# Patient Record
Sex: Female | Born: 1955
Health system: Southern US, Community
[De-identification: ages and names within clinical notes are randomized; demographics above are authoritative.]

## PROBLEM LIST (undated history)

## (undated) DIAGNOSIS — G47419 Narcolepsy without cataplexy: Secondary | ICD-10-CM

## (undated) DIAGNOSIS — D259 Leiomyoma of uterus, unspecified: Secondary | ICD-10-CM

## (undated) DIAGNOSIS — I5032 Chronic diastolic (congestive) heart failure: Secondary | ICD-10-CM

## (undated) DIAGNOSIS — M1990 Unspecified osteoarthritis, unspecified site: Secondary | ICD-10-CM

## (undated) DIAGNOSIS — K219 Gastro-esophageal reflux disease without esophagitis: Secondary | ICD-10-CM

## (undated) DIAGNOSIS — E785 Hyperlipidemia, unspecified: Secondary | ICD-10-CM

## (undated) DIAGNOSIS — J40 Bronchitis, not specified as acute or chronic: Secondary | ICD-10-CM

## (undated) DIAGNOSIS — R1032 Left lower quadrant pain: Secondary | ICD-10-CM

## (undated) DIAGNOSIS — I10 Essential (primary) hypertension: Secondary | ICD-10-CM

## (undated) DIAGNOSIS — J302 Other seasonal allergic rhinitis: Secondary | ICD-10-CM

## (undated) DIAGNOSIS — I493 Ventricular premature depolarization: Secondary | ICD-10-CM

## (undated) DIAGNOSIS — I491 Atrial premature depolarization: Secondary | ICD-10-CM

## (undated) DIAGNOSIS — R4189 Other symptoms and signs involving cognitive functions and awareness: Secondary | ICD-10-CM

## (undated) DIAGNOSIS — F419 Anxiety disorder, unspecified: Secondary | ICD-10-CM

## (undated) DIAGNOSIS — M8580 Other specified disorders of bone density and structure, unspecified site: Secondary | ICD-10-CM

## (undated) DIAGNOSIS — IMO0002 Reserved for concepts with insufficient information to code with codable children: Secondary | ICD-10-CM

## (undated) HISTORY — DX: Left lower quadrant pain: R10.32

## (undated) HISTORY — DX: Other symptoms and signs involving cognitive functions and awareness: R41.89

## (undated) HISTORY — DX: Ventricular premature depolarization: I49.3

## (undated) HISTORY — DX: Hyperlipidemia, unspecified: E78.5

## (undated) HISTORY — DX: Essential (primary) hypertension: I10

## (undated) HISTORY — DX: Narcolepsy without cataplexy: G47.419

## (undated) HISTORY — PX: APPENDECTOMY: SHX54

## (undated) HISTORY — DX: Atrial premature depolarization: I49.1

## (undated) HISTORY — PX: COLONOSCOPY: SHX174

## (undated) HISTORY — DX: Reserved for concepts with insufficient information to code with codable children: IMO0002

## (undated) HISTORY — PX: WISDOM TOOTH EXTRACTION: SHX21

## (undated) HISTORY — DX: Leiomyoma of uterus, unspecified: D25.9

## (undated) HISTORY — PX: TUBAL LIGATION: SHX77

---
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
## 1998-01-12 ENCOUNTER — Encounter: Admission: RE | Admit: 1998-01-12 | Discharge: 1998-01-12 | Payer: Self-pay | Admitting: Family Medicine

## 1998-04-29 ENCOUNTER — Encounter: Admission: RE | Admit: 1998-04-29 | Discharge: 1998-04-29 | Payer: Self-pay | Admitting: Family Medicine

## 1999-06-06 ENCOUNTER — Encounter: Admission: RE | Admit: 1999-06-06 | Discharge: 1999-06-06 | Payer: Self-pay | Admitting: Sports Medicine

## 2000-01-24 ENCOUNTER — Encounter: Admission: RE | Admit: 2000-01-24 | Discharge: 2000-01-24 | Payer: Self-pay | Admitting: Family Medicine

## 2000-02-08 ENCOUNTER — Encounter: Admission: RE | Admit: 2000-02-08 | Discharge: 2000-02-08 | Payer: Self-pay | Admitting: Family Medicine

## 2000-02-16 ENCOUNTER — Encounter: Admission: RE | Admit: 2000-02-16 | Discharge: 2000-05-16 | Payer: Self-pay | Admitting: *Deleted

## 2000-05-22 ENCOUNTER — Encounter: Admission: RE | Admit: 2000-05-22 | Discharge: 2000-05-22 | Payer: Self-pay | Admitting: Family Medicine

## 2000-07-03 ENCOUNTER — Encounter: Payer: Self-pay | Admitting: Sports Medicine

## 2000-07-03 ENCOUNTER — Encounter: Admission: RE | Admit: 2000-07-03 | Discharge: 2000-07-03 | Payer: Self-pay | Admitting: Sports Medicine

## 2000-12-14 ENCOUNTER — Emergency Department (HOSPITAL_COMMUNITY): Admission: EM | Admit: 2000-12-14 | Discharge: 2000-12-14 | Payer: Self-pay | Admitting: *Deleted

## 2000-12-26 ENCOUNTER — Emergency Department (HOSPITAL_COMMUNITY): Admission: EM | Admit: 2000-12-26 | Discharge: 2000-12-26 | Payer: Self-pay | Admitting: Emergency Medicine

## 2001-01-08 ENCOUNTER — Encounter: Admission: RE | Admit: 2001-01-08 | Discharge: 2001-01-08 | Payer: Self-pay | Admitting: Family Medicine

## 2001-01-22 ENCOUNTER — Encounter: Admission: RE | Admit: 2001-01-22 | Discharge: 2001-01-22 | Payer: Self-pay | Admitting: Family Medicine

## 2001-07-22 ENCOUNTER — Encounter: Admission: RE | Admit: 2001-07-22 | Discharge: 2001-07-22 | Payer: Self-pay | Admitting: Family Medicine

## 2003-11-05 ENCOUNTER — Emergency Department (HOSPITAL_COMMUNITY): Admission: EM | Admit: 2003-11-05 | Discharge: 2003-11-05 | Payer: Self-pay | Admitting: Emergency Medicine

## 2003-11-09 ENCOUNTER — Encounter: Admission: RE | Admit: 2003-11-09 | Discharge: 2003-11-09 | Payer: Self-pay | Admitting: Family Medicine

## 2003-11-11 ENCOUNTER — Encounter: Admission: RE | Admit: 2003-11-11 | Discharge: 2003-11-11 | Payer: Self-pay | Admitting: Family Medicine

## 2003-11-11 ENCOUNTER — Ambulatory Visit (HOSPITAL_COMMUNITY): Admission: RE | Admit: 2003-11-11 | Discharge: 2003-11-11 | Payer: Self-pay | Admitting: Family Medicine

## 2003-11-22 ENCOUNTER — Encounter: Admission: RE | Admit: 2003-11-22 | Discharge: 2003-11-22 | Payer: Self-pay | Admitting: Family Medicine

## 2003-12-20 ENCOUNTER — Emergency Department (HOSPITAL_COMMUNITY): Admission: EM | Admit: 2003-12-20 | Discharge: 2003-12-20 | Payer: Self-pay | Admitting: Family Medicine

## 2003-12-21 ENCOUNTER — Encounter: Admission: RE | Admit: 2003-12-21 | Discharge: 2003-12-21 | Payer: Self-pay | Admitting: Sports Medicine

## 2004-04-13 ENCOUNTER — Encounter: Admission: RE | Admit: 2004-04-13 | Discharge: 2004-04-13 | Payer: Self-pay | Admitting: Sports Medicine

## 2005-07-02 ENCOUNTER — Ambulatory Visit: Payer: Self-pay | Admitting: Family Medicine

## 2005-07-02 ENCOUNTER — Encounter (INDEPENDENT_AMBULATORY_CARE_PROVIDER_SITE_OTHER): Payer: Self-pay | Admitting: *Deleted

## 2005-07-26 ENCOUNTER — Encounter: Admission: RE | Admit: 2005-07-26 | Discharge: 2005-07-26 | Payer: Self-pay | Admitting: Sports Medicine

## 2005-08-13 ENCOUNTER — Ambulatory Visit: Payer: Self-pay | Admitting: Psychology

## 2005-08-13 ENCOUNTER — Encounter: Admission: RE | Admit: 2005-08-13 | Discharge: 2005-11-11 | Payer: Self-pay | Admitting: Family Medicine

## 2005-09-06 ENCOUNTER — Ambulatory Visit: Payer: Self-pay | Admitting: Family Medicine

## 2005-10-03 ENCOUNTER — Ambulatory Visit: Payer: Self-pay | Admitting: Family Medicine

## 2005-12-25 ENCOUNTER — Encounter (INDEPENDENT_AMBULATORY_CARE_PROVIDER_SITE_OTHER): Payer: Self-pay | Admitting: *Deleted

## 2006-01-11 ENCOUNTER — Ambulatory Visit: Payer: Self-pay | Admitting: Family Medicine

## 2006-01-11 ENCOUNTER — Encounter (INDEPENDENT_AMBULATORY_CARE_PROVIDER_SITE_OTHER): Payer: Self-pay | Admitting: Specialist

## 2006-08-01 ENCOUNTER — Ambulatory Visit (HOSPITAL_COMMUNITY): Admission: RE | Admit: 2006-08-01 | Discharge: 2006-08-01 | Payer: Self-pay | Admitting: Family Medicine

## 2006-08-01 ENCOUNTER — Ambulatory Visit: Payer: Self-pay | Admitting: Family Medicine

## 2006-08-08 ENCOUNTER — Encounter: Admission: RE | Admit: 2006-08-08 | Discharge: 2006-08-08 | Payer: Self-pay | Admitting: Family Medicine

## 2006-09-02 ENCOUNTER — Ambulatory Visit: Payer: Self-pay | Admitting: Sports Medicine

## 2006-09-10 ENCOUNTER — Ambulatory Visit: Payer: Self-pay | Admitting: Family Medicine

## 2006-10-11 ENCOUNTER — Emergency Department (HOSPITAL_COMMUNITY): Admission: EM | Admit: 2006-10-11 | Discharge: 2006-10-12 | Payer: Self-pay | Admitting: Emergency Medicine

## 2006-10-12 ENCOUNTER — Ambulatory Visit (HOSPITAL_COMMUNITY): Admission: RE | Admit: 2006-10-12 | Discharge: 2006-10-12 | Payer: Self-pay | Admitting: Emergency Medicine

## 2006-10-12 ENCOUNTER — Ambulatory Visit: Payer: Self-pay | Admitting: Vascular Surgery

## 2006-10-14 ENCOUNTER — Ambulatory Visit: Payer: Self-pay | Admitting: Family Medicine

## 2006-10-25 ENCOUNTER — Encounter (INDEPENDENT_AMBULATORY_CARE_PROVIDER_SITE_OTHER): Payer: Self-pay | Admitting: *Deleted

## 2007-01-21 ENCOUNTER — Ambulatory Visit: Payer: Self-pay | Admitting: Sports Medicine

## 2007-01-23 ENCOUNTER — Ambulatory Visit: Payer: Self-pay | Admitting: Family Medicine

## 2007-06-16 ENCOUNTER — Telehealth: Payer: Self-pay | Admitting: *Deleted

## 2007-06-18 ENCOUNTER — Ambulatory Visit: Payer: Self-pay | Admitting: Family Medicine

## 2007-06-19 ENCOUNTER — Ambulatory Visit: Payer: Self-pay | Admitting: *Deleted

## 2007-08-29 ENCOUNTER — Encounter: Payer: Self-pay | Admitting: Family Medicine

## 2007-09-05 ENCOUNTER — Encounter: Payer: Self-pay | Admitting: *Deleted

## 2007-09-25 ENCOUNTER — Ambulatory Visit: Payer: Self-pay | Admitting: Internal Medicine

## 2007-09-25 ENCOUNTER — Encounter: Payer: Self-pay | Admitting: Family Medicine

## 2007-09-25 DIAGNOSIS — I10 Essential (primary) hypertension: Secondary | ICD-10-CM | POA: Insufficient documentation

## 2007-09-25 DIAGNOSIS — F70 Mild intellectual disabilities: Secondary | ICD-10-CM | POA: Insufficient documentation

## 2007-09-29 LAB — CONVERTED CEMR LAB
BUN: 21 mg/dL (ref 6–23)
Chloride: 101 meq/L (ref 96–112)
Potassium: 3 meq/L — ABNORMAL LOW (ref 3.5–5.3)

## 2007-10-10 ENCOUNTER — Encounter: Payer: Self-pay | Admitting: Family Medicine

## 2007-10-10 ENCOUNTER — Ambulatory Visit: Payer: Self-pay | Admitting: Family Medicine

## 2007-10-13 LAB — CONVERTED CEMR LAB
BUN: 17 mg/dL (ref 6–23)
Calcium: 9.6 mg/dL (ref 8.4–10.5)
Creatinine, Ser: 0.75 mg/dL (ref 0.40–1.20)

## 2007-10-14 ENCOUNTER — Encounter: Admission: RE | Admit: 2007-10-14 | Discharge: 2007-10-14 | Payer: Self-pay | Admitting: Family Medicine

## 2007-10-28 ENCOUNTER — Ambulatory Visit: Payer: Self-pay | Admitting: Family Medicine

## 2007-10-28 ENCOUNTER — Encounter: Payer: Self-pay | Admitting: Family Medicine

## 2007-10-31 ENCOUNTER — Encounter: Payer: Self-pay | Admitting: Family Medicine

## 2007-11-01 ENCOUNTER — Encounter: Payer: Self-pay | Admitting: Family Medicine

## 2007-11-03 LAB — CONVERTED CEMR LAB
CO2: 30 meq/L (ref 19–32)
Chloride: 100 meq/L (ref 96–112)
Glucose, Bld: 72 mg/dL (ref 70–99)
Potassium: 3.4 meq/L — ABNORMAL LOW (ref 3.5–5.3)
Sodium: 142 meq/L (ref 135–145)

## 2008-07-27 ENCOUNTER — Telehealth: Payer: Self-pay | Admitting: *Deleted

## 2008-08-02 ENCOUNTER — Telehealth: Payer: Self-pay | Admitting: *Deleted

## 2008-08-04 ENCOUNTER — Emergency Department (HOSPITAL_COMMUNITY): Admission: EM | Admit: 2008-08-04 | Discharge: 2008-08-04 | Payer: Self-pay | Admitting: Emergency Medicine

## 2008-08-04 ENCOUNTER — Telehealth: Payer: Self-pay | Admitting: *Deleted

## 2008-08-09 ENCOUNTER — Ambulatory Visit: Payer: Self-pay | Admitting: Family Medicine

## 2008-08-09 ENCOUNTER — Encounter: Payer: Self-pay | Admitting: Family Medicine

## 2008-08-09 DIAGNOSIS — K589 Irritable bowel syndrome without diarrhea: Secondary | ICD-10-CM | POA: Insufficient documentation

## 2008-08-09 LAB — CONVERTED CEMR LAB: Direct LDL: 136 mg/dL — ABNORMAL HIGH

## 2008-08-11 ENCOUNTER — Encounter: Payer: Self-pay | Admitting: Family Medicine

## 2008-08-26 ENCOUNTER — Telehealth: Payer: Self-pay | Admitting: *Deleted

## 2008-08-27 HISTORY — PX: HYSTEROSCOPY W/ ENDOMETRIAL ABLATION: SUR665

## 2008-08-27 HISTORY — PX: DILATION AND CURETTAGE OF UTERUS: SHX78

## 2008-09-06 ENCOUNTER — Telehealth: Payer: Self-pay | Admitting: *Deleted

## 2008-09-07 ENCOUNTER — Ambulatory Visit: Payer: Self-pay | Admitting: Family Medicine

## 2008-09-28 ENCOUNTER — Encounter: Payer: Self-pay | Admitting: Family Medicine

## 2008-09-28 ENCOUNTER — Ambulatory Visit: Payer: Self-pay | Admitting: Family Medicine

## 2008-09-30 ENCOUNTER — Encounter: Admission: RE | Admit: 2008-09-30 | Discharge: 2008-09-30 | Payer: Self-pay | Admitting: Family Medicine

## 2008-10-08 ENCOUNTER — Encounter: Payer: Self-pay | Admitting: Family Medicine

## 2008-10-13 ENCOUNTER — Encounter: Payer: Self-pay | Admitting: Family Medicine

## 2008-10-14 ENCOUNTER — Telehealth: Payer: Self-pay | Admitting: *Deleted

## 2008-10-14 ENCOUNTER — Encounter: Admission: RE | Admit: 2008-10-14 | Discharge: 2008-10-14 | Payer: Self-pay | Admitting: Family Medicine

## 2008-11-24 ENCOUNTER — Encounter: Payer: Self-pay | Admitting: Physician Assistant

## 2008-11-24 ENCOUNTER — Ambulatory Visit: Payer: Self-pay | Admitting: Obstetrics & Gynecology

## 2008-11-24 ENCOUNTER — Other Ambulatory Visit: Admission: RE | Admit: 2008-11-24 | Discharge: 2008-11-24 | Payer: Self-pay | Admitting: Obstetrics & Gynecology

## 2008-11-24 ENCOUNTER — Encounter: Payer: Self-pay | Admitting: Family Medicine

## 2008-11-24 LAB — CONVERTED CEMR LAB
HCT: 39.5 % (ref 36.0–46.0)
MCHC: 31.9 g/dL (ref 30.0–36.0)
MCV: 89.6 fL (ref 78.0–100.0)
Platelets: 290 10*3/uL (ref 150–400)
Trich, Wet Prep: NONE SEEN
WBC: 5.3 10*3/uL (ref 4.0–10.5)

## 2008-12-22 ENCOUNTER — Ambulatory Visit: Payer: Self-pay | Admitting: Obstetrics & Gynecology

## 2008-12-23 ENCOUNTER — Encounter: Payer: Self-pay | Admitting: Obstetrics & Gynecology

## 2008-12-23 LAB — CONVERTED CEMR LAB: FSH: 62.1 milliintl units/mL

## 2008-12-27 ENCOUNTER — Emergency Department (HOSPITAL_COMMUNITY): Admission: EM | Admit: 2008-12-27 | Discharge: 2008-12-27 | Payer: Self-pay | Admitting: Family Medicine

## 2009-01-04 ENCOUNTER — Telehealth (INDEPENDENT_AMBULATORY_CARE_PROVIDER_SITE_OTHER): Payer: Self-pay | Admitting: *Deleted

## 2009-01-11 ENCOUNTER — Encounter: Payer: Self-pay | Admitting: Obstetrics & Gynecology

## 2009-01-11 ENCOUNTER — Ambulatory Visit (HOSPITAL_COMMUNITY): Admission: RE | Admit: 2009-01-11 | Discharge: 2009-01-11 | Payer: Self-pay | Admitting: Obstetrics & Gynecology

## 2009-01-11 ENCOUNTER — Ambulatory Visit: Payer: Self-pay | Admitting: Obstetrics & Gynecology

## 2009-01-12 ENCOUNTER — Encounter: Payer: Self-pay | Admitting: Family Medicine

## 2009-01-13 ENCOUNTER — Inpatient Hospital Stay (HOSPITAL_COMMUNITY): Admission: AD | Admit: 2009-01-13 | Discharge: 2009-01-13 | Payer: Self-pay | Admitting: Obstetrics & Gynecology

## 2009-01-13 ENCOUNTER — Encounter: Payer: Self-pay | Admitting: Family Medicine

## 2009-01-26 ENCOUNTER — Ambulatory Visit: Payer: Self-pay | Admitting: Obstetrics and Gynecology

## 2009-01-26 ENCOUNTER — Encounter: Payer: Self-pay | Admitting: Obstetrics & Gynecology

## 2009-01-26 LAB — CONVERTED CEMR LAB
GC Probe Amp, Genital: NEGATIVE
Hemoglobin: 13.2 g/dL (ref 12.0–15.0)
MCHC: 32.3 g/dL (ref 30.0–36.0)
MCV: 89.5 fL (ref 78.0–100.0)
RBC: 4.57 M/uL (ref 3.87–5.11)

## 2009-01-27 ENCOUNTER — Encounter: Payer: Self-pay | Admitting: Obstetrics & Gynecology

## 2009-01-27 LAB — CONVERTED CEMR LAB

## 2009-02-02 ENCOUNTER — Ambulatory Visit: Payer: Self-pay | Admitting: Obstetrics and Gynecology

## 2009-02-25 ENCOUNTER — Ambulatory Visit: Payer: Self-pay | Admitting: Family Medicine

## 2009-02-25 ENCOUNTER — Encounter (INDEPENDENT_AMBULATORY_CARE_PROVIDER_SITE_OTHER): Payer: Self-pay | Admitting: *Deleted

## 2009-03-01 ENCOUNTER — Ambulatory Visit: Payer: Self-pay | Admitting: Sports Medicine

## 2009-03-01 ENCOUNTER — Encounter: Payer: Self-pay | Admitting: Family Medicine

## 2009-03-01 DIAGNOSIS — M75100 Unspecified rotator cuff tear or rupture of unspecified shoulder, not specified as traumatic: Secondary | ICD-10-CM | POA: Insufficient documentation

## 2009-03-01 DIAGNOSIS — IMO0002 Reserved for concepts with insufficient information to code with codable children: Secondary | ICD-10-CM | POA: Insufficient documentation

## 2009-03-07 ENCOUNTER — Telehealth: Payer: Self-pay | Admitting: Sports Medicine

## 2009-03-08 ENCOUNTER — Encounter: Payer: Self-pay | Admitting: Family Medicine

## 2009-03-08 ENCOUNTER — Ambulatory Visit: Payer: Self-pay | Admitting: Sports Medicine

## 2009-03-18 ENCOUNTER — Encounter: Payer: Self-pay | Admitting: Family Medicine

## 2009-03-21 ENCOUNTER — Ambulatory Visit: Payer: Self-pay | Admitting: Sports Medicine

## 2009-03-21 ENCOUNTER — Encounter: Payer: Self-pay | Admitting: Family Medicine

## 2009-03-23 ENCOUNTER — Encounter: Admission: RE | Admit: 2009-03-23 | Discharge: 2009-06-21 | Payer: Self-pay | Admitting: Family Medicine

## 2009-03-24 ENCOUNTER — Encounter (INDEPENDENT_AMBULATORY_CARE_PROVIDER_SITE_OTHER): Payer: Self-pay | Admitting: *Deleted

## 2009-03-24 ENCOUNTER — Telehealth (INDEPENDENT_AMBULATORY_CARE_PROVIDER_SITE_OTHER): Payer: Self-pay | Admitting: *Deleted

## 2009-04-07 ENCOUNTER — Encounter (INDEPENDENT_AMBULATORY_CARE_PROVIDER_SITE_OTHER): Payer: Self-pay | Admitting: *Deleted

## 2009-04-18 ENCOUNTER — Ambulatory Visit: Payer: Self-pay | Admitting: Sports Medicine

## 2009-05-03 ENCOUNTER — Ambulatory Visit: Payer: Self-pay | Admitting: Sports Medicine

## 2009-05-04 ENCOUNTER — Encounter (INDEPENDENT_AMBULATORY_CARE_PROVIDER_SITE_OTHER): Payer: Self-pay | Admitting: *Deleted

## 2009-05-18 ENCOUNTER — Ambulatory Visit: Payer: Self-pay | Admitting: Sports Medicine

## 2009-05-18 ENCOUNTER — Encounter (INDEPENDENT_AMBULATORY_CARE_PROVIDER_SITE_OTHER): Payer: Self-pay | Admitting: *Deleted

## 2009-05-18 DIAGNOSIS — M7520 Bicipital tendinitis, unspecified shoulder: Secondary | ICD-10-CM | POA: Insufficient documentation

## 2009-05-25 ENCOUNTER — Encounter: Payer: Self-pay | Admitting: Sports Medicine

## 2009-05-27 ENCOUNTER — Telehealth: Payer: Self-pay | Admitting: Family Medicine

## 2009-06-03 ENCOUNTER — Encounter: Payer: Self-pay | Admitting: *Deleted

## 2009-06-06 ENCOUNTER — Encounter: Payer: Self-pay | Admitting: Family Medicine

## 2009-06-06 ENCOUNTER — Ambulatory Visit: Payer: Self-pay | Admitting: Sports Medicine

## 2009-06-06 ENCOUNTER — Ambulatory Visit: Payer: Self-pay | Admitting: Family Medicine

## 2009-06-07 ENCOUNTER — Encounter: Payer: Self-pay | Admitting: *Deleted

## 2009-06-08 ENCOUNTER — Ambulatory Visit: Payer: Self-pay | Admitting: Family Medicine

## 2009-06-08 ENCOUNTER — Encounter: Payer: Self-pay | Admitting: Family Medicine

## 2009-06-08 LAB — CONVERTED CEMR LAB
BUN: 20 mg/dL (ref 6–23)
Calcium: 9.9 mg/dL (ref 8.4–10.5)
Cholesterol: 246 mg/dL — ABNORMAL HIGH (ref 0–200)
Glucose, Bld: 81 mg/dL (ref 70–99)
LDL Cholesterol: 167 mg/dL — ABNORMAL HIGH (ref 0–99)
Total CHOL/HDL Ratio: 4.2
VLDL: 20 mg/dL (ref 0–40)

## 2009-06-09 ENCOUNTER — Telehealth: Payer: Self-pay | Admitting: Family Medicine

## 2009-06-20 ENCOUNTER — Encounter: Payer: Self-pay | Admitting: Family Medicine

## 2009-06-22 ENCOUNTER — Encounter: Payer: Self-pay | Admitting: Family Medicine

## 2009-06-23 ENCOUNTER — Encounter: Payer: Self-pay | Admitting: Family Medicine

## 2009-07-02 ENCOUNTER — Encounter: Payer: Self-pay | Admitting: Family Medicine

## 2009-07-02 DIAGNOSIS — D259 Leiomyoma of uterus, unspecified: Secondary | ICD-10-CM | POA: Insufficient documentation

## 2009-07-02 HISTORY — DX: Leiomyoma of uterus, unspecified: D25.9

## 2009-07-13 ENCOUNTER — Ambulatory Visit: Payer: Self-pay | Admitting: Family Medicine

## 2009-07-13 DIAGNOSIS — E7800 Pure hypercholesterolemia, unspecified: Secondary | ICD-10-CM | POA: Insufficient documentation

## 2009-07-13 DIAGNOSIS — E663 Overweight: Secondary | ICD-10-CM | POA: Insufficient documentation

## 2009-08-16 ENCOUNTER — Ambulatory Visit: Payer: Self-pay | Admitting: Family Medicine

## 2009-08-22 ENCOUNTER — Emergency Department (HOSPITAL_COMMUNITY): Admission: EM | Admit: 2009-08-22 | Discharge: 2009-08-22 | Payer: Self-pay | Admitting: Family Medicine

## 2009-08-27 DIAGNOSIS — IMO0002 Reserved for concepts with insufficient information to code with codable children: Secondary | ICD-10-CM

## 2009-08-27 DIAGNOSIS — R87619 Unspecified abnormal cytological findings in specimens from cervix uteri: Secondary | ICD-10-CM

## 2009-08-27 HISTORY — DX: Reserved for concepts with insufficient information to code with codable children: IMO0002

## 2009-08-27 HISTORY — DX: Unspecified abnormal cytological findings in specimens from cervix uteri: R87.619

## 2009-09-09 ENCOUNTER — Ambulatory Visit (HOSPITAL_COMMUNITY): Admission: RE | Admit: 2009-09-09 | Discharge: 2009-09-09 | Payer: Self-pay | Admitting: Gastroenterology

## 2009-10-17 ENCOUNTER — Encounter: Admission: RE | Admit: 2009-10-17 | Discharge: 2009-10-17 | Payer: Self-pay | Admitting: Family Medicine

## 2009-11-25 ENCOUNTER — Encounter: Payer: Self-pay | Admitting: Family Medicine

## 2010-01-25 ENCOUNTER — Telehealth: Payer: Self-pay | Admitting: *Deleted

## 2010-02-03 ENCOUNTER — Ambulatory Visit: Payer: Self-pay | Admitting: Obstetrics & Gynecology

## 2010-02-03 LAB — CONVERTED CEMR LAB: Pap Smear: NEGATIVE

## 2010-05-08 ENCOUNTER — Ambulatory Visit: Payer: Self-pay | Admitting: Family Medicine

## 2010-08-10 ENCOUNTER — Encounter: Payer: Self-pay | Admitting: Family Medicine

## 2010-09-01 ENCOUNTER — Ambulatory Visit: Admission: RE | Admit: 2010-09-01 | Discharge: 2010-09-01 | Payer: Self-pay | Source: Home / Self Care

## 2010-09-06 ENCOUNTER — Encounter: Payer: Self-pay | Admitting: Family Medicine

## 2010-09-06 ENCOUNTER — Ambulatory Visit: Admission: RE | Admit: 2010-09-06 | Discharge: 2010-09-06 | Payer: Self-pay | Source: Home / Self Care

## 2010-09-11 ENCOUNTER — Encounter: Payer: Self-pay | Admitting: *Deleted

## 2010-09-20 ENCOUNTER — Telehealth: Payer: Self-pay | Admitting: Family Medicine

## 2010-09-20 LAB — CONVERTED CEMR LAB
CO2: 32 meq/L (ref 19–32)
Creatinine, Ser: 0.66 mg/dL (ref 0.40–1.20)
Glucose, Bld: 89 mg/dL (ref 70–99)
HCT: 37.4 % (ref 36.0–46.0)
LDL Cholesterol: 112 mg/dL — ABNORMAL HIGH (ref 0–99)
Platelets: 263 10*3/uL (ref 150–400)
RBC: 3.99 M/uL (ref 3.87–5.11)
Total Bilirubin: 0.4 mg/dL (ref 0.3–1.2)
WBC: 4.6 10*3/uL (ref 4.0–10.5)

## 2010-09-21 ENCOUNTER — Other Ambulatory Visit: Payer: Self-pay | Admitting: Family Medicine

## 2010-09-21 DIAGNOSIS — Z1239 Encounter for other screening for malignant neoplasm of breast: Secondary | ICD-10-CM

## 2010-09-22 ENCOUNTER — Ambulatory Visit
Admission: RE | Admit: 2010-09-22 | Discharge: 2010-09-22 | Payer: Self-pay | Source: Home / Self Care | Attending: Occupational Medicine | Admitting: Occupational Medicine

## 2010-09-26 NOTE — Assessment & Plan Note (Signed)
Summary: W/C F/U [facility]   Vital Signs:  Patient profile:   55 year old female Pulse rate:   60 / minute BP sitting:   122 / 79  (left arm)  Vitals Entered By: Terese Door (May 08, 2010 2:21 PM) CC: W/C  F/u  Left shoulder pain   CC:  W/C  F/u  Left shoulder pain.  Allergies: 1)  ! Pcn  Physical Exam  Msk:  B shoulders FROM. Rotator cuff strength intact in all planes. Negative hawkins and Neerrs B.  Left shoulder mild TTP over her biceps tendon but intact with no deficit in  strength, no defect in tendon. Distally neurovascularly intact B.   Impression & Recommendations:  Problem # 1:  BICEPS TENDINITIS, LEFT (ICD-726.12) somemild continued biceps tendonitis. Clinical picture consistent with occasional recurrence rotator cuff syndrome (subacromial bursitis)---we offered subacromial injection which she did not want. Emphasized return to her exercise program 2-3 times per week likely beneficial. Her work rating remians 100%.  Complete Medication List: 1)  Hydrochlorothiazide 25 Mg Tabs (Hydrochlorothiazide) .... Take 1 tablet by mouth once a day. please schedule appointment with doctor for more refills. 2)  Klor-con 10 10 Meq Tbcr (Potassium chloride) .... 2 tabs by mouth daily 3)  Ibuprofen 600 Mg Tabs (Ibuprofen) .Marland Kitchen.. 1 tab by mouth q6h as needed pain 4)  Loratadine 10 Mg Tabs (Loratadine) .Marland Kitchen.. 1 by mouth daily 5)  Voltaren 1 % Gel (Diclofenac sodium) .Marland Kitchen.. 1 gram to affected area qid 6)  Simvastatin 40 Mg Tabs (Simvastatin) .Marland Kitchen.. 1 tab by mouth daily

## 2010-09-26 NOTE — Progress Notes (Signed)
Summary: Question re: disability  Phone Note Call from Patient Call back at Home Phone [phone]   Reason for Call: Talk to Nurse Summary of Call: pt is requesting to speak with MD about her "disability rating" Initial call taken by: Knox Royalty,  January 25, 2010 2:07 PM  Follow-up for Phone Call        called pt and advised to sched. OV to be re-evaluated by Dr.Ta. Unable to give disability rating w/out being seen/evaluated. Pt received disablility checks last year. See last OV. released back to work in 10-11. Pt agreed and will sched. OV with Dr.Ta.  Follow-up by: Arlyss Repress CMA,,  January 25, 2010 2:17 PM

## 2010-09-26 NOTE — Miscellaneous (Signed)
Summary: Colonoscopy result  Clinical Lists Changes  Observations: Added new observation of PAST SURG HX: Appendectomy Tubal ligation D&C hysteroscopy, endometrial ablation for AUB (01/08/09 by [facility]) Colonoscopy 08/2009: Redundant Colon, otherwise NORMAL (11/25/2009 9:48) Added new observation of COLONNXTDUE: 08/2014 (11/25/2009 9:48) Added new observation of COLONRECACT: Repeat colonoscopy in 5 years.  (09/09/2009 9:49) Added new observation of COLONOSCOPY:  Results:  Very redundant colon. Otherwiese Normal to terminal ileum. Location:  [facility].   (09/09/2009 9:49)      Colonoscopy  Procedure date:  09/09/2009  Findings:       Results:  Very redundant colon. Otherwiese Normal to terminal ileum. Location:  [facility].    Comments:      Repeat colonoscopy in 5 years.   Procedures Next Due Date:    Colonoscopy: 08/2014   Past History:  Past Surgical History: Appendectomy Tubal ligation D&C hysteroscopy, endometrial ablation for AUB (01/08/09 by [facility]) Colonoscopy 08/2009: Redundant Colon, otherwise NORMAL

## 2010-09-28 NOTE — Miscellaneous (Signed)
Summary: re: form/TS  pt walked into clinic requesting form from Dr.Ta. pt said, that Dr.Ta told her to pick up her form for work today. I did not see any forms up front or in Dr.Ta's box. Pt said, that she gave Dr.Ta the form at her last OV. I told the pt, that we would call her, once we find out from Dr.Ta what happened to her form. fwd. to Dr.Ta P.S. We can leave a message on pt's answering machine [phone]  Arlyss Repress CMA,  September 11, 2010 5:08 PM      I placed the form in the "to be mail" slot last week.  Cat Ta MD  September 11, 2010 6:13 PM called pt and lmvm that form was mailed.Arlyss Repress CMA,  September 12, 2010 8:05AM

## 2010-09-28 NOTE — Assessment & Plan Note (Signed)
Summary: F/U CHOLESTEROL AND BP/FMLA FORMS/[facility]   Vital Signs:  Patient profile:   55 year old female Height:      65 inches Weight:      158.3 pounds BMI:     26.44 Pulse rate:   73 / minute BP sitting:   126 / 77  (right arm)  Vitals Entered By: Arlyss Repress CMA, (September 01, 2010 2:59 PM) CC: f/up cholesterol and HTN. Pain Assessment Patient in pain? yes     Location: left shoulder Intensity: 6 Onset of pain  injury at job at [employer]    Primary Care Provider:  Aubery Date MD  CC:  f/up cholesterol and HTN.Marland Kitchen  History of Present Illness: 55 y/o F with HTN, HLD, and chronic shoulder pain is here for routine follow-up  LEFT SHOULDER PAIN History of L subacromial bursitis and bicep tenditis: pain sometimes keep her up at night.  Pt has recurrence of pain sometimes.  Sometimes pain is so bad that she cannot go into work.  She takes Ibuprofen 200mg  x 3 each time when pain comes.  She also uses Voltaren gel.  These meds help sometimes but when flare up occurs she cannot tolerate the pain.  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  Pt has not been using the exercises taught to her by PT.  She has bands at home for these exercises, but has not been using them.    HYPERTENSION Disease Monitoring Blood pressure range: 110s-120s/70s-80s Medications: HCTZ 25mg  Compliance: yes  Lightheadedness: no  Edema:no  Chest pain: no  Dyspnea:after walking up stairs (after 3rd floor) Prevention Exercise: yes  Salt restriction:yes (eating frozen vegetables vs canned)  HYPERLIPIDEMIA: GNF:[id number] 40mg   Compliance:yes   Prob taking med:no Muscle aches:no Abd  pain:no  OVERWEIGHT BMI: 26.4  Weight difference since last OV: -10 lbs  Exercise:  No routine exercise, but walking up stairs when she works, parking car further away in parking lot        Diet: eating more vegetables, eating less fry food    Habits & Providers  Alcohol-Tobacco-Diet     Tobacco Status: never  Current Medications (verified): 1)  Hydrochlorothiazide 25 Mg Tabs (Hydrochlorothiazide) .... Take 1 Tablet By Mouth Once A Day. Please Schedule Appointment With Doctor For More Refills. 2)  Klor-Con 10 10 Meq  Tbcr (Potassium Chloride) .... 2 Tabs By Mouth Daily 3)  Loratadine 10 Mg Tabs (Loratadine) .Marland Kitchen.. 1 By Mouth Daily 4)  Voltaren 1 % Gel (Diclofenac Sodium) .Marland Kitchen.. 1 Gram To Affected Area Qid 5)  Simvastatin 40 Mg Tabs (Simvastatin) .Marland Kitchen.. 1 Tab By Mouth Daily 6)  Aspir-Low 81 Mg Tbec (Aspirin) .Marland Kitchen.. 1 Tab By Mouth Daily  Allergies (verified): 1)  ! Pcn  Past History:  Past Medical History: Last updated: 07/02/2009 G6P3 HTN Allergic Rhinitis Arthritis Pelvic ultrasound 2010: multiple uterine fibroids with largest 5.8 x 3.4 x 4.4cm, total 7 fibroids Endometrial Bx 01/12/09: benign endocervical polyp; no dysplasia or malignancy. ECC 01/12/09:  benign weakly prolif endometrium, no atypia, hyperplasia, or malignancy  Past Surgical History: Last updated: 11/25/2009 Appendectomy Tubal ligation D&C hysteroscopy, endometrial ablation for AUB (01/08/09 by [facility]) Colonoscopy 08/2009: Redundant Colon, otherwise NORMAL  Family History: Last updated: 06-10-09 brother -- died with throat Ca, htn & dm in mult family members, mother -- dm,  CVA, CABG at age 73 Mat uncle -- colon CA at age 36s No BRCA No GYN cancer Mat grandma: obesity  Social History: Last updated: 09/25/2007 Divorced. 3 children.  Nonsmoker.  No etoh.  Worked at [employer].  Started new job on 11/08 at [employer] in [place] and doing well - had trouble getting job due to mild mentally handicapped.  Risk Factors: Alcohol Use: 0 (06/06/2009) Exercise: no (06/06/2009)  Risk Factors: Smoking Status: never (09/01/2010)  Review of Systems       per hpi   Physical Exam  General:  Well-developed,well-nourished,in no acute distress; alert,appropriate and cooperative throughout  examination. Vitals reviewed Mouth:  Oral mucosa and oropharynx without lesions or exudates.  Teeth in good repair. Neck:  No deformities, masses, or tenderness noted. Lungs:  Normal respiratory effort, chest expands symmetrically. Lungs are clear to auscultation, no crackles or wheezes. Heart:  Normal rate and regular rhythm. S1 and S2 normal without gallop, murmur, click, rub or other extra sounds. Abdomen:  Bowel sounds positive,abdomen soft and non-tender without masses, organomegaly or hernias noted. Pulses:  R and L carotid,radial,,dorsalis pedis are full and equal bilaterally Extremities:  No clubbing, cyanosis, edema, or deformity noted with normal full range of motion of all joints.   Neurologic:  alert & oriented X3 and cranial nerves II-XII intact.   Skin:  Intact without suspicious lesions or rashes   Impression & Recommendations:  Problem # 1:  SUBACROMIAL BURSITIS, LEFT (ICD-726.19) Assessment Unchanged Pt has occasional recurrence rotator cuff syndrome (subacromial bursitis) and biceps tendonitis.  Will continue to take ibuprofen 600mg  daily as needed pain.  Offered injection today, but pt declined.  She will think about this and make appt if she would like injection.  Encouraged pt to resume shoulder exercises that PT showed her.  I will fill out FMLA paperwork for pt to have in case her shoulder pain flare up makes working in housekeeping untolerable.    Orders: [facility]- Est  Level 4 (69629)  Problem # 2:  HYPERTENSION, BENIGN (ICD-401.1) Assessment: Unchanged BP 126/77 and at goal of <130/80.  Will continue HCTZ 25mg  daily.  Pt is also taking KCl daily.  Will check electrolytes and renal fxn to monitor.  Pt will come back next Wed 09/06/10 for labs.    Her updated medication list for this problem includes:    Hydrochlorothiazide 25 Mg Tabs (Hydrochlorothiazide) .Marland Kitchen... Take 1 tablet by mouth once a day. please schedule appointment with doctor for more  refills.  Orders: [facility]- Est  Level 4 (99214)  Problem # 3:  HYPERCHOLESTEROLEMIA (ICD-272.0) Assessment: Unchanged Pt is due for FLP.  Unfortunately she is not fasting so will need to return next Wed, 09/06/10 for labs.  Last LDL 167, HDL 59, Trig 100.  Goal is LDL <130.  Her updated medication list for this problem includes:    Simvastatin 40 Mg Tabs (Simvastatin) .Marland Kitchen... 1 tab by mouth daily  Orders: [facility]- Est  Level 4 (52841)  Problem # 4:  OVERWEIGHT (ICD-278.02) Assessment: Improved Pt lost 10 lbs since last visit.  BMI improved from 28 to 26.  Encouraged pt to continue to exercise and healthier diet.    Complete Medication List: 1)  Hydrochlorothiazide 25 Mg Tabs (Hydrochlorothiazide) .... Take 1 tablet by mouth once a day. please schedule appointment with doctor for more refills. 2)  Klor-con 10 10 Meq Tbcr (Potassium chloride) .... 2 tabs by mouth daily 3)  Loratadine 10 Mg Tabs (Loratadine) .Marland Kitchen.. 1 by mouth daily 4)  Voltaren 1 % Gel (Diclofenac sodium) .Marland Kitchen.. 1 gram to affected area qid 5)  Simvastatin 40  Mg Tabs (Simvastatin) .Marland Kitchen.. 1 tab by mouth daily 6)  Aspir-low 81 Mg Tbec (Aspirin) .Marland Kitchen.. 1 tab by mouth daily  Patient Instructions: 1)  Please make appt for cholesterol check.  This has to be fasting, so no food or drink after midnight the night before appointment. 2)  Please schedule a follow-up appointment in 3 months for blood pressure and cholesterol.  3)  Take a coated Aspirin 81mg  every day.  4)  Make appt if you want injection.  5)  Congrats on your weight loss. Keep up the good work! Prescriptions: HYDROCHLOROTHIAZIDE 25 MG TABS (HYDROCHLOROTHIAZIDE) Take 1 tablet by mouth once a day. Please schedule appointment with doctor for more refills.  #90 x 3   Entered and Authorized by:   Angeline Slim MD   Signed by:   Angeline Slim MD on 09/01/2010   Method used:   Electronically to        [facility]* (retail)       [address]       [address]  Shipping/mailing       [place], [place]  [address]       Ph: [phone]       Fax: [phone]   RxID:   [id number] KLOR-CON 10 10 MEQ  TBCR (POTASSIUM CHLORIDE) 2 tabs by mouth daily  #180 x 3   Entered and Authorized by:   Angeline Slim MD   Signed by:   Angeline Slim MD on 09/01/2010   Method used:   Electronically to        [facility]* (retail)       [address]       [address] Shipping/mailing       [place], [place]  [address]       Ph: [phone]       Fax: [phone]   RxID:   [id number] LORATADINE 10 MG TABS (LORATADINE) 1 by mouth daily  #31 x 3   Entered and Authorized by:   Angeline Slim MD   Signed by:   Angeline Slim MD on 09/01/2010   Method used:   Electronically to        [facility]* (retail)       [address]       [address] Shipping/mailing       [place], [place]  [address]       Ph: [phone]       Fax: [phone]   RxID:   [id number] SIMVASTATIN 40 MG TABS (SIMVASTATIN) 1 tab by mouth daily  #90 x 3   Entered and Authorized by:   Angeline Slim MD   Signed by:   Angeline Slim MD on 09/01/2010   Method used:   Electronically to        [facility]* (retail)       [address]       [address] Shipping/mailing       [place], [place]  [address]       Ph: [phone]       Fax: [phone]   RxID:   [id number] VOLTAREN 1 % GEL (DICLOFENAC SODIUM) 1 gram to affected area QID  #100 gm x 0   Entered and Authorized by:   Angeline Slim MD   Signed by:   Angeline Slim MD on 09/01/2010   Method used:   Electronically to  [facility]* (retail)       [address]       [address] Shipping/mailing       [place], [place]  [address]       Ph: [phone]       Fax: [phone]   RxID:   [id number]    Orders Added: 1)  [facility]- Est  Level 4 [[address]]     Prevention & Chronic Care Immunizations   Influenza vaccine: given  (07/27/2008)   Influenza vaccine deferral: Not indicated   (07/13/2009)   Influenza vaccine due: 05/27/2010    Tetanus booster: 06/28/2007: given   Tetanus booster due: 06/2017    Pneumococcal vaccine: Not documented  Colorectal Screening   Hemoccult: Negative  (06/16/2009)   Hemoccult action/deferral: Ordered  (06/06/2009)    Colonoscopy:  Results:  Very redundant colon. Otherwiese Normal to terminal ileum. Location:  [facility].    (09/09/2009)   Colonoscopy action/deferral: Repeat colonoscopy in 5 years.   (09/09/2009)   Colonoscopy due: 08/2014  Other Screening   Pap smear: NEGATIVE FOR INTRAEPITHELIAL LESIONS OR MALIGNANCY.  (02/03/2010)   Pap smear due: 11/24/2009    Mammogram: ASSESSMENT: Negative - BI-RADS 1^MM DIGITAL SCREENING  (10/17/2009)   Mammogram due: 10/2009   Smoking status: never  (09/01/2010)  Lipids   Total Cholesterol: 246  (06/08/2009)   LDL: 167  (06/08/2009)   LDL Direct: 136  (08/09/2008)   HDL: 59  (06/08/2009)   Triglycerides: 100  (06/08/2009)    SGOT (AST): Not documented   SGPT (ALT): Not documented   Alkaline phosphatase: Not documented   Total bilirubin: Not documented    Lipid flowsheet reviewed?: Yes   Progress toward LDL goal: Unchanged  Hypertension   Last Blood Pressure: 126 / 77  (09/01/2010)   Serum creatinine: 0.78  (06/08/2009)   BMP action: Ordered   Serum potassium 4.1  (06/08/2009)    Hypertension flowsheet reviewed?: Yes   Progress toward BP goal: At goal  Self-Management Support :   Personal Goals (by the next clinic visit) :      Personal blood pressure goal: 130/80  (06/06/2009)     Personal LDL goal: 130  (09/01/2010)    Hypertension self-management support: Not documented    Lipid self-management support: Not documented

## 2010-09-28 NOTE — Progress Notes (Signed)
Summary: Phn Msg  Phone Note Call from Patient Call back at Home Phone [phone]   Reason for Call: Lab or Test Results Summary of Call: requesting to speak with RN re: results, just spoke with Dr. Janalyn Harder but has more questions.  Initial call taken by: Knox Royalty,  September 20, 2010 10:54 AM  Follow-up for Phone Call        she wanted to know what foods she could eat to bring her hgb up. I gave her several examples. advised if she could get to a computer she could get a more complete list of high iron foods. Follow-up by: Golden Circle RN,  September 20, 2010 11:41 AM

## 2010-09-28 NOTE — Miscellaneous (Signed)
Summary: Changing prob   Clinical Lists Changes  Problems: Removed problem of HYPOKALEMIA (ICD-276.8) Removed problem of DYSFUNCTIONAL UTERINE BLEEDING (ICD-626.8) Removed problem of OVARIAN CYST (ICD-620.2) Removed problem of CONSTIPATION, CHRONIC, HX OF (ICD-V12.79)

## 2010-10-23 ENCOUNTER — Ambulatory Visit
Admission: RE | Admit: 2010-10-23 | Discharge: 2010-10-23 | Disposition: A | Discharge status: Home / Self Care | Payer: 59 | Source: Ambulatory Visit | Attending: *Deleted | Admitting: *Deleted

## 2010-10-23 DIAGNOSIS — Z1239 Encounter for other screening for malignant neoplasm of breast: Secondary | ICD-10-CM

## 2010-12-05 LAB — BASIC METABOLIC PANEL
BUN: 15 mg/dL (ref 6–23)
Chloride: 99 mEq/L (ref 96–112)
GFR calc Af Amer: >60 mL/min (ref >60)
Potassium: 3.5 mEq/L (ref 3.5–5.1)

## 2010-12-05 LAB — CBC
HCT: 38.8 % (ref 36.0–46.0)
MCHC: 34.5 g/dL (ref 30.0–36.0)
MCV: 91.3 fL (ref 78.0–100.0)
Platelets: 229 10*3/uL (ref 150–400)
WBC: 4.6 10*3/uL (ref 4.0–10.5)

## 2010-12-05 LAB — CULTURE, ROUTINE-ABSCESS

## 2011-01-09 NOTE — Op Note (Signed)
NAMECHANON, LONEY               ACCOUNT NO.:  [id number]   MEDICAL RECORD NO.:  [id number]          PATIENT TYPE:  AMB   LOCATION:  [facility]                           FACILITY:  [facility]   PHYSICIAN:  Lesly Dukes, M.D. DATE OF BIRTH:  [birth year]   DATE OF PROCEDURE:  01/11/2009  DATE OF DISCHARGE:  01/11/2009                               OPERATIVE REPORT   PREOPERATIVE DIAGNOSIS:  A 55 year old female with abnormal uterine  bleeding.   POSTOPERATIVE DIAGNOSES:  A 55 year old female with abnormal uterine  bleeding and an endocervical polyp.   PROCEDURE:  Dilation and curettage, hysteroscopy, hydrothermal ablation,  and cervical polypectomy.   SURGEON:  Lesly Dukes, MD   ANESTHESIA:  General.   FINDINGS:  1.5-cm polyp extruding from the cervix, anteverted uterus.  No anatomical abnormalities noted.   SPECIMEN:  Polyp of cervix and endometrial curettings to Pathology.   ESTIMATED BLOOD LOSS:  Minimal.   COMPLICATIONS:  None.   PROCEDURE:  After informed consent was obtained, the patient was taken  to the operating room where general anesthesia was induced.  The patient  was placed in dorsal lithotomy position and prepped and draped in normal  sterile fashion.  SCDs were all in the lower leg and the bladder was  emptied with the red rubber catheter.  Exam under anesthesia was  performed and the uterus was confirmed to be anteverted.  A bivalve  speculum was placed into the patient's vagina.  The cervix brought into  view.  An endocervical polyp was extruding from the cervial os.  This  was easily removed with good hemostasis.  The anterior lip of the cervix  was then grasped with a single-toothed tenaculum and gently dilated with  half-size Hegar dilators to #8.  A sharp curette was then gently  introduced into the uterus and gentle sharp curettage was performed.  These endometrial curettings were sent to Pathology.  Hysteroscopy was  then performed and hydrothermal  ablation was performed with the company  specifications.  He had an appropriate warming phase, a 10-minute  ablation and a 1-minute cool down.  The patient tolerated procedure  well.  All instruments were removed from the patient's vagina.  There  was good hemostasis on the cervix.  All counts were correct x2 and the  patient went to recovery room in stable condition.      Lesly Dukes, M.D.  Electronically Signed    KHL/MEDQ  D:  01/11/2009  T:  01/12/2009  Job:  [id number]

## 2011-01-09 NOTE — Group Therapy Note (Signed)
NAMEDORLEEN, KISSEL NO.:  [id number]   MEDICAL RECORD NO.:  [id number]          PATIENT TYPE:  WOC   LOCATION:  [facility]                   FACILITY:  [facility]   PHYSICIAN:  Argentina Donovan, MD        DATE OF BIRTH:  1956-03-27   DATE OF SERVICE:  01/26/2009                                  CLINIC NOTE   The patient is a 55 year old African American female with a 12-week size  uterus and multiple leiomyomata within the uterine corpus who underwent  D and C hysteroscopy and hydro thermal endometrial ablation on May 15.  Two  days after surgery she went into the MAU because of nausea,  vomiting and pain that seemed to be due secondary to the oxycodone she  was on.  She comes in today for a 2-week checkup.  She is still having  significant amount of right lower quadrant pain.   ABDOMEN:  Soft, flat, slightly tender in the right lower quadrant but  without guarding or rebound.  GENITAL EXAMINATION:  External genitalia is normal.  BUS within normal  limits.  Vagina is clean and well rugated with no significant abnormal  discharge.  The cervix is parous, clean with very little discharge.  Wet  prep and GC chlamydia were however taken.  BIMANUAL PELVIC EXAMINATION:  The uterus was somewhat tender but no  chandelier sign and it did seem more tender in the left adnexa.  There  certainly does not appear to be an endometritis or PID, but I am  wondering whether she may be having a slight degenerating fibroid as a  secondary process to the ablation as the cramps have gotten much worse  over the past week.  The hydrocodone she cannot tolerate but she can  tolerate ibuprofen so we are starting her on 800 mg of that.  I am going  to have her come back in a week and get a CBC today and a sed rate,  although I think that pelvic infection is unlikely.   IMPRESSION:  Postoperative cramping in the left adnexa.           ______________________________  Argentina Donovan, MD     PR/MEDQ  D:  01/26/2009  T:  01/26/2009  Job:  [id number]

## 2011-01-09 NOTE — Group Therapy Note (Signed)
NAMECASSIDEE, DEATS NO.:  [id number]   MEDICAL RECORD NO.:  [id number]          PATIENT TYPE:  WOC   LOCATION:  [facility]                   FACILITY:  [facility]   PHYSICIAN:  Maylon Cos, CNM    DATE OF BIRTH:  [birth year]   DATE OF SERVICE:  11/24/2008                                  CLINIC NOTE   Patient presents as a referral from Dr. Denny Levy of [facility] secondary to a 79-month history of dysfunctional uterine  bleeding.  The patient was first being followed by Dr. Norton Blizzard in  conjunction with Dr. Denny Levy at the [facility]  because of complaints of having spotting every other week in between  periods.  She described the bleeding as light and foul-smelling, lasing  for one to two days, then going away for three or four days, then coming  back, requiring her wear a panty liner daily.  She states that blood  work was done at the [facility], but suggested that she was  going through menopause and that she was treated with oral contraceptive  pills which did not help her bleeding.  After several visits for the  same reason, Dr. Pearletha Forge referred her to Dr. Jennette Kettle, who performed an  endometrial biopsy and ordered a pelvic ultrasound.  The results of her  endometrial biopsy were inconclusive secondary to the findings of non-  uterine cells, and her pelvic ultrasound does indicate that she has  multiple uterine fibroids with the largest being 5.8 x 3.4 x 4.4 cm.  In  all, she has seven fibroids, measuring from 5.8 cm down to 2.4 cm.  The  patient denies pelvic pain, pain with intercourse, but states that she  has bleeding after intercourse and continuous spotting.   PAST MEDICAL HISTORY:   CURRENT MEDICATIONS INCLUDE:  Hydrochlorothiazide, Klor-Con, ferrous  sulfate and ibuprofen p.r.n.  Please see the med list in her chart for  dosage and timing of the administrations.   IMMUNIZATIONS:  Her immunizations  for chicken pox and flu are up-to-  date.   CONTRACEPTIVE HISTORY:  She currently has none.   OBSTETRICAL HISTORY:  She has been pregnant six times.  She has three  living children.  She has history of one miscarriage.  Her pregnancies  were uncomplicated vaginal deliveries.  Her last Pap smear was performed  October 28, 2007 and was normal.  Her last mammogram was October 14, 2008  and was also normal.   SURGICAL HISTORY:  She denies blood transfusions.  She had her appendix  removed and a tubal ligation.   FAMILY HISTORY:  Positive for diabetes in her mother and grandmother.  High blood pressure in her mother and cancer of unknown origin in her  brother.   PAST MEDICAL HISTORY:  Positive for arthritis, asthma, high cholesterol  and high blood pressure, which is being managed by the [facility].   SOCIAL HISTORY:  She lives with her daughter and fiance and works  outside the home.  She denies smoking or any use of alcohol.  REVIEW OF SYSTEMS:  Her systemic review is positive for muscle aches,  night sweats, dizzy spells, nausea, vomiting, hot flashes, loss of urine  with coughing and sneezing and vaginal bleeding with vaginal odor.   EXAMINATION TODAY:  Ms. Rickerson is a pleasant 55 year old female who  appears to be her stated age.  VITAL SIGNS:  Within normal limits.  Blood pressure is 117/76.  Her  weight today is 165.5.  She states that she recently lost 30 pounds  through diet and exercise and continues to hope to lose weight for  general health.  Her exam today is problem-focused.  Her abdomen is soft and nontender,  slightly obese, nonenlarged,  PELVIC EXAM:  Genitalia:  She is a Tanner 5 with irregular rugae, a  scant amount of vaginal discharge.  A wet prep was obtained and sent to  the lab.  She has no external or internal vaginal lesions.  Her cervix  is smooth and parous without lesions.  However, a small, approximately  0.5-cm oblong  cervical polyp is noted in the os.  A Pap smear with broom  and brush was collected that did elicit bleeding from the cervical  polyp.  Endometrial biopsy was also performed.  Procedure was done in  normal fashion under no anesthesia.  Cervix was cleansed with Betadine  swabs and a tenaculum was placed at approximately 4 o'clock.  Uterus  sounded to 8.5 cm.  Endometrial biopsy was performed in three passes to  obtain a sufficient amount of uterine tissue.  Tenaculum was removed and  bleeding was resolved with pressures to the cervical os.  Patient  tolerated the procedure well.  On bimanual exam, the patient's uterus is  enlarged to approximately 10-[redacted] weeks gestation size.  It is irregular  in shape.  However, it is nontender.  Adnexa are nontender.  No cervical  motion tenderness.   IMPRESSION TODAY:  1. The patient is a 55 year old with dysfunctional uterine bleeding.  2. Uterine fibroids.   PLAN FOR TODAY:  1. Have the patient follow up in two to three weeks to review the      results of endometrial biopsy and Pap smear.  2. CBC today to assess for anemia.  3. Options for management of dysfunctional uterine bleeding were      reviewed with the patient.  She was given the option for Depo-      Provera injections q. 3 months and ablation was also discussed with      the patient.  Written literature was given on all dysfunctional      uterine bleeding treatment options.  The patient is not interested      in hysterectomy.  The patient will follow up to review the results      of endometrial biopsy and Pap in two to three weeks and further      discuss/plan options for treatment.           ______________________________  Maylon Cos, CNM     SS/MEDQ  D:  11/24/2008  T:  11/24/2008  Job:  [id number]

## 2011-01-09 NOTE — Group Therapy Note (Signed)
Kathryn Hendricks, Kathryn Hendricks NO.:  [id number]   MEDICAL RECORD NO.:  [id number]          PATIENT TYPE:  WOC   LOCATION:  [facility]                   FACILITY:  [facility]   PHYSICIAN:  Elsie Lincoln, MD      DATE OF BIRTH:  05/10/1956   DATE OF SERVICE:  12/22/2008                                  CLINIC NOTE   The patient is a 55 year old female who presents for follow-up results.  The patient has had abnormal uterine bleeding for several months.  The  patient thought she was in menopause because she is experiencing  menopausal symptoms.  However, she has had irregular periods up until  the past 8 months and they seem to becoming more irregular.  The patient  has had 2 attempts at endometrial biopsy and both showed insufficient  cells.  The patient is not anemic.  We discussed hysterectomy versus  finding out what is actually causing the bleeding, whether it is  precancerous or cancerous cells.  The patient does not want a  hysterectomy.  At this point I think that we should find the diagnosis  of why she is bleeding, so the patient was consented for D and  C/hysteroscopy and a hydrothermal ablation.  The patient understands the  risks of the procedure include but are not limited to bleeding,  infection, damage to the back of the uterus and burning of the vagina.  Also did draw [facility] today, given that she is very symptomatic with her hot  flashes.  The patient has had a full H and P by Maylon Cos less than  a month ago, so I am not going to repeat this exam or history.  Will  schedule the patient for surgery.           ______________________________  Elsie Lincoln, MD     KL/MEDQ  D:  12/22/2008  T:  12/22/2008  Job:  [id number]

## 2011-01-12 ENCOUNTER — Inpatient Hospital Stay (INDEPENDENT_AMBULATORY_CARE_PROVIDER_SITE_OTHER)
Admission: RE | Admit: 2011-01-12 | Discharge: 2011-01-12 | Disposition: A | Discharge status: Home / Self Care | Payer: Commercial Managed Care - PPO | Source: Ambulatory Visit | Attending: Emergency Medicine | Admitting: Emergency Medicine

## 2011-01-12 DIAGNOSIS — B029 Zoster without complications: Secondary | ICD-10-CM

## 2011-01-15 LAB — HERPES SIMPLEX VIRUS CULTURE

## 2011-03-30 ENCOUNTER — Telehealth: Payer: Self-pay | Admitting: *Deleted

## 2011-03-30 ENCOUNTER — Inpatient Hospital Stay (INDEPENDENT_AMBULATORY_CARE_PROVIDER_SITE_OTHER)
Admission: RE | Admit: 2011-03-30 | Discharge: 2011-03-30 | Disposition: A | Discharge status: Home / Self Care | Payer: 59 | Source: Ambulatory Visit | Attending: Family Medicine | Admitting: Family Medicine

## 2011-03-30 DIAGNOSIS — R209 Unspecified disturbances of skin sensation: Secondary | ICD-10-CM

## 2011-03-30 DIAGNOSIS — M25519 Pain in unspecified shoulder: Secondary | ICD-10-CM

## 2011-03-30 NOTE — Telephone Encounter (Signed)
Patient calls reporting she has numbness and tingling of right arm and hand. This started 2 days ago and occurs off and on.  Denies any other symptoms. Offered appointment here at 11:30 but she cannot get here by then. No other appointment available here today.  Advised she should go to urgent care to be evaluated . She voices understanding

## 2011-06-01 LAB — POCT I-STAT, CHEM 8
Glucose, Bld: 82 mg/dL (ref 70–99)
HCT: 40 % (ref 36.0–46.0)
Hemoglobin: 13.6 g/dL (ref 12.0–15.0)
Potassium: 3.9 mEq/L (ref 3.5–5.1)
Sodium: 142 mEq/L (ref 135–145)
TCO2: 29 mmol/L (ref 0–100)

## 2011-06-01 LAB — POCT PREGNANCY, URINE: Preg Test, Ur: NEGATIVE

## 2011-10-03 ENCOUNTER — Other Ambulatory Visit: Payer: Self-pay | Admitting: Emergency Medicine

## 2011-10-03 DIAGNOSIS — Z1231 Encounter for screening mammogram for malignant neoplasm of breast: Secondary | ICD-10-CM

## 2011-10-16 ENCOUNTER — Other Ambulatory Visit: Payer: Self-pay | Admitting: Family Medicine

## 2011-10-16 DIAGNOSIS — I10 Essential (primary) hypertension: Secondary | ICD-10-CM

## 2011-10-16 NOTE — Telephone Encounter (Signed)
Patient has appointment with Dr. Elwyn Reach 10/23/2011.  Ileana Ladd

## 2011-10-23 ENCOUNTER — Encounter: Payer: Self-pay | Admitting: Emergency Medicine

## 2011-10-23 ENCOUNTER — Ambulatory Visit (INDEPENDENT_AMBULATORY_CARE_PROVIDER_SITE_OTHER): Payer: 59 | Admitting: Emergency Medicine

## 2011-10-23 VITALS — BP 115/73 | HR 80 | Temp 98.4°F | Ht 65.0 in | Wt 156.0 lb

## 2011-10-23 DIAGNOSIS — I10 Essential (primary) hypertension: Secondary | ICD-10-CM

## 2011-10-23 DIAGNOSIS — K625 Hemorrhage of anus and rectum: Secondary | ICD-10-CM

## 2011-10-23 DIAGNOSIS — M545 Low back pain, unspecified: Secondary | ICD-10-CM | POA: Insufficient documentation

## 2011-10-23 DIAGNOSIS — IMO0002 Reserved for concepts with insufficient information to code with codable children: Secondary | ICD-10-CM

## 2011-10-23 DIAGNOSIS — M75100 Unspecified rotator cuff tear or rupture of unspecified shoulder, not specified as traumatic: Secondary | ICD-10-CM

## 2011-10-23 DIAGNOSIS — E7800 Pure hypercholesterolemia, unspecified: Secondary | ICD-10-CM

## 2011-10-23 MED ORDER — HYDROCHLOROTHIAZIDE 25 MG PO TABS: 25.0000 mg | ORAL_TABLET | Freq: Every day | ORAL | Status: DC

## 2011-10-23 MED ORDER — CYCLOBENZAPRINE HCL 5 MG PO TABS: 5.0000 mg | ORAL_TABLET | Freq: Every evening | ORAL | Status: AC | PRN

## 2011-10-23 NOTE — Assessment & Plan Note (Signed)
Blood pressure well controlled on current medications.  Will continue HCTZ 25mg  daily.  Will check electrolytes.  Follow up in 6 months.

## 2011-10-23 NOTE — Progress Notes (Addendum)
  Subjective:    Patient ID: Kathryn Hendricks, female    DOB: June 18, 1956, 56 y.o.   MRN: [id number]  HPI Ms. Wanat is here today for several issues (stomach cramping deferred to next visit).  1. Meet new MD: I have reviewed and updated the following as appropriate: allergies, current medications, past family history, past medical history, past social history, past surgical history and problem list.  2. Back Pain: Patient developed pain in the left lower back this morning.  Responded well to motrin and was able to complete her shift in [employer] at [facility]. States had a back injury last year at work and an x-ray showed some arthritis type changes.  The current pain is worse with weight on left leg and pressing on the left lower back.  Pain is located in the left lower back and will occasionally travel to the back of her left knee.  Range of motion of hip and back is preserved.  She does not identify any triggers for this flair.  This is her typical pain; has flairs every 3 months that last a few days.  3. Shoulder Pain: history of shoulder injury with intermittent flair ups.  Flair ups cause difficulty with overhead lifting.  Was diagnoses with bursitis by Dr. Darrick Penna several years ago.  Currently asymptomatic.  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  4. Rectal Bleeding: Occurs occasionally.  Has a small amount of bleeding (clears with 2 wipes) about 30 minutes after a bowel movement; never any blood in toilet. Will occasionally have some anal itching.  Has a history of constipation, but not currently constipation.  Normal colonoscopy 2 years ago.   Review of Systems See HPI, otherwise negative    Objective:   Physical Exam  Constitutional: She is oriented to person, place, and time. She appears well-developed and well-nourished. No distress.  HENT:  Head: Normocephalic and atraumatic.  Mouth/Throat: Oropharynx is clear and moist. No  oropharyngeal exudate.  Eyes: Pupils are equal, round, and reactive to light. No scleral icterus.  Neck: Neck supple.  Cardiovascular: Normal rate, regular rhythm, normal heart sounds and intact distal pulses.   No murmur heard. Pulmonary/Chest: Effort normal and breath sounds normal. She has no wheezes. She has no rales.  Genitourinary:       No fissures or hemorrhoids seen on exam; normal rectal tone; no blood on rectal exam  Musculoskeletal:       Left shoulder: Normal. She exhibits normal range of motion and no tenderness.       Left hip: She exhibits normal range of motion and no tenderness.       Lumbar back: Normal. She exhibits normal range of motion, no tenderness and no bony tenderness.       Legs: Lymphadenopathy:    She has no cervical adenopathy.  Neurological: She is alert and oriented to person, place, and time.  Skin: Skin is warm and dry. No rash noted.       Assessment & Plan:

## 2011-10-23 NOTE — Patient Instructions (Addendum)
It was nice to meet you!  For the back pain - take motrin 600mg  3 or 4 times a day for the next 2-3 days. -I also sent prescription Flexeril - take this at night as needed  Rectal bleeding - come back if you see blood in the toilet water - if you are constipated take a stool softener - drink lots of water - eats lots of fiber (found in fruits, vegetables, and whole grains)  Please make a lab appointment to get fasting blood work drawn in the next 1-2 weeks.  I will see you back in 6 months to follow up your blood pressure or sooner if needed.

## 2011-10-23 NOTE — Assessment & Plan Note (Signed)
Likely secondary to internal hemorrhoids.  Normal colonoscopy 2 years ago.  Encouraged fluid intake, fiber intake, and use of stool softeners if constipated.  She is to return to clinic for further evaluation if the bleeding gets worse (i.e. Noticing blood in toilet bowel).  Patient understands and agrees with plan.

## 2011-10-23 NOTE — Assessment & Plan Note (Signed)
LDL at goal.  Will check fasting lipid panel and continue current medication.

## 2011-10-23 NOTE — Assessment & Plan Note (Signed)
History and exam consistent with acute flair of SI-joint pain (likely secondary to arthritis).  Hip exam is normal.  Will do scheduled motrin 600mg  3-4x/day.  Will also give Flexeril 5mg  to take at bedtime as needed.

## 2011-10-23 NOTE — Assessment & Plan Note (Signed)
Has periodic flares; currently asymptomatic.  Filled out paperwork for work.

## 2011-10-26 ENCOUNTER — Other Ambulatory Visit: Payer: 59

## 2011-10-26 ENCOUNTER — Ambulatory Visit
Admission: RE | Admit: 2011-10-26 | Discharge: 2011-10-26 | Disposition: A | Discharge status: Home / Self Care | Payer: 59 | Source: Ambulatory Visit | Attending: Family Medicine | Admitting: Family Medicine

## 2011-10-26 DIAGNOSIS — I10 Essential (primary) hypertension: Secondary | ICD-10-CM

## 2011-10-26 DIAGNOSIS — Z1231 Encounter for screening mammogram for malignant neoplasm of breast: Secondary | ICD-10-CM

## 2011-10-26 LAB — LIPID PANEL
HDL: 58 mg/dL (ref >39)
LDL Cholesterol: 176 mg/dL — ABNORMAL HIGH (ref 0–99)
Total CHOL/HDL Ratio: 4.4 Ratio
VLDL: 20 mg/dL (ref 0–40)

## 2011-10-26 LAB — COMPREHENSIVE METABOLIC PANEL
ALT: 15 U/L (ref 0–35)
AST: 20 U/L (ref 0–37)
Alkaline Phosphatase: 95 U/L (ref 39–117)
BUN: 18 mg/dL (ref 6–23)
Calcium: 9.6 mg/dL (ref 8.4–10.5)
Chloride: 101 mEq/L (ref 96–112)
Creat: 0.72 mg/dL (ref 0.50–1.10)

## 2011-10-26 NOTE — Progress Notes (Signed)
cmp and flp done today Kathryn Hendricks 

## 2011-11-05 ENCOUNTER — Telehealth: Payer: Self-pay | Admitting: Emergency Medicine

## 2011-11-05 NOTE — Telephone Encounter (Signed)
Attempted to call patient to discuss results of lipid panel.  Patient was unavailable.  Left message requesting she call the clinic to provide a time that would be convenient  for her to discuss results.

## 2011-11-06 ENCOUNTER — Other Ambulatory Visit: Payer: Self-pay | Admitting: Emergency Medicine

## 2011-11-06 MED ORDER — SIMVASTATIN 40 MG PO TABS: 40.0000 mg | ORAL_TABLET | Freq: Every day | ORAL | Status: DC

## 2011-11-06 NOTE — Telephone Encounter (Signed)
Spoke with patient on the phone.  Patient has not been taking her simvastatin.  Will restart her medication and check lipid panel in 6 months.

## 2011-11-16 ENCOUNTER — Other Ambulatory Visit: Payer: Self-pay | Admitting: Family Medicine

## 2012-01-17 ENCOUNTER — Emergency Department (HOSPITAL_COMMUNITY)
Admission: EM | Admit: 2012-01-17 | Discharge: 2012-01-18 | Disposition: A | Discharge status: Home / Self Care | Payer: 59 | Attending: Emergency Medicine | Admitting: Emergency Medicine

## 2012-01-17 ENCOUNTER — Emergency Department (HOSPITAL_COMMUNITY): Payer: 59

## 2012-01-17 ENCOUNTER — Encounter (HOSPITAL_COMMUNITY): Payer: Self-pay | Admitting: Emergency Medicine

## 2012-01-17 DIAGNOSIS — B9789 Other viral agents as the cause of diseases classified elsewhere: Secondary | ICD-10-CM | POA: Insufficient documentation

## 2012-01-17 DIAGNOSIS — B349 Viral infection, unspecified: Secondary | ICD-10-CM

## 2012-01-17 DIAGNOSIS — E785 Hyperlipidemia, unspecified: Secondary | ICD-10-CM | POA: Insufficient documentation

## 2012-01-17 DIAGNOSIS — I10 Essential (primary) hypertension: Secondary | ICD-10-CM | POA: Insufficient documentation

## 2012-01-17 LAB — DIFFERENTIAL
Basophils Absolute: 0 10*3/uL (ref 0.0–0.1)
Lymphocytes Relative: 38 % (ref 12–46)
Lymphs Abs: 2.5 10*3/uL (ref 0.7–4.0)
Neutro Abs: 3.4 10*3/uL (ref 1.7–7.7)
Neutrophils Relative %: 52 % (ref 43–77)

## 2012-01-17 LAB — CBC
Platelets: 225 10*3/uL (ref 150–400)
RBC: 4.12 MIL/uL (ref 3.87–5.11)
RDW: 13.1 % (ref 11.5–15.5)
WBC: 6.5 10*3/uL (ref 4.0–10.5)

## 2012-01-17 NOTE — ED Notes (Signed)
Patient with history of fever and chills, cough and shortness of breath.  She states this has been going on for two weeks.

## 2012-01-18 LAB — COMPREHENSIVE METABOLIC PANEL
ALT: 23 U/L (ref 0–35)
AST: 23 U/L (ref 0–37)
Alkaline Phosphatase: 103 U/L (ref 39–117)
CO2: 31 mEq/L (ref 19–32)
Calcium: 9.9 mg/dL (ref 8.4–10.5)
GFR calc Af Amer: >90 mL/min (ref >90)
GFR calc non Af Amer: >90 mL/min (ref >90)
Glucose, Bld: 91 mg/dL (ref 70–99)
Potassium: 3.2 mEq/L — ABNORMAL LOW (ref 3.5–5.1)
Sodium: 141 mEq/L (ref 135–145)

## 2012-01-18 MED ORDER — DEXTROMETHORPHAN POLISTIREX 30 MG/5ML PO LQCR: 60.0000 mg | ORAL | Status: AC | PRN

## 2012-01-18 MED ORDER — BENZONATATE 100 MG PO CAPS: 100.0000 mg | ORAL_CAPSULE | Freq: Two times a day (BID) | ORAL | Status: AC | PRN

## 2012-01-18 NOTE — ED Provider Notes (Signed)
Medical screening examination/treatment/procedure(s) were performed by non-physician practitioner and as supervising physician I was immediately available for consultation/collaboration.  Olivia Mackie, MD 01/18/12 [phone]

## 2012-01-18 NOTE — ED Provider Notes (Signed)
History     CSN: [id number]  Arrival date & time 01/17/12  2155   None     Chief Complaint  Patient presents with  . Cough  . Shortness of Breath    (Consider location/radiation/quality/duration/timing/severity/associated sxs/prior treatment) HPI Comments: The past 2, weeks.  Patient has been treating herself at home for cough, nonproductive, sore throat, and myalgias for the last 3, days.  She's been running a fever up to 103.5, has had increased sore throat, with painful swallowing, and now has developed a painful nodule behind her right ear.  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 female presenting with cough and shortness of breath. The history is provided by the patient.  Cough The current episode started more than 1 week ago. The problem occurs constantly. The problem has been gradually worsening. The cough is non-productive. The maximum temperature recorded prior to her arrival was 103 to 104 F. The fever has been present for 3 to 4 days. Associated symptoms include rhinorrhea, sore throat and shortness of breath. Pertinent negatives include no chest pain, no chills, no ear congestion, no ear pain, no headaches and no wheezing. She has tried decongestants for the symptoms. The treatment provided no relief.  Shortness of Breath  Associated symptoms include a fever, rhinorrhea, sore throat, cough and shortness of breath. Pertinent negatives include no chest pain and no wheezing.    Past Medical History  Diagnosis Date  . Hypertension   . Hyperlipidemia     Past Surgical History  Procedure Date  . Appendectomy   . Tubal ligation     Family History  Problem Relation Age of Onset  . Hypertension Mother   . Diabetes Mother   . Hypertension Sister   . Hypertension Brother     History  Substance Use Topics  . Smoking status: Never Smoker   . Smokeless tobacco: Not on file  . Alcohol Use: No      OB History    Grav Para Term Preterm Abortions TAB SAB Ect Mult Living                  Review of Systems  Constitutional: Positive for fever. Negative for chills.  HENT: Positive for sore throat, rhinorrhea and trouble swallowing. Negative for hearing loss, ear pain, congestion and neck stiffness.   Respiratory: Positive for cough and shortness of breath. Negative for wheezing.   Cardiovascular: Negative for chest pain.  Gastrointestinal: Negative for nausea.  Neurological: Negative for headaches.    Allergies  Penicillins  Home Medications   Current Outpatient Rx  Name Route Sig Dispense Refill  . ASPIRIN 81 MG PO TBEC Oral Take 81 mg by mouth daily.     Marland Kitchen DICLOFENAC SODIUM 1 % TD GEL Topical Apply 1 application topically 4 (four) times daily as needed. 1 gram to affected area for pain    . HYDROCHLOROTHIAZIDE 25 MG PO TABS Oral Take 25 mg by mouth daily.    Marland Kitchen LORATADINE 10 MG PO TABS Oral Take 10 mg by mouth daily.      Marland Kitchen POTASSIUM CHLORIDE 10 MEQ PO TBCR Oral Take 20 mEq by mouth daily. 2 tabs by mouth daily    . SIMVASTATIN 40 MG PO TABS Oral Take 40 mg by mouth daily.      BP 131/71  Pulse 88  Temp(Src) 99.1 F (37.3 C) (Oral)  Resp 17  SpO2  100%  Physical Exam  Constitutional: She appears well-developed and well-nourished.  HENT:  Right Ear: Tympanic membrane, external ear and ear canal normal.  Left Ear: Tympanic membrane, external ear and ear canal normal.  Mouth/Throat: Uvula is midline.  Eyes: Pupils are equal, round, and reactive to light.  Neck: Normal range of motion.  Cardiovascular: Normal rate.   Pulmonary/Chest: Effort normal.  Abdominal: Soft.  Musculoskeletal: Normal range of motion.  Neurological: She is alert.  Skin: Skin is warm.    ED Course  Procedures (including critical care time)  Labs Reviewed  COMPREHENSIVE METABOLIC PANEL - Abnormal; Notable for the following:    Potassium 3.2 (*)    All other components within normal  limits  CBC  DIFFERENTIAL  RAPID STREP SCREEN   Dg Chest 2 View  01/17/2012  *RADIOLOGY REPORT*  Clinical Data: 2-week history of cough and shortness of breath, with recent worsening of symptoms.  CHEST - 2 VIEW  Comparison: Two-view chest x-ray 10/12/2006.  Findings: Cardiomediastinal silhouette unremarkable, unchanged. Lungs clear.  Bronchovascular markings normal.  Pulmonary vascularity normal.  No pneumothorax.  No pleural effusions.  Mild degenerative changes involving the thoracic spine.  No significant interval change.  IMPRESSION: No acute cardiopulmonary disease.  Stable examination.  Original Report Authenticated By: Arnell Sieving, M.D.     1. Viral syndrome       MDM  Is most likely a viral syndrome        Arman Filter, NP 01/18/12 0153  Arman Filter, NP 01/18/12 1610

## 2012-01-18 NOTE — Discharge Instructions (Signed)
Cough, Adult  A cough is a reflex that helps clear your throat and airways. It can help heal the body or may be a reaction to an irritated airway. A cough may only last 2 or 3 weeks (acute) or may last more than 8 weeks (chronic).  CAUSES Acute cough:  Viral or bacterial infections.  Chronic cough:  Infections.   Allergies.   Asthma.   Post-nasal drip.   Smoking.   Heartburn or acid reflux.   Some medicines.   Chronic lung problems (COPD).   Cancer.  SYMPTOMS   Cough.   Fever.   Chest pain.   Increased breathing rate.   High-pitched whistling sound when breathing (wheezing).   Colored mucus that you cough up (sputum).  TREATMENT   A bacterial cough may be treated with antibiotic medicine.   A viral cough must run its course and will not respond to antibiotics.   Your caregiver may recommend other treatments if you have a chronic cough.  HOME CARE INSTRUCTIONS   Only take over-the-counter or prescription medicines for pain, discomfort, or fever as directed by your caregiver. Use cough suppressants only as directed by your caregiver.   Use a cold steam vaporizer or humidifier in your bedroom or home to help loosen secretions.   Sleep in a semi-upright position if your cough is worse at night.   Rest as needed.   Stop smoking if you smoke.  SEEK IMMEDIATE MEDICAL CARE IF:   You have pus in your sputum.   Your cough starts to worsen.   You cannot control your cough with suppressants and are losing sleep.   You begin coughing up blood.   You have difficulty breathing.   You develop pain which is getting worse or is uncontrolled with medicine.   You have a fever.  MAKE SURE YOU:   Understand these instructions.   Will watch your condition.   Will get help right away if you are not doing well or get worse.  Document Released: 02/09/2011 Document Revised: 08/02/2011 Document Reviewed: 02/09/2011 [facility] Patient Information 2012 [place],  [place]. Tonight, your x-ray is normal, your white count is normal, indicating no sign of infection, urine rapid strep is negative.  Smead, you do not need antibiotics.  He can take over-the-counter ibuprofen or Tylenol on a regular basis for your symptoms

## 2012-05-21 ENCOUNTER — Encounter: Payer: Self-pay | Admitting: Obstetrics & Gynecology

## 2012-05-21 ENCOUNTER — Ambulatory Visit (INDEPENDENT_AMBULATORY_CARE_PROVIDER_SITE_OTHER): Payer: 59 | Admitting: Obstetrics & Gynecology

## 2012-05-21 VITALS — BP 123/79 | HR 57 | Temp 97.3°F | Resp 16 | Ht 64.0 in | Wt 157.6 lb

## 2012-05-21 DIAGNOSIS — Z01419 Encounter for gynecological examination (general) (routine) without abnormal findings: Secondary | ICD-10-CM

## 2012-05-21 DIAGNOSIS — Z0000 Encounter for general adult medical examination without abnormal findings: Secondary | ICD-10-CM

## 2012-05-21 NOTE — Progress Notes (Signed)
Subjective:    Kathryn Hendricks is a 56 y.o. female who presents for an annual exam. She complains of a 2 month h/o RLQ pain/pressure. The patient is not currently sexually active. GYN screening history: last pap: was normal. The patient wears seatbelts: yes. The patient participates in regular exercise: yes. Has the patient ever been transfused or tattooed?: no. The patient reports that there is not domestic violence in her life.   Menstrual History: OB History    Grav Para Term Preterm Abortions TAB SAB Ect Mult Living   5 3 2 1 2 2    3       Menarche age: 49 No LMP recorded. Patient is postmenopausal.    The following portions of the patient's history were reviewed and updated as appropriate: allergies, current medications, past family history, past medical history, past social history, past surgical history and problem list.  Review of Systems A comprehensive review of systems was negative. She works at [employer] in environmental services. She lives with her 43 yo daughter.   Objective:    BP 123/79  Pulse 57  Temp 97.3 F (36.3 C) (Oral)  Resp 16  Ht 5\' 4"  (1.626 m)  Wt 157 lb 9.6 oz (71.487 kg)  BMI 27.05 kg/m2  General Appearance:    Alert, cooperative, no distress, appears stated age  Head:    Normocephalic, without obvious abnormality, atraumatic  Eyes:    PERRL, conjunctiva/corneas clear, EOM's intact, fundi    benign, both eyes  Ears:    Normal TM's and external ear canals, both ears  Nose:   Nares normal, septum midline, mucosa normal, no drainage    or sinus tenderness  Throat:   Lips, mucosa, and tongue normal; teeth and gums normal  Neck:   Supple, symmetrical, trachea midline, no adenopathy;    thyroid:  no enlargement/tenderness/nodules; no carotid   bruit or JVD  Back:     Symmetric, no curvature, ROM normal, no CVA tenderness  Lungs:     Clear to auscultation bilaterally, respirations unlabored  Chest Wall:    No tenderness or deformity   Heart:     Regular rate and rhythm, S1 and S2 normal, no murmur, rub   or gallop  Breast Exam:    No tenderness, masses, or nipple abnormality  Abdomen:     Soft, non-tender, bowel sounds active all four quadrants,    no masses, no organomegaly  Genitalia:    Normal female without lesion, discharge or tenderness, mild atrophy, 6 week size uterus, tender right adnexa     Extremities:   Extremities normal, atraumatic, no cyanosis or edema  Pulses:   2+ and symmetric all extremities  Skin:   Skin color, texture, turgor normal, no rashes or lesions  Lymph nodes:   Cervical, supraclavicular, and axillary nodes normal  Neurologic:   CNII-XII intact, normal strength, sensation and reflexes    throughout  .    Assessment:    Healthy female exam.  RLQ pain for 2 months   Plan:     Pelvic ultrasound. Thin prep Pap smear.

## 2012-06-11 ENCOUNTER — Ambulatory Visit: Payer: 59 | Admitting: Obstetrics & Gynecology

## 2012-06-11 ENCOUNTER — Ambulatory Visit (HOSPITAL_COMMUNITY)
Admission: RE | Admit: 2012-06-11 | Discharge: 2012-06-11 | Disposition: A | Discharge status: Home / Self Care | Payer: 59 | Source: Ambulatory Visit | Attending: Obstetrics & Gynecology | Admitting: Obstetrics & Gynecology

## 2012-06-11 ENCOUNTER — Telehealth: Payer: Self-pay

## 2012-06-11 DIAGNOSIS — D251 Intramural leiomyoma of uterus: Secondary | ICD-10-CM | POA: Insufficient documentation

## 2012-06-11 DIAGNOSIS — N949 Unspecified condition associated with female genital organs and menstrual cycle: Secondary | ICD-10-CM | POA: Insufficient documentation

## 2012-06-11 DIAGNOSIS — Z0000 Encounter for general adult medical examination without abnormal findings: Secondary | ICD-10-CM

## 2012-06-11 NOTE — Telephone Encounter (Signed)
Pt called and stated that she had an appt today but couldn't be seen today because she did not have a co-pay and for a f/u US.  Please call so I will know what to do. Called pt and informed pt that her [place] results resulted showing fibroids in which pt already knew that she had.  Pt stated that "I don't need an appt then cause I don't have any problems".  I advised pt that it is up to her if she doesn't want to schedule but she starts having any new symptoms to please make an appt.  Pt stated understanding and had no further questions.

## 2012-06-20 ENCOUNTER — Other Ambulatory Visit: Payer: Self-pay | Admitting: Emergency Medicine

## 2012-08-24 ENCOUNTER — Emergency Department (HOSPITAL_COMMUNITY)
Admission: EM | Admit: 2012-08-24 | Discharge: 2012-08-24 | Disposition: A | Discharge status: Home / Self Care | Payer: 59 | Source: Home / Self Care

## 2012-08-24 ENCOUNTER — Encounter (HOSPITAL_COMMUNITY): Payer: Self-pay | Admitting: Emergency Medicine

## 2012-08-24 DIAGNOSIS — K5900 Constipation, unspecified: Secondary | ICD-10-CM

## 2012-08-24 MED ORDER — PEG 3350-KCL-NABCB-NACL-NASULF 236 G PO SOLR: ORAL | Status: DC

## 2012-08-24 NOTE — ED Provider Notes (Signed)
History     CSN: [id number]  Arrival date & time 08/24/12  1602   None     Chief Complaint  Patient presents with  . Constipation    constipation x 1 wk and a half. used otc laxatives and softners no relief.     (Consider location/radiation/quality/duration/timing/severity/associated sxs/prior treatment) Patient is a 56 y.o. female presenting with constipation. The history is provided by the patient. No language interpreter was used.  Constipation  The current episode started more than 1 week ago. The onset was gradual. The problem occurs continuously. The problem has been gradually worsening. The pain is moderate. Prior successful therapies include laxatives. Prior unsuccessful therapies include fiber, stool softeners and laxatives. She has been behaving normally. She has been eating and drinking normally.  Pt reports no relief with miralax, mag citrate or stool softner  Past Medical History  Diagnosis Date  . Hypertension   . Hyperlipidemia   . Abnormal Pap smear 2011    Past Surgical History  Procedure Date  . Appendectomy   . Tubal ligation   . Hysteroscopy w/ endometrial ablation 2010  . Dilation and curettage of uterus 2010  . Wisdom tooth extraction   . Colonoscopy     Family History  Problem Relation Age of Onset  . Hypertension Mother   . Diabetes Mother   . Stroke Mother   . Hypertension Sister   . Hypertension Brother   . Cancer Brother     throat  . Diabetes Father   . Heart disease Father     History  Substance Use Topics  . Smoking status: Never Smoker   . Smokeless tobacco: Not on file  . Alcohol Use: No    OB History    Grav Para Term Preterm Abortions TAB SAB Ect Mult Living   5 3 2 1 2 2    3       Review of Systems  Gastrointestinal: Positive for constipation.  All other systems reviewed and are negative.    Allergies  Penicillins  Home Medications   Current Outpatient Rx  Name  Route  Sig  Dispense  Refill  . DICLOFENAC  SODIUM 1 % TD GEL   Topical   Apply 1 application topically 4 (four) times daily as needed. 1 gram to affected area for pain         . HYDROCHLOROTHIAZIDE 25 MG PO TABS   Oral   Take 25 mg by mouth daily.         Marland Kitchen LORATADINE 10 MG PO TABS   Oral   Take 10 mg by mouth daily.           Marland Kitchen POTASSIUM CHLORIDE 10 MEQ PO TBCR   Oral   Take 20 mEq by mouth daily. 2 tabs by mouth daily         . SIMVASTATIN 40 MG PO TABS   Oral   Take 40 mg by mouth daily.         . ASPIRIN 81 MG PO TBEC   Oral   Take 81 mg by mouth daily.          Marland Kitchen HYDROCHLOROTHIAZIDE 25 MG PO TABS      TAKE 1 TABLET BY MOUTH ONCE DAILY   30 tablet   6     pt would a like a 90 day supply     There were no vitals taken for this visit.  Physical Exam  Vitals reviewed. Constitutional: She is oriented to  person, place, and time. She appears well-developed and well-nourished.  HENT:  Head: Normocephalic.  Right Ear: External ear normal.  Left Ear: External ear normal.  Nose: Nose normal.  Mouth/Throat: Oropharynx is clear and moist.  Eyes: Conjunctivae normal are normal. Pupils are equal, round, and reactive to light.  Neck: Normal range of motion. Neck supple.  Cardiovascular: Normal rate and normal heart sounds.   Abdominal: Soft. Bowel sounds are normal. There is no tenderness.  Musculoskeletal: Normal range of motion.  Neurological: She is alert and oriented to person, place, and time.  Skin: Skin is warm.  Psychiatric: She has a normal mood and affect.    ED Course  Procedures (including critical care time)  Labs Reviewed - No data to display No results found.   1. Constipation       MDM  Pt advised increase water, fiber and walk.  One  glass go lyle an hour until relief       Lonia Skinner [place], [place] 08/24/12 1805  Lonia Skinner [place], [place] 08/24/12 [phone]

## 2012-08-24 NOTE — ED Notes (Signed)
Waiting discharge papers 

## 2012-08-24 NOTE — ED Notes (Signed)
Pt c/o constipation for 1 week and half. Pt has tried multiple otc meds with no relief. Pt states that is having nausea.  Hx of chronic constipation.

## 2012-09-25 ENCOUNTER — Other Ambulatory Visit: Payer: Self-pay | Admitting: Emergency Medicine

## 2012-10-16 ENCOUNTER — Other Ambulatory Visit: Payer: Self-pay | Admitting: Family Medicine

## 2012-10-16 DIAGNOSIS — Z1231 Encounter for screening mammogram for malignant neoplasm of breast: Secondary | ICD-10-CM

## 2012-11-03 ENCOUNTER — Ambulatory Visit: Payer: 59

## 2012-11-28 ENCOUNTER — Other Ambulatory Visit: Payer: Self-pay | Admitting: Emergency Medicine

## 2012-12-01 ENCOUNTER — Ambulatory Visit
Admission: RE | Admit: 2012-12-01 | Discharge: 2012-12-01 | Disposition: A | Discharge status: Home / Self Care | Payer: 59 | Source: Ambulatory Visit | Attending: Family Medicine | Admitting: Family Medicine

## 2012-12-01 DIAGNOSIS — Z1231 Encounter for screening mammogram for malignant neoplasm of breast: Secondary | ICD-10-CM

## 2013-01-26 ENCOUNTER — Other Ambulatory Visit: Payer: Self-pay | Admitting: Emergency Medicine

## 2013-01-26 NOTE — Telephone Encounter (Signed)
Requested Prescriptions   Pending Prescriptions Disp Refills  . hydrochlorothiazide (HYDRODIURIL) 25 MG tablet [Pharmacy Med Name: HYDROCHLOROTHIAZIDE 25 MG T 25 MG TAB] 30 tablet 6    Sig: TAKE 1 TABLET BY MOUTH ONCE DAILY  . hydrochlorothiazide (HYDRODIURIL) 25 MG tablet      Sig: Take 1 tablet (25 mg total) by mouth daily.

## 2013-02-12 ENCOUNTER — Telehealth: Payer: Self-pay | Admitting: Emergency Medicine

## 2013-02-12 NOTE — Telephone Encounter (Signed)
Pt is out of meds and Dr. Elwyn Reach schedule isn't up yet for her to make an apt  and pt needs prescription sent to pharmacy.

## 2013-02-12 NOTE — Telephone Encounter (Signed)
Called pt and she is in the office now.  Kathryn Hendricks, [name], CMA

## 2013-02-17 ENCOUNTER — Ambulatory Visit (INDEPENDENT_AMBULATORY_CARE_PROVIDER_SITE_OTHER): Payer: 59 | Admitting: Family Medicine

## 2013-02-17 ENCOUNTER — Encounter: Payer: Self-pay | Admitting: Family Medicine

## 2013-02-17 ENCOUNTER — Telehealth: Payer: Self-pay | Admitting: *Deleted

## 2013-02-17 VITALS — BP 111/72 | HR 61 | Temp 98.1°F | Ht 63.0 in | Wt 160.8 lb

## 2013-02-17 DIAGNOSIS — I10 Essential (primary) hypertension: Secondary | ICD-10-CM

## 2013-02-17 DIAGNOSIS — M7522 Bicipital tendinitis, left shoulder: Secondary | ICD-10-CM

## 2013-02-17 DIAGNOSIS — R5383 Other fatigue: Secondary | ICD-10-CM

## 2013-02-17 DIAGNOSIS — M7520 Bicipital tendinitis, unspecified shoulder: Secondary | ICD-10-CM

## 2013-02-17 DIAGNOSIS — R5381 Other malaise: Secondary | ICD-10-CM | POA: Insufficient documentation

## 2013-02-17 LAB — CBC
HCT: 34.7 % — ABNORMAL LOW (ref 36.0–46.0)
MCH: 29.3 pg (ref 26.0–34.0)
MCV: 86.1 fL (ref 78.0–100.0)
Platelets: 237 10*3/uL (ref 150–400)
RBC: 4.03 MIL/uL (ref 3.87–5.11)
RDW: 14 % (ref 11.5–15.5)
WBC: 4.2 10*3/uL (ref 4.0–10.5)

## 2013-02-17 LAB — BASIC METABOLIC PANEL
BUN: 12 mg/dL (ref 6–23)
CO2: 32 mEq/L (ref 19–32)
Calcium: 9.5 mg/dL (ref 8.4–10.5)
Chloride: 104 mEq/L (ref 96–112)
Creat: 0.64 mg/dL (ref 0.50–1.10)

## 2013-02-17 MED ORDER — MELOXICAM 15 MG PO TABS: 15.0000 mg | ORAL_TABLET | Freq: Every day | ORAL | Status: DC

## 2013-02-17 NOTE — Telephone Encounter (Signed)
Patient here in office today for appt with Dr. Lula Olszewski.  Needs intermittent FMLA forms completed by PCP.  Forms placed in Dr. Jonah Blue box for completion.  Will contact patient when forms ready to be picked up.  Gaylene Brooks, RN

## 2013-02-17 NOTE — Progress Notes (Signed)
  Subjective:    Patient ID: Kathryn Hendricks, female    DOB: August 11, 1956, 57 y.o.   MRN: [id number]  HPI:  [place] comes in with a few complaints:   She has been having episodes of dizziness and fatigue at work.  She says she has a history of anemia with irregular bleeding, but has never had thyroid problems. She has not checked her blood pressure or pulse when this happens.  No chest pain or dyspnea with the symptoms.  The symptoms go away with rest. Denies palpitations.   HTN: taking HCTZ 25, no issues that she knows of.  Again, + dizziness but no chest pain or dyspnea, no LE edema.   Left Shoulder pain: had a rotator cuff strain a few years ago.  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  Not taking any medications for it, not doing home exercise program, but still has band from PT.   Past Medical History  Diagnosis Date  . Hypertension   . Hyperlipidemia   . Abnormal Pap smear 2011    History  Substance Use Topics  . Smoking status: Never Smoker   . Smokeless tobacco: Not on file  . Alcohol Use: No    Family History  Problem Relation Age of Onset  . Hypertension Mother   . Diabetes Mother   . Stroke Mother   . Hypertension Sister   . Hypertension Brother   . Cancer Brother     throat  . Diabetes Father   . Heart disease Father      ROS Pertinent items in HPI    Objective:  Physical Exam:  BP 111/72  Pulse 61  Temp(Src) 98.1 F (36.7 C) (Oral)  Ht 5\' 3"  (1.6 m)  Wt 160 lb 12.8 oz (72.938 kg)  BMI 28.49 kg/m2 General appearance: alert, cooperative and no distress Head: Normocephalic, without obvious abnormality, atraumatic Lungs: clear to auscultation bilaterally Heart: regular rate and rhythm, S1, S2 normal, no murmur, click, rub or gallop Pulses: 2+ and symmetric  Left Shoulder: Inspection reveals no abnormalities, atrophy or asymmetry. Palpation reveals tenderness over bicipital  groove. ROM is full in all planes except limited overhead to 160 degrees Rotator cuff strength normal throughout, pain with all strength testing.  + Neer and Hawkin's tests, empty can       Assessment & Plan:

## 2013-02-17 NOTE — Assessment & Plan Note (Signed)
Recurrent.  Will have her re-start home exercise program.  Advised ice therapy.  Given hx of HTN will Rx meloxicam for on week only.

## 2013-02-17 NOTE — Patient Instructions (Signed)
It was good to see you.  Please see the hand out with shoulder exercises.  Also, try taking the meloxicam (anti-inflammatory) medication once a day for a week.  You should also ice your shoulder after work.   For your dizziness, please have some one check your blood pressure if you feel dizzy at work.  I am checking some lab work to see if there is a cause of your dizziness and fatigue.  I will send you a letter with your lab results, or call you if anything is abnormal.    Please make an appointment to see Dr. Elwyn Reach in 2 weeks.

## 2013-02-17 NOTE — Assessment & Plan Note (Signed)
And dizziness.  May be related to over-treatment of HTN, but will check TSH, CBC, and BMET.  F/u with PCP in 2 weeks.

## 2013-02-17 NOTE — Assessment & Plan Note (Signed)
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  F/U with PCP in 2 weeks.

## 2013-02-25 ENCOUNTER — Telehealth: Payer: Self-pay | Admitting: Emergency Medicine

## 2013-02-25 NOTE — Telephone Encounter (Signed)
Called and left message for patient to confirm that the FMLA paperwork is for recurrent rotator cuff tendonitis.  Requested that she call the office back to confirm so that I can fill out the [facility] form.

## 2013-02-25 NOTE — Telephone Encounter (Signed)
Opened in error

## 2013-03-09 ENCOUNTER — Telehealth: Payer: Self-pay | Admitting: Emergency Medicine

## 2013-03-09 NOTE — Telephone Encounter (Signed)
Pt dropped by stating that the FMLA forms that Dr. Elwyn Reach have are the correct forms.

## 2013-03-09 NOTE — Telephone Encounter (Signed)
I have the paperwork.  I need to confirm that the FMLA is for intermittent rotator cuff tendonopathy.  I have left a message with the patient for her to call back confirming that this is what the [facility] paperwork is for.

## 2013-03-13 ENCOUNTER — Telehealth: Payer: Self-pay | Admitting: Emergency Medicine

## 2013-03-13 NOTE — Telephone Encounter (Signed)
Pt came in. She verified FMLA  Is for the recurrent shoulder pain

## 2013-03-13 NOTE — Telephone Encounter (Signed)
Pt is returning Dr. Elwyn Reach call about FMLA. intermittent rotator cuff tendonopathy is the reason for the FMLA papers. She also needs Dr. Elwyn Reach to get them ready ASAP since there is a deadline on the papers when they have to filed. She also is wanting the results from here test that were done on her last visit. JW

## 2013-03-16 NOTE — Telephone Encounter (Signed)
I have completed the FMLA paperwork and placed it up front for pick up.  Labs from recent visit show a normal thyroid function and very mild anemia.

## 2013-03-26 ENCOUNTER — Ambulatory Visit: Payer: 59 | Admitting: Family Medicine

## 2013-03-26 ENCOUNTER — Telehealth: Payer: Self-pay | Admitting: *Deleted

## 2013-03-26 NOTE — Telephone Encounter (Signed)
Pt called and informed that appointment would have to be rescheduled due to emergency - rescheduled for Tues at [address], RN-BSN

## 2013-03-31 ENCOUNTER — Ambulatory Visit: Payer: 59 | Admitting: Family Medicine

## 2013-05-07 ENCOUNTER — Ambulatory Visit (INDEPENDENT_AMBULATORY_CARE_PROVIDER_SITE_OTHER): Payer: 59 | Admitting: Emergency Medicine

## 2013-05-07 ENCOUNTER — Encounter: Payer: Self-pay | Admitting: Emergency Medicine

## 2013-05-07 VITALS — BP 110/70 | HR 72 | Ht 64.0 in | Wt 165.9 lb

## 2013-05-07 DIAGNOSIS — R5383 Other fatigue: Secondary | ICD-10-CM

## 2013-05-07 DIAGNOSIS — R110 Nausea: Secondary | ICD-10-CM

## 2013-05-07 DIAGNOSIS — R6882 Decreased libido: Secondary | ICD-10-CM | POA: Insufficient documentation

## 2013-05-07 DIAGNOSIS — K30 Functional dyspepsia: Secondary | ICD-10-CM | POA: Insufficient documentation

## 2013-05-07 DIAGNOSIS — R5381 Other malaise: Secondary | ICD-10-CM

## 2013-05-07 DIAGNOSIS — I10 Essential (primary) hypertension: Secondary | ICD-10-CM

## 2013-05-07 DIAGNOSIS — K3189 Other diseases of stomach and duodenum: Secondary | ICD-10-CM

## 2013-05-07 MED ORDER — OMEPRAZOLE 40 MG PO CPDR: 40.0000 mg | DELAYED_RELEASE_CAPSULE | Freq: Every day | ORAL | Status: DC

## 2013-05-07 NOTE — Patient Instructions (Addendum)
It was nice to see you!  Your blood pressure is excellent!  Work on cutting out carbohydrates and sweets.  I think this will help with your stomach and fatigue.  Take omeprazole 1 pill daily to see if that will help with your stomach.  Follow up in 3 months or sooner if needed.

## 2013-05-07 NOTE — Assessment & Plan Note (Signed)
Likely a function of menopause. Discussed HRT given that her last period was 2 years ago. As she does not find this bothersome, will not treat at this time.

## 2013-05-07 NOTE — Assessment & Plan Note (Signed)
Suspect due to high carb diet. Discussed low carb diet. Checked H pylori which is negative. Start omeprazole 40mg  daily to see if that helps. Follow up 3 months or sooner if needed.

## 2013-05-07 NOTE — Progress Notes (Signed)
  Subjective:    Patient ID: Kathryn Hendricks, female    DOB: 04-14-1956, 57 y.o.   MRN: [id number]  HPI Kathryn Hendricks is here for f/u HTN and several concerns.  Hypertension Compliant with medication: yes Side effects from medication: no Check BP at home: no  Chest pain: no Palpitations: no Vision changes: no Leg edema: yes - occasional ankle swelling Dizziness: no  Fatigue Reports feeling fatigued for the last few months.  Had lab work done earlier this year which was normal.  She does eat a lot of sweets and has noticed that she kind of "crashes" at the end of the day.  No weakness, fevers, night sweats.  Sensitive stomach Reports that over the last year or so she has noticed that her stomach is more sensitive.  She will eat something (typically something sugary) and will have to run to the bathroom with loose stools.  Denies epigastric pain or heartburn.  Decreased libido Reports decreased interest in sex over the last year.  Denies any vaginal dryness or irritation, no dysparenuria.  She does not find this bothersome.  I have reviewed and updated the following as appropriate: allergies and current medications SHx: never smoker  Review of Systems See HPI    Objective:   Physical Exam BP 110/70  Pulse 72  Ht 5\' 4"  (1.626 m)  Wt 165 lb 14.4 oz (75.252 kg)  BMI 28.46 kg/m2 Gen: alert, cooperative, NAD HEENT: AT/[place], sclera white, MMM Neck: supple CV: RRR, no murmurs Pulm: CTAB, no wheezes or rales Abd: +BS, soft, NTND Ext: trace edema in ankles     Assessment & Plan:

## 2013-05-07 NOTE — Assessment & Plan Note (Signed)
TSH, CBC, CMP normal in the last year. Suspect related to high carb diet. A1c is pre-diabetic at 6.0 which may be contributing to fatigue. Discussed low carb diet extensively with the patient.

## 2013-05-07 NOTE — Assessment & Plan Note (Signed)
Well controlled. Continue HCTZ 25mg  daily. Follow up in 3 months.

## 2013-05-19 ENCOUNTER — Telehealth: Payer: Self-pay | Admitting: Emergency Medicine

## 2013-05-19 NOTE — Telephone Encounter (Signed)
Will fwd to Md.  Cisco Kindt L, CMA  

## 2013-05-19 NOTE — Telephone Encounter (Signed)
Pt called and would like a call back to discuss the notes the Dr. Piedad Climes left and her recent lab results. 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 JW

## 2013-05-19 NOTE — Telephone Encounter (Signed)
Called and left voicemail for patient regarding lab results. Requested that she call the office for an appointment in the next 2 weeks to discuss the BNP value and see how she is doing.  May need to consider ordering an echo to evaluate further.  Instructed to call clinic with any questions.

## 2013-05-22 NOTE — Telephone Encounter (Signed)
Unable to reach patient to discuss lab results.  She does have an appointment with me on 10/9.

## 2013-06-04 ENCOUNTER — Ambulatory Visit (INDEPENDENT_AMBULATORY_CARE_PROVIDER_SITE_OTHER): Payer: 59 | Admitting: Emergency Medicine

## 2013-06-04 ENCOUNTER — Encounter: Payer: Self-pay | Admitting: Emergency Medicine

## 2013-06-04 VITALS — BP 137/80 | HR 75 | Temp 98.0°F | Wt 157.0 lb

## 2013-06-04 DIAGNOSIS — R5383 Other fatigue: Secondary | ICD-10-CM

## 2013-06-04 DIAGNOSIS — R5381 Other malaise: Secondary | ICD-10-CM

## 2013-06-04 NOTE — Patient Instructions (Signed)
It was nice to see you!  One of the blood tests we did last time, the BNP, was a little elevated.  This can mean that your heart either isn't pumping well or isn't relaxing well.  It can lead to swelling in the legs, shortness of breath with exertion and fatigue.  We are going to check an ultrasound of your heart to evaluate how it is functioning. We are also going to set up a sleep study to see if you have something called sleep apnea which can cause fatigue.  Follow up with me in 1 month to go over the results.

## 2013-06-04 NOTE — Progress Notes (Signed)
  Subjective:    Patient ID: Kathryn Hendricks, female    DOB: May 09, 1956, 57 y.o.   MRN: [id number]  HPI Kathryn Hendricks is here for f/u of labs.  I saw her last month for fatigue.  As she also reported some lower extremity swelling and she has long history of hypertension, we had checked a pro-BNP which was elevated.  We discussed what this could mean today.  She added today, that over the last few months she has been experiencing dyspnea on exertion and has has some intermittent orthopnea.  Also snores loudly at night and does not always feel rested in the morning.  This is complicated by her hot flashes from menopause waking her up at night.  Additionally, her sister was recently diagnosed with stage IV colon cancer.  This has been very stressful for the patient and her family.  I have reviewed and updated the following as appropriate: allergies and current medications SHx: never smoker  Review of Systems See HPI    Objective:   Physical Exam BP 137/80  Pulse 75  Temp(Src) 98 F (36.7 C) (Oral)  Wt 157 lb (71.215 kg)  BMI 26.94 kg/m2 Gen: alert, cooperative, NAD HEENT: AT/[place], sclera white, MMM Neck: supple CV: RRR, no murmurs Ext: trace edema in bilateral ankles      Assessment & Plan:

## 2013-06-04 NOTE — Assessment & Plan Note (Signed)
Prior work up revealed an elevated pro-BNP. Discussed possibility of heart failure with patient.  Also discussed OSA as etiology. Will check echo and sleep study. Follow up in 1 month to go over results.

## 2013-06-09 ENCOUNTER — Ambulatory Visit (HOSPITAL_BASED_OUTPATIENT_CLINIC_OR_DEPARTMENT_OTHER): Payer: 59 | Attending: Family Medicine | Admitting: Radiology

## 2013-06-09 VITALS — Ht 65.0 in | Wt 157.0 lb

## 2013-06-09 DIAGNOSIS — R0989 Other specified symptoms and signs involving the circulatory and respiratory systems: Secondary | ICD-10-CM | POA: Insufficient documentation

## 2013-06-09 DIAGNOSIS — R5383 Other fatigue: Secondary | ICD-10-CM

## 2013-06-09 DIAGNOSIS — R5381 Other malaise: Secondary | ICD-10-CM

## 2013-06-09 DIAGNOSIS — G479 Sleep disorder, unspecified: Secondary | ICD-10-CM | POA: Insufficient documentation

## 2013-06-09 DIAGNOSIS — R0609 Other forms of dyspnea: Secondary | ICD-10-CM | POA: Insufficient documentation

## 2013-06-13 DIAGNOSIS — R5381 Other malaise: Secondary | ICD-10-CM

## 2013-06-13 DIAGNOSIS — R5383 Other fatigue: Secondary | ICD-10-CM

## 2013-06-13 NOTE — Procedures (Signed)
[name], Kathryn Hendricks               ACCOUNT NO.:  [id number]  MEDICAL RECORD NO.:  [id number]          PATIENT TYPE:  OUT  LOCATION:  SLEEP CENTER                 FACILITY:  [facility]  PHYSICIAN:  Vanshika Jastrzebski D. Maple Hudson, MD, FCCP, FACPDATE OF BIRTH:  1955/12/09  DATE OF STUDY:  06/09/2013                           NOCTURNAL POLYSOMNOGRAM  REFERRING PHYSICIAN:  Pearlean Brownie, M.D.  REFERRING PHYSICIAN:  Pearlean Brownie, MD  INDICATION FOR STUDY:  Hypersomnia with sleep apnea.  EPWORTH SLEEPINESS SCORE:  24/24.  BMI 26.1, weight 157 pounds, height 65 inches, neck 12 inches.  MEDICATIONS:  Home medications are charted for review.  SLEEP ARCHITECTURE:  Total sleep time 331.5 minutes with sleep efficiency 85.3%.  Stage I was 8.6%, stage II 62.1%, stage III absent, REM 29.3% of total sleep time.  Sleep latency 3 minutes, REM latency 67.5 minutes, awake after sleep onset 53 minutes.  Arousal index 17.7. Bedtime medication:  None.  RESPIRATORY DATA:  Apnea-hypopnea index (AHI) 3.8 per hour.  A total of 21 events was scored including 9 obstructive apneas, 6 central apneas, 6 hypopneas.  Events were seen in all sleep positions.  REM AHI 1.9 per hour.  There were not enough events to qualify for split protocol CPAP titration.  OXYGEN DATA:  Snoring mild to moderate with oxygen desaturation to a nadir of 92% and mean oxygen saturation through the study of 96.7% on room air.  CARDIAC DATA:  Sinus rhythm with rare PAC.  MOVEMENT/PARASOMNIA:  No significant movement disturbance.  No bathroom trips.  IMPRESSION/RECOMMENDATION: 1. Sleep architecture was noteworthy for more than expected time spent     in REM.  No bedtime medication. 2. Occasional respiratory events with sleep disturbance, within normal     limits.  AHI 3.8 per hour (the normal range for adults is an AHI     between 0 and 5 events per hour).  Mild-to-moderate snoring with     oxygen desaturation to a nadir of 92% and mean  oxygen saturation     through the study of 96.7% on room air 3. The Epworth sleepiness score of 24/24 indicates significant daytime     sleepiness consistent with available history.  The percentage time     spent in REM is higher than usually seen.  Together these invite     the possibility of a primary disorder of hypersomnia such as     narcolepsy.  If the clinical situation is appropriate, consider     contacting the [facility] for a multiple sleep latency     test as a daytime objective measure of     daytime sleepiness.  This would be done when she can be off of any     potential sedating or antidepressant medications for at least 2     days.     Philip Kotlyar D. Maple Hudson, MD, [facility], FACP Diplomate, American Board of Sleep Medicine    CDY/MEDQ  D:  06/13/2013 15:21:50  T:  06/13/2013 22:01:01  Job:  [id number]

## 2013-06-16 ENCOUNTER — Ambulatory Visit (HOSPITAL_COMMUNITY)
Admission: RE | Admit: 2013-06-16 | Discharge: 2013-06-16 | Disposition: A | Discharge status: Home / Self Care | Payer: 59 | Source: Ambulatory Visit | Attending: Emergency Medicine | Admitting: Emergency Medicine

## 2013-06-16 DIAGNOSIS — K589 Irritable bowel syndrome without diarrhea: Secondary | ICD-10-CM | POA: Insufficient documentation

## 2013-06-16 DIAGNOSIS — K7689 Other specified diseases of liver: Secondary | ICD-10-CM | POA: Insufficient documentation

## 2013-06-16 DIAGNOSIS — R5383 Other fatigue: Secondary | ICD-10-CM

## 2013-06-16 DIAGNOSIS — R0609 Other forms of dyspnea: Secondary | ICD-10-CM | POA: Insufficient documentation

## 2013-06-16 DIAGNOSIS — I517 Cardiomegaly: Secondary | ICD-10-CM

## 2013-06-16 DIAGNOSIS — R0989 Other specified symptoms and signs involving the circulatory and respiratory systems: Secondary | ICD-10-CM | POA: Insufficient documentation

## 2013-06-16 DIAGNOSIS — E785 Hyperlipidemia, unspecified: Secondary | ICD-10-CM | POA: Insufficient documentation

## 2013-06-16 DIAGNOSIS — I10 Essential (primary) hypertension: Secondary | ICD-10-CM | POA: Insufficient documentation

## 2013-06-16 DIAGNOSIS — R5381 Other malaise: Secondary | ICD-10-CM

## 2013-06-16 NOTE — Progress Notes (Signed)
  Echocardiogram 2D Echocardiogram has been performed.  Kobe Jansma 06/16/2013, 1:52 PM

## 2013-06-30 ENCOUNTER — Ambulatory Visit (INDEPENDENT_AMBULATORY_CARE_PROVIDER_SITE_OTHER): Payer: 59 | Admitting: Emergency Medicine

## 2013-06-30 VITALS — BP 121/67 | HR 76 | Temp 97.6°F | Wt 156.0 lb

## 2013-06-30 DIAGNOSIS — I5030 Unspecified diastolic (congestive) heart failure: Secondary | ICD-10-CM | POA: Insufficient documentation

## 2013-06-30 DIAGNOSIS — I5032 Chronic diastolic (congestive) heart failure: Secondary | ICD-10-CM

## 2013-06-30 DIAGNOSIS — R5381 Other malaise: Secondary | ICD-10-CM

## 2013-06-30 DIAGNOSIS — R5383 Other fatigue: Secondary | ICD-10-CM

## 2013-06-30 MED ORDER — LISINOPRIL 2.5 MG PO TABS: 2.5000 mg | ORAL_TABLET | Freq: Every day | ORAL | Status: DC

## 2013-06-30 NOTE — Assessment & Plan Note (Signed)
Echo showed diastolic dysfunction which could be contributing. I suspect it is more likely related to sleep disorder. Will order sleep latency study. Follow up in 4 weeks.

## 2013-06-30 NOTE — Patient Instructions (Signed)
It was nice to see you!  The echo of your heart showed your heart has some trouble relaxing.  This is called diastolic heart failure and yours is very mild. We are going to start a low dose of a medicine called lisinopril to help prevent it from worsening. Please stop the potassium pills.  The sleep study was concerning for sleep disorder like narcolepsy (this is where people fall asleep really easily). I have ordered a second sleep study to further evaluate this.  Keep exercising as much as you can, but you will likely need to take frequent breaks.  Follow up in 4 weeks.

## 2013-06-30 NOTE — Assessment & Plan Note (Signed)
Grade 1 per echo. Will start low dose lisinopril 2.5mg  daily.   Stop potassium supplements.

## 2013-06-30 NOTE — Progress Notes (Signed)
  Subjective:    Patient ID: Kathryn Hendricks, female    DOB: [birth year], 57 y.o.   MRN: [id number]  HPI KYLEE UMANA is here for results review.  She has had some chronic fatigue.  Reports falling asleep really easily.  Has some morning stiffness and also describes some periods where where her "muscles don't seem to work" right away after waking.  Also having some memory problems.  Reports some palpitations and brief dizzy spells after exertion.  I have reviewed and updated the following as appropriate: allergies and current medications SHx: non smoker  I have personally reviewed and discussed the echocardiogram and sleep study results with the patient  Review of Systems See HPI    Objective:   Physical Exam BP 121/67  Pulse 76  Temp(Src) 97.6 F (36.4 C) (Oral)  Wt 156 lb (70.761 kg) Gen: alert, cooperative, NAD     Assessment & Plan:  I spent 25 minutes with the patient, > 50% spent in counseling the patient.

## 2013-07-02 ENCOUNTER — Other Ambulatory Visit: Payer: Self-pay

## 2013-08-04 ENCOUNTER — Ambulatory Visit (HOSPITAL_BASED_OUTPATIENT_CLINIC_OR_DEPARTMENT_OTHER): Payer: 59 | Attending: Family Medicine | Admitting: Radiology

## 2013-08-04 VITALS — Ht 64.0 in | Wt 155.0 lb

## 2013-08-04 DIAGNOSIS — G4710 Hypersomnia, unspecified: Secondary | ICD-10-CM

## 2013-08-04 DIAGNOSIS — R5381 Other malaise: Secondary | ICD-10-CM

## 2013-08-04 DIAGNOSIS — R5383 Other fatigue: Secondary | ICD-10-CM

## 2013-08-05 NOTE — Addendum Note (Signed)
Addended by: Clemens Catholic on: 08/05/2013 02:10 PM   Modules accepted: Level of Service

## 2013-08-15 DIAGNOSIS — G4710 Hypersomnia, unspecified: Secondary | ICD-10-CM

## 2013-08-15 DIAGNOSIS — R5381 Other malaise: Secondary | ICD-10-CM

## 2013-08-15 DIAGNOSIS — G4730 Sleep apnea, unspecified: Secondary | ICD-10-CM

## 2013-08-15 DIAGNOSIS — R5383 Other fatigue: Secondary | ICD-10-CM

## 2013-08-15 NOTE — Sleep Study (Signed)
    NAME: Kathryn Hendricks DATE OF BIRTH:  09/30/1955 MEDICAL RECORD NUMBER [id number]  LOCATION: [facility]  PHYSICIAN: YOUNG,CLINTON D  DATE OF STUDY: 08/04/2013  SLEEP STUDY TYPE: Multiple Sleep Latency Test               REFERRING PHYSICIAN: Chambliss, Marshall L, *  INDICATION FOR STUDY: Hypersomnia with question of narcolepsy. A nocturnal polysomnogram on 06/09/2013 recorded AHI 3.8 per hour, Epworth score 24/24, 29.5% of time spent in rem. Body weight was 157 pounds. No bedtime medications were taken for that study.  EPWORTH SLEEPINESS SCORE:   24/24 HEIGHT: 5\' 4"  (162.6 cm)  WEIGHT: 155 lb (70.308 kg)    Body mass index is 26.59 kg/(m^2).  NECK SIZE: 12 in.  MEDICATIONS: Charted for review.  NAP 1: 8:15 AM. Sleep latency 0, REM latency N/A  NAP 2: 10:15 AM. Sleep latency 1 minute, REM latency N./A.  NAP 3: 12:15 PM. Sleep latency 0, REM latency 7.5 minutes  NAP 4: Sleep latency 1.5 minutes. REM latency N/A  NAP 5: Sleep latency 0.5 minutes, REM latency 8 minutes   MEAN SLEEP LATENCY: 0.6 minutes  NUMBER OF REM EPISODES:  2/5 naps  COMMENTS:   1) Pathological daytime sleepiness. A normal mean sleep latency is considered greater than 10 minutes and pathologic less than 5 minutes. REM sleep on 2 of 5 naps is strongly consistent with a diagnosis of narcolepsy. 2) This study is interpreted without information about the preceding night-sleep quality or medications taken. No medication was taken during this study. A baseline nocturnal polysomnogram on 06/09/2013 had recorded AHI 3.8 per hour, within normal limits, with 29.5% of the sleep time in REM.  IMPRESSION/ RECOMMENDATION:  Probable narcolepsy. See comments above.  Signed Jetty Duhamel M.D. Waymon Budge Diplomate, American Board of Sleep Medicine  ELECTRONICALLY SIGNED ON:  08/15/2013, 9:50 AM [facility] PH: [phone]   FX: [phone] ACCREDITED BY THE  AMERICAN ACADEMY OF SLEEP MEDICINE

## 2013-08-17 ENCOUNTER — Ambulatory Visit (INDEPENDENT_AMBULATORY_CARE_PROVIDER_SITE_OTHER): Payer: 59 | Admitting: Emergency Medicine

## 2013-08-17 ENCOUNTER — Encounter: Payer: Self-pay | Admitting: Emergency Medicine

## 2013-08-17 VITALS — BP 110/72 | HR 76 | Ht 64.0 in | Wt 157.0 lb

## 2013-08-17 DIAGNOSIS — G47419 Narcolepsy without cataplexy: Secondary | ICD-10-CM

## 2013-08-17 MED ORDER — POTASSIUM CHLORIDE ER 10 MEQ PO TBCR: 10.0000 meq | EXTENDED_RELEASE_TABLET | Freq: Every day | ORAL | Status: DC

## 2013-08-17 MED ORDER — MODAFINIL 200 MG PO TABS: 200.0000 mg | ORAL_TABLET | Freq: Every day | ORAL | Status: DC

## 2013-08-17 MED ORDER — HYDROCHLOROTHIAZIDE 25 MG PO TABS: 25.0000 mg | ORAL_TABLET | Freq: Every day | ORAL | Status: DC

## 2013-08-17 NOTE — Patient Instructions (Signed)
It was nice to see you!  You have a sleep disorder called Narcolepsy.   Take provigil 1 pill IN THE MORNING every day.  Follow up with me in 1 month.  Narcolepsy Narcolepsy is a disabling neurological disorder of sleep regulation. It affects the control of sleep. It also affects the control of wakefulness. It is an interruption of the dreaming state of sleep. This state is known as REM or rapid eye movement sleep.  SYMPTOMS  The development, number, and severity of symptoms vary widely among people with the disorder. Symptoms generally begin between the ages of 84 and 59. The four classic symptoms of the disorder are:   Excessive daytime sleepiness.  Cataplexy. This is sudden, brief episodes of muscle weakness or paralysis. It is caused by strong emotions. Common strong emotions are laughter, anger, surprise, or anticipation.  Sleep paralysis. This is paralysis upon falling asleep or waking up.  Hallucinations. These are vivid dream-like images that occur at when you first fall asleep. Other symptoms include:   Unrelenting excessive sleepiness. This is usually the first and most obvious symptom.  Sleep attacks. Patients have strong sleep attacks throughout the day. These attacks can last for 30 seconds to more than 30 minutes. These happen no matter how much or how well the person slept the night before. These attacks end up making the person sleep at work and social events. The person can fall asleep while eating, talking, and driving. They also fall asleep at other out of place times.  Disturbed nighttime sleep.  Tossing and turning in bed.  Leg jerks.  Nightmares.  Waking up often. DIAGNOSIS  It's possible that genetics play a role in this disorder. Narcolepsy is not a rare disorder. It is often misdiagnosed. It is often diagnosed years after symptoms first appear. Early diagnosis and treatment are important. This help the physical and mental well-being of the  patient. TREATMENT  There is no cure for narcolepsy. The symptoms can be controlled with behavioral and medical therapy. The excessive daytime sleepiness may be treated with stimulant drugs. It may also be treated with the drug modafinil (Provigil). Cataplexy and other REM-sleep symptoms may be treated with antidepressant medications. Medications will reduce the symptoms. Medications will not ease symptoms entirely. Many available medications have side effects. Basic lifestyle changes may also reduce the symptoms. These changes include having regular sleep schedules and scheduled daytime naps. Other lifestyle changes include avoiding "over-stimulating" situations. Document Released: 08/03/2002 Document Revised: 11/05/2011 Document Reviewed: 08/13/2005 [facility] Patient Information 2014 [place], [place].

## 2013-08-18 NOTE — Assessment & Plan Note (Signed)
Consistent with history and sleep studies. Will treat with provigil 200mg  qAM. Follow up in 1 month.

## 2013-08-18 NOTE — Progress Notes (Signed)
   Subjective:    Patient ID: CHAKIA COUNTS, female    DOB: 03/06/1956, 57 y.o.   MRN: [id number]  HPI LANORA REVERON is here for results review.  She had a sleep latency study 2 weeks ago that was consistent with narcolepsy.  We reviewed and results and came with a treatment plan as in problem list.  She also reports worsening leg cramps since stopping the K-dur.  She would like to restart some potassium supplementation.  I have reviewed and updated the following as appropriate: allergies, current medications and problem list SHx: non smoker  Health Maintenance: has received flu shot this season   Review of Systems See HPI    Objective:   Physical Exam BP 110/72  Pulse 76  Ht 5\' 4"  (1.626 m)  Wt 157 lb (71.215 kg)  BMI 26.94 kg/m2 Gen: alert, cooperative, NAD     Assessment & Plan:  Leg cramps: will restart K-dur at 1/2 her prior dose as she is now on low-dose lisinopril.

## 2013-08-22 ENCOUNTER — Telehealth: Payer: Self-pay | Admitting: Family Medicine

## 2013-08-22 NOTE — Telephone Encounter (Signed)
Pt calling to say that she has started the medication for her sleep disorder and thinks that it worsening her condition. She took in Tuesday morning and this morning. She states that she is very fatigued today and cannot clean her house. She was at work yesterday and has shortness of breath with ambulation, which also concerned her. She denied chest pain, fever, and chills. I instructed her to stop the provigil and to rest today. If the still feels has similar symptoms tomorrow she should be evaluated at urgent care or ED. If symptoms worsen, then come in sooner. If symptoms improve, then see Dr. Piedad Climes in clinic this week. Pt agreeable with this plan.

## 2013-08-24 ENCOUNTER — Telehealth: Payer: Self-pay | Admitting: Emergency Medicine

## 2013-08-24 NOTE — Telephone Encounter (Signed)
Pt called because the medication she was prescribe to take so that she could stay awake is making her more sleepy and she is also having chest pains.She called over the weekend and talked to the doctor on call and was told to stop taking the medication and call Dr. Piedad Climes on Monday. Please call an advise. jw

## 2013-08-25 NOTE — Telephone Encounter (Signed)
Spoke with patient.  Since she has stopped taking the provigil, she has not had any symptoms of increased drowsiness or chest pains.  I advised patient to schedule follow up with Dr. Piedad Climes and to NOT take that medication until she is seen again.  Pt verbalized understanding.  Johnnette Laux, [name], CMA

## 2013-09-10 ENCOUNTER — Ambulatory Visit (INDEPENDENT_AMBULATORY_CARE_PROVIDER_SITE_OTHER): Payer: 59 | Admitting: Emergency Medicine

## 2013-09-10 ENCOUNTER — Encounter: Payer: Self-pay | Admitting: Emergency Medicine

## 2013-09-10 VITALS — BP 115/79 | HR 58 | Ht 64.0 in | Wt 155.0 lb

## 2013-09-10 DIAGNOSIS — I10 Essential (primary) hypertension: Secondary | ICD-10-CM

## 2013-09-10 DIAGNOSIS — G47419 Narcolepsy without cataplexy: Secondary | ICD-10-CM

## 2013-09-10 DIAGNOSIS — E7800 Pure hypercholesterolemia, unspecified: Secondary | ICD-10-CM

## 2013-09-10 LAB — COMPLETE METABOLIC PANEL WITH GFR
ALT: 30 U/L (ref 0–35)
AST: 24 U/L (ref 0–37)
Albumin: 4.4 g/dL (ref 3.5–5.2)
Alkaline Phosphatase: 86 U/L (ref 39–117)
BUN: 11 mg/dL (ref 6–23)
CALCIUM: 9.3 mg/dL (ref 8.4–10.5)
CO2: 32 meq/L (ref 19–32)
Chloride: 102 mEq/L (ref 96–112)
Creat: 0.57 mg/dL (ref 0.50–1.10)
GFR, Est Non African American: >89 mL/min
Glucose, Bld: 84 mg/dL (ref 70–99)
Potassium: 3.8 mEq/L (ref 3.5–5.3)
SODIUM: 142 meq/L (ref 135–145)
TOTAL PROTEIN: 6.9 g/dL (ref 6.0–8.3)
Total Bilirubin: 0.3 mg/dL (ref 0.3–1.2)

## 2013-09-10 LAB — LIPID PANEL
Cholesterol: 178 mg/dL (ref 0–200)
HDL: 59 mg/dL (ref >39)
LDL CALC: 105 mg/dL — AB (ref 0–99)
TRIGLYCERIDES: 70 mg/dL (ref <150)
Total CHOL/HDL Ratio: 3 Ratio
VLDL: 14 mg/dL (ref 0–40)

## 2013-09-10 MED ORDER — METHYLPHENIDATE HCL 5 MG PO TABS: 5.0000 mg | ORAL_TABLET | Freq: Two times a day (BID) | ORAL | Status: DC

## 2013-09-10 NOTE — Assessment & Plan Note (Addendum)
Stop provigil. Will try low dose Ritalin 5mg  BID.  Discussed starting it once a day. If side effects she is to stop the medication. If has recurrent palpitations or chest pain, will likely need referral for cardiac evaluation. Also discussed keeping driving to a minimum, having a passenger present, and no drives over 30 minutes. Follow up in 2-4 weeks.

## 2013-09-10 NOTE — Patient Instructions (Signed)
It was nice to see you!  We are going to try a different medicine called methylphenidate to help you have more energy during the day. Try it on Tuesday morning.  Take 1 pill in the morning with a little food. If it makes you feel terrible, do not take anymore.  It should wear off in 4-5 hours. If you do well with the medicine, you can take it every morning with a little food.  Stop the Hydrochlorothiazide.  Please drop of the FMLA paperwork so I can complete it for you. Follow up in 2-4 weeks to see how you are doing.

## 2013-09-10 NOTE — Assessment & Plan Note (Signed)
Will stop HCTZ as her BP has been well below goal the last few visits. Continue the lisinopril.

## 2013-09-10 NOTE — Progress Notes (Signed)
   Subjective:    Patient ID: Kathryn Hendricks, female    DOB: [birth year], 58 y.o.   MRN: [id number]  HPI Kathryn Hendricks is here for f/u narcolepsy.  She was diagnosed with narcolepsy based on sleep latency.  She was started on Provigil, but experienced palpitations and chest pain when on the medication.  She has stopped the Provigil.  Reports continued trouble with staying awake during the day, particularly during church.  She states she is back to her baseline since stopping the Provigil.  I have reviewed and updated the following as appropriate: allergies and current medications SHx: non smoker   Review of Systems See HPI    Objective:   Physical Exam BP 115/79  Pulse 58  Ht 5\' 4"  (1.626 m)  Wt 155 lb (70.308 kg)  BMI 26.59 kg/m2 Gen: alert, cooperative, NAD HEENT: AT/[place], sclera white, MMM Neck: supple CV: RRR, no murmurs Pulm: CTAB, no wheezes or rales      Assessment & Plan:

## 2013-09-10 NOTE — Assessment & Plan Note (Signed)
Recheck lipids, CMP today as it has been over 1 year on medication.

## 2013-10-02 ENCOUNTER — Other Ambulatory Visit: Payer: Self-pay | Admitting: Emergency Medicine

## 2013-10-08 ENCOUNTER — Encounter: Payer: Self-pay | Admitting: Emergency Medicine

## 2013-10-08 ENCOUNTER — Ambulatory Visit (INDEPENDENT_AMBULATORY_CARE_PROVIDER_SITE_OTHER): Payer: 59 | Admitting: Emergency Medicine

## 2013-10-08 ENCOUNTER — Telehealth: Payer: Self-pay | Admitting: Emergency Medicine

## 2013-10-08 VITALS — BP 135/60 | HR 67 | Ht 64.0 in | Wt 159.9 lb

## 2013-10-08 DIAGNOSIS — G47419 Narcolepsy without cataplexy: Secondary | ICD-10-CM

## 2013-10-08 NOTE — Telephone Encounter (Signed)
Patient left FMLA paperwork.  I will fill out and call her when it is ready.

## 2013-10-08 NOTE — Patient Instructions (Signed)
It was nice to see you!  I put in a referral to a cardiologist to evaluate your heart since you cannot take the ritalin without side effects. I also put in a referral to someone who specializes in sleep.   You should hear from our office in the next 1-2 weeks to set up these appointments.  If you haven't heard from [place] in 2 weeks, give [name] call.  Follow up with me in 1 month or sooner as needed.

## 2013-10-08 NOTE — Assessment & Plan Note (Signed)
Ritalin not helping and having shortness of breath and palpitations. Will refer to cardiology for stress test. Will refer to sleep specialist at [facility] for further management. Follow up with me in 1 month.

## 2013-10-08 NOTE — Progress Notes (Signed)
   Subjective:    Patient ID: Kathryn Hendricks, female    DOB: [birth year], 58 y.o.   MRN: [id number]  HPI Kathryn Hendricks is here for f/u narcolepsy.  She states that she has been taking the ritalin but it is not helping.  She continues to fall asleep rapidly and it is impacting her work and personal life.  She also states that she will feel short of breath and have palpitations on the ritalin.  Current Outpatient Prescriptions on File Prior to Visit  Medication Sig Dispense Refill  . aspirin 81 MG EC tablet Take 81 mg by mouth daily.       . diclofenac sodium (VOLTAREN) 1 % GEL Apply 1 application topically 4 (four) times daily as needed. 1 gram to affected area for pain      . lisinopril (PRINIVIL,ZESTRIL) 2.5 MG tablet Take 1 tablet (2.5 mg total) by mouth daily.  30 tablet  3  . loratadine (CLARITIN) 10 MG tablet Take 10 mg by mouth daily.        Marland Kitchen omeprazole (PRILOSEC) 40 MG capsule Take 1 capsule (40 mg total) by mouth daily.  30 capsule  3  . potassium chloride (K-DUR) 10 MEQ tablet Take 1 tablet (10 mEq total) by mouth daily.  30 tablet  3  . simvastatin (ZOCOR) 40 MG tablet TAKE 1 TABLET BY MOUTH DAILY.  30 tablet  6   No current facility-administered medications on file prior to visit.    I have reviewed and updated the following as appropriate: allergies and current medications SHx: non smoker  Review of Systems See HPI    Objective:   Physical Exam BP 135/60  Pulse 67  Ht 5\' 4"  (1.626 m)  Wt 159 lb 14.4 oz (72.53 kg)  BMI 27.43 kg/m2 Gen: alert, cooperative, NAD, yawning a lot     Assessment & Plan:

## 2013-10-09 NOTE — Telephone Encounter (Signed)
Pt notified.  Bassheva Flury L, CMA  

## 2013-10-09 NOTE — Telephone Encounter (Signed)
FMLA paperwork completed and placed up front for patient pick up.

## 2013-10-26 ENCOUNTER — Ambulatory Visit (INDEPENDENT_AMBULATORY_CARE_PROVIDER_SITE_OTHER): Payer: 59 | Admitting: Cardiovascular Disease

## 2013-10-26 ENCOUNTER — Encounter: Payer: Self-pay | Admitting: Cardiovascular Disease

## 2013-10-26 VITALS — BP 112/70 | HR 65 | Ht 64.0 in | Wt 157.8 lb

## 2013-10-26 DIAGNOSIS — R002 Palpitations: Secondary | ICD-10-CM

## 2013-10-26 DIAGNOSIS — R079 Chest pain, unspecified: Secondary | ICD-10-CM

## 2013-10-26 NOTE — Patient Instructions (Addendum)
Your physician recommends that you schedule a follow-up appointment in: 4-6 weeks.   Your physician has recommended that you wear a holter monitor. Holter monitors are medical devices that record the heart's electrical activity. Doctors most often use these monitors to diagnose arrhythmias. Arrhythmias are problems with the speed or rhythm of the heartbeat. The monitor is a small, portable device. You can wear one while you do your normal daily activities. This is usually used to diagnose what is causing palpitations/syncope (passing out).  Your physician has requested that you have an exercise tolerance test. Can be done with NP or PA.  For further information please visit [email or web address]. Please also follow instruction sheet, as given.

## 2013-10-26 NOTE — Progress Notes (Signed)
History of Present Illness: 58 yo female with history of HTN, HLD, GERD, narcolepsy who is here today as a new patient for cardiovascular examination and to discuss recent dyspnea and palpitations while taking Ritalin. She has no prior documented cardiac disease. Echo 06/16/13 with normal LV size and function, left atrial enlargement. She tells me that she has had episodes her heart racing. This has been occurring for over a year. The palpitations worsened while taking Ritalin. She says her heart races every day. She also notes heartburn. She takes a PPI. She has occasional exertional chest pain. No SOB, near syncope or syncope.   Primary Care Physician: Bridgett Larsson [facility])  Last Lipid Profile:Lipid Panel     Component Value Date/Time   CHOL 178 09/10/2013 1136   TRIG 70 09/10/2013 1136   HDL 59 09/10/2013 1136   CHOLHDL 3.0 09/10/2013 1136   VLDL 14 09/10/2013 1136   LDLCALC 105* 09/10/2013 1136     Past Medical History  Diagnosis Date  . Hypertension   . Hyperlipidemia   . Abnormal Pap smear 2011    Past Surgical History  Procedure Laterality Date  . Appendectomy    . Tubal ligation    . Hysteroscopy w/ endometrial ablation  2010  . Dilation and curettage of uterus  2010  . Wisdom tooth extraction    . Colonoscopy      Current Outpatient Prescriptions  Medication Sig Dispense Refill  . aspirin 81 MG EC tablet Take 81 mg by mouth daily.       . diclofenac sodium (VOLTAREN) 1 % GEL Apply 1 application topically 4 (four) times daily as needed. 1 gram to affected area for pain      . lisinopril (PRINIVIL,ZESTRIL) 2.5 MG tablet Take 1 tablet (2.5 mg total) by mouth daily.  30 tablet  3  . loratadine (CLARITIN) 10 MG tablet Take 10 mg by mouth daily.        Marland Kitchen omeprazole (PRILOSEC) 40 MG capsule Take 1 capsule (40 mg total) by mouth daily.  30 capsule  3  . potassium chloride (K-DUR) 10 MEQ tablet Take 1 tablet (10 mEq total) by mouth daily.  30 tablet  3  .  simvastatin (ZOCOR) 40 MG tablet TAKE 1 TABLET BY MOUTH DAILY.  30 tablet  6   No current facility-administered medications for this visit.    Allergies  Allergen Reactions  . Penicillins Shortness Of Breath, Itching and Swelling    History   Social History  . Marital Status: Divorced    Spouse Name: N/A    Number of Children: 3  . Years of Education: N/A   Occupational History  . [place] History Main Topics  . Smoking status: Never Smoker   . Smokeless tobacco: Not on file  . Alcohol Use: No  . Drug Use: No  . Sexual Activity: No   Other Topics Concern  . Not on file   Social History Narrative  . No narrative on file    Family History  Problem Relation Age of Onset  . Hypertension Mother   . Diabetes Mother   . Stroke Mother   . Hypertension Sister   . Hypertension Brother   . Cancer Brother     throat  . Diabetes Father   . Heart disease Father     Review of Systems:  As stated in the HPI and otherwise negative.   BP 112/70  Pulse 65  Ht 5\' 4"  (1.626 m)  Wt 157 lb 12.8 oz (71.578 kg)  BMI 27.07 kg/m2  Physical Examination: General: Well developed, well nourished, NAD HEENT: OP clear, mucus membranes moist SKIN: warm, dry. No rashes. Neuro: No focal deficits Musculoskeletal: Muscle strength 5/5 all ext Psychiatric: Mood and affect normal Neck: No JVD, no carotid bruits, no thyromegaly, no lymphadenopathy. Lungs:Clear bilaterally, no wheezes, rhonci, crackles Cardiovascular: Regular rate and rhythm. No murmurs, gallops or rubs. Abdomen:Soft. Bowel sounds present. Non-tender.  Extremities: No lower extremity edema. Pulses are 2 + in the bilateral DP/PT.  Echo 06/16/13:  Left ventricle: The cavity size was normal. Wall thickness was normal. Systolic function was vigorous. The estimated ejection fraction was in the range of 60% to 70%. Wall motion was normal; there were no regional wall motion abnormalities. Doppler  parameters are consistent with abnormal left ventricular relaxation (grade 1 diastolic dysfunction). - Left atrium: The atrium was mildly to moderately dilated.  EKG: NSR, rate 65 bpm.   Assessment and Plan:   1. Palpitations: She has daily palpitations. EKG is normal today. Will arrange 48 hour monitor to assess. Echo with normal LV function in October 2014.    2. Chest pain: Her chest pain is atypical. Her risk factors for CAD include HTN, HLD, FH of CAD. Will arrange exercise treadmill stress test to exclude ischemia.

## 2013-10-27 ENCOUNTER — Encounter: Payer: Self-pay | Admitting: *Deleted

## 2013-10-27 ENCOUNTER — Encounter (INDEPENDENT_AMBULATORY_CARE_PROVIDER_SITE_OTHER): Payer: 59

## 2013-10-27 DIAGNOSIS — R002 Palpitations: Secondary | ICD-10-CM

## 2013-10-27 NOTE — Progress Notes (Signed)
Patient ID: Kathryn Hendricks, female   DOB: Apr 09, 1956, 58 y.o.   MRN: [id number] [employer] 48 hour holter monitor applied to patient.

## 2013-11-02 ENCOUNTER — Telehealth: Payer: Self-pay | Admitting: Emergency Medicine

## 2013-11-02 ENCOUNTER — Other Ambulatory Visit: Payer: Self-pay

## 2013-11-02 ENCOUNTER — Other Ambulatory Visit: Payer: Self-pay | Admitting: Emergency Medicine

## 2013-11-02 DIAGNOSIS — Z1231 Encounter for screening mammogram for malignant neoplasm of breast: Secondary | ICD-10-CM

## 2013-11-02 NOTE — Telephone Encounter (Signed)
Pt brought FMLA form and I have it. I know, what to correct on it. Please see me. Thanks. Kathryn Hendricks, Gerrit Heck

## 2013-11-02 NOTE — Telephone Encounter (Signed)
Donna's # [phone]

## 2013-11-02 NOTE — Telephone Encounter (Signed)
Called Butch Penny at [facility] and left message to call back, or to fax the [facility] form with instructions to [place]. Waiting on call back. Javier Glazier, Gerrit Heck

## 2013-11-02 NOTE — Telephone Encounter (Signed)
Patient just received a call from [place] about her FMLA paperwork that was filled out recently. She states that Dennard Nip called stating that there is confusion with how the paperwork was completed. Also, patient states she does not want to be taken out of work for a long period of time, only when she has to have her treatments. Please call patient. Patient to call back with direct number to [facility] in [place].

## 2013-11-03 NOTE — Telephone Encounter (Signed)
Discussed with Thekla and corrections made.

## 2013-11-03 NOTE — Telephone Encounter (Signed)
Called pt. LMVM to pick up FMLA form. Javier Glazier, Gerrit Heck

## 2013-11-04 ENCOUNTER — Telehealth: Payer: Self-pay | Admitting: *Deleted

## 2013-11-04 NOTE — Telephone Encounter (Signed)
LMVM for pt to pick up FMLA form.  [place], Loralyn Freshwater, [place]

## 2013-11-04 NOTE — Telephone Encounter (Signed)
I placed call to pt to review monitor results. Left message to call back 

## 2013-11-05 ENCOUNTER — Telehealth (HOSPITAL_COMMUNITY): Payer: Self-pay

## 2013-11-10 ENCOUNTER — Ambulatory Visit (HOSPITAL_COMMUNITY)
Admission: RE | Admit: 2013-11-10 | Discharge: 2013-11-10 | Disposition: A | Discharge status: Home / Self Care | Payer: 59 | Source: Ambulatory Visit | Attending: Cardiovascular Disease | Admitting: Cardiovascular Disease

## 2013-11-10 ENCOUNTER — Other Ambulatory Visit (HOSPITAL_COMMUNITY): Payer: Self-pay | Admitting: Cardiovascular Disease

## 2013-11-10 DIAGNOSIS — R002 Palpitations: Secondary | ICD-10-CM

## 2013-11-10 DIAGNOSIS — R079 Chest pain, unspecified: Secondary | ICD-10-CM | POA: Insufficient documentation

## 2013-11-16 NOTE — Telephone Encounter (Signed)
Note below copied from message from Dr. Angelena Form. I placed call to pt to review results. Left message to call back     Lamar Laundry, MD     Sent: Thu November 12, 2013  4:03 PM      To: Thompson Grayer, RN           Message      Stress test was inconclusive as she did not reach target heart rate. Can we let her know and if she is still having issues with chest pain, I would recommend a Lexiscan stress myoview to exclude ischemia. Gerald Stabs

## 2013-11-19 ENCOUNTER — Encounter: Payer: Self-pay | Admitting: Pulmonary Disease

## 2013-11-19 ENCOUNTER — Ambulatory Visit (INDEPENDENT_AMBULATORY_CARE_PROVIDER_SITE_OTHER): Payer: 59 | Admitting: Pulmonary Disease

## 2013-11-19 VITALS — BP 138/82 | HR 65 | Temp 98.7°F | Ht 64.0 in | Wt 161.8 lb

## 2013-11-19 DIAGNOSIS — G47419 Narcolepsy without cataplexy: Secondary | ICD-10-CM

## 2013-11-19 NOTE — Progress Notes (Deleted)
   Subjective:    Patient ID: Kathryn Hendricks, female    DOB: [birth year], 57 y.o.   MRN: [id number]  HPI    Review of Systems  Constitutional: Positive for unexpected weight change. Negative for fever.  HENT: Positive for congestion, rhinorrhea and sinus pressure. Negative for dental problem, ear pain, nosebleeds, postnasal drip, sneezing, sore throat and trouble swallowing.   Eyes: Negative for redness and itching.  Respiratory: Positive for cough, chest tightness and shortness of breath. Negative for wheezing.   Cardiovascular: Negative for palpitations.  Gastrointestinal: Negative for nausea and vomiting.  Genitourinary: Negative for dysuria.  Musculoskeletal: Negative for joint swelling.  Skin: Negative for rash.  Neurological: Negative for headaches.  Hematological: Does not bruise/bleed easily.  Psychiatric/Behavioral: Negative for dysphoric mood. The patient is not nervous/anxious.        Objective:   Physical Exam        Assessment & Plan:

## 2013-11-19 NOTE — Progress Notes (Signed)
Chief Complaint  Patient presents with  . Pulmonary Consult    Referred by Dr. Bridgett Larsson for narcolepsy. EPWORTH=24    History of Present Illness: Kathryn Hendricks is a 58 y.o. female for evaluation of sleep problems.  She has noticed trouble with feeling sleepy since being a teen ager.  She uses to fall asleep in class.  This trouble has persisted.  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  She will fall asleep at work if she does not keep moving.  As a result she does not allow herself to take breaks.  Her parents had similar difficulties staying awake.    She reports talking in her sleep, and snoring.  She does not give consistent history of cataplexy, sleep hallucinations, or sleep paralysis.   She goes to sleep at 8 pm.  She falls asleep quickly.  She wakes up one time to use the bathroom.  She gets out of bed at 530 am.  She feels tired in the morning.  She denies morning headache.  She does not use anything to help her fall sleep or stay awake.  She denies sleep walking, sleep talking, bruxism, or nightmares.  There is no history of restless legs.  She denies head injury, loss of consciousness, stroke, or seizures.  The Epworth score is 24 out of 24.  She had sleep study in October 2014 which was negative for sleep apnea.  She had MSLT in December 2014 which showed significant hypersomnia with SOREM in 2 out of 5 naps.  She was started on provigil and then ritalin >> both these caused severe palpitations.  She has been referred to cardiology for assessment of palpitations.  Tests: PSG 06/09/13 >> AHI 3.8, SpO2 low 92% Echo 06/16/13 >> EF 60 to 14%, grade 1 diastolic dysfx, mild/mod LA dilation MSLT 08/04/13 >> mean sleep latency 0.6 min, SOREM in 2/5 naps  Kathryn Hendricks  has a past medical history of Hypertension; Hyperlipidemia; Abnormal Pap smear (2011); and Heart failure.  Kathryn Hendricks  has past surgical history that includes Appendectomy; Tubal ligation; Hysteroscopy w/  endometrial ablation (2010); Dilation and curettage of uterus (2010); Wisdom tooth extraction; and Colonoscopy.  Prior to Admission medications   Medication Sig Start Date End Date Taking? Authorizing Provider  aspirin 81 MG EC tablet Take 81 mg by mouth daily.    Yes Historical Provider, MD  diclofenac sodium (VOLTAREN) 1 % GEL Apply 1 application topically 4 (four) times daily as needed. 1 gram to affected area for pain   Yes Historical Provider, MD  lisinopril (PRINIVIL,ZESTRIL) 2.5 MG tablet TAKE 1 TABLET BY MOUTH DAILY   Yes Melony Overly, MD  loratadine (CLARITIN) 10 MG tablet Take 10 mg by mouth daily.     Yes Historical Provider, MD  omeprazole (PRILOSEC) 40 MG capsule Take 1 capsule (40 mg total) by mouth daily. 05/07/13  Yes Melony Overly, MD  potassium chloride (K-DUR) 10 MEQ tablet Take 1 tablet (10 mEq total) by mouth daily. 08/17/13  Yes Melony Overly, MD  simvastatin (ZOCOR) 40 MG tablet TAKE 1 TABLET BY MOUTH DAILY. 10/02/13  Yes Melony Overly, MD    Allergies  Allergen Reactions  . Penicillins Shortness Of Breath, Itching and Swelling    Her family history includes CAD (age of onset: 33) in her mother; Cancer in her brother; Diabetes in her father and mother; Heart disease in her father; Hypertension in her brother, mother, and sister; Stroke in her mother.  She  reports that she has never smoked. She has never used smokeless tobacco. She reports that she does not drink alcohol or use illicit drugs.  Review of Systems  Constitutional: Positive for unexpected weight change. Negative for fever.  HENT: Positive for congestion, rhinorrhea and sinus pressure. Negative for dental problem, ear pain, nosebleeds, postnasal drip, sneezing, sore throat and trouble swallowing.   Eyes: Negative for redness and itching.  Respiratory: Positive for cough, chest tightness and shortness of breath. Negative for wheezing.   Cardiovascular: Negative for palpitations.  Gastrointestinal: Negative for  nausea and vomiting.  Genitourinary: Negative for dysuria.  Musculoskeletal: Negative for joint swelling.  Skin: Negative for rash.  Neurological: Negative for headaches.  Hematological: Does not bruise/bleed easily.  Psychiatric/Behavioral: Negative for dysphoric mood. The patient is not nervous/anxious.    Physical Exam:  General - No distress ENT - No sinus tenderness, no oral exudate, no LAN, no thyromegaly, TM clear, pupils equal/reactive, scalloped tongue Cardiac - s1s2 regular, no murmur, pulses symmetric Chest - No wheeze/rales/dullness, good air entry, normal respiratory excursion Back - No focal tenderness Abd - Soft, non-tender, no organomegaly, + bowel sounds Ext - No edema Neuro - Normal strength, cranial nerves intact Skin - No rashes Psych - Normal mood, and behavior  Assessment/plan:  Chesley Mires, M.D. Pager [phone]

## 2013-11-19 NOTE — Patient Instructions (Signed)
Follow up in 1 month   

## 2013-11-24 NOTE — Assessment & Plan Note (Signed)
Her symptoms description and time course is consistent with narcolepsy.  She does not however, give consistent history for cataplexy.  Her recent sleep study did not show evidence for alternative sleep disorders, and her MSLT is consistent with narcolepsy.  Reviewed her sleep study results with her.  Discussed the diagnosis of narcolepsy, and typical disease course.  Explained that daytime hypersomnolence is most consistent/persistent finding, while other findings (cataplexy/hallucinations/sleep paralysis) are more variable.  Discussed proper sleep hygiene, maintaining set sleep/wake time, allowing enough time to sleep, and trying to take 1 to 2 scheduled naps for 15 to 20 minutes every day as her work schedule allows.  Also discussed safe driving practices.  Typical therapy for daytime hypersomnolence in this situation is based on stimulant medication.  She experienced severe palpitations when tried on these before.  She is being evaluated by cardiology, and would defer retrying stimulant medications until after she has completed cardiology evaluation.  If she is found to have heart disease that limits her use of stimulant medications, then it will be very difficult to treat her daytime hypersomnolence.

## 2013-12-02 ENCOUNTER — Ambulatory Visit
Admission: RE | Admit: 2013-12-02 | Discharge: 2013-12-02 | Disposition: A | Discharge status: Home / Self Care | Payer: 59 | Source: Ambulatory Visit

## 2013-12-02 DIAGNOSIS — Z1231 Encounter for screening mammogram for malignant neoplasm of breast: Secondary | ICD-10-CM

## 2013-12-02 NOTE — Telephone Encounter (Signed)
Tried again to reach pt but mailbox is full

## 2013-12-08 ENCOUNTER — Encounter: Payer: Self-pay | Admitting: Cardiovascular Disease

## 2013-12-08 ENCOUNTER — Ambulatory Visit (INDEPENDENT_AMBULATORY_CARE_PROVIDER_SITE_OTHER): Payer: 59 | Admitting: Cardiovascular Disease

## 2013-12-08 VITALS — BP 130/90 | HR 60 | Ht 64.0 in | Wt 160.0 lb

## 2013-12-08 DIAGNOSIS — R079 Chest pain, unspecified: Secondary | ICD-10-CM

## 2013-12-08 DIAGNOSIS — R002 Palpitations: Secondary | ICD-10-CM

## 2013-12-08 NOTE — Progress Notes (Signed)
History of Present Illness: 58 yo female with history of HTN, HLD, GERD, narcolepsy who is here today for cardiac follow up. I saw her as a new patient 10/26/13 for cardiovascular examination and to discuss dyspnea and palpitations while taking Ritalin. She has no prior documented cardiac disease. Echo 06/16/13 with normal LV size and function, left atrial enlargement. She told me that she has had episodes her heart racing. This has been occurring for over a year. The palpitations worsened while taking Ritalin. She says her heart races every day. She also notes heartburn. She takes a PPI. She has occasional exertional chest pain. No SOB, near syncope or syncope. I arranged a 48 hour monitor and an exercise treadmill stress test. She did not reach target heart rate on her stress test. We tried to arrange a [employer] but we could not reach her. 48 hour monitor showed sinus with rare PACs and rare PVCs.   She is here today for follow up. She has rare palpitations. She has had no exertional chest pain. She thinks her chest pain is related to movement and muscular. She continues to describe daytime fatigue. She is being seen by Dr. Halford Chessman for narcolepsy. She does not wish to complete a Lexiscan myoview.   Primary Care Physician: Bridgett Larsson [facility])  Last Lipid Profile:Lipid Panel     Component Value Date/Time   CHOL 178 09/10/2013 1136   TRIG 70 09/10/2013 1136   HDL 59 09/10/2013 1136   CHOLHDL 3.0 09/10/2013 1136   VLDL 14 09/10/2013 1136   LDLCALC 105* 09/10/2013 1136     Past Medical History  Diagnosis Date  . Hypertension   . Hyperlipidemia   . Abnormal Pap smear 2011  . Heart failure     Past Surgical History  Procedure Laterality Date  . Appendectomy    . Tubal ligation    . Hysteroscopy w/ endometrial ablation  2010  . Dilation and curettage of uterus  2010  . Wisdom tooth extraction    . Colonoscopy      Current Outpatient Prescriptions  Medication Sig  Dispense Refill  . aspirin 81 MG EC tablet Take 81 mg by mouth daily.       . diclofenac sodium (VOLTAREN) 1 % GEL Apply 1 application topically 4 (four) times daily as needed. 1 gram to affected area for pain      . lisinopril (PRINIVIL,ZESTRIL) 2.5 MG tablet TAKE 1 TABLET BY MOUTH DAILY  30 tablet  3  . loratadine (CLARITIN) 10 MG tablet Take 10 mg by mouth daily.        Marland Kitchen omeprazole (PRILOSEC) 40 MG capsule Take 1 capsule (40 mg total) by mouth daily.  30 capsule  3  . potassium chloride (K-DUR) 10 MEQ tablet Take 1 tablet (10 mEq total) by mouth daily.  30 tablet  3  . simvastatin (ZOCOR) 40 MG tablet TAKE 1 TABLET BY MOUTH DAILY.  30 tablet  6   No current facility-administered medications for this visit.    Allergies  Allergen Reactions  . Penicillins Shortness Of Breath, Itching and Swelling    History   Social History  . Marital Status: Divorced    Spouse Name: N/A    Number of Children: 3  . Years of Education: N/A   Occupational History  . [employer] History Main Topics  . Smoking status: Never Smoker   . Smokeless tobacco: Never Used  . Alcohol Use: No  .  Drug Use: No  . Sexual Activity: No   Other Topics Concern  . Not on file   Social History Narrative  . No narrative on file    Family History  Problem Relation Age of Onset  . Hypertension Mother   . Diabetes Mother   . Stroke Mother   . Hypertension Sister   . Hypertension Brother   . Cancer Brother     throat  . Diabetes Father   . Heart disease Father   . CAD Mother 12    Review of Systems:  As stated in the HPI and otherwise negative.   BP 130/90  Pulse 60  Ht 5\' 4"  (1.626 m)  Wt 160 lb (72.576 kg)  BMI 27.45 kg/m2  Physical Examination: General: Well developed, well nourished, NAD HEENT: OP clear, mucus membranes moist SKIN: warm, dry. No rashes. Neuro: No focal deficits Musculoskeletal: Muscle strength 5/5 all ext Psychiatric: Mood and affect normal Neck:  No JVD, no carotid bruits, no thyromegaly, no lymphadenopathy. Lungs:Clear bilaterally, no wheezes, rhonci, crackles Cardiovascular: Regular rate and rhythm. No murmurs, gallops or rubs. Abdomen:Soft. Bowel sounds present. Non-tender.  Extremities: No lower extremity edema. Pulses are 2 + in the bilateral DP/PT.  Echo 06/16/13:  Left ventricle: The cavity size was normal. Wall thickness was normal. Systolic function was vigorous. The estimated ejection fraction was in the range of 60% to 70%. Wall motion was normal; there were no regional wall motion abnormalities. Doppler parameters are consistent with abnormal left ventricular relaxation (grade 1 diastolic dysfunction). - Left atrium: The atrium was mildly to moderately dilated.  EKG: NSR, rate 65 bpm.   Assessment and Plan:   1. Palpitations: She has rare palpitations, likely due to premature beats. 48 hour monitor showed PACs and PVCs. Echo with normal LV function in October 2014.  Likely that her medications for narcolepsy will worsen the premature beats.   2. Chest pain: Her chest pain is atypical and not likely cardiac. Her risk factors for CAD include HTN, HLD, FH of CAD. She did not reach target heart rate during her exercise stress test. She does not wish to arrange a Lexiscan stress myoview to exclude ischemia at this time. She will call with a change in her clinical status.

## 2013-12-08 NOTE — Telephone Encounter (Signed)
Pt saw Dr. McAlhany today 

## 2013-12-08 NOTE — Patient Instructions (Addendum)
Your physician wants you to follow-up in:  12 months. You will receive a reminder letter in the mail two months in advance. If you don't receive a letter, please call our office to schedule the follow-up appointment.  Your monitor showed some early beats.  We do not need to make any changes to your treatment plan at this time. Continue on your current medications.

## 2013-12-22 ENCOUNTER — Ambulatory Visit (INDEPENDENT_AMBULATORY_CARE_PROVIDER_SITE_OTHER): Payer: 59 | Admitting: Pulmonary Disease

## 2013-12-22 ENCOUNTER — Encounter: Payer: Self-pay | Admitting: Pulmonary Disease

## 2013-12-22 VITALS — BP 122/80 | HR 64 | Ht 64.0 in | Wt 163.0 lb

## 2013-12-22 DIAGNOSIS — G47419 Narcolepsy without cataplexy: Secondary | ICD-10-CM

## 2013-12-22 MED ORDER — MODAFINIL 100 MG PO TABS: 50.0000 mg | ORAL_TABLET | Freq: Every day | ORAL | Status: DC

## 2013-12-22 NOTE — Patient Instructions (Signed)
Modafanil (provigil) 100 mg pill >> take 1/2 pill daily in the morning.  Can take an additional 1/2 pill in the afternoon as needed Follow up in 6 weeks

## 2013-12-22 NOTE — Progress Notes (Signed)
Chief Complaint  Patient presents with  . Narcolepsy    reports that she is having a hard time concentrating on things.    History of Present Illness: YASAMIN KAREL is a 58 y.o. female narcolepsy w/o cataplexy.  She had f/u with cardiology.  Chest felt to be non cardiac.  She was noted to have PAC's and PVC's on holter monitor >> no other significant arrhythmias.  She continues to have trouble with daytime sleepiness, memory, and concentration.  Tests: PSG 06/09/13 >> AHI 3.8, SpO2 low 92% Echo 06/16/13 >> EF 60 to 50%, grade 1 diastolic dysfx, mild/mod LA dilation MSLT 08/04/13 >> mean sleep latency 0.6 min, SOREM in 2/5 naps  ALEXSYS ESKIN  has a past medical history of Hypertension; Hyperlipidemia; Abnormal Pap smear (2011); and Heart failure.  [name]  has past surgical history that includes Appendectomy; Tubal ligation; Hysteroscopy w/ endometrial ablation (2010); Dilation and curettage of uterus (2010); Wisdom tooth extraction; and Colonoscopy.  Prior to Admission medications   Medication Sig Start Date End Date Taking? Authorizing Provider  aspirin 81 MG EC tablet Take 81 mg by mouth daily.    Yes Historical Provider, MD  diclofenac sodium (VOLTAREN) 1 % GEL Apply 1 application topically 4 (four) times daily as needed. 1 gram to affected area for pain   Yes Historical Provider, MD  lisinopril (PRINIVIL,ZESTRIL) 2.5 MG tablet TAKE 1 TABLET BY MOUTH DAILY   Yes Melony Overly, MD  loratadine (CLARITIN) 10 MG tablet Take 10 mg by mouth daily.     Yes Historical Provider, MD  omeprazole (PRILOSEC) 40 MG capsule Take 1 capsule (40 mg total) by mouth daily. 05/07/13  Yes Melony Overly, MD  potassium chloride (K-DUR) 10 MEQ tablet Take 1 tablet (10 mEq total) by mouth daily. 08/17/13  Yes Melony Overly, MD  simvastatin (ZOCOR) 40 MG tablet TAKE 1 TABLET BY MOUTH DAILY. 10/02/13  Yes Melony Overly, MD    Allergies  Allergen Reactions  . Penicillins Shortness Of Breath, Itching  and Swelling   Physical Exam:  General - No distress ENT - No sinus tenderness, no oral exudate, no LAN, no thyromegaly, TM clear, pupils equal/reactive, scalloped tongue Cardiac - s1s2 regular, no murmur Chest - No wheeze/rales/dullness Back - No focal tenderness Abd - Soft, non-tender Ext - No edema Neuro - Normal strength Skin - No rashes Psych - Normal mood, and behavior  Assessment/plan:  Chesley Mires, M.D. Pager [phone]

## 2013-12-22 NOTE — Assessment & Plan Note (Addendum)
Will try her again on modafinil.  Will start on 50 mg dose in am and additional dose in pm if needed.  Discussed side effects to monitor for.  Will f/u in 6 weeks, assess her tolerance of medication, and then adjust dose as needed.   Discussed proper sleep hygiene, maintaining set sleep/wake time, allowing enough time to sleep, and trying to take 1 to 2 scheduled naps for 15 to 20 minutes every day as her work schedule allows.  Also discussed safe driving practices.

## 2014-01-29 ENCOUNTER — Telehealth: Payer: Self-pay | Admitting: Pulmonary Disease

## 2014-01-29 MED ORDER — MODAFINIL 100 MG PO TABS: 100.0000 mg | ORAL_TABLET | Freq: Two times a day (BID) | ORAL | Status: DC

## 2014-01-29 NOTE — Telephone Encounter (Signed)
This has been called in to the pharmacy.  Nothing further needed.

## 2014-01-29 NOTE — Telephone Encounter (Signed)
Okay to send refill for provigil 100 mg bid.  Dispense 60 pills with 3 refills.

## 2014-01-29 NOTE — Telephone Encounter (Signed)
Spoke with the pharmacy and they stated that the pt is there to get an early refill of the provigil.  The sig for the rx is   100 mg  1/2 tablet by mouth daily but the pt told the pharmacy that she is having a hard time staying awake while driving so she has been taking a full tablet and is now out of her medications.  She is needing a refill today.  VS please advise. thanks

## 2014-02-11 ENCOUNTER — Telehealth: Payer: Self-pay | Admitting: Pulmonary Disease

## 2014-02-11 NOTE — Telephone Encounter (Signed)
Pt decided to keep appt as is for right now.  Kathryn Hendricks

## 2014-02-16 ENCOUNTER — Encounter: Payer: Self-pay | Admitting: Pulmonary Disease

## 2014-02-16 ENCOUNTER — Ambulatory Visit (INDEPENDENT_AMBULATORY_CARE_PROVIDER_SITE_OTHER): Payer: 59 | Admitting: Pulmonary Disease

## 2014-02-16 VITALS — BP 122/84 | HR 63 | Ht 64.0 in | Wt 153.0 lb

## 2014-02-16 DIAGNOSIS — G47419 Narcolepsy without cataplexy: Secondary | ICD-10-CM

## 2014-02-16 NOTE — Assessment & Plan Note (Signed)
She has done well with provigil.  She has not noticed any side effects.  Explained that she will always have some degree of residual daytime sleepiness, and purpose of medicine is to allow her to maintain daytime function.  Again discussed importance of scheduled daytime naps if she is able.  Discussed safe driving habits.

## 2014-02-16 NOTE — Patient Instructions (Signed)
Follow up in 6 months 

## 2014-02-16 NOTE — Progress Notes (Signed)
Chief Complaint  Patient presents with  . Follow-up    Pt reports waking up 2 x throughout the night and waking up not feeling well rested.  Also, complians of feeling sleepy throughout the day    History of Present Illness: Kathryn Hendricks is a 58 y.o. female narcolepsy w/o cataplexy.  She has been doing better with provigil.  She takes 100 mg at 730 am and then another 100 mg at 1 pm.  This helps her keep up with work.  She is not able to take naps at work.  She comes home at 5 pm, and then naps for 10 to 20 minutes.  She then goes to bed at 7 pm, and wakes up at 5 am.  She sleeps through the night.  She does okay when she is busy, but gets sleepy when she sits quiet.  This also happens when she is at church.  She does have trouble with her memory sometimes, but writes notes as reminders.  Tests: PSG 06/09/13 >> AHI 3.8, SpO2 low 92% Echo 06/16/13 >> EF 60 to 52%, grade 1 diastolic dysfx, mild/mod LA dilation MSLT 08/04/13 >> mean sleep latency 0.6 min, SOREM in 2/5 naps  Kathryn Hendricks  has a past medical history of Hypertension; Hyperlipidemia; Abnormal Pap smear (2011); and Heart failure.  [name]  has past surgical history that includes Appendectomy; Tubal ligation; Hysteroscopy w/ endometrial ablation (2010); Dilation and curettage of uterus (2010); Wisdom tooth extraction; and Colonoscopy.  Prior to Admission medications   Medication Sig Start Date End Date Taking? Authorizing Provider  aspirin 81 MG EC tablet Take 81 mg by mouth daily.    Yes Historical Provider, MD  diclofenac sodium (VOLTAREN) 1 % GEL Apply 1 application topically 4 (four) times daily as needed. 1 gram to affected area for pain   Yes Historical Provider, MD  lisinopril (PRINIVIL,ZESTRIL) 2.5 MG tablet TAKE 1 TABLET BY MOUTH DAILY   Yes Melony Overly, MD  loratadine (CLARITIN) 10 MG tablet Take 10 mg by mouth daily.     Yes Historical Provider, MD  omeprazole (PRILOSEC) 40 MG capsule Take 1 capsule (40 mg  total) by mouth daily. 05/07/13  Yes Melony Overly, MD  potassium chloride (K-DUR) 10 MEQ tablet Take 1 tablet (10 mEq total) by mouth daily. 08/17/13  Yes Melony Overly, MD  simvastatin (ZOCOR) 40 MG tablet TAKE 1 TABLET BY MOUTH DAILY. 10/02/13  Yes Melony Overly, MD    Allergies  Allergen Reactions  . Penicillins Shortness Of Breath, Itching and Swelling   Physical Exam:  General - No distress ENT - No sinus tenderness, no oral exudate, no LAN, no thyromegaly, TM clear, pupils equal/reactive, scalloped tongue Cardiac - s1s2 regular, no murmur Chest - No wheeze/rales/dullness Back - No focal tenderness Abd - Soft, non-tender Ext - No edema Neuro - Normal strength Skin - No rashes Psych - Normal mood, and behavior  Assessment/plan:  Chesley Mires, M.D. Pager [phone]

## 2014-02-25 ENCOUNTER — Ambulatory Visit (INDEPENDENT_AMBULATORY_CARE_PROVIDER_SITE_OTHER): Payer: 59 | Admitting: Family Medicine

## 2014-02-25 ENCOUNTER — Encounter: Payer: Self-pay | Admitting: Family Medicine

## 2014-02-25 VITALS — BP 124/82 | HR 69 | Temp 98.6°F | Wt 155.0 lb

## 2014-02-25 DIAGNOSIS — R0600 Dyspnea, unspecified: Secondary | ICD-10-CM

## 2014-02-25 DIAGNOSIS — R0989 Other specified symptoms and signs involving the circulatory and respiratory systems: Secondary | ICD-10-CM

## 2014-02-25 DIAGNOSIS — G47419 Narcolepsy without cataplexy: Secondary | ICD-10-CM

## 2014-02-25 DIAGNOSIS — R0609 Other forms of dyspnea: Secondary | ICD-10-CM

## 2014-02-25 NOTE — Progress Notes (Signed)
Patient ID: Kathryn Hendricks, female   DOB: 1956/07/29, 58 y.o.   MRN: [id number]  HPI:  Patient presents to followup on her test results. She was previously seen by her prior PCP for frequent and persistent fatigue. She subsequently had a sleep study which was diagnostic for narcolepsy without cataplexy. She is taking Provigil, which is managed by Dr. Halford Chessman. She still gets fatigued during the day. She also had a Holter monitor done by cardiology for palpitations, this was negative for any significant arrhythmias. She tells me she had been referred there because she had been getting short of breath. It appears as though cardiology offered her a Myoview, as she did not reach her target heart rate during an exercise stress test. She declined having a Myoview. She does not have chest pain. She still is mildly short of breath on occasion if she exerts herself, like when she runs or walks up many stairs. This does not really bother her, and she does not want to pursue further workup at this time.  She has a question about receiving a colonoscopy. Her last colonoscopy was in 2011. She was told to return in 5 years. Her sister was diagnosed with colon cancer last year the age of 28. Her GI doctor for her colonoscopy is Dr. Collene Mares.  ROS: See HPI  [place]: history of epilepsy without cataplexy, and chronic diastolic heart failure, hypertension, mild mental retardation, HLD  PHYSICAL EXAM: BP 124/82  Pulse 69  Temp(Src) 98.6 F (37 C) (Oral)  Wt 155 lb (70.308 kg) Gen: No acute distress, pleasant, cooperative HEENT: Normocephalic atraumatic Heart: Regular rate and rhythm, no murmurs Lungs: Clear to auscultation bilaterally, normal respiratory effort Neuro: Grossly nonfocal, speech normal  ASSESSMENT/PLAN:  58 year old female with narcolepsy which has been managed by sleep medicine. She also has mild intermittent shortness of breath, which is not concerning to her. She underwent a cardiac workup which has  thus far been unremarkable, although she did not reach her target heart rate on her exercise stress test and subsequently refused a Myoview. At this time she is stable, without any significant new symptoms. I will plan to see her back in 3 months to manage her chronic medical problems, or sooner if she has any new issues.  # Health maintenance:  -due for colonoscopy in January 2016. Will place referral so that she can be set up for this colonoscopy, especially as she is concerned given her sister's diagnosis last year with colon cancer.  FOLLOW UP: F/u in 3 months for chronic medical problems  [place] J. Ardelia Mems, [place]

## 2014-02-25 NOTE — Patient Instructions (Signed)
It was nice to meet you today!  I will refer you to GI to get set up for your next colonoscopy, which is due in January 2016.  Follow up with me in 3 months for your chronic medical problems, or sooner if you have any new problems.  Be well, Dr. Ardelia Mems

## 2014-03-10 NOTE — Telephone Encounter (Signed)
Encounter complete. 

## 2014-05-10 ENCOUNTER — Telehealth: Payer: Self-pay | Admitting: Family Medicine

## 2014-05-10 NOTE — Telephone Encounter (Signed)
Pt needs refill on her lisinopril She ran out last week,  She took her last pill last week

## 2014-05-11 MED ORDER — LISINOPRIL 2.5 MG PO TABS: ORAL_TABLET | ORAL | Status: DC

## 2014-05-11 NOTE — Telephone Encounter (Signed)
Rx sent in Shloimy Michalski J Desteni Piscopo, MD  

## 2014-05-25 ENCOUNTER — Other Ambulatory Visit: Payer: Self-pay | Admitting: *Deleted

## 2014-05-26 MED ORDER — POTASSIUM CHLORIDE ER 10 MEQ PO TBCR: 10.0000 meq | EXTENDED_RELEASE_TABLET | Freq: Every day | ORAL | Status: DC

## 2014-06-22 ENCOUNTER — Ambulatory Visit (INDEPENDENT_AMBULATORY_CARE_PROVIDER_SITE_OTHER): Payer: 59 | Admitting: Family Medicine

## 2014-06-22 ENCOUNTER — Encounter: Payer: Self-pay | Admitting: Family Medicine

## 2014-06-22 VITALS — BP 138/76 | HR 55 | Ht 64.0 in | Wt 150.0 lb

## 2014-06-22 DIAGNOSIS — Z1211 Encounter for screening for malignant neoplasm of colon: Secondary | ICD-10-CM

## 2014-06-22 DIAGNOSIS — E7800 Pure hypercholesterolemia, unspecified: Secondary | ICD-10-CM

## 2014-06-22 DIAGNOSIS — Z0000 Encounter for general adult medical examination without abnormal findings: Secondary | ICD-10-CM | POA: Insufficient documentation

## 2014-06-22 DIAGNOSIS — G47419 Narcolepsy without cataplexy: Secondary | ICD-10-CM

## 2014-06-22 DIAGNOSIS — I10 Essential (primary) hypertension: Secondary | ICD-10-CM

## 2014-06-22 DIAGNOSIS — E876 Hypokalemia: Secondary | ICD-10-CM | POA: Insufficient documentation

## 2014-06-22 LAB — BASIC METABOLIC PANEL
BUN: 14 mg/dL (ref 6–23)
CALCIUM: 9.1 mg/dL (ref 8.4–10.5)
CO2: 27 meq/L (ref 19–32)
CREATININE: 0.65 mg/dL (ref 0.50–1.10)
Chloride: 104 mEq/L (ref 96–112)
GLUCOSE: 81 mg/dL (ref 70–99)
Potassium: 4.4 mEq/L (ref 3.5–5.3)
SODIUM: 140 meq/L (ref 135–145)

## 2014-06-22 MED ORDER — SIMVASTATIN 40 MG PO TABS: 40.0000 mg | ORAL_TABLET | Freq: Every day | ORAL | Status: DC

## 2014-06-22 MED ORDER — LISINOPRIL 2.5 MG PO TABS: ORAL_TABLET | ORAL | Status: DC

## 2014-06-22 MED ORDER — ASPIRIN 81 MG PO TBEC: 81.0000 mg | DELAYED_RELEASE_TABLET | Freq: Every day | ORAL | Status: DC

## 2014-06-22 MED ORDER — POTASSIUM CHLORIDE ER 10 MEQ PO TBCR: 10.0000 meq | EXTENDED_RELEASE_TABLET | Freq: Every day | ORAL | Status: DC

## 2014-06-22 MED ORDER — OMEPRAZOLE 40 MG PO CPDR
40.0000 mg | DELAYED_RELEASE_CAPSULE | Freq: Every day | ORAL | Status: DC
Start: 2014-06-22 — End: 2017-11-14

## 2014-06-22 NOTE — Patient Instructions (Signed)
It was nice seeing you today, I have refilled most of your medication, we will get blood test today to monitor your potassium level, I will call you with test result. I will like to see you back in 3 months, but sooner if having any problem.

## 2014-06-22 NOTE — Assessment & Plan Note (Signed)
I refilled her Kdur. BMEt checked today. I will adjust her Kdur dose based on result.

## 2014-06-22 NOTE — Assessment & Plan Note (Signed)
Recommend calling Dr Juanetta Gosling office to schedule earlier appointment. She agreed to do so.

## 2014-06-22 NOTE — Assessment & Plan Note (Signed)
Doing well on Lisinopril 2.5 mg. BMET checked today. Continue home BP monitoring. I refilled her medication today.

## 2014-06-22 NOTE — Assessment & Plan Note (Signed)
Recent FLP done reviewed. Elevated LDL otherwise normal. Continue Zocor 40 mg qd which I refilled today.

## 2014-06-22 NOTE — Assessment & Plan Note (Signed)
She already had flu shot. Mammogram done earlier this year was normal. She is due for colonoscopy in jan 2016, I referred her to Dr Collene Mares today. She is up to date with her PAP as well.

## 2014-06-22 NOTE — Progress Notes (Signed)
Subjective:     Patient ID: Kathryn Hendricks, female   DOB: 1956-07-16, 58 y.o.   MRN: [id number]  HPI HTN: Compliant with Lisinopril 2.5 mg qd, denies any concern, here for follow up. HLD:She is currently compliant with Zocor 40 mg qd. Sleep disorder: She stated she has been on Provigil for excessive daytime sleepiness which is no more as effective as before, she will fall asleep uncontrollably during the day. She mentioned she works long hours at times without break and will get so fatigued after work. She noticed more frequent napping with the use of her medication makes her feel better. She normally sees Dr Halford Chessman for this issue and she is to schedule follow up next few weeks. Hypokalemia: Taking supplement, here for follow up. HM: Follow up with colonoscopy and immunization.  Current Outpatient Prescriptions on File Prior to Visit  Medication Sig Dispense Refill  . diclofenac sodium (VOLTAREN) 1 % GEL Apply 1 application topically 4 (four) times daily as needed. 1 gram to affected area for pain      . loratadine (CLARITIN) 10 MG tablet Take 10 mg by mouth daily.        . modafinil (PROVIGIL) 100 MG tablet Take 1 tablet (100 mg total) by mouth 2 (two) times daily.  60 tablet  3   No current facility-administered medications on file prior to visit.   Past Medical History  Diagnosis Date  . Hypertension   . Hyperlipidemia   . Abnormal Pap smear 2011  . Heart failure      Review of Systems  Respiratory: Negative.   Cardiovascular: Negative.   Gastrointestinal: Negative.   Genitourinary: Negative.   Psychiatric/Behavioral: Positive for sleep disturbance.  All other systems reviewed and are negative.  Filed Vitals:   06/22/14 1107  BP: 138/76  Pulse: 55  Height: 5\' 4"  (1.626 m)  Weight: 150 lb (68.04 kg)       Objective:   Physical Exam  Nursing note and vitals reviewed. Constitutional: She is oriented to person, place, and time. She appears well-developed. No distress.   Cardiovascular: Normal rate, regular rhythm and normal heart sounds.   No murmur heard. Pulmonary/Chest: Effort normal and breath sounds normal. No respiratory distress. She has no wheezes.  Abdominal: Soft. She exhibits no mass. There is no tenderness.  Musculoskeletal: Normal range of motion. She exhibits no edema.  Neurological: She is alert and oriented to person, place, and time. No cranial nerve deficit.       Assessment:     HTN HLD Narcolepsy Hypokalemia Health maintenance     Plan:     Check problem list.

## 2014-06-23 ENCOUNTER — Encounter: Payer: Self-pay | Admitting: *Deleted

## 2014-06-28 ENCOUNTER — Encounter: Payer: Self-pay | Admitting: Family Medicine

## 2014-07-06 ENCOUNTER — Telehealth: Payer: Self-pay | Admitting: *Deleted

## 2014-07-06 NOTE — Telephone Encounter (Signed)
-----   Message from Derl Barrow, RN sent at 07/06/2014 12:06 PM EST ----- Regarding: Pt's call Pt left voice message on nurse line but didn't say what she needed.  Can you please give her a call for me at [phone].  Thank you   .Derl Barrow, RN

## 2014-07-06 NOTE — Telephone Encounter (Signed)
FYI: Pt called to inform us of her SOB episode last night.  States that she was talking with her sister regarding her sister's health and the loss of their mother when it started.  She was having a hard time catching her breath.  Sister informed her she was red in the face and so were her eyes.  She was given a bag to breathe into and this did help her.  States that this happens when she is going through something stressful and thinking of something stressful.  Appt made for her to come in and see Dr. Kennon Rounds on 07/15/2014 which is patient's next day off.  She does see cardiology and is not due to follow up with them til next year. Jazmin Hartsell,CMA

## 2014-07-15 ENCOUNTER — Encounter: Payer: Self-pay | Admitting: Family Medicine

## 2014-07-15 ENCOUNTER — Ambulatory Visit (INDEPENDENT_AMBULATORY_CARE_PROVIDER_SITE_OTHER): Payer: 59 | Admitting: Family Medicine

## 2014-07-15 VITALS — BP 132/62 | HR 64 | Temp 98.5°F | Resp 18 | Wt 150.0 lb

## 2014-07-15 DIAGNOSIS — F430 Acute stress reaction: Principal | ICD-10-CM

## 2014-07-15 DIAGNOSIS — F410 Panic disorder [episodic paroxysmal anxiety] without agoraphobia: Secondary | ICD-10-CM

## 2014-07-15 NOTE — Progress Notes (Signed)
    Subjective:    Patient ID: Kathryn Hendricks is a 58 y.o. female presenting with Anxiety  on 07/15/2014  HPI: Having a conversation with sister, who has stage IV colon cancer about her diagnosis and how sick she is with all the chemo she has been on..  She noted chest pain and an inability to breathe.  She breathed into a paper bag and symptoms resolved.  Notes some DOE and palpitations with exercise. Has seen cards in the past with a non-diagnostic exercise stress test. Having some financial issues, and has a daughter in college.  Review of Systems  Constitutional: Negative for fever and chills.  Respiratory: Positive for shortness of breath.   Cardiovascular: Positive for chest pain.  Gastrointestinal: Negative for nausea, vomiting and abdominal pain.  Genitourinary: Negative for dysuria.  Skin: Negative for rash.      Objective:    BP 132/62 mmHg  Pulse 64  Temp(Src) 98.5 F (36.9 C) (Oral)  Resp 18  Wt 150 lb (68.04 kg)  SpO2 100% Physical Exam  Constitutional: She is oriented to person, place, and time. She appears well-developed and well-nourished. No distress.  HENT:  Head: Normocephalic and atraumatic.  Eyes: No scleral icterus.  Neck: Neck supple.  Cardiovascular: Normal rate.   Pulmonary/Chest: Effort normal.  Abdominal: Soft.  Neurological: She is alert and oriented to person, place, and time.  Skin: Skin is warm and dry.  Psychiatric: She has a normal mood and affect.        Assessment & Plan:  Panic attack as reaction to stress Seems to be related to dying sister and her battle against cancer. She even got emotional and became distraught and overwhelmed with similar symptoms in discussing her sister's case with me. Spent a lot of time with the patient and consoled her.  She is not interested in medication at this time.   Patient was to have stress test with cards--have recommended she f/u with this, since she has a strong f/h of heart disease, though I  strongly doubt that is what is going on.   Return in about 4 weeks (around 08/12/2014) for a follow-up.

## 2014-07-15 NOTE — Patient Instructions (Signed)

## 2014-08-03 ENCOUNTER — Telehealth: Payer: Self-pay | Admitting: Pulmonary Disease

## 2014-08-03 ENCOUNTER — Other Ambulatory Visit: Payer: Self-pay | Admitting: Pulmonary Disease

## 2014-08-03 MED ORDER — MODAFINIL 100 MG PO TABS: 100.0000 mg | ORAL_TABLET | Freq: Two times a day (BID) | ORAL | Status: DC

## 2014-08-03 NOTE — Telephone Encounter (Signed)
Pt calling needing refill of Provigil 100mg  Pt up to date on visits and has a recall in the computer to schedule OV for Jan.  Provigil refill sent [place].  Nothing further needed.

## 2014-09-13 ENCOUNTER — Telehealth: Payer: Self-pay | Admitting: Pulmonary Disease

## 2014-09-13 NOTE — Telephone Encounter (Signed)
ATC pt. Unable to leave message d/t VM box being full. WCB.

## 2014-09-14 NOTE — Telephone Encounter (Signed)
Pt returned call [phone]

## 2014-09-14 NOTE — Telephone Encounter (Signed)
ATC, NA unable to leave msg

## 2014-09-15 NOTE — Telephone Encounter (Signed)
[phone] calling back

## 2014-09-15 NOTE — Telephone Encounter (Signed)
Called and spoke to pt. Pt is requesting an appt. Appt made with VS on 10/25/14. Pt verbalized understanding and denied any further questions or concerns at this time.

## 2014-09-21 ENCOUNTER — Encounter (HOSPITAL_COMMUNITY): Payer: Self-pay | Admitting: *Deleted

## 2014-09-21 ENCOUNTER — Emergency Department (INDEPENDENT_AMBULATORY_CARE_PROVIDER_SITE_OTHER): Payer: 59

## 2014-09-21 ENCOUNTER — Emergency Department (INDEPENDENT_AMBULATORY_CARE_PROVIDER_SITE_OTHER)
Admission: EM | Admit: 2014-09-21 | Discharge: 2014-09-21 | Disposition: A | Discharge status: Home / Self Care | Payer: 59 | Source: Home / Self Care | Attending: Emergency Medicine | Admitting: Emergency Medicine

## 2014-09-21 DIAGNOSIS — J209 Acute bronchitis, unspecified: Secondary | ICD-10-CM

## 2014-09-21 DIAGNOSIS — J019 Acute sinusitis, unspecified: Secondary | ICD-10-CM

## 2014-09-21 MED ORDER — IPRATROPIUM BROMIDE 0.06 % NA SOLN: 2.0000 | Freq: Four times a day (QID) | NASAL | Status: DC

## 2014-09-21 MED ORDER — ALBUTEROL SULFATE HFA 108 (90 BASE) MCG/ACT IN AERS: 2.0000 | INHALATION_SPRAY | Freq: Four times a day (QID) | RESPIRATORY_TRACT | Status: DC

## 2014-09-21 MED ORDER — HYDROCOD POLST-CHLORPHEN POLST 10-8 MG/5ML PO LQCR: 5.0000 mL | Freq: Two times a day (BID) | ORAL | Status: DC | PRN

## 2014-09-21 MED ORDER — PREDNISONE 20 MG PO TABS: 20.0000 mg | ORAL_TABLET | Freq: Two times a day (BID) | ORAL | Status: DC

## 2014-09-21 MED ORDER — DOXYCYCLINE HYCLATE 100 MG PO TABS
100.0000 mg | ORAL_TABLET | Freq: Two times a day (BID) | ORAL | Status: DC
Start: 2014-09-21 — End: 2014-10-25

## 2014-09-21 NOTE — Discharge Instructions (Signed)
Acute Bronchitis Bronchitis is inflammation of the airways that extend from the windpipe into the lungs (bronchi). The inflammation often causes mucus to develop. This leads to a cough, which is the most common symptom of bronchitis.  In acute bronchitis, the condition usually develops suddenly and goes away over time, usually in a couple weeks. Smoking, allergies, and asthma can make bronchitis worse. Repeated episodes of bronchitis may cause further lung problems.  CAUSES Acute bronchitis is most often caused by the same virus that causes a cold. The virus can spread from person to person (contagious) through coughing, sneezing, and touching contaminated objects. SIGNS AND SYMPTOMS  1. Cough.  2. Fever.  3. Coughing up mucus.  4. Body aches.  5. Chest congestion.  6. Chills.  7. Shortness of breath.  8. Sore throat.  DIAGNOSIS  Acute bronchitis is usually diagnosed through a physical exam. Your health care provider will also ask you questions about your medical history. Tests, such as chest X-rays, are sometimes done to rule out other conditions.  TREATMENT  Acute bronchitis usually goes away in a couple weeks. Oftentimes, no medical treatment is necessary. Medicines are sometimes given for relief of fever or cough. Antibiotic medicines are usually not needed but may be prescribed in certain situations. In some cases, an inhaler may be recommended to help reduce shortness of breath and control the cough. A cool mist vaporizer may also be used to help thin bronchial secretions and make it easier to clear the chest.  HOME CARE INSTRUCTIONS 1. Get plenty of rest.  2. Drink enough fluids to keep your urine clear or pale yellow (unless you have a medical condition that requires fluid restriction). Increasing fluids may help thin your respiratory secretions (sputum) and reduce chest congestion, and it will prevent dehydration.  3. Take medicines only as directed by your health care  provider. 4. If you were prescribed an antibiotic medicine, finish it all even if you start to feel better. 5. Avoid smoking and secondhand smoke. Exposure to cigarette smoke or irritating chemicals will make bronchitis worse. If you are a smoker, consider using nicotine gum or skin patches to help control withdrawal symptoms. Quitting smoking will help your lungs heal faster.  6. Reduce the chances of another bout of acute bronchitis by washing your hands frequently, avoiding people with cold symptoms, and trying not to touch your hands to your mouth, nose, or eyes.  7. Keep all follow-up visits as directed by your health care provider.  SEEK MEDICAL CARE IF: Your symptoms do not improve after 1 week of treatment.  SEEK IMMEDIATE MEDICAL CARE IF:  You develop an increased fever or chills.   You have chest pain.   You have severe shortness of breath.  You have bloody sputum.   You develop dehydration.  You faint or repeatedly feel like you are going to pass out.  You develop repeated vomiting.  You develop a severe headache. MAKE SURE YOU:   Understand these instructions.  Will watch your condition.  Will get help right away if you are not doing well or get worse. Document Released: 09/20/2004 Document Revised: 12/28/2013 Document Reviewed: 02/03/2013 [facility] Patient Information 2015 [place], [place]. This information is not intended to replace advice given to you by your health care provider. Make sure you discuss any questions you have with your health care provider.  Sinusitis Sinusitis is redness, soreness, and inflammation of the paranasal sinuses. Paranasal sinuses are air pockets within the bones of your face (beneath  the eyes, the middle of the forehead, or above the eyes). In healthy paranasal sinuses, mucus is able to drain out, and air is able to circulate through them by way of your nose. However, when your paranasal sinuses are inflamed, mucus and air can become  trapped. This can allow bacteria and other germs to grow and cause infection. Sinusitis can develop quickly and last only a short time (acute) or continue over a long period (chronic). Sinusitis that lasts for more than 12 weeks is considered chronic.  CAUSES  Causes of sinusitis include: 9. Allergies. 10. Structural abnormalities, such as displacement of the cartilage that separates your nostrils (deviated septum), which can decrease the air flow through your nose and sinuses and affect sinus drainage. 11. Functional abnormalities, such as when the small hairs (cilia) that line your sinuses and help remove mucus do not work properly or are not present. SIGNS AND SYMPTOMS  Symptoms of acute and chronic sinusitis are the same. The primary symptoms are pain and pressure around the affected sinuses. Other symptoms include: 8. Upper toothache. 9. Earache. 10. Headache. 11. Bad breath. 12. Decreased sense of smell and taste. 13. A cough, which worsens when you are lying flat. 14. Fatigue. 15. Fever. 16. Thick drainage from your nose, which often is green and may contain pus (purulent). 17. Swelling and warmth over the affected sinuses. DIAGNOSIS  Your health care provider will perform a physical exam. During the exam, your health care provider may:  Look in your nose for signs of abnormal growths in your nostrils (nasal polyps).  Tap over the affected sinus to check for signs of infection.  View the inside of your sinuses (endoscopy) using an imaging device that has a light attached (endoscope). If your health care provider suspects that you have chronic sinusitis, one or more of the following tests may be recommended:  Allergy tests.  Nasal culture. A sample of mucus is taken from your nose, sent to a lab, and screened for bacteria.  Nasal cytology. A sample of mucus is taken from your nose and examined by your health care provider to determine if your sinusitis is related to an  allergy. TREATMENT  Most cases of acute sinusitis are related to a viral infection and will resolve on their own within 10 days. Sometimes medicines are prescribed to help relieve symptoms (pain medicine, decongestants, nasal steroid sprays, or saline sprays).  However, for sinusitis related to a bacterial infection, your health care provider will prescribe antibiotic medicines. These are medicines that will help kill the bacteria causing the infection.  Rarely, sinusitis is caused by a fungal infection. In theses cases, your health care provider will prescribe antifungal medicine. For some cases of chronic sinusitis, surgery is needed. Generally, these are cases in which sinusitis recurs more than 3 times per year, despite other treatments. HOME CARE INSTRUCTIONS   Drink plenty of water. Water helps thin the mucus so your sinuses can drain more easily.  Use a humidifier.  Inhale steam 3 to 4 times a day (for example, sit in the bathroom with the shower running).  Apply a warm, moist washcloth to your face 3 to 4 times a day, or as directed by your health care provider.  Use saline nasal sprays to help moisten and clean your sinuses.  Take medicines only as directed by your health care provider.  If you were prescribed either an antibiotic or antifungal medicine, finish it all even if you start to feel better. SEEK IMMEDIATE  MEDICAL CARE IF:  You have increasing pain or severe headaches.  You have nausea, vomiting, or drowsiness.  You have swelling around your face.  You have vision problems.  You have a stiff neck.  You have difficulty breathing. MAKE SURE YOU:   Understand these instructions.  Will watch your condition.  Will get help right away if you are not doing well or get worse. Document Released: 08/13/2005 Document Revised: 12/28/2013 Document Reviewed: 08/28/2011 [facility] Patient Information 2015 [place], [place]. This information is not intended to replace advice  given to you by your health care provider. Make sure you discuss any questions you have with your health care provider. How to Use an Inhaler Proper inhaler technique is very important. Good technique ensures that the medicine reaches the lungs. Poor technique results in depositing the medicine on the tongue and back of the throat rather than in the airways. If you do not use the inhaler with good technique, the medicine will not help you. STEPS TO FOLLOW IF USING AN INHALER WITHOUT AN EXTENSION TUBE 12. Remove the cap from the inhaler. 13. If you are using the inhaler for the first time, you will need to prime it. Shake the inhaler for 5 seconds and release four puffs into the air, away from your face. Ask your health care provider or pharmacist if you have questions about priming your inhaler. 14. Shake the inhaler for 5 seconds before each breath in (inhalation). 15. Position the inhaler so that the top of the canister faces up. 16. Put your index finger on the top of the medicine canister. Your thumb supports the bottom of the inhaler. 17. Open your mouth. 18. Either place the inhaler between your teeth and place your lips tightly around the mouthpiece, or hold the inhaler 1-2 inches away from your open mouth. If you are unsure of which technique to use, ask your health care provider. 19. Breathe out (exhale) normally and as completely as possible. 20. Press the canister down with your index finger to release the medicine. 21. At the same time as the canister is pressed, inhale deeply and slowly until your lungs are completely filled. This should take 4-6 seconds. Keep your tongue down. 22. Hold the medicine in your lungs for 5-10 seconds (10 seconds is best). This helps the medicine get into the small airways of your lungs. 23. Breathe out slowly, through pursed lips. Whistling is an example of pursed lips. 24. Wait at least 15-30 seconds between puffs. Continue with the above steps until you  have taken the number of puffs your health care provider has ordered. Do not use the inhaler more than your health care provider tells you. 25. Replace the cap on the inhaler. 26. Follow the directions from your health care provider or the inhaler insert for cleaning the inhaler. STEPS TO FOLLOW IF USING AN INHALER WITH AN EXTENSION (SPACER) 18. Remove the cap from the inhaler. 19. If you are using the inhaler for the first time, you will need to prime it. Shake the inhaler for 5 seconds and release four puffs into the air, away from your face. Ask your health care provider or pharmacist if you have questions about priming your inhaler. 20. Shake the inhaler for 5 seconds before each breath in (inhalation). 21. Place the open end of the spacer onto the mouthpiece of the inhaler. 22. Position the inhaler so that the top of the canister faces up and the spacer mouthpiece faces you. 23. Put your index  finger on the top of the medicine canister. Your thumb supports the bottom of the inhaler and the spacer. 24. Breathe out (exhale) normally and as completely as possible. 25. Immediately after exhaling, place the spacer between your teeth and into your mouth. Close your lips tightly around the spacer. 26. Press the canister down with your index finger to release the medicine. 27. At the same time as the canister is pressed, inhale deeply and slowly until your lungs are completely filled. This should take 4-6 seconds. Keep your tongue down and out of the way. 28. Hold the medicine in your lungs for 5-10 seconds (10 seconds is best). This helps the medicine get into the small airways of your lungs. Exhale. 29. Repeat inhaling deeply through the spacer mouthpiece. Again hold that breath for up to 10 seconds (10 seconds is best). Exhale slowly. If it is difficult to take this second deep breath through the spacer, breathe normally several times through the spacer. Remove the spacer from your mouth. 30. Wait at  least 15-30 seconds between puffs. Continue with the above steps until you have taken the number of puffs your health care provider has ordered. Do not use the inhaler more than your health care provider tells you. 31. Remove the spacer from the inhaler, and place the cap on the inhaler. 32. Follow the directions from your health care provider or the inhaler insert for cleaning the inhaler and spacer. If you are using different kinds of inhalers, use your quick relief medicine to open the airways 10-15 minutes before using a steroid if instructed to do so by your health care provider. If you are unsure which inhalers to use and the order of using them, ask your health care provider, nurse, or respiratory therapist. If you are using a steroid inhaler, always rinse your mouth with water after your last puff, then gargle and spit out the water. Do not swallow the water. AVOID:  Inhaling before or after starting the spray of medicine. It takes practice to coordinate your breathing with triggering the spray.  Inhaling through the nose (rather than the mouth) when triggering the spray. HOW TO DETERMINE IF YOUR INHALER IS FULL OR NEARLY EMPTY You cannot know when an inhaler is empty by shaking it. A few inhalers are now being made with dose counters. Ask your health care provider for a prescription that has a dose counter if you feel you need that extra help. If your inhaler does not have a counter, ask your health care provider to help you determine the date you need to refill your inhaler. Write the refill date on a calendar or your inhaler canister. Refill your inhaler 7-10 days before it runs out. Be sure to keep an adequate supply of medicine. This includes making sure it is not expired, and that you have a spare inhaler.  SEEK MEDICAL CARE IF:   Your symptoms are only partially relieved with your inhaler.  You are having trouble using your inhaler.  You have some increase in phlegm. SEEK IMMEDIATE  MEDICAL CARE IF:   You feel little or no relief with your inhalers. You are still wheezing and are feeling shortness of breath or tightness in your chest or both.  You have dizziness, headaches, or a fast heart rate.  You have chills, fever, or night sweats.  You have a noticeable increase in phlegm production, or there is blood in the phlegm. MAKE SURE YOU:   Understand these instructions.  Will watch your  condition.  Will get help right away if you are not doing well or get worse. Document Released: 08/10/2000 Document Revised: 06/03/2013 Document Reviewed: 03/12/2013 [facility] Patient Information 2015 [place], [place]. This information is not intended to replace advice given to you by your health care provider. Make sure you discuss any questions you have with your health care provider.

## 2014-09-21 NOTE — ED Provider Notes (Signed)
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 This began with feeling lightheaded, chilled, and fever to 101. Her fevers since would come down. Last day of fever was 5 days ago. She's had a cough productive yellowish, blood-tinged sputum, chest tightness, wheezing, and chest pain. She's had sore throat and has been hoarse. She's had nasal congestion with yellowish, blood-tinged rhinorrhea and ear pain and congestion. She denies any GI symptoms.  Review of Systems   Other than as noted above, the patient denies any of the following symptoms: Systemic:  No fevers, chills, sweats, or myalgias. Eye:  No redness or discharge. ENT:  No ear pain, headache, nasal congestion, drainage, sinus pressure, or sore throat. Neck:  No neck pain, stiffness, or swollen glands. Lungs:  No cough, sputum production, hemoptysis, wheezing, chest tightness, shortness of breath or chest pain. GI:  No abdominal pain, nausea, vomiting or diarrhea.  [place]   Past medical history, family history, social history, meds, and allergies were reviewed. She is allergic to penicillin which makes her feel dizzy. She has mild congestive heart failure, hypertension, narcolepsy current meds include aspirin, diclofenac gel, lisinopril, loratadine, omeprazole, potassium chloride, and simvastatin.  Physical exam   Vital signs:  BP 126/78 mmHg  Pulse 70  Temp(Src) 98.6 F (37 C) (Oral)  Resp 20  SpO2 97% General:  Alert and oriented.  In no distress.  Skin warm and dry. Eye:  No conjunctival injection or drainage. Lids were normal. ENT:  TMs and canals were normal, without erythema or inflammation.  Nasal mucosa was clear and uncongested, without drainage.  Mucous membranes were moist.  Pharynx was clear with no exudate or drainage.  There were no oral ulcerations or lesions. Neck:  Supple,  no adenopathy, tenderness or mass. Lungs:  No respiratory distress.  Lungs were clear to auscultation, without wheezes, rales or rhonchi.  Breath sounds were clear and equal bilaterally.  Heart:  Regular rhythm, without gallops, murmers or rubs. Skin:  Clear, warm, and dry, without rash or lesions.  Radiology   Dg Chest 2 View  09/21/2014   CLINICAL DATA:  Cough for 3 weeks  EXAM: CHEST  2 VIEW  COMPARISON:  01/17/2012  FINDINGS: Cardiomediastinal silhouette is unremarkable. No acute infiltrate or pleural effusion. No pulmonary edema. Bony thorax is stable.  IMPRESSION: No active cardiopulmonary disease.   Electronically Signed   By: Lahoma Crocker M.D.   On: 09/21/2014 19:15   Assessment     The primary encounter diagnosis was Acute bronchitis, unspecified organism. A diagnosis of Acute sinusitis, recurrence not specified, unspecified location was also pertinent to this visit.  There is no evidence of pneumonia, strep throat, otitis media.    Plan    1.  Meds:  The following meds were prescribed:   Discharge Medication List as of 09/21/2014  7:30 PM    START taking these medications   Details  albuterol (PROVENTIL HFA;VENTOLIN HFA) 108 (90 BASE) MCG/ACT inhaler Inhale 2 puffs into the lungs 4 (four) times daily., Starting 09/21/2014, Until Discontinued, Normal    chlorpheniramine-HYDROcodone (TUSSIONEX) 10-8 MG/5ML LQCR Take 5 mLs by mouth every 12 (twelve) hours as needed for cough., Starting 09/21/2014, Until Discontinued, Normal    doxycycline (VIBRA-TABS) 100 MG tablet Take 1 tablet (100 mg total) by mouth 2 (two) times daily., Starting 09/21/2014, Until Discontinued, Normal  ipratropium (ATROVENT) 0.06 % nasal spray Place 2 sprays into both nostrils 4 (four) times daily., Starting 09/21/2014, Until Discontinued, Normal    predniSONE (DELTASONE) 20 MG tablet Take 1 tablet (20 mg total) by mouth 2 (two) times daily., Starting 09/21/2014, Until Discontinued, Normal        2.  Patient  Education/Counseling:  The patient was given appropriate handouts, self care instructions, and instructed in symptomatic relief.  Instructed to get extra fluids and extra rest.    3.  Follow up:  The patient was told to follow up here if no better in 3 to 4 days, or sooner if becoming worse in any way, and given some red flag symptoms such as increasing fever, difficulty breathing, chest pain, or persistent vomiting which would prompt immediate return.       Harden Mo, MD 09/21/14 [phone]

## 2014-09-21 NOTE — ED Notes (Signed)
Light headed 2 weeks ago, then got a cough, chills, fever and weakness.  She was out of work all last week.  Taking Robitussin, Mucinex, and Tylenol cold without relief.  Cough was clear mucous now its yellow and blood tinged.  C/o sorethroat.

## 2014-09-28 ENCOUNTER — Ambulatory Visit (INDEPENDENT_AMBULATORY_CARE_PROVIDER_SITE_OTHER): Payer: 59 | Admitting: Family Medicine

## 2014-09-28 ENCOUNTER — Telehealth: Payer: Self-pay | Admitting: Pulmonary Disease

## 2014-09-28 ENCOUNTER — Encounter: Payer: Self-pay | Admitting: Family Medicine

## 2014-09-28 VITALS — BP 158/83 | HR 62 | Temp 98.5°F | Ht 64.0 in | Wt 148.4 lb

## 2014-09-28 DIAGNOSIS — Z1389 Encounter for screening for other disorder: Secondary | ICD-10-CM

## 2014-09-28 DIAGNOSIS — G47419 Narcolepsy without cataplexy: Secondary | ICD-10-CM

## 2014-09-28 DIAGNOSIS — IMO0001 Reserved for inherently not codable concepts without codable children: Secondary | ICD-10-CM

## 2014-09-28 DIAGNOSIS — Z0000 Encounter for general adult medical examination without abnormal findings: Secondary | ICD-10-CM

## 2014-09-28 DIAGNOSIS — Z1331 Encounter for screening for depression: Secondary | ICD-10-CM | POA: Insufficient documentation

## 2014-09-28 NOTE — Progress Notes (Signed)
Patient ID: Kathryn Hendricks, female   DOB: [birth year], 59 y.o.   MRN: [id number] Subjective:     Kathryn Hendricks is a 59 y.o. female and is here for a comprehensive physical exam. The patient reports problems - Sleep problem. Patient has Narcolepsy and was out of her provigil for a while, hence was not able to go to work from jan 18th till 22nd due to intense fatigue and sleepiness. Due to this reason she needed a work letter faxed to her Tourist information centre manager.. Patient stated she contacted our office during this time but was not seen.She also contacted her sleep doctor.  Depression Screening completed: PHQ9 score of 9 mostly due to + sleep disorder.  History   Social History  . Marital Status: Divorced    Spouse Name: N/A    Number of Children: 3  . Years of Education: N/A   Occupational History  . [employer] History Main Topics  . Smoking status: Never Smoker   . Smokeless tobacco: Never Used  . Alcohol Use: No  . Drug Use: No  . Sexual Activity: No   Other Topics Concern  . Not on file   Social History Narrative   Health Maintenance  Topic Date Due  . INFLUENZA VACCINE  03/28/2015  . PAP SMEAR  05/22/2015  . MAMMOGRAM  12/03/2015  . TETANUS/TDAP  06/27/2017  . COLONOSCOPY  09/15/2019    The following portions of the patient's history were reviewed and updated as appropriate: allergies, current medications, past family history, past medical history, past social history, past surgical history and problem list.  Review of Systems Pertinent items are noted in HPI.   Objective:    BP 158/83 mmHg  Pulse 62  Temp(Src) 98.5 F (36.9 C) (Oral)  Ht 5\' 4"  (1.626 m)  Wt 148 lb 6 oz (67.302 kg)  BMI 25.46 kg/m2 General appearance: alert Head: Normocephalic, without obvious abnormality, atraumatic Eyes: conjunctivae/corneas clear. PERRL, EOM's intact. Fundi benign. Ears: normal TM's and external ear canals both ears Throat: lips, mucosa, and tongue normal;  teeth and gums normal Neck: no adenopathy, no carotid bruit, no JVD, supple, symmetrical, trachea midline and thyroid not enlarged, symmetric, no tenderness/mass/nodules Lungs: clear to auscultation bilaterally Heart: regular rate and rhythm, S1, S2 normal, no murmur, click, rub or gallop Abdomen: soft, non-tender; bowel sounds normal; no masses,  no organomegaly Pelvic: deferred and she sees Gynecologist for this Extremities: extremities normal, atraumatic, no cyanosis or edema Pulses: 2+ and symmetric Skin: Skin color, texture, turgor normal. No rashes or lesions Neurologic: Alert and oriented X 3, normal strength and tone. Normal symmetric reflexes. Normal coordination and gait    Assessment:    Healthy female exam.      Plan:  Up to date with PAP, mammogram. Plan to schedule colonoscopy once she pays the amount she is owing at the GI office.   See After Visit Summary for Counseling Recommendations

## 2014-09-28 NOTE — Assessment & Plan Note (Addendum)
Patient stated she had not been feeling good with her symptoms. She was out of work from Jan 18th till the 22nd. I don't see any record she saw someone here. I advised she also contact Dr Blondell Reveal since he manages this. I will fax letter to her case manager stating she gave hx of worsening symptomss hence missed work.  Case manager: Junious Dresser at [facility]  Phone number: [phone]  Fax number:     [phone]  Patient gave me this number today. I faxed her letter.

## 2014-09-28 NOTE — Telephone Encounter (Signed)
Spoke with pt. States that she can't take Provigil. It is making her fall asleep. Was out of work 4 days last week due to this. She needs a letter from Dr. Halford Chessman stating that she has a sleep disorder that makes her miss work. States that this needs to be faxed to [phone] . Tried to explain to pt that this was not enough #'s and she continued to tell me this was the correct #.  VS - please advise. Thanks.

## 2014-09-28 NOTE — Assessment & Plan Note (Signed)
Score of 9 on her PHQ 9. (Mild depression) Asymptomatic. Largely positive due to sleep disorder and fatigue. Not suicidal. No medication for depression indicated at this time. Brief counseling especially regarding her sleep issue. Monitor for now. Return precaution discussed.

## 2014-09-28 NOTE — Patient Instructions (Signed)
It was nice seeing you today. Please remember to schedule appointment with your gastroenterologist for colonoscopy this month, You are due for PAP in sept. Schedule mammogram in  April.

## 2014-09-28 NOTE — Telephone Encounter (Signed)
Patient stated she had not been feeling good with her symptoms. She was out of work from Jan 18th till the 22nd. I don't see any record she saw someone here. I advised she also contact Dr Blondell Reveal since he manages this. I will fax letter to her case manager stating she gave hx of worsening symptomss hence missed work.  Case manager: Junious Dresser at [facility]  Phone number: [phone]  Fax number:     [phone]  Patient gave me this number today. I faxed her letter.

## 2014-10-05 ENCOUNTER — Encounter: Payer: Self-pay | Admitting: Pulmonary Disease

## 2014-10-05 NOTE — Telephone Encounter (Signed)
Spoke with patient, aware that letter will be faxed to [facility] with Zacarias Pontes [phone].  Pt has appt with VS 2/29.  Nothing further needed. Kathryn Hendricks

## 2014-10-05 NOTE — Telephone Encounter (Signed)
I have typed letter in [place].  She also needs to schedule ROV.

## 2014-10-07 ENCOUNTER — Telehealth: Payer: Self-pay | Admitting: Pulmonary Disease

## 2014-10-07 NOTE — Telephone Encounter (Signed)
I called spoke with Kathryn Hendricks. She reports Shella over at [facility] needs dates when Kathryn Hendricks started treatment here in the office placed in a letter. Fax and phone # is in previous phone note.  Also Kathryn Hendricks is wanting to know if we received her FMLA paperwork? Please advise VS thanks

## 2014-10-14 ENCOUNTER — Encounter: Payer: Self-pay | Admitting: Pulmonary Disease

## 2014-10-14 NOTE — Telephone Encounter (Signed)
Letter printed >> in my office.  I have not received any FMLA forms.

## 2014-10-14 NOTE — Telephone Encounter (Signed)
Have we received pt's FMLA paperwork? Thanks.

## 2014-10-15 NOTE — Telephone Encounter (Signed)
Called and spoke to pt. Informed pt of the letter and the FMLA paper work status. Letter faxed to [phone]. Pt verbalized understanding and stated she would call healthport to get an ipdate on her FMLA papers.

## 2014-10-19 NOTE — Telephone Encounter (Addendum)
Forms received from [employer], Dr Halford Chessman completed. Spoke with pt, aware that FMLA forms are completed and will need her information written in and her signature. Pt states that she is going to come in and sign these on Thursday 10/21/14. Pt aware that once completed we will send these down to Medical Records to be processed and faxed back to [employer].   Pt is requesting a change in "Frequency/Duration" section. Pt is wanting this changed to " 1 episode per 1 month, 3-4 days per episode " Pt states that she was told by her job that she would not be able to take nap breaks when needed through the day. Pt states that she has been working through lunch a lot of days and is "run down." Pt states that when she gets like this she has to take 1-2 days off to recuperate. Pt states that these episodes are becoming for frequent and she is finding that she is needing more days per episode. Pt advised that I would discuss this with Dr Halford Chessman in office 10/20/14.

## 2014-10-19 NOTE — Telephone Encounter (Signed)
Ashtyn and Dr. Halford Chessman please advise on status of this fmla.  Thank you!

## 2014-10-19 NOTE — Telephone Encounter (Signed)
Form completed.

## 2014-10-19 NOTE — Telephone Encounter (Signed)
Per previous telephone note, 09/28/14, letter has been typed and faxed to claims manager Junious Dresser at [facility]       Case manager: Junious Dresser at [facility]      Phone number: [phone]      Fax number: [phone] FMLA is not being handled here, patient uses [employer] and has a claim open.  I am not sure what is needed as everything has been faxed already to her case manager, fax [phone] Letter was typed by Dr Halford Chessman per patient request stating first visit date, faxed to [place] x1

## 2014-10-20 NOTE — Telephone Encounter (Signed)
Was this discussed with VS? Thanks.

## 2014-10-22 NOTE — Telephone Encounter (Signed)
Patient stopped by to sign FMLA papers 10/21/14 at 5:15. Aware that once these are sent to medical records that they will complete the process and will contact her regarding the [facility] payment. Kathryn Hendricks is going to hand deliver these forms to [facility] in the basement  Spoke with [place] in [facility], made aware that forms are being brought down by [employer].  Sharyn Lull will call if anything else needed.

## 2014-10-22 NOTE — Telephone Encounter (Signed)
Kathryn Hendricks has this been handled?  Thanks!

## 2014-10-25 ENCOUNTER — Ambulatory Visit (INDEPENDENT_AMBULATORY_CARE_PROVIDER_SITE_OTHER): Payer: 59 | Admitting: Pulmonary Disease

## 2014-10-25 ENCOUNTER — Encounter: Payer: Self-pay | Admitting: Pulmonary Disease

## 2014-10-25 VITALS — BP 144/90 | HR 60 | Temp 98.5°F | Ht 64.0 in | Wt 153.0 lb

## 2014-10-25 DIAGNOSIS — R413 Other amnesia: Secondary | ICD-10-CM

## 2014-10-25 DIAGNOSIS — G47419 Narcolepsy without cataplexy: Secondary | ICD-10-CM

## 2014-10-25 NOTE — Patient Instructions (Signed)
Will arrange for referral to Dr. Asencion Partridge Dohmeier with [facility] Follow up in 4 months

## 2014-10-25 NOTE — Progress Notes (Signed)
I agree with the assessment  And will be happy to see the patient.    Seaborn Nakama, MD

## 2014-10-25 NOTE — Progress Notes (Signed)
Chief Complaint  Patient presents with  . Follow-up    Pt states that she stopped Provigil x 1 month d/t it becoming ineffective - Pt reports falling asleep and feeling tired all the time.  Pt reports some issues when driving, feels sleepy. Pt states that she tries not to drive alone.     History of Present Illness: Kathryn Hendricks is a 59 y.o. female narcolepsy w/o cataplexy.  She had to stop her provigil.  This wasn't helping and it made her feel short of breath.  Her breathing got better after she stopped provigil.  She is having a hard time staying awake during the day.  She falls asleep randomly during the day.  She has noticed more trouble with her memory.  She is not able to stayed focused.  She feels clumsy and unsteady walking sometimes.  Tests: PSG 06/09/13 >> AHI 3.8, SpO2 low 92% Echo 06/16/13 >> EF 60 to 01%, grade 1 diastolic dysfx, mild/mod LA dilation MSLT 08/04/13 >> mean sleep latency 0.6 min, SOREM in 2/5 naps  Physical Exam: Blood pressure 144/90, pulse 60, temperature 98.5 F (36.9 C), temperature source Oral, height 5\' 4"  (1.626 m), weight 153 lb (69.4 kg), SpO2 100 %. Body mass index is 26.25 kg/(m^2).  General - No distress ENT - No sinus tenderness, no oral exudate, no LAN, no thyromegaly, TM clear, pupils equal/reactive, scalloped tongue Cardiac - s1s2 regular, no murmur Chest - No wheeze/rales/dullness Back - No focal tenderness Abd - Soft, non-tender Ext - No edema Neuro - CN intact, Pupils reactive, Normal strength, difficulty with finger to nose testing Skin - No rashes Psych - Normal mood, and behavior  Assessment/plan:  Narcolepsy w/o cataplexy.  Now with difficulty with memory and concentration.  She has not been able to tolerate stimulant medications due to side effects of palpitations. Plan: - will arrange for referral to Dr. Asencion Partridge Dohmeier with neurology to further assess her difficulties with memory and coordination, and then also defer  further treatment of her narcolepsy to Dr. Brett Fairy  I have schedule her for follow up with me in several months, but advised she could cancel this appointment if she plans to follow up with Dr. Brett Fairy.   Chesley Mires, M.D. Pager [phone]

## 2014-10-28 ENCOUNTER — Telehealth: Payer: Self-pay | Admitting: Pulmonary Disease

## 2014-10-28 NOTE — Telephone Encounter (Signed)
Spoke with the pt  She states that someone called and left VM but unsure of who  She wonders if we were calling about referral to neurology  Please advise PCCs thanks

## 2014-10-28 NOTE — Telephone Encounter (Signed)
Called pt & advised that it is probably  Dr. Edwena Felty office calling to set up her appt.  Pt said she had been able to retrieve the message from her VM and will call that office on tomorrow morning, 10/29/14.  I gave pt the phone number for that office Kara Mead

## 2014-11-04 ENCOUNTER — Other Ambulatory Visit: Payer: Self-pay

## 2014-11-04 DIAGNOSIS — Z1231 Encounter for screening mammogram for malignant neoplasm of breast: Secondary | ICD-10-CM

## 2014-11-09 ENCOUNTER — Encounter: Payer: Self-pay | Admitting: Neurology

## 2014-11-09 ENCOUNTER — Ambulatory Visit (INDEPENDENT_AMBULATORY_CARE_PROVIDER_SITE_OTHER): Payer: 59 | Admitting: Neurology

## 2014-11-09 VITALS — BP 120/66 | HR 65 | Resp 12 | Ht 64.0 in | Wt 150.4 lb

## 2014-11-09 DIAGNOSIS — F900 Attention-deficit hyperactivity disorder, predominantly inattentive type: Secondary | ICD-10-CM

## 2014-11-09 DIAGNOSIS — G3184 Mild cognitive impairment, so stated: Secondary | ICD-10-CM | POA: Diagnosis not present

## 2014-11-09 DIAGNOSIS — G47419 Narcolepsy without cataplexy: Secondary | ICD-10-CM

## 2014-11-09 DIAGNOSIS — R413 Other amnesia: Secondary | ICD-10-CM | POA: Insufficient documentation

## 2014-11-09 MED ORDER — AMPHETAMINE-DEXTROAMPHET ER 15 MG PO CP24: 15.0000 mg | ORAL_CAPSULE | ORAL | Status: DC

## 2014-11-09 NOTE — Progress Notes (Signed)
SLEEP MED[facility]INE CLINIC   Provider:  Larey Seat, M D  Referring Provider: Chesley Mires, MD Primary Care Physician:  Andrena Mews, MD  Chief Complaint  Patient presents with  . NP Sood Narcolepsy    Rm 10, alone    HPI:  Kathryn Hendricks is a 59 y.o. female , a new patient ,  seen here as a referral  from Dr. Halford Chessman for treatmentn of recently diagnosed Narcolepsy and evaluation of memory concerns.   11-09-14 Kathryn Hendricks reports that she had increasing difficulties with sleepiness and felt increasingly fatigued also she was not aware that she had physically exerted herself. Her primary care physician at the time was [name] and he referred her for a sleep study in 2014 she underwent a PSG on 06-09-13 and had a very normal PSG with a sleep latency of 3 minutes but a REM latency of 67.5 minutes. Her sleep efficiency was 85.3% and her total sleep time 331 minutes. There were no significant apneas or hypopneas noted her AHI was 3.8. There were also no periodic limb movements noted. Study was deemed valid for an M SLT to follow. The MS LT shoulder 5 naps with a mean sleep latency of 0.6 minutes and to naps with sleep REM onsets. This test is diagnostic for narcolepsy the MS LT took place on 08-05-13. Today Kathryn Hendricks is here and endorsed again an Epworth Sleepiness Scale of 24 points at the fatigue severity score of 62 out of a possible 63, points. She had stopped modafinil.  in generally after she felt it had become ineffective and told Dr. Halford Chessman ,  her treating sleep physician.   She reported falling asleep and feeling tired all the time again she has been is especially being concerned because she is running asleep when driving. She has tried not to drive alone. The patient endorsed no cataplectic attacks. She also stated that the Provigil made her feel short of breath.  Just yesterday she fell asleep at the dinner table when her sister was her Guest of on her. She was very  embarrassed. She goes to bed between 7 and 8 PM and usually is asleep promptly. She will sleep through until 5-5:30 AM. She will go once to the bathroom at night on average. He may have throat little periods of waking up but usually is able to fall asleep right away again. Her sleep does not feel restorative to her. She has lots of vivid dreams and sometimes feels chased or by the nightmarish content.   Larey Seat, M.D.    She has noticed more trouble with her memory but may be fatigue related but could be an independent finding. Able to stay focused. He feels sometimes clumsy and unsteady when walking. She is upsetting her family since she begun to repeat herself frequently and has been displacing things. She has more trouble with spelling.   I would like to add that I was able to review her sleep studies here in person and an echo- cardiogram performed on 10-20 1-14 with a normal ejection fraction of 65% at grade 1 diastolic dysfunction and mild L a dilated patient. They have been previously no focal neurologic deficits noted. 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 She frequenlty fell asleep in school.  She was napping daily after school. Life long sleepy.  Review of Systems: Out of a complete 14 system review, the patient complains of only the following symptoms, and all other reviewed systems are negative.  learning disabilities.  Snoring, fatigue, sleepiness.  No seizures,  day dreaming.  Napping daily.  No cataplexy  Epworth score  24 , Fatigue severity score 62  , depression score 2    History   Social History  . Marital Status: Divorced    Spouse Name: N/A  . Number of Children: 3  . Years of Education: N/A   Occupational History  . [place] History Main Topics  . Smoking status: Never Smoker   .  Smokeless tobacco: Never Used  . Alcohol Use: No  . Drug Use: No  . Sexual Activity: No   Other Topics Concern  . Not on file   Social History Narrative   Caffeine 1 cup coffee in am.    Family History  Problem Relation Age of Onset  . Hypertension Mother   . Diabetes Mother   . Stroke Mother   . Hypertension Sister   . Hypertension Brother   . Cancer Brother     throat  . Diabetes Father   . Heart disease Father   . CAD Mother 62  . Narcolepsy Mother     not diagnosed    Past Medical History  Diagnosis Date  . Hypertension   . Hyperlipidemia   . Abnormal Pap smear 2011  . Heart failure     Past Surgical History  Procedure Laterality Date  . Appendectomy    . Tubal ligation    . Hysteroscopy w/ endometrial ablation  2010  . Dilation and curettage of uterus  2010  . Wisdom tooth extraction    . Colonoscopy      Current Outpatient Prescriptions  Medication Sig Dispense Refill  . albuterol (PROVENTIL HFA;VENTOLIN HFA) 108 (90 BASE) MCG/ACT inhaler Inhale 2 puffs into the lungs 4 (four) times daily. 1 Inhaler 0  . aspirin 81 MG EC tablet Take 1 tablet (81 mg total) by mouth daily. 90 tablet 1  . diclofenac sodium (VOLTAREN) 1 % GEL Apply 1 application topically 4 (four) times daily as needed. 1 gram to affected area for pain    . lisinopril (PRINIVIL,ZESTRIL) 2.5 MG tablet TAKE 1 TABLET BY MOUTH DAILY 90 tablet 1  . loratadine (CLARITIN) 10 MG tablet Take 10 mg by mouth daily.      Marland Kitchen omeprazole (PRILOSEC) 40 MG capsule Take 1 capsule (40 mg total) by mouth daily. 90 capsule 1  . potassium chloride (K-DUR) 10 MEQ tablet Take 1 tablet (10 mEq total) by mouth daily. 90 tablet 1  . simvastatin (ZOCOR) 40 MG tablet Take 1 tablet (40 mg total) by mouth daily. 90 tablet 1   No current facility-administered medications for this visit.    Allergies as of 11/09/2014 - Review Complete 11/09/2014  Allergen Reaction Noted  . Penicillins Shortness Of Breath, Itching, and  Swelling     Vitals: BP 120/66 mmHg  Pulse 65  Resp 12  Ht 5\' 4"  (1.626 m)  Wt 150 lb 6.4 oz (68.221 kg)  BMI 25.80 kg/m2 Last Weight:  Wt Readings from Last 1 Encounters:  11/09/14 150 lb 6.4 oz (68.221 kg)       Last Height:   Ht Readings from Last 1 Encounters:  11/09/14 5\' 4"  (1.626 m)    Physical exam:  General: The patient is awake, alert and appears not in acute  distress. The patient is well groomed. Poor dentition.  Head: Normocephalic, atraumatic. Neck is supple. Mallampati 3 ,  neck circumference: 13. Nasal airflow unrestricted , TMJ is  Not evident . Retrognathia is seen.  Cardiovascular:  Regular rate and rhythm, without  murmurs or carotid bruit, and without distended neck veins. Respiratory: Lungs are clear to auscultation. Skin:  Without evidence of edema, or rash Trunk: BMI is  elevated and patient  has normal posture.  Neurologic exam : The patient is awake and alert, oriented to place and time.   Memory subjective  described as impaired , spells of confusion, getting lost, dazing, trance. There is a restricted attention span & concentration ability.  Speech is fluent without  dysarthria, dysphonia or aphasia. Mood and affect are appropriate.  Cranial nerves: Pupils are equal and briskly reactive to light. Funduscopic exam without  evidence of pallor or edema. Extraocular movements  in vertical and horizontal planes intact and without nystagmus. Visual fields by finger perimetry are intact. Hearing to finger rub intact.  Facial sensation intact to fine touch. Facial motor strength is symmetric and tongue and uvula move midline.  Motor exam:   Normal tone ,muscle bulk and symmetric ,strength in all extremities.  Sensory:  Fine touch, pinprick and vibration were tested in all extremities. Proprioception is tested in the upper extremities only. This was  normal.  Coordination: Rapid alternating movements in the fingers/hands is normal. Finger-to-nose maneuver   normal without evidence of ataxia, dysmetria or tremor.  Gait and station: Patient walks without assistive device and is able unassisted to climb up to the exam table. Strength within normal limits. Stance is stable and normal. Tandem gait is unfragmented. Romberg testing is  negative.  Deep tendon reflexes: in the  upper and lower extremities are symmetric and intact. Babinski maneuver response is  downgoing.   Assessment:  After physical and neurologic examination, review of laboratory studies, imaging, neurophysiology testing and pre-existing records, assessment is  Given Kathryn Hendricks's limited education and the very high degree of sleepiness and fatigue I am less concerned about her Mini-Mental Status Examination or Montral cognitive assessment test results. On the Folstein Mini-Mental the patient scored 29 out of 30 points, she was able to write a sentence copy an image and right a clock face with a correct hands of the clock in place. She could name 12 animals. She was unable to do attention and calculation this was replaced with spelling backwards. During the metacarpal test to [facility] cognitive assessment test she was able to again draw a clock face and place the hands of the clock she was not able to perform a trail making test. She could name 2 out of 3 animals repeat 1 list of digits repeat sentences completely and abstract. She was completely oriented to time and place and date. She did not recall any of the 5 recall words. Again given the history of dyslexia and archived [place] I would adjust this test is documenting only a mild cognitive impairment. I think further testing should be initiated when her narcolepsy is controlled.    The patient was advised of the nature of the diagnosed sleep disorder , the treatment options and risks for general a health and wellness arising from not treating the condition. Visit duration was 45 minutes.   Plan:  Treatment plan and additional workup  : Failed Modafinil in 2 Forms, and  ritalin was used. I will prescribe Adderall first , and if that will not help with  EDS sufficiently , change her to [facility]. RV in 8 weeks with me, 30 minutes , MOCA and MMSE again.  Referral to Dr. Valentina Shaggy.   Asencion Partridge Bobbyjo Marulanda MD  11/09/2014

## 2014-12-02 ENCOUNTER — Encounter: Payer: Self-pay | Admitting: Cardiovascular Disease

## 2014-12-02 ENCOUNTER — Ambulatory Visit (INDEPENDENT_AMBULATORY_CARE_PROVIDER_SITE_OTHER): Payer: 59 | Admitting: Cardiovascular Disease

## 2014-12-02 VITALS — BP 140/80 | HR 55 | Ht 64.0 in | Wt 148.8 lb

## 2014-12-02 DIAGNOSIS — R002 Palpitations: Secondary | ICD-10-CM

## 2014-12-02 NOTE — Patient Instructions (Signed)
Your physician recommends that you schedule a follow-up appointment as needed with Dr. McAlhany   

## 2014-12-02 NOTE — Progress Notes (Signed)
History of Present Illness: 59 yo female with history of HTN, HLD, GERD, narcolepsy who is here today for cardiac follow up. I saw her as a new patient 10/26/13 for cardiovascular examination and to discuss dyspnea and palpitations while taking Ritalin. She has no prior documented cardiac disease. Echo 06/16/13 with normal LV size and function, left atrial enlargement. She told me that she has had episodes her heart racing almost every day for years.  The palpitations worsened while taking Ritalin. I arranged a 48 hour monitor and an exercise treadmill stress test. She did not reach target heart rate on her stress test. We tried to arrange a [employer] but we could not reach her. 48 hour monitor showed sinus with rare PACs and rare PVCs.   She is here today for follow up. She has been seen in Neurology and also in Pulmonary for her narcolepsy. She is now on Adderall. She has rare palpitations. No chest pain or SOB.    Primary Care Physician: Gwendlyn Deutscher  Last Lipid Profile:Lipid Panel     Component Value Date/Time   CHOL 178 09/10/2013 1136   TRIG 70 09/10/2013 1136   HDL 59 09/10/2013 1136   CHOLHDL 3.0 09/10/2013 1136   VLDL 14 09/10/2013 1136   LDLCALC 105* 09/10/2013 1136     Past Medical History  Diagnosis Date  . Hypertension   . Hyperlipidemia   . Abnormal Pap smear 2011  . Heart failure     Past Surgical History  Procedure Laterality Date  . Appendectomy    . Tubal ligation    . Hysteroscopy w/ endometrial ablation  2010  . Dilation and curettage of uterus  2010  . Wisdom tooth extraction    . Colonoscopy      Current Outpatient Prescriptions  Medication Sig Dispense Refill  . albuterol (PROVENTIL HFA;VENTOLIN HFA) 108 (90 BASE) MCG/ACT inhaler Inhale 2 puffs into the lungs 4 (four) times daily. 1 Inhaler 0  . amphetamine-dextroamphetamine (ADDERALL XR) 15 MG 24 hr capsule Take 1 capsule by mouth every morning. 30 capsule 0  . aspirin 81 MG EC tablet Take 1 tablet  (81 mg total) by mouth daily. 90 tablet 1  . diclofenac sodium (VOLTAREN) 1 % GEL Apply 1 application topically 4 (four) times daily as needed. 1 gram to affected area for pain    . lisinopril (PRINIVIL,ZESTRIL) 2.5 MG tablet TAKE 1 TABLET BY MOUTH DAILY 90 tablet 1  . loratadine (CLARITIN) 10 MG tablet Take 10 mg by mouth daily.      Marland Kitchen omeprazole (PRILOSEC) 40 MG capsule Take 1 capsule (40 mg total) by mouth daily. 90 capsule 1  . potassium chloride (K-DUR) 10 MEQ tablet Take 1 tablet (10 mEq total) by mouth daily. 90 tablet 1  . simvastatin (ZOCOR) 40 MG tablet Take 1 tablet (40 mg total) by mouth daily. 90 tablet 1   No current facility-administered medications for this visit.    Allergies  Allergen Reactions  . Penicillins Shortness Of Breath, Itching and Swelling    History   Social History  . Marital Status: Divorced    Spouse Name: N/A  . Number of Children: 3  . Years of Education: N/A   Occupational History  . [place] History Main Topics  . Smoking status: Never Smoker   . Smokeless tobacco: Never Used  . Alcohol Use: No  . Drug Use: No  . Sexual Activity: No   Other Topics Concern  .  Not on file   Social History Narrative   Caffeine 1 cup coffee in am.    Family History  Problem Relation Age of Onset  . Hypertension Mother   . Diabetes Mother   . Stroke Mother   . Hypertension Sister   . Hypertension Brother   . Cancer Brother     throat  . Diabetes Father   . Heart disease Father   . CAD Mother 50  . Narcolepsy Mother     not diagnosed    Review of Systems:  As stated in the HPI and otherwise negative.   BP 140/80 mmHg  Pulse 55  Ht 5\' 4"  (1.626 m)  Wt 148 lb 12.8 oz (67.495 kg)  BMI 25.53 kg/m2  Physical Examination: General: Well developed, well nourished, NAD HEENT: OP clear, mucus membranes moist SKIN: warm, dry. No rashes. Neuro: No focal deficits Musculoskeletal: Muscle strength 5/5 all ext Psychiatric:  Mood and affect normal Neck: No JVD, no carotid bruits, no thyromegaly, no lymphadenopathy. Lungs:Clear bilaterally, no wheezes, rhonci, crackles Cardiovascular: Regular rate and rhythm. No murmurs, gallops or rubs. Abdomen:Soft. Bowel sounds present. Non-tender.  Extremities: No lower extremity edema. Pulses are 2 + in the bilateral DP/PT.  Echo 06/16/13:  Left ventricle: The cavity size was normal. Wall thickness was normal. Systolic function was vigorous. The estimated ejection fraction was in the range of 60% to 70%. Wall motion was normal; there were no regional wall motion abnormalities. Doppler parameters are consistent with abnormal left ventricular relaxation (grade 1 diastolic dysfunction). - Left atrium: The atrium was mildly to moderately dilated.  EKG:  EKG is ordered today. The ekg ordered today demonstrates Sinus brady, rate 55 bpm.   Recent Labs: 06/22/2014: BUN 14; Creatinine 0.65; Potassium 4.4; Sodium 140   Lipid Panel    Component Value Date/Time   CHOL 178 09/10/2013 1136   TRIG 70 09/10/2013 1136   HDL 59 09/10/2013 1136   CHOLHDL 3.0 09/10/2013 1136   VLDL 14 09/10/2013 1136   LDLCALC 105* 09/10/2013 1136   LDLDIRECT 136* 08/09/2008 2048     Wt Readings from Last 3 Encounters:  12/02/14 148 lb 12.8 oz (67.495 kg)  11/09/14 150 lb 6.4 oz (68.221 kg)  10/25/14 153 lb (69.4 kg)     Other studies Reviewed: Additional studies/ records that were reviewed today include: . Review of the above records demonstrates:    Assessment and Plan:   1. Palpitations: She has rare palpitations, likely due to premature beats as shown on her 48 hour monitor in 2015. Echo with normal LV function in October 2014.  Likely that her medications for narcolepsy will worsen the premature beats.   Current medicines are reviewed at length with the patient today.  The patient does not have concerns regarding medicines.  The following changes have been made:  no  change  Labs/ tests ordered today include:  No orders of the defined types were placed in this encounter.   Disposition:   F/U prn    Signed, Lauree Chandler, MD 12/02/2014 3:04 PM    [facility] [address], [place], [place]  [address] Phone: [phone]; Fax: [phone]

## 2014-12-03 ENCOUNTER — Encounter: Payer: Self-pay | Admitting: Cardiovascular Disease

## 2014-12-06 ENCOUNTER — Other Ambulatory Visit: Payer: Self-pay | Admitting: *Deleted

## 2014-12-06 ENCOUNTER — Ambulatory Visit
Admission: RE | Admit: 2014-12-06 | Discharge: 2014-12-06 | Disposition: A | Discharge status: Home / Self Care | Payer: 59 | Source: Ambulatory Visit

## 2014-12-06 DIAGNOSIS — Z1231 Encounter for screening mammogram for malignant neoplasm of breast: Secondary | ICD-10-CM

## 2014-12-06 MED ORDER — LISINOPRIL 2.5 MG PO TABS: ORAL_TABLET | ORAL | Status: DC

## 2014-12-07 ENCOUNTER — Encounter: Payer: Self-pay | Admitting: Family Medicine

## 2014-12-10 ENCOUNTER — Other Ambulatory Visit: Payer: Self-pay | Admitting: Neurology

## 2014-12-10 ENCOUNTER — Telehealth: Payer: Self-pay | Admitting: Neurology

## 2014-12-10 DIAGNOSIS — G3184 Mild cognitive impairment, so stated: Secondary | ICD-10-CM

## 2014-12-10 DIAGNOSIS — G47419 Narcolepsy without cataplexy: Secondary | ICD-10-CM

## 2014-12-10 DIAGNOSIS — F900 Attention-deficit hyperactivity disorder, predominantly inattentive type: Secondary | ICD-10-CM

## 2014-12-10 NOTE — Telephone Encounter (Signed)
Recieved message that patient "Needs refill of sleep meds". Tried calling back twice but no one answered, phone just rang off the hook. No voice mail.

## 2014-12-16 ENCOUNTER — Other Ambulatory Visit: Payer: Self-pay

## 2014-12-16 DIAGNOSIS — F900 Attention-deficit hyperactivity disorder, predominantly inattentive type: Secondary | ICD-10-CM

## 2014-12-16 DIAGNOSIS — G3184 Mild cognitive impairment, so stated: Secondary | ICD-10-CM

## 2014-12-16 DIAGNOSIS — G47419 Narcolepsy without cataplexy: Secondary | ICD-10-CM

## 2014-12-16 MED ORDER — AMPHETAMINE-DEXTROAMPHET ER 15 MG PO CP24: 15.0000 mg | ORAL_CAPSULE | ORAL | Status: DC

## 2014-12-16 NOTE — Telephone Encounter (Signed)
Patient called and stated that she is experiences side effects of fatigue and incontinence during the night after taking Rx amphetamine-dextroamphetamine (ADDERALL XR) 15 MG 24 hr capsule. Please call and advise.  DW

## 2014-12-16 NOTE — Telephone Encounter (Signed)
Adderall Rx entered, forwarded to [facility] for approval since Dr Brett Fairy is out of the office.  Thank you!

## 2014-12-16 NOTE — Telephone Encounter (Signed)
She hasn't taken the Adderall in over a week. She urinated in her bed last night. She wants to know if the adderall could have caused it. Incontinence is usually not a side effect and considering she hasn't taken the adderal in a weel, it likely isn't related. She has an appointment with Dr. Brett Fairy next month and will discuss with her at that time. Patient has narcolepsy and suggested if she needs to be seen sooner, can schedule an appointment with Dr. Rexene Alberts.   She is requesting a refill on the adderall. Janett Billow, please review. Thank you

## 2014-12-16 NOTE — Telephone Encounter (Signed)
Completed, given to emma thanks

## 2014-12-16 NOTE — Telephone Encounter (Signed)
Request entered, forwarded to [facility] for approval because Dr Dohmeier is out of the office.   

## 2014-12-17 ENCOUNTER — Telehealth: Payer: Self-pay | Admitting: *Deleted

## 2014-12-17 NOTE — Telephone Encounter (Signed)
Left detailed message for patient to let her know that the Rx for adderral is ready to be picked up here at the office and for her to bring a valid driver's license with her. I told her our office closes at 12 pm and we re-open on Monday at [place].

## 2015-01-17 ENCOUNTER — Ambulatory Visit (INDEPENDENT_AMBULATORY_CARE_PROVIDER_SITE_OTHER): Payer: 59 | Admitting: Neurology

## 2015-01-17 ENCOUNTER — Encounter: Payer: Self-pay | Admitting: Neurology

## 2015-01-17 VITALS — BP 124/80 | HR 64 | Resp 18 | Ht 64.17 in | Wt 153.0 lb

## 2015-01-17 DIAGNOSIS — G4710 Hypersomnia, unspecified: Secondary | ICD-10-CM

## 2015-01-17 DIAGNOSIS — R4189 Other symptoms and signs involving cognitive functions and awareness: Secondary | ICD-10-CM | POA: Diagnosis not present

## 2015-01-17 DIAGNOSIS — F819 Developmental disorder of scholastic skills, unspecified: Secondary | ICD-10-CM | POA: Diagnosis not present

## 2015-01-17 MED ORDER — SODIUM OXYBATE 500 MG/ML PO SOLN: ORAL | Status: DC

## 2015-01-17 NOTE — Progress Notes (Addendum)
SLEEP MED[facility]INE CLINIC   Provider:  Larey Seat, M D  Referring Provider: Kinnie Feil, MD Primary Care Physician:  Andrena Mews, MD  Chief Complaint  Patient presents with  . Follow-up    narcolepsy, mild cognitive impairment, rm 10, alone    HPI:  Kathryn Hendricks is a 59 y.o. female , a new patient ,  seen here as a referral  from Dr. Gwendlyn Deutscher for treatment of recently diagnosed Narcolepsy and evaluation of memory concerns.   11-09-14 Kathryn Hendricks reports that she had increasing difficulties with sleepiness and felt increasingly fatigued also she was not aware that she had physically exerted herself. Her primary care physician at the time was [name] and he referred her for a sleep study in 2014 she underwent a PSG on 06-09-13 and had a very normal PSG with a sleep latency of 3 minutes but a REM latency of 67.5 minutes. Her sleep efficiency was 85.3% and her total sleep time 331 minutes. There were no significant apneas or hypopneas noted her AHI was 3.8.  There were  no periodic limb movements noted. Study was deemed valid for an M SLT to follow.  The MS LT shoulder 5 naps with a mean sleep latency of 0.6 minutes and two naps with sleep REM onsets.  This test is diagnostic for narcolepsy , the MSLT took place on 08-05-13. Today Kathryn Hendricks is here and endorsed again an Epworth Sleepiness Scale of 24 points at the fatigue severity score of 62 out of a possible 63, points. She had stopped modafinil.  in generally after she felt it had become ineffective and told Dr. Halford Chessman ,  her treating sleep physician.    She reported falling asleep and feeling tired all the time again she has been is especially being concerned because she is running asleep when driving. She has tried not to drive alone. The patient endorsed no cataplectic attacks. She also stated that the Provigil made her feel short of breath. Just yesterday she fell asleep at the dinner table when her sister was her  Guest of on her. She was very embarrassed. She goes to bed between 7 and 8 PM and usually is asleep promptly. She will sleep through until 5-5:30 AM. She will go once to the bathroom at night on average. He may have throat little periods of waking up but usually is able to fall asleep right away again. Her sleep does not feel restorative to her. She has lots of vivid dreams and sometimes feels chased or by the nightmarish content.  She has noticed more trouble with her memory but may be fatigue related but could be an independent finding. Able to stay focused. He feels sometimes clumsy and unsteady when walking. She is upsetting her family since she begun to repeat herself frequently and has been displacing things. She has more trouble with spelling.  I would like to add that I was able to review her sleep studies here in person and an echo- cardiogram performed on 06-16-13 with a normal ejection fraction of 65% at grade 1 diastolic dysfunction and mild L a dilated patient. They have been previously no focal neurologic deficits noted.   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  She frequenlty fell asleep in school.  She was napping daily after school. Life long sleepy. She had a sleep  study at [facility]. Dr . Halford Chessman  has followed her.   Revisit from 01-17-15, CD Kathryn Hendricks returns today for a memory and cognitive evaluation. She scored 19 points on an test and will see a and could not spell the word " world" , cannot subtract 7 from 100s stating that she has been "bad  With numbers . Difficulties to draw/ copying a three-dimensional object. She was able to draw clock face and place the hands at 11:10.,  He remains excessively sleepy and modafinil has stopped working for her sleepiness.  She did not endorsed the Epworth score the fatigue  severity scale and today's visit. A marker test was performed as stated above. Concerned about her cognitive findings and the possibility that this was a lifelong occurrence for her I would like to refer her to Dr. Ethelda Chick or to the [facility] office. There may be a learning disabilities that mimics cognitive decline and in addition but could be depression it also can be manifesting as a pseudodementia.  Review of Systems: Out of a complete 14 system review, the patient complains of only the following symptoms, and all other reviewed systems are negative.  learning disabilities.  Snoring, fatigue, sleepiness.  No seizures,  day dreaming.  Napping daily.  No cataplexy  Epworth score  24 , Fatigue severity score 62  , depression score 2    History   Social History  . Marital Status: Divorced    Spouse Name: N/A  . Number of Children: 3  . Years of Education: N/A   Occupational History  . [place] History Main Topics  . Smoking status: Never Smoker   . Smokeless tobacco: Never Used  . Alcohol Use: No  . Drug Use: No  . Sexual Activity: No   Other Topics Concern  . Not on file   Social History Narrative   Caffeine 1 cup coffee in am.    Family History  Problem Relation Age of Onset  . Hypertension Mother   . Diabetes Mother   . Stroke Mother   . Hypertension Sister   . Hypertension Brother   . Cancer Brother     throat  . Diabetes Father   . Heart disease Father   . CAD Mother 105  . Narcolepsy Mother     not diagnosed    Past Medical History  Diagnosis Date  . Hypertension   . Hyperlipidemia   . Abnormal Pap smear 2011  . Heart failure     Past Surgical History  Procedure Laterality Date  . Appendectomy    . Tubal ligation    . Hysteroscopy w/ endometrial ablation  2010  . Dilation and curettage of uterus  2010  . Wisdom tooth extraction    . Colonoscopy      Current Outpatient Prescriptions  Medication  Sig Dispense Refill  . albuterol (PROVENTIL HFA;VENTOLIN HFA) 108 (90 BASE) MCG/ACT inhaler Inhale 2 puffs into the lungs 4 (four) times daily. 1 Inhaler 0  . amphetamine-dextroamphetamine (ADDERALL XR) 15 MG 24 hr capsule Take 1 capsule by mouth every morning. 30 capsule 0  . aspirin 81 MG EC tablet Take 1 tablet (81 mg total) by mouth daily. 90 tablet 1  . diclofenac sodium (VOLTAREN) 1 % GEL Apply 1 application topically 4 (four) times daily as needed. 1 gram to affected area for pain    . lisinopril (PRINIVIL,ZESTRIL) 2.5 MG tablet TAKE 1 TABLET BY MOUTH DAILY  90 tablet 1  . loratadine (CLARITIN) 10 MG tablet Take 10 mg by mouth daily.      Marland Kitchen omeprazole (PRILOSEC) 40 MG capsule Take 1 capsule (40 mg total) by mouth daily. 90 capsule 1  . potassium chloride (K-DUR) 10 MEQ tablet Take 1 tablet (10 mEq total) by mouth daily. 90 tablet 1  . simvastatin (ZOCOR) 40 MG tablet Take 1 tablet (40 mg total) by mouth daily. 90 tablet 1   No current facility-administered medications for this visit.    Allergies as of 01/17/2015 - Review Complete 01/17/2015  Allergen Reaction Noted  . Penicillins Shortness Of Breath, Itching, and Swelling     Vitals: BP 124/80 mmHg  Pulse 64  Resp 18 Last Weight:  Wt Readings from Last 1 Encounters:  12/02/14 148 lb 12.8 oz (67.495 kg)       Last Height:   Ht Readings from Last 1 Encounters:  12/02/14 5\' 4"  (1.626 m)    Physical exam:  General: The patient is awake, alert and appears not in acute distress. The patient is well groomed. Poor dentition.  Head: Normocephalic, atraumatic. Neck is supple. Mallampati 3 ,  neck circumference: 13. Nasal airflow unrestricted , TMJ is  Not evident . Retrognathia is seen.  Cardiovascular:  Regular rate and rhythm, without  murmurs or carotid bruit, and without distended neck veins. Respiratory: Lungs are clear to auscultation. Skin:  Without evidence of edema, or rash Trunk: BMI is  elevated and patient  has normal  posture.  Neurologic exam : The patient is awake and alert, oriented to place and time.   Memory subjective  described as impaired , spells of confusion, getting lost, dazing, trance. There is a restricted attention span & concentration ability. The patient reports that she doesn't feel depressed but her pH Q score indicates that she could well be depressed. She feels often as if she denied twilight zone between dream and reality not sure if she is awake or not. She will also have dream intrusion into alertness stages she will ask surrounding members of her family or colleagues did I already do this or did I already say this if she dreamt to content only. He is also sleep talking. Her daughter has witnessed her to be more and more daytime sleepy. I think our best approach after she felt modafinil is to try Xyrem.   Speech is fluent without  dysarthria, dysphonia or aphasia. Mood and affect are appropriate.  Cranial nerves: Pupils are equal and briskly reactive to light. Funduscopic exam without  evidence of pallor or edema. Extraocular movements  in vertical and horizontal planes intact and without nystagmus. Visual fields by finger perimetry are intact. Hearing to finger rub intact.  Facial sensation intact to fine touch. Facial motor strength is symmetric and tongue and uvula move midline.  Motor exam:   Normal tone ,muscle bulk and symmetric ,strength in all extremities.  Sensory:  Fine touch, pinprick and vibration were tested in all extremities. Proprioception is tested in the upper extremities only. This was  normal.  Coordination: Rapid alternating movements in the fingers/hands is normal. Finger-to-nose maneuver  normal without evidence of ataxia, dysmetria or tremor.  Gait and station: Patient walks without assistive device and is able unassisted to climb up to the exam table. Strength within normal limits. Stance is stable and normal. Tandem gait is unfragmented. Romberg testing is   negative.  Deep tendon reflexes: in the  upper and lower extremities are symmetric and intact.  Babinski maneuver response is  downgoing.   Assessment:  After physical and neurologic examination, review of laboratory studies, imaging, neurophysiology testing and pre-existing records, assessment is  Given Mrs. Shell's limited education and the very high degree of sleepiness and fatigue I am less concerned about her Mini-Mental Status Examination or [place] cognitive assessment test results. On the Folstein Mini-Mental the patient scored 29 out of 30 points, she was able to write a sentence copy an image and right a clock face with a correct hands of the clock in place. She could name 12 animals. She was unable to do attention and calculation this was replaced with spelling backwards. During the metacarpal test to [facility] cognitive assessment test she was able to again draw a clock face and place the hands of the clock she was not able to perform a trail making test. She could name 2 out of 3 animals repeat 1 list of digits repeat sentences completely and abstract. She was completely oriented to time and place and date. She did not recall any of the 5 recall words.   The patient was advised of the nature of the diagnosed sleep disorder , the treatment options and risks for general a health and wellness arising from not treating the condition. Visit duration was 45 minutes.   Plan:  Treatment plan and additional workup : Failed Modafinil in 2 Forms, and  ritalin was used. I will prescribe Adderall first , and if that will not help with EDS sufficiently , change her to [facility]. RV in 8 weeks with NP , 30 minutes , MOCA and MMSE again.  Referral to Dr. Valentina Shaggy for neuro-psychological testing.  Brain MRI order . I will consider ordering XYREM,  if MRI is negative.   RV with Np in 3 month. 30 minutes.   Asencion Partridge Ranell Skibinski MD  01/17/2015

## 2015-01-17 NOTE — Patient Instructions (Signed)
Mild Neurocognitive Disorder Mild neurocognitive disorder (formerly known as mild cognitive impairment) is a mental disorder. It is a slight abnormal decrease in mental function. The areas of mental function affected may include memory, thought, communication, behavior, and completion of tasks. The decrease is noticeable and measurable but for the most part does not interfere with your daily activities. Mild neurocognitive disorder typically occurs in people older than 60 years but can occur earlier. It is not as serious as major neurocognitive disorder (formerly known as dementia) but may lead to a more serious neurocognitive disorder. However, in some cases the condition does not get worse. A few people with this disorder even improve. CAUSES  There are a number of different causes of mild neurocognitive disorder:   Brain disorders associated with abnormal protein deposits, such as Alzheimer's disease, Pick's disease, and Lewy body disease.  Brain disorders associated with abnormal movement, such as Parkinson's disease and Huntington's disease.  Diseases affecting blood vessels in the brain and resulting in mini-strokes.  Certain infections, such as human immunodeficiency virus (HIV) infection.  Traumatic brain injury.  Other medical conditions such as brain tumors, underactive thyroid (hypothyroidism), and vitamin B12 deficiency.  Use of certain prescription medicine and "recreational" drugs. SYMPTOMS  Symptoms of mild neurocognitive disorder include:  Difficulty remembering. You may forget details of recent events, names, or phone numbers. You may forget important social events and appointments or repeatedly forget where you put your car keys.  Difficulty thinking and solving problems. You may have trouble with complex tasks such as paying bills or driving in unfamiliar locations.  Difficulty communicating. You may have trouble finding the right word, naming an object, forming a  sentence that makes sense, or understanding what you read or hear.  Changes in your behavior or personality. You may lose interest in the things that you used to enjoy or withdraw from social situations. You may get angry more easily than usual. You may act before thinking. You may do things in public that you would not usually do. You may hear or see things that are not real (hallucinations). You may believe falsely that others are trying to hurt you (paranoia). DIAGNOSIS Mild neurocognitive disorder is diagnosed through an assessment by your health care provider. Your health care provider will ask you and your family, friends, or coworkers questions about your symptoms. He or she will ask how often the symptoms occur, how long they have been occurring, whether they are getting worse, and the effect they are having on your life. Your health care provider may refer you to a neurologist or mental health specialist for a detailed evaluation of your mental functions (neuropsychological testing).  To identify the cause of your mild neurocognitive disorder, your health care provider may:  Obtain a detailed medical history.  Ask about alcohol and drug use, including prescription medicine.  Perform a physical exam.  Order blood tests and brain imaging exams. TREATMENT  Mild neurocognitive disorder caused by infections, use of certain medicines or "recreational" drugs, and certain medical conditions may improve with treatment of the condition that is causing the disorder. Mild neurocognitive disorder resulting from other causes generally does not improve and may worsen. In these cases, the goal of treatment is to slow progression of the disorder and help you cope with the loss of mental function. Treatments in these cases include:   Medicine. Medicine helps mainly with memory loss and behavioral symptoms.   Talk therapy. Talk therapy provides education, emotional support, memory aids, and other   include:   · Medicine. Medicine helps mainly with memory loss and behavioral symptoms.    · Talk therapy. Talk therapy provides education, emotional support, memory aids, and other ways of  making up for decreases in mental function.       · Lifestyle changes. These include regular exercise, a healthy diet (including essential omega-3 fatty acids), intellectual stimulation, and increased social interaction.  Document Released: 04/15/2013 Document Revised: 12/28/2013 Document Reviewed: 04/15/2013  [employer]® Patient Information ©2015 [employer]. This information is not intended to replace advice given to you by your health care provider. Make sure you discuss any questions you have with your health care provider.

## 2015-01-17 NOTE — Addendum Note (Signed)
Addended by: Larey Seat on: 01/17/2015 03:48 PM   Modules accepted: Orders, Level of Service

## 2015-01-18 LAB — COMPREHENSIVE METABOLIC PANEL
ALT: 21 IU/L (ref 0–32)
AST: 18 IU/L (ref 0–40)
Albumin/Globulin Ratio: 1.8 (ref 1.1–2.5)
Albumin: 4 g/dL (ref 3.5–5.5)
Alkaline Phosphatase: 87 IU/L (ref 39–117)
BUN / CREAT RATIO: 25 — AB (ref 9–23)
BUN: 14 mg/dL (ref 6–24)
Bilirubin Total: <0.2 mg/dL (ref 0.0–1.2)
CALCIUM: 9.2 mg/dL (ref 8.7–10.2)
CO2: 26 mmol/L (ref 18–29)
Chloride: 105 mmol/L (ref 97–108)
Creatinine, Ser: 0.57 mg/dL (ref 0.57–1.00)
GFR calc Af Amer: 119 mL/min/{1.73_m2} (ref >59)
GFR calc non Af Amer: 103 mL/min/{1.73_m2} (ref >59)
GLOBULIN, TOTAL: 2.2 g/dL (ref 1.5–4.5)
Glucose: 90 mg/dL (ref 65–99)
POTASSIUM: 4 mmol/L (ref 3.5–5.2)
Sodium: 144 mmol/L (ref 134–144)
Total Protein: 6.2 g/dL (ref 6.0–8.5)

## 2015-01-25 ENCOUNTER — Telehealth: Payer: Self-pay

## 2015-01-25 NOTE — Telephone Encounter (Signed)
Left message on home machine [phone] per DPR informing pt that her labs were normal but Dr. Brett Fairy encouraged her to drink more water. Encouraged pt to call back with any questions or concerns.

## 2015-01-27 ENCOUNTER — Ambulatory Visit (INDEPENDENT_AMBULATORY_CARE_PROVIDER_SITE_OTHER): Payer: 59

## 2015-01-27 DIAGNOSIS — G4710 Hypersomnia, unspecified: Secondary | ICD-10-CM

## 2015-01-27 DIAGNOSIS — R4189 Other symptoms and signs involving cognitive functions and awareness: Secondary | ICD-10-CM

## 2015-02-07 ENCOUNTER — Telehealth: Payer: Self-pay

## 2015-02-07 NOTE — Telephone Encounter (Signed)
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 NOt MS, no tumor, no strokes. This is how the brain was born. No therapy for congenital changes. CD .

## 2015-02-07 NOTE — Telephone Encounter (Signed)
Called pt to give her MRI results. No answer, left message asking her to call me back.

## 2015-02-07 NOTE — Telephone Encounter (Signed)
Patient returned call, please call and advise.

## 2015-02-07 NOTE — Telephone Encounter (Signed)
Spoke to pt and relayed information about her MRI brain. Explained in detail what "congenital" means. Emphasized that her MRI showed nothing acute and that there is no therapy for congenital changes. Pt has lots of questions but agreed to keep her 7/28 appt with Hoyle Sauer, NP to discuss further.

## 2015-02-07 NOTE — Telephone Encounter (Signed)
Returned pt's phone call. Again, left a message asking her to call me back.

## 2015-02-10 ENCOUNTER — Other Ambulatory Visit: Payer: Self-pay | Admitting: Family Medicine

## 2015-02-10 NOTE — Telephone Encounter (Signed)
Kathryn Hendricks is here and needs her potassium chloride (K-DUR) 10 MEQ tablet refilled, she is currently out of this medication as she didn't realize that the prescription had expired, would like to have this today if possible. Please call her once it is ready thank you. Sadie Reynolds, ASA

## 2015-02-10 NOTE — Telephone Encounter (Signed)
Pt informed that potassium is no longer needed due her levels have been normal since 2014 up until May 2016.  Pt stated understanding.  Derl Barrow, RN

## 2015-02-10 NOTE — Telephone Encounter (Signed)
Please inform patient her potassium level has always been normal since 2014 up till May 2016, no indication to be on Kdur. Refill declined.

## 2015-03-01 ENCOUNTER — Telehealth: Payer: Self-pay

## 2015-03-01 NOTE — Telephone Encounter (Signed)
Pt called and left a message with the research department about how she cannot stay awake, she is very fatigued and has no energy, and she feels terrible. I called pt back. Pt states the above. Pt wants to know if she should be taking any medication to keep her awake and that Dr. Brett Fairy told her not to take any medications until her MRI was discussed with her. I called her MRI results on 6/13, which showed nothing acute, but congenital changes. Pt again asked me to explain in great detail what her MRI showed. I explained again that there is no therapy for congenital changes, it was just how her brain was made. I explained this three times.  I see that Dr. Brett Fairy has prescribed her adderall and xyrem and will verify if pt should be taking these medications with Dr. Brett Fairy when she is back in the office. Made an appt with Dr. Brett Fairy on 8/4 at 2:00 with pt. Pt verbalized understanding.

## 2015-03-07 ENCOUNTER — Telehealth: Payer: Self-pay

## 2015-03-07 NOTE — Telephone Encounter (Signed)
Called pt to tell her that I spoke to Dr. Brett Fairy regarding the pt continuing to take her adderall. Dr. Brett Fairy said to continue to take the adderall as prescribed, there is no reason to stop it at this time. I left a message (per DPR) on pt's home phone with this information and asked her to call us back with any questions.

## 2015-03-10 ENCOUNTER — Other Ambulatory Visit: Payer: Self-pay | Admitting: Family Medicine

## 2015-03-23 ENCOUNTER — Telehealth: Payer: Self-pay

## 2015-03-23 NOTE — Telephone Encounter (Signed)
[facility] has confirmed the patient is enrolled with the current [facility].

## 2015-03-24 ENCOUNTER — Ambulatory Visit: Payer: 59 | Admitting: Nurse Practitioner

## 2015-03-24 ENCOUNTER — Telehealth: Payer: Self-pay

## 2015-03-24 NOTE — Telephone Encounter (Signed)
Marya Amsler with [facility] is calling to advise the patient does not want to start Xyrem at this time. The patient does have an appointment soon with the doctor and will discuss then.. Any questions please call  then option 3, then option 4. Thank you.

## 2015-03-24 NOTE — Telephone Encounter (Signed)
*-$   is fine, needs labs.

## 2015-03-24 NOTE — Telephone Encounter (Signed)
Noted. Pt had called [place] previously and asked to make a sooner appt to discuss her xyrem and adderall. Dr. Brett Fairy and pt will discuss at her 8/4 appt.

## 2015-03-24 NOTE — Telephone Encounter (Signed)
Kathryn Hendricks with [facility] is calling to advise the patient does not want to start Xyrem at this time.  Patient says she will discuss medication management with provider at [facility] on 08/04.

## 2015-03-24 NOTE — Telephone Encounter (Signed)
This info has been forwarded to provider.

## 2015-03-25 ENCOUNTER — Other Ambulatory Visit: Payer: Self-pay | Admitting: Family Medicine

## 2015-03-31 ENCOUNTER — Ambulatory Visit: Payer: Self-pay | Admitting: Neurology

## 2015-04-07 ENCOUNTER — Telehealth: Payer: Self-pay | Admitting: Neurology

## 2015-04-07 NOTE — Telephone Encounter (Signed)
Patient wanted to make Dr. 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 She is very fatigued and would like a call back regarding what she should do. Best number is [phone].

## 2015-04-07 NOTE — Telephone Encounter (Signed)
Spoke to pt. She wanted to make Dr. Brett Fairy aware that she is not taking the xyrem. She was told by [facility] that there might be side effects such as vomiting, and pt says that she has to work, and cannot be tied down because of medication side effects. I inquired about her cancelled appt on 8/4. She said she had to work that day so she made another appt for 8/31. I advised her that Dr. Brett Fairy would discuss other medication options at that visit. Pt verbalized understanding.

## 2015-04-12 ENCOUNTER — Telehealth: Payer: Self-pay | Admitting: *Deleted

## 2015-04-12 NOTE — Telephone Encounter (Addendum)
Patient came by the office today to request a letter.  She needs a letter stating she still suffers from Narcolepsy with the dates she was last seen and that she is still being treated in order for her FMLA to be extended until her next office visit.  If  You have questions please call her.  Otherwise please fax the letter to [facility] [place].  Signed release form on file.

## 2015-04-12 NOTE — Telephone Encounter (Signed)
Letter faxed to [facility] at [facility]. Fax confirmation received.

## 2015-04-27 ENCOUNTER — Encounter: Payer: Self-pay | Admitting: Neurology

## 2015-04-27 ENCOUNTER — Telehealth: Payer: Self-pay | Admitting: *Deleted

## 2015-04-27 ENCOUNTER — Ambulatory Visit (INDEPENDENT_AMBULATORY_CARE_PROVIDER_SITE_OTHER): Payer: 59 | Admitting: Neurology

## 2015-04-27 VITALS — BP 122/80 | HR 78 | Resp 20 | Ht 64.0 in | Wt 163.0 lb

## 2015-04-27 DIAGNOSIS — F900 Attention-deficit hyperactivity disorder, predominantly inattentive type: Secondary | ICD-10-CM | POA: Diagnosis not present

## 2015-04-27 DIAGNOSIS — G3184 Mild cognitive impairment, so stated: Secondary | ICD-10-CM

## 2015-04-27 DIAGNOSIS — G47419 Narcolepsy without cataplexy: Secondary | ICD-10-CM | POA: Diagnosis not present

## 2015-04-27 MED ORDER — AMPHETAMINE-DEXTROAMPHET ER 30 MG PO CP24: ORAL_CAPSULE | ORAL | Status: DC

## 2015-04-27 NOTE — Progress Notes (Signed)
[facility]   Provider:  Larey Seat, M D  Referring Provider: Kinnie Feil, MD Primary Care Physician:  Andrena Mews, MD  Chief Complaint  Patient presents with  . Follow-up    narcolepsy, pt denies taking adderall, pt hasn't started xyrem, she is concerned about vomiting as a side effect and the [facility] advised pt she should take it, wants to discuss MRI results, and meds to keep her awake, rm 10, alone    HPI:  Kathryn Hendricks is a 59 y.o. female , seen here as arevisit  from Dr. Gwendlyn Deutscher .  The patient was last seen on 01-19-15 .Kathryn Hendricks is a 59 y.o. female  seen here as a referral  from Dr. Gwendlyn Deutscher for treatment of recently diagnosed Narcolepsy and evaluation of memory concerns.   Kathryn Hendricks reports that she had increasing difficulties with sleepiness and felt increasingly fatigued,  she was not aware that she had physically exerted herself.  Her primary care physician at the time was [name] and he referred her for a sleep study in 2014.  She underwent  [facility]  [facility], resulting in a very normal PSG , but with a sleep latency of 3 minutes , a REM latency was of  67.5 minutes.  Her sleep efficiency was 85.3% and her total sleep time 331 minutes. There were no significant apneas or hypopneas noted, and  her AHI was 3.8.  There were no periodic limb movements noted. Study was deemed valid for an MSLT to follow.  The MSLT documented  5 naps with a mean sleep latency of 0.6 minutes and two naps with sleep REM onsets.  This test is diagnostic for the condition of narcolepsy , the MSLT took place on 08-05-13.   Kathryn Hendricks has endorsed again an Epworth Sleepiness Scale of 24 points at the fatigue severity score of 62 out of a possible 63, points. She had stopped modafinil in January after she felt it had become ineffective and told this to Dr. Halford Chessman ,  her treating sleep physician, after the fact.  She  reported falling asleep and feeling tired all the time again she has been is especially being concerned because she is falling asleep when driving. She has tried not to drive alone. She was advised that she is not safe to drive or operate machinery !!  The patient endorsed no cataplectic attacks. She also stated that the Provigil made her feel short of breath. Just yesterday she fell asleep at the dinner table when her sister was her Guest of on her. She was very embarrassed. She goes to bed between 7 and 8 PM and usually is asleep promptly. She will sleep through until 5-5:30 AM.  She will go once to the bathroom at night on average. He may have throat little periods of waking up but usually is able to fall asleep right away again. Her sleep does not feel restorative to her. She has lots of vivid dreams and sometimes dreams of being chased or by the nightmarish content. She has noticed more trouble with her memory but may be fatigue related but could be an independent finding. She feels sometimes clumsy and unsteady when walking. She is upsetting her family since she begun to repeat herself frequently and has been displacing things. She has more trouble with spelling.  I would like to add that I was able to review her sleep studies here  in person and an echo- cardiogram performed on 06-16-13 with a normal ejection fraction of 65% at grade 1 diastolic dysfunction and mild L a dilated patient. They have been previously no focal neurologic deficits noted.   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  She frequenlty fell asleep in school. She was napping daily after school. Life long complaint of excessive daytime  Sleepiness.  She had a sleep study at the [facility]. Dr . Halford Chessman has followed her.   Interval history from 04-27-15    Chief complaint  according to patient : Kathryn Hendricks is here today to discuss the results of an MRI study obtained earlier this summer. She received a call that there were no acute findings but she would like to know what exactly the findings mean. She's also endorsing still a very high degree of daytime sleepiness, 20-24 points . Within our session today we obtained a Mini-Mental Status Examination .  Kathryn Hendricks's MRI of the brain from 01-27-15 documented an underdeveloped splenium of the corpus callosum, a thin lipoma and periventricular FLAIR hyperintensities which we present most likely developmental changes, and  no acute findings were seen. This MRI could not be compared to previous MRIs as none were available. Kathryn Hendricks's question if her learning disabilities could be related to an abnormality of the splenium have to be answered as a cautious possibility, this would not explain her hypersomnia and a discussed with her that her excessive daytime sleepiness is not related to the changes in the brain as noted. Her cognitive difficulties have been with her all her life, and a Mini-Mental Status Examination today to exclude the attention and calculation part. The patient scored 25 out of 30 points. This has been a stable finding she was also able to copy an image, she was able to draw a clock face and she named 14 animals in a fluency test.  Sleep habits are as follows: She reports falling asleep in public places, for example in a restaurant in the waiting room etc. she's not able safely to operate machinery she's not able to safely drive because of her excessive daytime sleepiness. She has been prescribed modafinil as well as a stimulants which have given her a little bit more energy but not having a lasting effect. I would be hesitant to consider this patient a good candidate for Xyrem.  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 The  patient works 8 to 16.30 .  Review of Systems: Out of a complete 14 system review, the patient complains of only the following symptoms, and all other reviewed systems are negative.  Epworth score 20 , Fatigue severity score n/a   , depression score elevated , menopausal symptoms, caretaker burden, grieving.    Social History   Social History  . Marital Status: Divorced    Spouse Name: N/A  . Number of Children: 3  . Years of Education: N/A   Occupational History  . [employer] History Main Topics  . Smoking status: Never Smoker   . Smokeless tobacco: Never Used  . Alcohol Use: No  . Drug Use: No  . Sexual Activity: No   Other Topics Concern  . Not on file   Social History Narrative  Caffeine 1 cup coffee in am.    Family History  Problem Relation Age of Onset  . Hypertension Mother   . Diabetes Mother   . Stroke Mother   . Hypertension Sister   . Hypertension Brother   . Cancer Brother     throat  . Diabetes Father   . Heart disease Father   . CAD Mother 43  . Narcolepsy Mother     not diagnosed    Past Medical History  Diagnosis Date  . Hypertension   . Hyperlipidemia   . Abnormal Pap smear 2011  . Heart failure     Past Surgical History  Procedure Laterality Date  . Appendectomy    . Tubal ligation    . Hysteroscopy w/ endometrial ablation  2010  . Dilation and curettage of uterus  2010  . Wisdom tooth extraction    . Colonoscopy      Current Outpatient Prescriptions  Medication Sig Dispense Refill  . albuterol (PROVENTIL HFA;VENTOLIN HFA) 108 (90 BASE) MCG/ACT inhaler Inhale 2 puffs into the lungs 4 (four) times daily. 1 Inhaler 0  . aspirin 81 MG EC tablet Take 1 tablet (81 mg total) by mouth daily. 90 tablet 1  . diclofenac sodium (VOLTAREN) 1 % GEL Apply 1 application topically 4 (four) times daily as needed. 1 gram to affected area for pain    . lisinopril (PRINIVIL,ZESTRIL) 2.5 MG tablet TAKE 1 TABLET BY MOUTH  ONCE DAILY 90 tablet 1  . loratadine (CLARITIN) 10 MG tablet Take 10 mg by mouth daily.      Marland Kitchen omeprazole (PRILOSEC) 40 MG capsule Take 1 capsule (40 mg total) by mouth daily. 90 capsule 1  . potassium chloride (K-DUR) 10 MEQ tablet Take 1 tablet (10 mEq total) by mouth daily. 90 tablet 1  . simvastatin (ZOCOR) 40 MG tablet TAKE 1 TABLET BY MOUTH ONCE DAILY 90 tablet 2  . amphetamine-dextroamphetamine (ADDERALL XR) 15 MG 24 hr capsule Take 1 capsule by mouth every morning. (Patient not taking: Reported on 04/27/2015) 30 capsule 0  . Sodium Oxybate 500 MG/ML SOLN Begin at suggested started dose Xyrem twice nightly. Liquid. (Patient not taking: Reported on 04/27/2015) 270 mL 0   No current facility-administered medications for this visit.    Allergies as of 04/27/2015 - Review Complete 04/27/2015  Allergen Reaction Noted  . Penicillins Shortness Of Breath, Itching, and Swelling     Vitals: BP 122/80 mmHg  Pulse 78  Resp 20  Ht 5\' 4"  (1.626 m)  Wt 163 lb (73.936 kg)  BMI 27.97 kg/m2 Last Weight:  Wt Readings from Last 1 Encounters:  04/27/15 163 lb (73.936 kg)   STM:HDQQ mass index is 27.97 kg/(m^2).     Last Height:   Ht Readings from Last 1 Encounters:  04/27/15 5\' 4"  (1.626 m)    Physical exam:  General: The patient is awake, alert and appears not in acute distress. The patient is well groomed. Head: Normocephalic, atraumatic. Neck is supple. Mallampati 3,  neck circumference: 13.5 . Nasal airflow restricted , Cardiovascular:  Regular rate and rhythm , without  murmurs or carotid bruit, and without distended neck veins. Respiratory: Lungs are clear to auscultation. Skin:  Without evidence of edema, or rash Trunk: BMI 28.  Neurologic exam : The patient is awake and alert, oriented to place and time.   Memory subjective  described as intact.   MOCA:No flowsheet data found. MMSE: MMSE - Mini Mental State Exam 04/27/2015  Orientation to time  5  Orientation to Place 5    Registration 3  Attention/ Calculation 3  Recall 2  Language- name 2 objects 2  Language- repeat 0  Language- follow 3 step command 3  Language- read & follow direction 1  Write a sentence 1  Copy design 0  Total score 25       Attention span & concentration ability appears normal.  Speech is fluent,  The patient is tearful.Mood and affect are depressed   Cranial nerves: Pupils are equal and briskly reactive to light.  Visual fields by finger perimetry are intact. Hearing to finger rub intact.   Facial sensation intact to fine touch.  Facial motor strength is symmetric and tongue and uvula move midline. Shoulder shrug was symmetrical.   Motor exam:  Normal tone, muscle bulk and symmetric strength in all extremities.  Sensory:  Fine touch, pinprick and vibration were tested in all extremities.   Coordination: Rapid alternating movements in the fingers/hands was normal. Finger-to-nose maneuver  normal without evidence of ataxia, dysmetria or tremor.  Gait and station: Patient walks without assistive device and is able unassisted to climb up to the exam table. Strength within normal limits.  Stance is stable and normal.  Toe and hell stand were tested and are normal .Tandem gait is unfragmented. Turns with 3 Steps. .  Deep tendon reflexes: in the  upper and lower extremities are symmetric and intact. Babinski maneuver response is downgoing.  The patient was advised of the nature of the diagnosed sleep disorder , the treatment options and risks for general a health and wellness arising from not treating the condition.  I spent more than 25 minutes of face to face time with the patient. Greater than 50% of time was spent in counseling and coordination of care. We have discussed the diagnosis and differential and I answered the patient's questions.    FINDINGS: On sagittal images, the spinal cord is imaged caudally to C4C5 and is normal in caliber. T1 and FLAIR Hyperintense signal  is noted around the splenium and posterior genu of the corpus callosum, consistent with a pericallosal lipoma. The splenium of the corpus callosum appears to be under developed. The contents of the posterior fossa are of normal size and position.  The pituitary gland and optic chiasm appear normal. Brain volume appears normal. The ventricles are normal in size and without distortion. There are no abnormal extra-axial collections of fluid.   The cerebellum and brainstem appears normal. The deep gray matter appears normal. There is hyperintense signal on the FLAIR images adjacent to the posterior horns of the lateral ventricles. Elsewhere the hemispheres have normal signal.. The orbits appear normal. The VIIth/VIIIth nerve complex appears normal. The mastoid air cells appear normal. The paranasal sinuses appear normal. Flow voids are identified within the major intracerebral arteries.   Diffusion weighted images are normal. Gradient echo heme weighted images are normal.    IMPRESSION: This is an abnormal MRI of the brain with and without contrast showing the following: 1. 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 2. 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 Periventricular flair hyperintensity posteriorly is possibly related to the developmental changes noted above but could also represent small vessel ischemic changes or demyelination. 4. There are no acute findings.   Assessment:  After physical and neurologic examination, review of laboratory studies,  Personal review of imaging studies, reports of other /same  Imaging studies ,  Results of polysomnography/ neurophysiology testing and pre-existing records as far as provided in visit., my assessment is   1) Mrs. Spielberg has a learning disability that may be related to the findings of a hypoplastic corpus callosum recently discovered in an  MRI from 01-27-15. We have discussed these results over the phone they do not constitute a treatable disorder there are a developmental finding. I demonstrated the MRI images to the patient to illustrate our findings. The Mini-Mental Status Examination revealed 25 out of 30 points.  2) I will refill a stimulant for this patient. I realize that part of her hypersomnia could be her caregiver burden and agrees and difficult situation with her younger sister dying of cancer. However her excessive daytime sleepiness has been documented to be related to the disorder of narcolepsy see above MS LT results.  3) Rv prn .  If her primary care physician feels comfortable to prescribe stimulants she would not need to follow-up in the neurology office. I'm happy however to see her every 6 months alternating with my nurse practitioner to provide refills is that  is Dr. Macario Golds  preference.        Asencion Partridge Leroy Pettway MD  04/27/2015   CC: Kinnie Feil, Md [address] [place], [place] [address]

## 2015-04-27 NOTE — Patient Instructions (Signed)
Amphetamine; Dextroamphetamine extended-release capsules What is this medicine? AMPHETAMINE; DEXTROAMPHETAMINE (am FET a meen; dex troe am FET a meen) is used to treat attention-deficit hyperactivity disorder (ADHD). Federal law prohibits giving this medicine to any person other than the person for whom it was prescribed. Do not share this medicine with anyone else. This medicine may be used for other purposes; ask your health care provider or pharmacist if you have questions. COMMON BRAND NAME(S): Adderall XR What should I tell my health care provider before I take this medicine? They need to know if you have any of these conditions: -anxiety or panic attacks -circulation problems in fingers and toes -glaucoma -hardening or blockages of the arteries or heart blood vessels -heart disease or a heart defect -high blood pressure -history of a drug or alcohol abuse problem -history of stroke -kidney disease -liver disease -mental illness -seizures -suicidal thoughts, plans, or attempt; a previous suicide attempt by you or a family member -thyroid disease -Tourette's syndrome -an unusual or allergic reaction to dextroamphetamine, other amphetamines, other medicines, foods, dyes, or preservatives -pregnant or trying to get pregnant -breast-feeding How should I use this medicine? Take this medicine by mouth with a glass of water. Follow the directions on the prescription label. This medicine is taken just one time per day, usually in the morning after waking up. Take with or without food. Do not chew or crush this medicine. You may open the capsules and sprinkle the medicine onto a spoon of applesauce. If sprinkled on applesauce, take the dose immediately and do not crush or chew. Always drink a glass of water or other liquid after taking this medicine. Do not take your medicine more often than directed. A special MedGuide will be given to you by the pharmacist with each prescription and refill.  Be sure to read this information carefully each time. Talk to your pediatrician regarding the use of this medicine in children. While this drug may be prescribed for children as young as 6 years for selected conditions, precautions do apply. Overdosage: If you think you have taken too much of this medicine contact a poison control center or emergency room at once. NOTE: This medicine is only for you. Do not share this medicine with others. What if I miss a dose? If you miss a dose, take it as soon as you can. If it is almost time for your next dose, take only that dose. Do not take double or extra doses. What may interact with this medicine? Do not take this medicine with any of the following medications: -certain medicines for migraine headache like almotriptan, eletriptan, frovatriptan, naratriptan, rizatriptan, sumatriptan, zolmitriptan -MAOIs like Carbex, Eldepryl, Marplan, Nardil, and Parnate -meperidine -other stimulant medicines for attention disorders, weight loss, or to stay awake -pimozide -procarbazine This medicine may also interact with the following medications: -acetazolamide -ammonium chloride -antacids -ascorbic acid -atomoxetine -caffeine -certain medicines for blood pressure -certain medicines for depression, anxiety, or psychotic disturbances -certain medicines for diabetes -certain medicines for seizures like carbamazepine, phenobarbital, phenytoin -certain medicines for stomach problems like cimetidine, famotidine, omeprazole, lansoprazole -cold or allergy medicines -glutamic acid -methenamine -narcotic medicines for pain -norepinephrine -phenothiazines like chlorpromazine, mesoridazine, prochlorperazine, thioridazine -sodium acid phosphate -sodium bicarbonate This list may not describe all possible interactions. Give your health care provider a list of all the medicines, herbs, non-prescription drugs, or dietary supplements you use. Also tell them if you  smoke, drink alcohol, or use illegal drugs. Some items may interact with your medicine. What should   I watch for while using this medicine? Visit your doctor or health care professional for regular checks on your progress. This prescription requires that you follow special procedures with your doctor and pharmacy. You will need to have a new written prescription from your doctor every time you need a refill. This medicine may affect your concentration, or hide signs of tiredness. Until you know how this medicine affects you, do not drive, ride a bicycle, use machinery, or do anything that needs mental alertness. Tell your doctor or health care professional if this medicine loses its effects, or if you feel you need to take more than the prescribed amount. Do not change the dosage without talking to your doctor or health care professional. Decreased appetite is a common side effect when starting this medicine. Eating small, frequent meals or snacks can help. Talk to your doctor if you continue to have poor eating habits. Height and weight growth of a child taking this medicine will be monitored closely. Do not take this medicine close to bedtime. It may prevent you from sleeping. If you are going to need surgery, an MRI, a CT scan, or other procedure, tell your doctor that you are taking this medicine. You may need to stop taking this medicine before the procedure. Tell your doctor or healthcare professional right away if you notice unexplained wounds on your fingers and toes while taking this medicine. You should also tell your healthcare provider if you experience numbness or pain, changes in the skin color, or sensitivity to temperature in your fingers or toes. What side effects may I notice from receiving this medicine? Side effects that you should report to your doctor or health care professional as soon as possible: -allergic reactions like skin rash, itching or hives, swelling of the face, lips, or  tongue -changes in vision -chest pain or chest tightness -confusion, trouble speaking or understanding -fast, irregular heartbeat -fingers or toes feel numb, cool, painful -hallucination, loss of contact with reality -high blood pressure -males: prolonged or painful erection -seizures -severe headaches -shortness of breath -suicidal thoughts or other mood changes -trouble walking, dizziness, loss of balance or coordination -uncontrollable head, mouth, neck, arm, or leg movements Side effects that usually do not require medical attention (report to your doctor or health care professional if they continue or are bothersome): -anxious -headache -loss of appetite -nausea, vomiting -trouble sleeping -weight loss This list may not describe all possible side effects. Call your doctor for medical advice about side effects. You may report side effects to FDA at 1-800-FDA-1088. Where should I keep my medicine? Keep out of the reach of children. This medicine can be abused. Keep your medicine in a safe place to protect it from theft. Do not share this medicine with anyone. Selling or giving away this medicine is dangerous and against the law. Store at room temperature between 15 and 30 degrees C (59 and 86 degrees F). Keep container tightly closed. Protect from light. Throw away any unused medicine after the expiration date. NOTE: This sheet is a summary. It may not cover all possible information. If you have questions about this medicine, talk to your doctor, pharmacist, or health care provider.  2015, Elsevier/Gold Standard. (2013-06-26 18:16:00)  

## 2015-04-27 NOTE — Telephone Encounter (Signed)
Dr. Brett Fairy said we cannot fill out FMLA paper work for this pt. She does not see the pt for anything that should be used for FMLA. If pt wants FMLA while she takes care of her ailing sister, the physician that is taking care of pt's sister should fill out the [facility] paperwork.  FMLA paperwork given back to MR for the pt to be contacted and refund given.

## 2015-04-27 NOTE — Telephone Encounter (Signed)
Form,[employer] received from [facility] sent to [facility] and Dr Brett Fairy 04/27/15.

## 2015-04-28 DIAGNOSIS — Z0289 Encounter for other administrative examinations: Secondary | ICD-10-CM

## 2015-04-28 NOTE — Telephone Encounter (Signed)
Patient would like for nurse to call her back regarding FMLA

## 2015-04-28 NOTE — Telephone Encounter (Signed)
Return form Fmla back to [name] and Dr Brett Fairy 04/28/15.

## 2015-04-28 NOTE — Telephone Encounter (Signed)
Spoke to Dr. Brett Fairy who agreed to fill out the FMLA paperwork for only her narcolepsy, not for taking care of her ill sister, but it may take up to 14 days to complete and to call pt and let her know. I called pt and informed her. Pt verbalized understanding. I advised her that she would get a phone call when the paperwork was ready.

## 2015-05-03 ENCOUNTER — Telehealth: Payer: Self-pay | Admitting: Neurology

## 2015-05-03 DIAGNOSIS — G47419 Narcolepsy without cataplexy: Secondary | ICD-10-CM

## 2015-05-03 MED ORDER — AMPHETAMINE-DEXTROAMPHETAMINE 10 MG PO TABS: 10.0000 mg | ORAL_TABLET | Freq: Every day | ORAL | Status: DC

## 2015-05-03 NOTE — Telephone Encounter (Signed)
Placed Rx up front for pick up. Spoke to patient, she will be by today to pick the Rx up.

## 2015-05-03 NOTE — Telephone Encounter (Signed)
Patient called, "medication amphetamine-dextroamphetamine (ADDERALL XR) 30 MG 24 hr capsule that Dr. Brett Fairy put me on last Wednesday is not agreeing with me." "It's keeping me awake at night. I woke up with my head killing me, bad headache like a migraine. SOB an tired. Still falling asleep during the daytime. I couldn't go to work today"

## 2015-05-03 NOTE — Telephone Encounter (Signed)
Joy, Please call.   Extended release adderall can cause insomnia.  I will change it to  Adderall IR form 10 mg up to 2 a day. Not to be taken later than 12.00 noon.  She needs to pick the prescription up   . CD

## 2015-05-09 NOTE — Telephone Encounter (Signed)
Completed FMLA paperwork. Dr. Brett Fairy said that pt does not need days off of work from her narcolepsy. She should be allowed one power nap during her working hours 30 min to 1 hour. FMLA paperwork filled out to reflect this recommendation.  Sent to MR.

## 2015-05-10 ENCOUNTER — Telehealth: Payer: Self-pay | Admitting: *Deleted

## 2015-05-10 NOTE — Telephone Encounter (Signed)
Form,[employer] received,completed by Dr Brett Fairy and Cyril Mourning faxed 05/10/15.

## 2015-05-11 ENCOUNTER — Telehealth: Payer: Self-pay | Admitting: Pulmonary Disease

## 2015-05-11 NOTE — Telephone Encounter (Signed)
Spoke to pt. Pt says that her work told her that they would not allow the 30 min-1 hr power nap during working hours as instructed by Dr. Brett Fairy in the [facility] paperwork. We must give the patient 8 hours/day for a 1-2 days a month to have off if pt feels fatigued, or the pt cannot continue working.  Pt is asking for 1-2 days off a month for FMLA if she is tired and wants this filled out on her FMLA. I advised pt that Dr. Brett Fairy filled out the [facility] paperwork to allow her 30-60 min nap only during working days.  I advised pt that I would speak to Dr. Brett Fairy again, but Dr.Dohmeier still may or may not agree to give her the requested amount of time off on her FMLA. I advised pt that either I or Dr. Brett Fairy will call her back.

## 2015-05-11 NOTE — Telephone Encounter (Signed)
Called and spoke with pt Pt very upset stating that her FMLA paperwork was filled out wrong by Dr Doehmier's office Pt states that it was put for pt to only sleep 1 hour on the job and should have been 4 hours per conversation she had with Dr Halford Chessman Pt states that she recently missed time from work x 4 days due to medication that neurologist put her on did not work and is now in jeopardy in losing her job due inaccurate information on FMLA paperwork  Pt stated that her job is giving her a chance to fix paperwork or the will terminate her due to too many absences Pt stated that medical records from her job are going to fax FMLA paperwork to Dr Halford Chessman to correct information   Dr Halford Chessman, are you ok with correcting pt's FMLA forms? Please advise

## 2015-05-11 NOTE — Telephone Encounter (Signed)
Patient is calling to discuss paperwork that was faxed to [employer]. The patient has a question about the coverage. Please call and discuss. Thank you.

## 2015-05-12 ENCOUNTER — Telehealth: Payer: Self-pay

## 2015-05-12 NOTE — Telephone Encounter (Signed)
Called pt to advise her that Dr. Brett Fairy is not in the office again today. Therefore, I am unable to ask Dr. Brett Fairy if she is willing to change the FMLA paperwork to reflect that pt may have 1-2 days off of work a month if pt feels fatigued. I was wondering how pt would like to proceed. Would she like [place] to mail a letter to her work asking for an extension on her FMLA paperwork until Dr. Brett Fairy is back in the office and can discuss it again then?  As documented in the previous phone note, Dr. Brett Fairy only gave pt 30-60 minutes to nap during working hours, but would not allow pt to have days off of work for fatigue.  Left message on pt's voicemail to call us back.

## 2015-05-12 NOTE — Telephone Encounter (Signed)
Spoke with pt, relayed that she needs an ov before paperwork can be filled out.  VS' next available appt is in December.  Pt scheduled with TP next available.   Forwarding to VS as FYI.

## 2015-05-12 NOTE — Telephone Encounter (Signed)
ATC # for pt--line does not ring out. Tried calling 3 more times and still does not ring WCB

## 2015-05-12 NOTE — Telephone Encounter (Signed)
Left pt another message asking her to call me back.

## 2015-05-12 NOTE — Telephone Encounter (Signed)
She would need to have ROV first since several things have transpired since I last saw her.

## 2015-05-12 NOTE — Telephone Encounter (Signed)
Pt is in the lobby

## 2015-05-13 NOTE — Telephone Encounter (Signed)
Noted  

## 2015-05-17 NOTE — Telephone Encounter (Signed)
She can have up to 2 daily nap times per  day , not nap days per week.  I will allow her 2 times 30 minutes for power naps.

## 2015-05-17 NOTE — Telephone Encounter (Signed)
Called Kathryn Hendricks to advise her again that Dr. Brett Fairy is only willing to give 2 power naps a day. She is not willing to give Kathryn Hendricks days off of work. No answer, left a message asking her to call me back. This is the third message left.

## 2015-05-23 ENCOUNTER — Encounter: Payer: Self-pay | Admitting: Adult Health

## 2015-05-23 ENCOUNTER — Ambulatory Visit (INDEPENDENT_AMBULATORY_CARE_PROVIDER_SITE_OTHER): Payer: 59 | Admitting: Adult Health

## 2015-05-23 VITALS — BP 128/84 | HR 71 | Temp 98.3°F | Ht 64.0 in | Wt 152.0 lb

## 2015-05-23 DIAGNOSIS — G47419 Narcolepsy without cataplexy: Secondary | ICD-10-CM | POA: Diagnosis not present

## 2015-05-23 NOTE — Patient Instructions (Signed)
Will fill out FMLA papers, call when are ready for you.  If able try to nap during daytime as discussed  Do not drive if sleepy.  follow up Dr. Halford Chessman  In 6 months and As needed

## 2015-05-26 NOTE — Progress Notes (Signed)
Reviewed and agree with assessment/plan. 

## 2015-05-26 NOTE — Progress Notes (Signed)
Subjective:    Patient ID: Kathryn Hendricks, female    DOB: Jan 01, 1956, 59 y.o.   MRN: [id number]  HPI 59 yo female with narcolepsy w/o cataplexy .   Tests: PSG 06/09/13 >> AHI 3.8, SpO2 low 92% Echo 06/16/13 >> EF 60 to 10%, grade 1 diastolic dysfx, mild/mod LA dilation MSLT 08/04/13 >> mean sleep latency 0.6 min, SOREM in 2/5 naps  05/23/15 Follow up : Narcolepsy  Pt returns for follow up and FMLA papers.  Pt has known narcolepsy w/o cataplexy with daytime sleepiness and fatigue .  She was having some memory issues and was referred to Neuro earlier this year Seen by Dr. Brett Fairy , she underwent a MRI that showed a underdeveloped splenium of the corpus callosum, thin lipoma and periventricular FLAIR hyperintensities possibly r/t developmental changes.  No acute process was noted.  She has a lifelong learning disability which neuro felt could be d/t to the MRI changes as above.   She continues to have daytime sleepiness. She was started on modafinil but did not tolerate. She says she could not Adderall .  If she is busy she does okay but if she sits or gets still she falls asleep.  She has FMLA paperwork . She was recommended to take brief daytime naps but says her job does not allow this. We discussed when she is off , she should schedule daytime naps.   We discussed safety issues with daytime sleepiness.     Review of Systems Constitutional:   No  weight loss, night sweats,  Fevers, chills, +fatigue, or  lassitude.  HEENT:   No headaches,  Difficulty swallowing,  Tooth/dental problems, or  Sore throat,                No sneezing, itching, ear ache, nasal congestion, post nasal drip,   CV:  No chest pain,  Orthopnea, PND, swelling in lower extremities, anasarca, dizziness, palpitations, syncope.   GI  No heartburn, indigestion, abdominal pain, nausea, vomiting, diarrhea, change in bowel habits, loss of appetite, bloody stools.   Resp: No shortness of breath with exertion or at  rest.  No excess mucus, no productive cough,  No non-productive cough,  No coughing up of blood.  No change in color of mucus.  No wheezing.  No chest wall deformity  Skin: no rash or lesions.  GU: no dysuria, change in color of urine, no urgency or frequency.  No flank pain, no hematuria   MS:  No joint pain or swelling.  No decreased range of motion.  No back pain.  Psych:  No change in mood or affect. No depression or anxiety.  No memory loss.         Objective:   Physical Exam  GEN: A/Ox3; pleasant , NAD, well nourished   HEENT:  [place]/AT,  EACs-clear, TMs-wnl, NOSE-clear, THROAT-clear, no lesions, no postnasal drip or exudate noted.   NECK:  Supple w/ fair ROM; no JVD; normal carotid impulses w/o bruits; no thyromegaly or nodules palpated; no lymphadenopathy.  RESP  Clear  P & A; w/o, wheezes/ rales/ or rhonchi.no accessory muscle use, no dullness to percussion  CARD:  RRR, no m/r/g  , no peripheral edema, pulses intact, no cyanosis or clubbing.  GI:   Soft & nt; nml bowel sounds; no organomegaly or masses detected.  Musco: Warm bil, no deformities or joint swelling noted.   Neuro: alert, no focal deficits noted.    Skin: Warm, no lesions or rashes  Assessment & Plan:

## 2015-05-26 NOTE — Assessment & Plan Note (Signed)
Nacrolepsy without cataplexy  Case discussed with Dr. Halford Chessman  And FMLA papers filled out   Plan  If able try to nap during daytime as discussed  Do not drive if sleepy.  follow up Dr. Halford Chessman  In 6 months and As needed

## 2015-06-21 ENCOUNTER — Encounter: Payer: Self-pay | Admitting: Family Medicine

## 2015-06-21 ENCOUNTER — Ambulatory Visit (INDEPENDENT_AMBULATORY_CARE_PROVIDER_SITE_OTHER): Payer: 59 | Admitting: Family Medicine

## 2015-06-21 VITALS — BP 139/68 | HR 67 | Temp 98.0°F | Ht 64.0 in | Wt 146.3 lb

## 2015-06-21 DIAGNOSIS — F4321 Adjustment disorder with depressed mood: Secondary | ICD-10-CM

## 2015-06-21 DIAGNOSIS — M25531 Pain in right wrist: Secondary | ICD-10-CM | POA: Insufficient documentation

## 2015-06-21 DIAGNOSIS — Z0000 Encounter for general adult medical examination without abnormal findings: Secondary | ICD-10-CM

## 2015-06-21 DIAGNOSIS — I10 Essential (primary) hypertension: Secondary | ICD-10-CM

## 2015-06-21 DIAGNOSIS — Z131 Encounter for screening for diabetes mellitus: Secondary | ICD-10-CM

## 2015-06-21 MED ORDER — NAPROXEN 500 MG PO TBEC: 500.0000 mg | DELAYED_RELEASE_TABLET | Freq: Two times a day (BID) | ORAL | Status: DC

## 2015-06-21 NOTE — Assessment & Plan Note (Signed)
Worry about losing her sister soon to colon cancer. I tried to get her to be seen by the psychologist but he was not available during today's visit at the time. I recommended starting her on anxiolytics but she declined it. She stated she will be fine. I offered for her to come in for counseling next week but she prefers to return in 2 wks., I will see her back then.

## 2015-06-21 NOTE — Assessment & Plan Note (Signed)
Likely arthritic pain. Naproxen prescribed BID. Home wrist exercise instruction given. Will consider xray if no improvement.

## 2015-06-21 NOTE — Patient Instructions (Signed)
Generic Wrist Exercises RANGE OF MOTION (ROM) AND STRETCHING EXERCISES - Wrist Sprain  These exercises may help you when beginning to rehabilitate your injury. Your symptoms may resolve with or without further involvement from your physician, physical therapist, or athletic trainer. While completing these exercises, remember:   Restoring tissue flexibility helps normal motion to return to the joints. This allows healthier, less painful movement and activity.  An effective stretch should be held for at least 30 seconds.  A stretch should never be painful. You should only feel a gentle lengthening or release in the stretched tissue. RANGE OF MOTION - Wrist Flexion, Active-Assisted  Extend your right / left elbow with your palm pointing down.*  Gently pull the back of your hand toward you until you feel a gentle stretch on the top of your forearm.  Hold this position for __________ seconds. Repeat __________ times. Complete this exercise __________ times per day.  *If directed by your physician, physical therapist, or athletic trainer, complete this stretch with your elbow bent rather than extended. RANGE OF MOTION - Wrist Extension, Active-Assisted   Extend your right / left elbow and turn your palm upward.*  Gently pull your palm/fingertips back so your wrist extends and your fingers point more toward the ground.  You should feel a gentle stretch on the inside of your forearm.  Hold this position for __________ seconds. Repeat __________ times. Complete this exercise __________ times per day. *If directed by your physician, physical therapist, or athletic trainer, complete this stretch with your elbow bent, rather than extended. RANGE OF MOTION - Supination, Active   Stand or sit with your elbows at your side. Bend your right / left elbow to 90 degrees.  Turn your palm upward until you feel a gentle stretch on the inside of your forearm.  Hold this position for __________ seconds.  Slowly release and return to the starting position. Repeat __________ times. Complete this stretch __________ times per day.  RANGE OF MOTION - Pronation, Active   Stand or sit with your elbows at your side. Bend your right / left elbow to 90 degrees.  Turn your palm downward until you feel a gentle stretch on the top of your forearm.  Hold this position for __________ seconds. Slowly release and return to the starting position. Repeat __________ times. Complete this stretch __________ times per day.  STRENGTHENING EXERCISES  These exercises may help you when beginning to rehabilitate your injury. They may resolve your symptoms with or without further involvement from your physician, physical therapist, or athletic trainer. While completing these exercises, remember:   Muscles can gain both the endurance and the strength needed for everyday activities through controlled exercises.  Complete these exercises as instructed by your physician, physical therapist, or athletic trainer. Progress the resistance and repetitions only as guided.  You may experience muscle soreness or fatigue, but the pain or discomfort you are trying to eliminate should never worsen during these exercises. If this pain does worsen, stop and make certain you are following the directions exactly. If the pain is still present after adjustments, discontinue the exercise until you can discuss the trouble with your clinician. STRENGTH - Wrist Flexors  Sit with your right / left forearm palm-up and fully supported. Your elbow should be resting below the height of your shoulder. Allow your wrist to extend over the edge of the surface.  Loosely holding a __________ weight or a piece of rubber exercise band/tubing, slowly curl your hand up toward your  forearm.  Hold this position for __________ seconds. Slowly lower the wrist back to the starting position in a controlled manner. Repeat __________ times. Complete this exercise  __________ times per day.  STRENGTH - Wrist Extensors  Sit with your right / left forearm palm-down and fully supported. Your elbow should be resting below the height of your shoulder. Allow your wrist to extend over the edge of the surface.  Loosely holding a __________ weight or a piece of rubber exercise band/tubing, slowly curl your hand up toward your forearm.  Hold this position for __________ seconds. Slowly lower the wrist back to the starting position in a controlled manner. Repeat __________ times. Complete this exercise __________ times per day.  STRENGTH - Forearm Supinators  Sit with your right / left forearm supported on a table, keeping your elbow below shoulder height. Rest your hand over the edge, palm down.  Gently grip a hammer or a soup ladle.  Without moving your elbow, slowly turn your palm and hand upward to a "thumbs-up" position.  Hold this position for __________ seconds. Slowly return to the starting position. Repeat __________ times. Complete this exercise __________ times per day.  STRENGTH - Forearm Pronators   Sit with your right / left forearm supported on a table, keeping your elbow below shoulder height. Rest your hand over the edge, palm up.  Gently grip a hammer or a soup ladle.  Without moving your elbow, slowly turn your palm and hand upward to a "thumbs-up" position.  Hold this position for __________ seconds. Slowly return to the starting position. Repeat __________ times. Complete this exercise __________ times per day.  STRENGTH - Grip  Grasp a tennis ball, a dense sponge, or a large, rolled sock in your hand.  Squeeze as hard as you can without increasing any pain.  Hold this position for __________ seconds. Release your grip slowly. Repeat __________ times. Complete this exercise __________ times per day.    This information is not intended to replace advice given to you by your health care provider. Make sure you discuss any questions  you have with your health care provider.   Document Released: 06/27/2005 Document Revised: 09/03/2014 Document Reviewed: 11/25/2008 Elsevier Interactive Patient Education [employer].

## 2015-06-21 NOTE — Assessment & Plan Note (Signed)
I recommended she come in for PAP with me in 2 wks. She prefers to get it done by her GYN. She agreed to call for an appointment with them soon.

## 2015-06-21 NOTE — Assessment & Plan Note (Signed)
BP well controlled on current dose of Lisinopril. I will continue same.

## 2015-06-21 NOTE — Progress Notes (Signed)
Subjective:     Patient ID: Kathryn Hendricks, female   DOB: [birth year], 57 y.o.   MRN: [id number]  HPI Wrist pain: C/O right wrist pain for 3 wks, 5/10 in severity depending on movement, pain is shooting and aching in nature, a bit puffy, no redness. She is left handed, no injury or trauma. She uses Ibuprofen with some improvement. HTN: She is compliant with her meds, here for follow up. Grief: Patient is anxious and depressed about her sister who is slowly dying from colon cancer. She moved in with her sister 3 months ago to provide her comfort care and she just can't bare the pain her sister is going through now. Last week she went into panic attack episode just thinking about losing her sister. She had been fine since then. HM: She is due for PAP.She stated she normally gets it with her OB/Gyn and will schedule appointment with them soon.  Current Outpatient Prescriptions on File Prior to Visit  Medication Sig Dispense Refill  . albuterol (PROVENTIL HFA;VENTOLIN HFA) 108 (90 BASE) MCG/ACT inhaler Inhale 2 puffs into the lungs 4 (four) times daily. 1 Inhaler 0  . amphetamine-dextroamphetamine (ADDERALL) 10 MG tablet Take 1 tablet (10 mg total) by mouth daily with breakfast. (Patient not taking: Reported on 05/23/2015) 30 tablet 0  . aspirin 81 MG EC tablet Take 1 tablet (81 mg total) by mouth daily. 90 tablet 1  . diclofenac sodium (VOLTAREN) 1 % GEL Apply 1 application topically 4 (four) times daily as needed. 1 gram to affected area for pain    . lisinopril (PRINIVIL,ZESTRIL) 2.5 MG tablet TAKE 1 TABLET BY MOUTH ONCE DAILY 90 tablet 1  . loratadine (CLARITIN) 10 MG tablet Take 10 mg by mouth daily.      Marland Kitchen omeprazole (PRILOSEC) 40 MG capsule Take 1 capsule (40 mg total) by mouth daily. 90 capsule 1  . potassium chloride (K-DUR) 10 MEQ tablet Take 1 tablet (10 mEq total) by mouth daily. (Patient not taking: Reported on 05/23/2015) 90 tablet 1  . simvastatin (ZOCOR) 40 MG tablet TAKE 1 TABLET BY  MOUTH ONCE DAILY 90 tablet 2   No current facility-administered medications on file prior to visit.   Past Medical History  Diagnosis Date  . Hypertension   . Hyperlipidemia   . Abnormal Pap smear 2011  . Heart failure      Review of Systems  Respiratory: Negative.   Cardiovascular: Negative.   Gastrointestinal: Negative.   Musculoskeletal: Positive for joint swelling and arthralgias.  Psychiatric/Behavioral: Negative for suicidal ideas and self-injury. The patient is nervous/anxious.   All other systems reviewed and are negative.      Filed Vitals:   06/21/15 0840  BP: 139/68  Pulse: 67  Temp: 98 F (36.7 C)  TempSrc: Oral  Height: 5\' 4"  (1.626 m)  Weight: 146 lb 5 oz (66.367 kg)    Objective:   Physical Exam  Constitutional: She is oriented to person, place, and time. She appears well-developed. No distress.  Cardiovascular: Normal rate, regular rhythm, normal heart sounds and intact distal pulses.   No murmur heard. Pulmonary/Chest: Effort normal and breath sounds normal. No respiratory distress. She has no wheezes.  Abdominal: Soft. Bowel sounds are normal. She exhibits no distension and no mass. There is no tenderness.  Musculoskeletal:       Right wrist: She exhibits tenderness and swelling. She exhibits normal range of motion and no crepitus.       Left wrist: Normal.  Arms: Neurological: She is alert and oriented to person, place, and time. No cranial nerve deficit.  Psychiatric: Her speech is normal and behavior is normal. Judgment normal. Her mood appears not anxious. Cognition and memory are normal. She exhibits a depressed mood. She expresses no homicidal and no suicidal ideation. She expresses no suicidal plans and no homicidal plans.  Teary intermittently when talking about her sister.  Nursing note and vitals reviewed.      Assessment:     Right wrist pain HTN Grief Health maintenance     Plan:     Check problem list.

## 2015-06-22 ENCOUNTER — Telehealth: Payer: Self-pay | Admitting: Psychology

## 2015-06-22 NOTE — Telephone Encounter (Signed)
Dr. Gwendlyn Deutscher recommended counseling for this patient.  She is taking care of her sister who is dying from colon cancer.  Patient called and left a VM yesterday.  I called back today and discussed the situation.  Provided two options - grief counseling through Hospice and scheduling an appointment here.  Ms. Coover says home hospice is involved with her sister.  She was not aware of the free grief counseling they offer and thought that would be reasonable.  She is welcome to be seen here however with hospice already involved, and their expertise in grief, this seemed like a reasonable match.  I asked if I could call the patient back in one to two weeks to ensure she got connected and she would appreciate that.  Will let Dr. Gwendlyn Deutscher know.

## 2015-06-22 NOTE — Telephone Encounter (Signed)
Thanks for following my patient.

## 2015-06-30 ENCOUNTER — Telehealth: Payer: Self-pay | Admitting: Psychology

## 2015-06-30 ENCOUNTER — Other Ambulatory Visit: Payer: Self-pay | Admitting: Family Medicine

## 2015-06-30 MED ORDER — ALPRAZOLAM 0.5 MG PO TBDP: 0.5000 mg | ORAL_TABLET | Freq: Two times a day (BID) | ORAL | Status: DC | PRN

## 2015-06-30 NOTE — Telephone Encounter (Signed)
I called to check on patient following the passing of her sister. She stated she is having worsened panic attack and anxiety such that she has been unable to sleep well since. As discussed with her, I will have her on a short course of Alprazolam to help with insomnia and anxiety. Return precaution discussed. I called in med to pharmacy.

## 2015-06-30 NOTE — Telephone Encounter (Signed)
Called patient to follow-up on referral for grief counseling through Hospice.  She reported her sister died on 12/06/22.  Her funeral is tomorrow.  She has already spoken to Hospice about getting connected after the service.  She has a follow-up scheduled with Dr. Gwendlyn Deutscher.  She says she is "trying to be strong" but it is hard.  Hopefully she will get what she needs from Hospice.  [facility] is available if patient or PCP thinks there is a need.

## 2015-07-07 ENCOUNTER — Telehealth: Payer: Self-pay | Admitting: Family Medicine

## 2015-07-07 NOTE — Telephone Encounter (Signed)
Patient left information about her left shoulder problem ([place]). Please, follow up.

## 2015-07-07 NOTE — Telephone Encounter (Signed)
I spoke with patient. I advised her that her FLMA was completed for her panic but she stated she wanted it for shoulder pain. I have never treated her for this. She stated she got an FMLA completed 2 yrs ago for this by another provider. I will review her record and see her again at follow up for more information.

## 2015-07-07 NOTE — Telephone Encounter (Signed)
Pt called because the FMLA paperwork we filled out and faxed doesn't make sense to the people reviewing her case. She would like to speak to Dr. Gwendlyn Deutscher to discuss this. jw

## 2015-07-08 NOTE — Telephone Encounter (Signed)
Thanks. I will review and complete form on Monday if she came in with a new FMLA form.

## 2015-07-08 NOTE — Telephone Encounter (Signed)
Information placed in provider's box for review. Daena Alper,CMA

## 2015-07-11 ENCOUNTER — Encounter: Payer: Self-pay | Admitting: Family Medicine

## 2015-07-11 DIAGNOSIS — M7522 Bicipital tendinitis, left shoulder: Secondary | ICD-10-CM | POA: Insufficient documentation

## 2015-07-11 NOTE — Telephone Encounter (Signed)
I reviewed letter from [employer] absent and requested a new FMLA form be sent in for completion. I have given letter with the old form to [place].

## 2015-07-12 ENCOUNTER — Other Ambulatory Visit: Payer: Self-pay | Admitting: Family Medicine

## 2015-07-12 ENCOUNTER — Ambulatory Visit (HOSPITAL_COMMUNITY)
Admission: RE | Admit: 2015-07-12 | Discharge: 2015-07-12 | Disposition: A | Discharge status: Home / Self Care | Payer: 59 | Source: Ambulatory Visit | Attending: Family Medicine | Admitting: Family Medicine

## 2015-07-12 ENCOUNTER — Encounter: Payer: Self-pay | Admitting: Family Medicine

## 2015-07-12 ENCOUNTER — Ambulatory Visit (INDEPENDENT_AMBULATORY_CARE_PROVIDER_SITE_OTHER): Payer: 59 | Admitting: Family Medicine

## 2015-07-12 VITALS — BP 132/92 | HR 63 | Temp 97.5°F | Ht 64.0 in | Wt 146.4 lb

## 2015-07-12 DIAGNOSIS — I10 Essential (primary) hypertension: Secondary | ICD-10-CM | POA: Diagnosis not present

## 2015-07-12 DIAGNOSIS — M25531 Pain in right wrist: Secondary | ICD-10-CM

## 2015-07-12 DIAGNOSIS — Z113 Encounter for screening for infections with a predominantly sexual mode of transmission: Secondary | ICD-10-CM | POA: Diagnosis not present

## 2015-07-12 DIAGNOSIS — F4321 Adjustment disorder with depressed mood: Secondary | ICD-10-CM

## 2015-07-12 DIAGNOSIS — M7522 Bicipital tendinitis, left shoulder: Secondary | ICD-10-CM | POA: Diagnosis not present

## 2015-07-12 DIAGNOSIS — F4320 Adjustment disorder, unspecified: Secondary | ICD-10-CM

## 2015-07-12 DIAGNOSIS — Z0000 Encounter for general adult medical examination without abnormal findings: Secondary | ICD-10-CM

## 2015-07-12 NOTE — Progress Notes (Signed)
Dr. Gwendlyn Deutscher requested a [facility].   Presenting Issue:  Grief   Report of symptoms:  Feeling sad, not wanting to be around people, poor appetite and sleep  Duration of CURRENT symptoms:  Since October 29th  Age of onset of first mood disturbance:  In her 20's  Impact on function:  Moderate. Kathryn Hendricks is currently on leave from work at [employer] because she lost her sister to colon cancer on October 29th of this year. She reported that, at home, she is coping "pretty well" with the death of her sister and tries to stay busy by cleaning, shopping, and trying to generally stay busy. 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 Kathryn Hendricks stated that she is afraid that she will be unable to keep herself from crying in front of the patients and is not ready to be around patients that will likely remind her of her recently deceased sister. She also reported having infrequent panic attacks during the days after her sister's death, but reported that her anxiety has significantly decreased and she has not recently had any attacks.  Psychiatric History - Diagnoses: Major Depression - Hospitalizations: In her 77's; VC at [facility] after stating she wanted to hurt herself in reaction to a breakup - Pharmacotherapy: Alprazolam; Adderall for Narcolepsy - Outpatient therapy: None currently  Family history of psychiatric issues:  None reported  Current and history of substance use:  None reported   Assessment / Plan / Recommendations: Kathryn Hendricks is coping relatively well with the loss of her sister but would benefit from a [facility] follow-up. She recognizes that her grief is normal and that the sadness she is experiencing will decrease over time. She will call the hospital to discuss extending her leave for one more week. Since she works every other  Tuesday and Friday, she is unsure when she will be available for a follow-up [facility] appointment and will therefore call [facility] to schedule an appointment with a [facility] when she knows her schedule.

## 2015-07-12 NOTE — Assessment & Plan Note (Signed)
Likely arthritis. Xray ordered today. Continue naprosyn prn pain.

## 2015-07-12 NOTE — Assessment & Plan Note (Signed)
Initially elevated. Likely related to stressful event at home. Repeat BP check by me after few in of rest and relaxation improved. No change in medication for now.

## 2015-07-12 NOTE — Assessment & Plan Note (Signed)
She does well when she takes her alprazolam for anxiety and insomnia. Brief counseling done by me. I offered for her to receive more counseling from our behavioral health specialist today and she agreed. Alex took her in for counseling.

## 2015-07-12 NOTE — Assessment & Plan Note (Addendum)
Diagnosed in 2010, since then she has had recurrent flair up mostly during cold season. She was evaluated by sport medicine and had multiple sessions of PT in the past. For now we will control pain with medication. Naprosyn prn pain. Rest shoulder during flares for a short period of time and then work on home ROM exercise. F/U prn.  NB: I completed her FMLA for for shoulder flare up yesterday.

## 2015-07-12 NOTE — Assessment & Plan Note (Signed)
HIV  And hep C offered for STD screening. She agreed to get tested today.

## 2015-07-12 NOTE — Progress Notes (Signed)
Subjective:     Patient ID: Kathryn Hendricks, female   DOB: 02/12/1956, 59 y.o.   MRN: [id number]  HPI Left shoulder pain: This started in 2010, since then her pain flares up during the winter time. Pain is about 5-6/10  In severity, it can get as worse as 7-8/10 in severity. This may incapacitate her whenever it flares up. She does well with rest and pain meds. She has been seen multiple times by sport medicine. Right wrist pain: On going for 1 month, it is associated with swelling but no redness. Pain is about 5/10 in severity, worse with movement.  HTN: BP elevated today. She did not take her BP meds yet. Grief: She recently lost her on Oct 29th, since then her anxiety and stress level has been up, she keeps thinking about her. This makes her cry a lot. She was supposed to receive counseling from hospice care but they are yet to call her to schedule appointment. Her loss of her sister makes her depressed.  Current Outpatient Prescriptions on File Prior to Visit  Medication Sig Dispense Refill  . albuterol (PROVENTIL HFA;VENTOLIN HFA) 108 (90 BASE) MCG/ACT inhaler Inhale 2 puffs into the lungs 4 (four) times daily. 1 Inhaler 0  . ALPRAZolam (NIRAVAM) 0.5 MG dissolvable tablet Take 1 tablet (0.5 mg total) by mouth 2 (two) times daily as needed for anxiety. 15 tablet 0  . amphetamine-dextroamphetamine (ADDERALL) 10 MG tablet Take 1 tablet (10 mg total) by mouth daily with breakfast. 30 tablet 0  . aspirin 81 MG EC tablet Take 1 tablet (81 mg total) by mouth daily. 90 tablet 1  . diclofenac sodium (VOLTAREN) 1 % GEL Apply 1 application topically 4 (four) times daily as needed. 1 gram to affected area for pain    . lisinopril (PRINIVIL,ZESTRIL) 2.5 MG tablet TAKE 1 TABLET BY MOUTH ONCE DAILY 90 tablet 1  . loratadine (CLARITIN) 10 MG tablet Take 10 mg by mouth daily.      . naproxen (EC NAPROSYN) 500 MG EC tablet Take 1 tablet (500 mg total) by mouth 2 (two) times daily with a meal. 60 tablet 1  .  simvastatin (ZOCOR) 40 MG tablet TAKE 1 TABLET BY MOUTH ONCE DAILY 90 tablet 2  . omeprazole (PRILOSEC) 40 MG capsule Take 1 capsule (40 mg total) by mouth daily. (Patient not taking: Reported on 06/21/2015) 90 capsule 1   No current facility-administered medications on file prior to visit.   Past Medical History  Diagnosis Date  . Hypertension   . Hyperlipidemia   . Abnormal Pap smear 2011  . Heart failure ([place])      Review of Systems  Respiratory: Negative.   Cardiovascular: Negative.   Musculoskeletal: Positive for joint swelling and arthralgias. Negative for gait problem.  Psychiatric/Behavioral: Negative for suicidal ideas. The patient is nervous/anxious.   All other systems reviewed and are negative.  Filed Vitals:   07/12/15 0830 07/12/15 0848  BP: 159/73 132/92  Pulse: 63   Temp: 97.5 F (36.4 C)   TempSrc: Oral   Height: 5\' 4"  (1.626 m)   Weight: 146 lb 7 oz (66.424 kg)        Objective:   Physical Exam  Constitutional: She is oriented to person, place, and time. She appears well-developed. No distress.  Cardiovascular: Normal rate, regular rhythm and normal heart sounds.   No murmur heard. Pulmonary/Chest: Effort normal and breath sounds normal. No respiratory distress. She has no wheezes.  Abdominal: Soft. She exhibits  no distension.  Musculoskeletal: She exhibits no edema.       Left shoulder: Normal.       Right wrist: She exhibits tenderness and swelling. She exhibits normal range of motion and no effusion.  Neurological: She is alert and oriented to person, place, and time.  Psychiatric: Her speech is normal and behavior is normal. Judgment and thought content normal. Her mood appears not anxious. Cognition and memory are normal.  Here mood was a bit down today. She kept tearing up whenever she talks about her sister.  Nursing note and vitals reviewed.      Assessment:     Left shoulder pain: Bicep tendinitis Right wrist pain: Likely  arthritis HTN Grief     Plan:     Check problem list.

## 2015-07-12 NOTE — Patient Instructions (Signed)
Please go to radiology today for your xray, I will call you with result. I will like to see you in 4 wks or sooner if needed.  Complicated Grieving Grief is a normal response to the death of someone close to you. Feelings of fear, anger, and guilt can affect almost everyone who loses a loved one. It is also common to have symptoms of depression while you are grieving. These include problems with sleep, loss of appetite, and lack of energy. They may last for weeks or months after a loss. Complicated grief is different from normal grief or depression. Normal grieving involves sadness and feelings of loss, but these feelings are not constant. Complicated grief is a constant and severe type of grief. It interferes with your ability to function normally. It may last for several months to a year or longer. Complicated grief may require treatment from a mental health care provider. CAUSES  It is not known why some people continue to struggle with grief and others do not. You may be at higher risk for complicated grief if:  The death of your loved one was sudden or unexpected.  The death of your loved one was due to a violent event.  Your loved one committed suicide.  Your loved one was a child or a young person.  You were very close to or dependent on the loved one.  You have a history of depression. SIGNS AND SYMPTOMS Signs and symptoms of complicated grief may include:  Feeling disbelief or numbness.  Being unable to enjoy good memories of your loved one.  Needing to avoid anything that reminds you of your loved one.  Being unable to stop thinking about the death.  Feeling intense anger or guilt.  Feeling alone and hopeless.  Feeling that your life is meaningless and empty.  Losing the desire to live. DIAGNOSIS Your health care provider may diagnose complicated grief if:  You have constant symptoms of grief for 6-12 months or longer.  Your symptoms are interfering with your  ability to live your life. Your health care provider may want you to see a mental health care provider. Many symptoms of depression are similar to the symptoms of complicated grief. It is important to be evaluated for complicated grief along with other mental health conditions. TREATMENT  Talk therapy with a mental health provider is the most common treatment for complicated grief. During therapy, you will learn healthy ways to cope with the loss of your loved one. In some cases, your mental health care provider may also recommend antidepressant medicines. HOME CARE INSTRUCTIONS  Take care of yourself.  Eat regular meals and maintain a healthy diet. Eat plenty of fruits, vegetables, and whole grains.  Try to get some exercise each day.  Keep regular hours for sleep. Try to get at least 8 hours of sleep each night.  Do not use drugs or alcohol to ease your symptoms.  Take medicines only as directed by your health care provider.  Spend time with friends and loved ones.  Consider joining a grief (bereavement) support group to help you deal with your loss.  Keep all follow-up visits as directed by your health care provider. This is important. SEEK MEDICAL CARE IF:  Your symptoms keep you from functioning normally.  Your symptoms do not get better with treatment. SEEK IMMEDIATE MEDICAL CARE IF:  You have serious thoughts of hurting yourself or someone else.  You have suicidal feelings.   This information is not intended to  replace advice given to you by your health care provider. Make sure you discuss any questions you have with your health care provider.   Document Released: 08/13/2005 Document Revised: 05/04/2015 Document Reviewed: 01/21/2014 Elsevier Interactive Patient Education [employer].

## 2015-07-12 NOTE — Telephone Encounter (Signed)
Left voice message for patient needing to know if she would like FMLA forms faxed and will she be picking them up.  Derl Barrow, RN

## 2015-07-13 ENCOUNTER — Telehealth: Payer: Self-pay | Admitting: Family Medicine

## 2015-07-13 ENCOUNTER — Telehealth: Payer: Self-pay | Admitting: *Deleted

## 2015-07-13 ENCOUNTER — Encounter: Payer: Self-pay | Admitting: Family Medicine

## 2015-07-13 LAB — HEPATITIS C ANTIBODY: HCV AB: NEGATIVE

## 2015-07-13 LAB — HIV ANTIBODY (ROUTINE TESTING W REFLEX): HIV 1&2 Ab, 4th Generation: NONREACTIVE

## 2015-07-13 NOTE — Telephone Encounter (Signed)
Patient is needing another letter written that gives the reason for her not going back to work until 07/18/15.  Please fax to Evonnie Dawes [phone]

## 2015-07-13 NOTE — Telephone Encounter (Signed)
-----   Message from Kinnie Feil, MD sent at 07/12/2015  2:50 PM EST ----- Please advise patinet. Her wrist xray looks good. She may continue pain meds for now. We will discuss more at next visit.

## 2015-07-13 NOTE — Telephone Encounter (Signed)
She is needing the letter to have the dates 14 to 21st.

## 2015-07-13 NOTE — Telephone Encounter (Signed)
Will forward to MD. Jazmin Hartsell,CMA  

## 2015-07-13 NOTE — Telephone Encounter (Signed)
Patient is aware of this.  States that she spoke with MD and they discussed possible arthritis. Kathryn Hendricks,CMA

## 2015-07-13 NOTE — Telephone Encounter (Signed)
Letter completed and placed up front for faxing.

## 2015-07-14 NOTE — Telephone Encounter (Addendum)
FMLA paperwork faxed to [employer] [phone]- Attn: Evonnie Dawes) per [facility], ASA.  Original placed at front desk for patient to pick up and copy placed in scan box.  Burna Forts, BSN, RN-BC

## 2015-07-15 ENCOUNTER — Encounter: Payer: Self-pay | Admitting: Obstetrics & Gynecology

## 2015-07-15 ENCOUNTER — Ambulatory Visit (INDEPENDENT_AMBULATORY_CARE_PROVIDER_SITE_OTHER): Payer: 59 | Admitting: Obstetrics & Gynecology

## 2015-07-15 VITALS — BP 134/77 | HR 62 | Temp 98.1°F | Ht 64.0 in | Wt 152.0 lb

## 2015-07-15 DIAGNOSIS — Z0000 Encounter for general adult medical examination without abnormal findings: Secondary | ICD-10-CM

## 2015-07-15 DIAGNOSIS — Z124 Encounter for screening for malignant neoplasm of cervix: Secondary | ICD-10-CM

## 2015-07-15 DIAGNOSIS — Z113 Encounter for screening for infections with a predominantly sexual mode of transmission: Secondary | ICD-10-CM

## 2015-07-15 DIAGNOSIS — Z1151 Encounter for screening for human papillomavirus (HPV): Secondary | ICD-10-CM | POA: Diagnosis not present

## 2015-07-15 DIAGNOSIS — Z01419 Encounter for gynecological examination (general) (routine) without abnormal findings: Secondary | ICD-10-CM | POA: Diagnosis not present

## 2015-07-15 NOTE — Progress Notes (Signed)
Subjective:    Kathryn Hendricks is a 59 y.o.  D AAP3 (6 grands) female who presents for an annual exam. The patient has no complaints today. The patient is not currently sexually active for 3 or 4 years. GYN screening history: last pap: was normal. The patient wears seatbelts: yes. The patient participates in regular exercise: yes. Has the patient ever been transfused or tattooed?: no. The patient reports that there is not domestic violence in her life.   Menstrual History: OB History    Gravida Para Term Preterm AB TAB SAB Ectopic Multiple Living   5 3 2 1 2 2    3       Menarche age: 10  No LMP recorded. Patient is postmenopausal.    The following portions of the patient's history were reviewed and updated as appropriate: allergies, current medications, past family history, past medical history, past social history, past surgical history and problem list.  Review of Systems Pertinent items are noted in HPI.  Works at [employer] in [employer], got her flu vaccine. Colonoscopy UTD and normal. Mammogram 4/16 and normal. She is seeing a counselor for grief after losing her sister to colon cancer.   Objective:    BP 134/77 mmHg  Pulse 62  Temp(Src) 98.1 F (36.7 C) (Oral)  Ht 5\' 4"  (1.626 m)  Wt 152 lb (68.947 kg)  BMI 26.08 kg/m2  General Appearance:    Alert, cooperative, no distress, appears stated age  Head:    Normocephalic, without obvious abnormality, atraumatic  Eyes:    PERRL, conjunctiva/corneas clear, EOM's intact, fundi    benign, both eyes  Ears:    Normal TM's and external ear canals, both ears  Nose:   Nares normal, septum midline, mucosa normal, no drainage    or sinus tenderness  Throat:   Lips, mucosa, and tongue normal; teeth and gums normal  Neck:   Supple, symmetrical, trachea midline, no adenopathy;    thyroid:  no enlargement/tenderness/nodules; no carotid   bruit or JVD  Back:     Symmetric, no curvature, ROM normal, no CVA tenderness  Lungs:     Clear  to auscultation bilaterally, respirations unlabored  Chest Wall:    No tenderness or deformity   Heart:    Regular rate and rhythm, S1 and S2 normal, no murmur, rub   or gallop  Breast Exam:    No tenderness, masses, or nipple abnormality  Abdomen:     Soft, non-tender, bowel sounds active all four quadrants,    no masses, no organomegaly  Genitalia:    Normal female without lesion, discharge or tenderness, atrophic, uterus- ULN size, mobile, no palpable adnexal masses     Extremities:   Extremities normal, atraumatic, no cyanosis or edema  Pulses:   2+ and symmetric all extremities  Skin:   Skin color, texture, turgor normal, no rashes or lesions  Lymph nodes:   Cervical, supraclavicular, and axillary nodes normal  Neurologic:   CNII-XII intact, normal strength, sensation and reflexes    throughout  .    Assessment:    Healthy female exam.    Plan:     Breast self exam technique reviewed and patient encouraged to perform self-exam monthly. Thin prep Pap smear.   CT/GC test per patient request

## 2015-07-18 LAB — GC/CHLAMYDIA PROBE AMP (~~LOC~~) NOT AT ARMC
Chlamydia: NEGATIVE
Neisseria Gonorrhea: NEGATIVE

## 2015-07-18 LAB — CYTOLOGY - PAP

## 2015-08-05 ENCOUNTER — Ambulatory Visit: Payer: 59 | Admitting: Family Medicine

## 2015-08-08 ENCOUNTER — Encounter (HOSPITAL_COMMUNITY): Payer: Self-pay | Admitting: Emergency Medicine

## 2015-08-08 ENCOUNTER — Emergency Department (HOSPITAL_COMMUNITY)
Admission: EM | Admit: 2015-08-08 | Discharge: 2015-08-08 | Disposition: A | Discharge status: Home / Self Care | Payer: 59 | Source: Home / Self Care

## 2015-08-08 DIAGNOSIS — J069 Acute upper respiratory infection, unspecified: Secondary | ICD-10-CM

## 2015-08-08 MED ORDER — AZITHROMYCIN 250 MG PO TABS: 250.0000 mg | ORAL_TABLET | Freq: Every day | ORAL | Status: DC

## 2015-08-08 MED ORDER — BENZONATATE 100 MG PO CAPS: 100.0000 mg | ORAL_CAPSULE | Freq: Three times a day (TID) | ORAL | Status: DC

## 2015-08-08 NOTE — ED Notes (Signed)
Pt here with URI sx's that started 2 weeks ago Cough,nasal congestion, runny nose and yellow phlegm production Denies fever,chills,n,v, chest pain or sob Taking OTC cough/colds meds with some relief

## 2015-08-08 NOTE — ED Provider Notes (Signed)
CSN: [id number]     Arrival date & time 08/08/15  1632 History   None    Chief Complaint  Patient presents with  . URI   (Consider location/radiation/quality/duration/timing/severity/associated sxs/prior Treatment) HPI Cough sputum production for 2 weeks not getting any better or worse at night. 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 Would like to be evaluated to see if this infection. She is a nonsmoker.  Past Medical History  Diagnosis Date  . Hypertension   . Hyperlipidemia   . Abnormal Pap smear 2011  . Heart failure [facility])    Past Surgical History  Procedure Laterality Date  . Appendectomy    . Tubal ligation    . Hysteroscopy w/ endometrial ablation  2010  . Dilation and curettage of uterus  2010  . Wisdom tooth extraction    . Colonoscopy     Family History  Problem Relation Age of Onset  . Hypertension Mother   . Diabetes Mother   . Stroke Mother   . Hypertension Sister   . Hypertension Brother   . Cancer Brother     throat  . Diabetes Father   . Heart disease Father   . CAD Mother 75  . Narcolepsy Mother     not diagnosed   Social History  Substance Use Topics  . Smoking status: Never Smoker   . Smokeless tobacco: Never Used  . Alcohol Use: No   OB History    Gravida Para Term Preterm AB TAB SAB Ectopic Multiple Living   5 3 2 1 2 2    3      Review of Systems Positive for cough, sputum production negative for fever, chills, nausea, vomiting or diarrhea. Allergies  Penicillins  Home Medications   Prior to Admission medications   Medication Sig Start Date End Date Taking? Authorizing Provider  albuterol (PROVENTIL HFA;VENTOLIN HFA) 108 (90 BASE) MCG/ACT inhaler Inhale 2 puffs into the lungs 4 (four) times daily. 09/21/14   Harden Mo, MD  ALPRAZolam (NIRAVAM) 0.5 MG dissolvable tablet Take 1 tablet (0.5 mg total) by mouth 2 (two) times daily as needed for anxiety. 06/30/15   Kinnie Feil, MD  amphetamine-dextroamphetamine (ADDERALL) 10 MG tablet Take 1 tablet (10 mg total) by mouth daily with breakfast. 05/03/15   Larey Seat, MD  aspirin 81 MG EC tablet Take 1 tablet (81 mg total) by mouth daily. 06/22/14   Kinnie Feil, MD  azithromycin (ZITHROMAX) 250 MG tablet Take 1 tablet (250 mg total) by mouth daily. Take first 2 tablets together, then 1 every day until finished. 08/08/15   Konrad Felix, PA  benzonatate (TESSALON) 100 MG capsule Take 1 capsule (100 mg total) by mouth every 8 (eight) hours. 08/08/15   Konrad Felix, PA  diclofenac sodium (VOLTAREN) 1 % GEL Apply 1 application topically 4 (four) times daily as needed. 1 gram to affected area for pain    Historical Provider, MD  lisinopril (PRINIVIL,ZESTRIL) 2.5 MG tablet TAKE 1 TABLET BY MOUTH ONCE DAILY 03/28/15   Kinnie Feil, MD  loratadine (CLARITIN) 10 MG tablet Take 10 mg by mouth daily.      Historical Provider, MD  naproxen (EC NAPROSYN) 500 MG EC tablet Take 1 tablet (500 mg total) by mouth 2 (two) times daily with a meal. 06/21/15   Kinnie Feil, MD  omeprazole (PRILOSEC) 40 MG capsule Take 1 capsule (40 mg total) by  mouth daily. 06/22/14   Kinnie Feil, MD  simvastatin (ZOCOR) 40 MG tablet TAKE 1 TABLET BY MOUTH ONCE DAILY 03/10/15   Kinnie Feil, MD   Meds Ordered and Administered this Visit  Medications - No data to display  BP 147/63 mmHg  Pulse 85  Temp(Src) 98.9 F (37.2 C) (Oral)  SpO2 100% No data found.   Physical Exam  Constitutional: She is oriented to person, place, and time. She appears well-developed and well-nourished.  Eyes: Conjunctivae are normal.  Neck: Normal range of motion. Neck supple.  Cardiovascular: Normal rate.   Pulmonary/Chest: Effort normal and breath sounds normal.  Abdominal: Soft. Bowel sounds are normal.  Musculoskeletal: Normal range of motion.  Neurological: She is alert and oriented to person, place, and time.  Skin: Skin is warm and  dry.  Psychiatric: She has a normal mood and affect. Her behavior is normal. Judgment and thought content normal.  Nursing note and vitals reviewed.   ED Course  Procedures (including critical care time)  Labs Review Labs Reviewed - No data to display  Imaging Review No results found.   Visual Acuity Review  Right Eye Distance:   Left Eye Distance:   Bilateral Distance:    Right Eye Near:   Left Eye Near:    Bilateral Near:         MDM   1. URI (upper respiratory infection)    Prescription for azithromycin and Tessalon Perles. Patient states that she does not require a return to work note. Suggest continue symptomatic treatment at home. Follow up primary medical provider. Instructions of care provided discharged home in stable condition.   THIS NOTE WAS GENERATED USING A VOICE RECOGNITION SOFTWARE PROGRAM. ALL REASONABLE EFFORTS  WERE MADE TO PROOFREAD THIS DOCUMENT FOR ACCURACY.      Konrad Felix, [place] 08/08/15 1754

## 2015-08-08 NOTE — Discharge Instructions (Signed)

## 2015-08-16 ENCOUNTER — Other Ambulatory Visit: Payer: Self-pay | Admitting: Neurology

## 2015-08-16 DIAGNOSIS — G47419 Narcolepsy without cataplexy: Secondary | ICD-10-CM

## 2015-08-16 MED ORDER — AMPHETAMINE-DEXTROAMPHETAMINE 10 MG PO TABS: 10.0000 mg | ORAL_TABLET | Freq: Every day | ORAL | Status: DC

## 2015-08-16 NOTE — Telephone Encounter (Signed)
The patient was originally prescribed Adderall and Xyrem for this condition.  It appears she never started the Xyrem, so it is no longer on med list.  I called back to clarify.  Got no answer.  Left message.   Since Adderall is still active on med list, request entered, forwarded to provider for review.

## 2015-08-16 NOTE — Telephone Encounter (Signed)
Spoke to pt and advised her that her RX for adderall is ready for pick up at the front desk. Pt verbalized understanding.  

## 2015-08-16 NOTE — Telephone Encounter (Signed)
Pt called and is requesting a refill on her narcolepsy medication. She did not know the name though. Can you help? Thank you

## 2015-08-16 NOTE — Telephone Encounter (Signed)
Pt came by office to pick up her adderall RX. Front desk advised pt that we have 24 hours to answer a refill request. Pt just called about this at 10:16 this morning.   Pt had an RX for adderall 15 mg from April 2016 signed by Dr. Jaynee Eagles still in the sign out book at the front desk. This RX was shredded. Her RX has changed to adderall 10 mg.

## 2015-09-08 MED FILL — AMPHETAMINE SALTS 10 MG TAB: 10 | 30 days supply | Qty: 30 | Fill #0

## 2015-10-19 ENCOUNTER — Other Ambulatory Visit: Payer: Self-pay | Admitting: Family Medicine

## 2015-10-19 NOTE — Telephone Encounter (Signed)
Needs refill on lisionpril. Took her last pill on Monday.  [facility] pharmacy

## 2015-10-20 ENCOUNTER — Other Ambulatory Visit: Payer: Self-pay

## 2015-10-20 ENCOUNTER — Other Ambulatory Visit: Payer: Self-pay | Admitting: Neurology

## 2015-10-20 DIAGNOSIS — G47419 Narcolepsy without cataplexy: Secondary | ICD-10-CM

## 2015-10-20 MED ORDER — AMPHETAMINE-DEXTROAMPHETAMINE 10 MG PO TABS: 10.0000 mg | ORAL_TABLET | Freq: Every day | ORAL | Status: DC

## 2015-10-20 MED ORDER — LISINOPRIL 2.5 MG PO TABS: 2.5000 mg | ORAL_TABLET | Freq: Every day | ORAL | Status: DC

## 2015-10-20 MED FILL — LISINOPRIL 2.5 MG TABLET: 2.5 | 90 days supply | Qty: 90 | Fill #0

## 2015-10-20 NOTE — Telephone Encounter (Signed)
Rn request medication for  Adderall. Rn explain the medication is a schedule two drug and she needs an appt. Pt was last seen by Dr.Dohmeier in August 2016. Rn offer patient a 30 day refill an no refills until she comes for appt. Pt stated she works for [employer] and has points and needs to tell give her job advance notice. Rn explain the medication will printout and she has to pick it up and show valid ID and sign it out. Rn explain that [place] close at 1200pm on Thursday. PT requested it be left at the front.. Rn explain that [place] will be close, and she will have to come during business hours. Pt states she works till 0400pm. Pt stated she will come get it next week. Pt schedule with Dorina Hoyer for November 03, 2015 for 0800am told to bring co payment and insurance card.Pt was told to arrive at 0730 for check in.

## 2015-10-20 NOTE — Telephone Encounter (Signed)
Rn request medication for Adderall. Rn explain the medication is a schedule two drug and she needs an appt. Pt was last seen by Dr.Dohmeier in August 2016. Rn offer patient a 30 day refill an no refills until she comes for appt. Pt stated she works for [employer] and has points and needs to tell give her job advance notice. Rn explain the medication will printout and she has to pick it up and show valid ID and sign it out. Rn explain that [place] close at 1200pm on Thursday. PT requested it be left at the front.. Rn explain that [place] will be close, and she will have to come during business hours. Pt states she works till 0400pm. Pt stated she will come get it next week. Pt schedule with Dorina Hoyer for November 03, 2015 for 0800am told to bring co payment and insurance card.Pt was told to arrive at 0730 for check in.

## 2015-10-20 NOTE — Telephone Encounter (Signed)
Patient needs a refill on Adderall 10 MG. The best number to contact the patient is [phone]

## 2015-10-21 ENCOUNTER — Telehealth: Payer: Self-pay | Admitting: *Deleted

## 2015-10-21 NOTE — Telephone Encounter (Signed)
LMVM for pt on her mobile that adderall prescription ready for pick up.  Gave her hours today Friday 8-12, M-Thu 8-5.

## 2015-10-25 ENCOUNTER — Telehealth: Payer: Self-pay | Admitting: Neurology

## 2015-10-25 ENCOUNTER — Other Ambulatory Visit: Payer: Self-pay

## 2015-10-25 NOTE — Telephone Encounter (Signed)
Patient doesn't want to have another sleep and wants to know why she needs another test?

## 2015-10-25 NOTE — Telephone Encounter (Signed)
Order entered in error.

## 2015-10-25 NOTE — Telephone Encounter (Signed)
error 

## 2015-10-27 MED FILL — AMPHETAMINE SALTS 10 MG TAB: 10 | 30 days supply | Qty: 30 | Fill #0

## 2015-11-03 ENCOUNTER — Encounter: Payer: Self-pay | Admitting: Adult Health

## 2015-11-03 ENCOUNTER — Ambulatory Visit (INDEPENDENT_AMBULATORY_CARE_PROVIDER_SITE_OTHER): Payer: 59 | Admitting: Adult Health

## 2015-11-03 VITALS — BP 145/85 | HR 61 | Ht 64.0 in | Wt 151.0 lb

## 2015-11-03 DIAGNOSIS — F909 Attention-deficit hyperactivity disorder, unspecified type: Secondary | ICD-10-CM | POA: Diagnosis not present

## 2015-11-03 DIAGNOSIS — G47419 Narcolepsy without cataplexy: Secondary | ICD-10-CM | POA: Diagnosis not present

## 2015-11-03 DIAGNOSIS — R413 Other amnesia: Secondary | ICD-10-CM | POA: Diagnosis not present

## 2015-11-03 DIAGNOSIS — F988 Other specified behavioral and emotional disorders with onset usually occurring in childhood and adolescence: Secondary | ICD-10-CM

## 2015-11-03 NOTE — Patient Instructions (Addendum)
Continue Adderall Avoid long distance driving  If depression worsens or feeling angry we may have to stop the medication  If your symptoms worsen or you develop new symptoms please let us know.
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
## 2015-11-04 ENCOUNTER — Telehealth: Payer: Self-pay | Admitting: Family Medicine

## 2015-11-04 NOTE — Telephone Encounter (Signed)
Please advise patient she can get Aspercreme OTC. I am not sure what procedure she is having. If it is non-medical I will not be giving prescription for it.

## 2015-11-04 NOTE — Telephone Encounter (Signed)
Will forward to MD. Hubbert Landrigan,CMA  

## 2015-11-04 NOTE — Telephone Encounter (Signed)
Pt is calling because she is having a touch up on her Micro Blade eyebrows. She was told to get a cream Emla to put on this before the procedure since it will numb the area. They use a blade cut and tattoo the eyebrows. Please call this in to the [place]. jw

## 2015-11-07 NOTE — Telephone Encounter (Signed)
LM for patient ok per DPR signed in chart and also identified VM with message from MD.  Kathryn Hendricks

## 2015-11-08 ENCOUNTER — Ambulatory Visit (INDEPENDENT_AMBULATORY_CARE_PROVIDER_SITE_OTHER): Payer: 59 | Admitting: Family Medicine

## 2015-11-08 ENCOUNTER — Encounter: Payer: Self-pay | Admitting: Family Medicine

## 2015-11-08 VITALS — BP 136/74 | HR 66 | Temp 98.4°F | Ht 64.0 in | Wt 151.0 lb

## 2015-11-08 DIAGNOSIS — R1032 Left lower quadrant pain: Secondary | ICD-10-CM

## 2015-11-08 DIAGNOSIS — F4321 Adjustment disorder with depressed mood: Secondary | ICD-10-CM | POA: Diagnosis not present

## 2015-11-08 DIAGNOSIS — M25531 Pain in right wrist: Secondary | ICD-10-CM | POA: Diagnosis not present

## 2015-11-08 HISTORY — DX: Left lower quadrant pain: R10.32

## 2015-11-08 MED ORDER — LIDOCAINE-PRILOCAINE 2.5-2.5 % EX CREA: 1.0000 "application " | TOPICAL_CREAM | CUTANEOUS | Status: DC | PRN

## 2015-11-08 MED FILL — LIDOCAINE-PRILOCAINE CREAM: 2.5-2.5 | 15 days supply | Qty: 30 | Fill #0

## 2015-11-08 NOTE — Assessment & Plan Note (Addendum)
Xray done 3 months ago was normal. Still possibility of newly developing OA. She does well on oral and topical NSAID as needed. Continue same for now.   NB: For cosmetic procedure, as discussed with her I am not sure what the procedure is about and I am certainly not recommending it. I however prescribed EMLA cream at her request.

## 2015-11-08 NOTE — Progress Notes (Signed)
Subjective:     Patient ID: Kathryn Hendricks, female   DOB: [birth year], 58 y.o.   MRN: [id number]  Abdominal Pain This is a new problem. The current episode started 1 to 4 weeks ago (Pain in her stomach for 2 weeks). The onset quality is sudden. The problem occurs intermittently. The most recent episode lasted 2 weeks. The problem has been gradually improving. The pain is located in the LLQ. The pain is at a severity of 6/10 (She denies any pain now. The last episode of pain was 5 days ago.). The quality of the pain is dull. The abdominal pain does not radiate. Associated symptoms include arthralgias, flatus and nausea. Pertinent negatives include no anorexia, constipation, diarrhea, dysuria, fever, melena, myalgias or vomiting. Associated symptoms comments: Whenever she has LLQ pain she will move her bowel, at times it is watery and at times it is formed. After moving her bowel she feels better and the pain resolves. She was having this symptoms daily for 2 wks but she has been symptoms free for more than 5 days now. The pain is aggravated by eating (It seems symptoms occurs whenever she eats a lot of sugery food. She was eating a whole bag a day of easter candy. She has hx of lactose intolerance and she recently had some cheese as well). Relieved by: Avoidance of cheese and reducing sugar in her diet. Treatments tried: Avoidance of cheese and reducing sugar in her diet. The treatment provided significant relief. There is no history of abdominal surgery, colon cancer or PUD.  Cosmetic: She stated she got cosmetic procedure done to her brow last week and it was painful with OTC numbing cream she got. She is going back for a touch up procedure next week and is requesting prescription for Emla cream. Grief: Patient stated she still think about her sister that passed away few months ago on and off. Denies any suicidal concern. Feels well otherwise. She went for grief counseling last year with hospice and palliative  care of [facility] twice then she stopped her follow up.  Wrist pain: Still complaining of right wrist pain and swelling intermittently, worse during cold weather. Feels better today. Topical analgesic helps.   Current Outpatient Prescriptions on File Prior to Visit  Medication Sig Dispense Refill  . albuterol (PROVENTIL HFA;VENTOLIN HFA) 108 (90 BASE) MCG/ACT inhaler Inhale 2 puffs into the lungs 4 (four) times daily. 1 Inhaler 0  . ALPRAZolam (NIRAVAM) 0.5 MG dissolvable tablet Take 1 tablet (0.5 mg total) by mouth 2 (two) times daily as needed for anxiety. 15 tablet 0  . amphetamine-dextroamphetamine (ADDERALL) 10 MG tablet Take 1 tablet (10 mg total) by mouth daily with breakfast. (Patient not taking: Reported on 11/03/2015) 30 tablet 0  . aspirin 81 MG EC tablet Take 1 tablet (81 mg total) by mouth daily. 90 tablet 1  . diclofenac sodium (VOLTAREN) 1 % GEL Apply 1 application topically 4 (four) times daily as needed. 1 gram to affected area for pain    . lisinopril (PRINIVIL,ZESTRIL) 2.5 MG tablet Take 1 tablet (2.5 mg total) by mouth daily. 90 tablet 1  . loratadine (CLARITIN) 10 MG tablet Take 10 mg by mouth daily.      . naproxen (EC NAPROSYN) 500 MG EC tablet Take 1 tablet (500 mg total) by mouth 2 (two) times daily with a meal. 60 tablet 1  . omeprazole (PRILOSEC) 40 MG capsule Take 1 capsule (40 mg total) by mouth daily. 90 capsule 1  .  simvastatin (ZOCOR) 40 MG tablet TAKE 1 TABLET BY MOUTH ONCE DAILY 90 tablet 2   No current facility-administered medications on file prior to visit.   Past Medical History  Diagnosis Date  . Hypertension   . Hyperlipidemia   . Abnormal Pap smear 2011  . Heart failure ([place])   . Cognitive decline     Filed Vitals:   11/08/15 0945  BP: 136/74  Pulse: 66  Temp: 98.4 F (36.9 C)  TempSrc: Oral  Height: 5\' 4"  (1.626 m)  Weight: 151 lb (68.493 kg)     Review of Systems  Constitutional: Negative for fever.  Respiratory: Negative.     Cardiovascular: Negative.   Gastrointestinal: Positive for nausea, abdominal pain and flatus. Negative for vomiting, diarrhea, constipation, melena and anorexia.  Genitourinary: Negative for dysuria.  Musculoskeletal: Positive for joint swelling and arthralgias. Negative for myalgias.  Psychiatric/Behavioral: Negative for suicidal ideas.       Grief  All other systems reviewed and are negative.      Objective:   Physical Exam  Constitutional: She is oriented to person, place, and time. She appears well-developed. No distress.  Cardiovascular: Normal rate, regular rhythm, normal heart sounds and intact distal pulses.   No murmur heard. Pulmonary/Chest: Effort normal and breath sounds normal. No respiratory distress. She has no wheezes.  Abdominal: Soft. Bowel sounds are normal. She exhibits no distension and no mass. There is no tenderness. There is no rebound and no guarding.  Musculoskeletal: She exhibits no edema.       Right wrist: She exhibits normal range of motion, no tenderness and no bony tenderness.       Arms: Neurological: She is alert and oriented to person, place, and time.  Psychiatric: She has a normal mood and affect. Her behavior is normal. Judgment and thought content normal. She expresses no homicidal and no suicidal ideation. She expresses no suicidal plans and no homicidal plans.  Nursing note and vitals reviewed.      Assessment:     LLQ abdominal pain Cosmetic procedure Grief Wrist pain     Plan:     Check problem list.

## 2015-11-08 NOTE — Assessment & Plan Note (Signed)
Brief counseling done. As discussed with her grief can take few weeks to months. I recommended getting back with hospice and palliative care of [facility] for counseling. She agreed to call today for appointment. She is currently not suicidal or homicidal. Appears happy. Safe to d/c home.

## 2015-11-08 NOTE — Assessment & Plan Note (Signed)
Currently asymptomatic. Differential include IBS vs lactose intolerance. Patient feels better avoiding lactose and sugary diet. I recommended avoidance of dietary triggers. F/U as needed.

## 2015-11-08 NOTE — Patient Instructions (Signed)
It was nice seeing you today. I am glad your stomach issue is better. Please plan to avoid diets that will trigger stomach irritation like sugary diet or lactose containing diet. Please follow up with hospice care for grief counseling. I will see you back as needed.

## 2015-11-17 ENCOUNTER — Other Ambulatory Visit: Payer: Self-pay

## 2015-11-17 DIAGNOSIS — Z1231 Encounter for screening mammogram for malignant neoplasm of breast: Secondary | ICD-10-CM

## 2015-12-06 ENCOUNTER — Other Ambulatory Visit: Payer: Self-pay | Admitting: Neurology

## 2015-12-06 DIAGNOSIS — G47419 Narcolepsy without cataplexy: Secondary | ICD-10-CM

## 2015-12-06 MED ORDER — AMPHETAMINE-DEXTROAMPHETAMINE 10 MG PO TABS: 10.0000 mg | ORAL_TABLET | Freq: Every day | ORAL | Status: DC

## 2015-12-06 NOTE — Telephone Encounter (Signed)
Patient is calling to order a written Rx amphetamine-dextroamphetamine 10 mg tablets.  Thanks! °

## 2015-12-06 NOTE — Telephone Encounter (Signed)
I spoke to pt and advised her that her RX is available for pick up at front desk. Pt verbalized understanding.

## 2015-12-07 MED FILL — DEXTROAMP-AMP 10 MG TAB: 10 | 30 days supply | Qty: 30 | Fill #0

## 2015-12-12 ENCOUNTER — Ambulatory Visit
Admission: RE | Admit: 2015-12-12 | Discharge: 2015-12-12 | Disposition: A | Discharge status: Home / Self Care | Payer: 59 | Source: Ambulatory Visit

## 2015-12-12 DIAGNOSIS — Z1231 Encounter for screening mammogram for malignant neoplasm of breast: Secondary | ICD-10-CM

## 2015-12-13 ENCOUNTER — Encounter (HOSPITAL_COMMUNITY): Payer: Self-pay | Admitting: Family Medicine

## 2015-12-13 ENCOUNTER — Ambulatory Visit: Payer: Self-pay | Admitting: Adult Health

## 2016-01-05 ENCOUNTER — Telehealth: Payer: Self-pay | Admitting: Family Medicine

## 2016-01-05 NOTE — Telephone Encounter (Signed)
Pt brought in forms to be completed to extend her FMLA papers. They should be faxed when complete

## 2016-01-06 NOTE — Telephone Encounter (Signed)
Form placed in provider's box.  Kiley Solimine,CMA  

## 2016-01-09 NOTE — Telephone Encounter (Signed)
Left voice message for patient to call [employer] to fax over FMLA forms for PCP to complete.  Patient's letter placed up front for pick up. Derl Barrow, RN

## 2016-01-09 NOTE — Telephone Encounter (Signed)
This a letter from her employee that she need to update her [employer] absence management form. Please have patient return with the actual form so I can fill it and sign. Her letter has been returned to [place].

## 2016-01-12 NOTE — Telephone Encounter (Signed)
Left voice message informing patient that FMLA form are complete and ready for pick up.  Forms faxed to [employer] and copied for scanning in patient's record.  Derl Barrow, RN

## 2016-01-12 NOTE — Telephone Encounter (Signed)
FMLA form completed and returned to [employer]

## 2016-01-17 ENCOUNTER — Other Ambulatory Visit: Payer: Self-pay | Admitting: Neurology

## 2016-01-17 DIAGNOSIS — G47419 Narcolepsy without cataplexy: Secondary | ICD-10-CM

## 2016-01-17 MED ORDER — AMPHETAMINE-DEXTROAMPHETAMINE 10 MG PO TABS: 10.0000 mg | ORAL_TABLET | Freq: Every day | ORAL | Status: DC

## 2016-01-17 NOTE — Telephone Encounter (Signed)
Spoke to pt and advised her that her RX for adderall is ready for pick up at the front desk. Pt verbalized understanding.

## 2016-01-17 NOTE — Telephone Encounter (Signed)
Patient requesting refill of amphetamine-dextroamphetamine (ADDERALL) 10 MG tablet . Pt said she doesn't have a lot of energy.

## 2016-01-19 MED FILL — DEXTROAMP-AMP 10 MG TAB: 10 | 30 days supply | Qty: 30 | Fill #0

## 2016-01-24 MED FILL — LISINOPRIL 2.5 MG TABLET: 2.5 | 90 days supply | Qty: 90 | Fill #1

## 2016-01-27 ENCOUNTER — Encounter: Payer: Self-pay | Admitting: Family Medicine

## 2016-01-27 ENCOUNTER — Ambulatory Visit (INDEPENDENT_AMBULATORY_CARE_PROVIDER_SITE_OTHER): Payer: 59 | Admitting: Family Medicine

## 2016-01-27 VITALS — BP 158/77 | HR 63 | Temp 98.5°F | Wt 148.0 lb

## 2016-01-27 DIAGNOSIS — N898 Other specified noninflammatory disorders of vagina: Secondary | ICD-10-CM

## 2016-01-27 DIAGNOSIS — N842 Polyp of vagina: Secondary | ICD-10-CM

## 2016-01-27 DIAGNOSIS — N819 Female genital prolapse, unspecified: Secondary | ICD-10-CM | POA: Diagnosis not present

## 2016-01-27 DIAGNOSIS — I10 Essential (primary) hypertension: Secondary | ICD-10-CM

## 2016-01-27 DIAGNOSIS — M7522 Bicipital tendinitis, left shoulder: Secondary | ICD-10-CM | POA: Diagnosis not present

## 2016-01-27 LAB — POCT WET PREP (WET MOUNT)
Clue Cells Wet Prep Whiff POC: NEGATIVE
WBC, Wet Prep HPF POC: >20

## 2016-01-27 NOTE — Assessment & Plan Note (Signed)
BP slightly elevated today. She is yet to take her meds today. She will take it as soon as she gets home.

## 2016-01-27 NOTE — Progress Notes (Signed)
Subjective:     Patient ID: Kathryn Hendricks, female   DOB: [birth year], 60 y.o.   MRN: [id number] CC: VAGINAL DISCHARGE NOT BLEEDING ( Vaginal bleeding heading is an error) Vaginal Bleeding The patient's primary symptoms include vaginal discharge. The patient's pertinent negatives include no pelvic pain or vaginal bleeding. Primary symptoms comment: Check note under vaginal bleeding. She does not have vaginal bleeding but discharge.. This is a new problem. Episode onset: 2 wks ago. The problem occurs intermittently. The problem has been unchanged. The patient is experiencing no pain (At times she feels pressure in her suprapubic area.). Associated symptoms include joint swelling. Pertinent negatives include no constipation, dysuria, fever or rash. Associated symptoms comments: Occasional abdominal pressure. She is not sexually active. The vaginal discharge was white and thin. There has been no bleeding. Nothing aggravates the symptoms. She has tried nothing for the symptoms. She is not sexually active. She is postmenopausal.  Shoulder Pain  The pain is present in the left shoulder. This is a chronic problem. Episode onset: Left shoulder pain similar since 2009. There has been no history of extremity trauma. The problem occurs intermittently. The problem has been waxing and waning. The quality of the pain is described as aching. The pain is at a severity of 8/10. Associated symptoms include joint locking, joint swelling, a limited range of motion and stiffness. Pertinent negatives include no fever or numbness. The symptoms are aggravated by cold. She has tried NSAIDS for the symptoms. The treatment provided moderate relief. There is no history of osteoarthritis.  Vaginal Discharge The patient's primary symptoms include vaginal discharge. The patient's pertinent negatives include no pelvic pain or vaginal bleeding. Primary symptoms comment: Check note under vaginal bleeding. She does not have vaginal bleeding  but discharge.. Associated symptoms include joint swelling. Pertinent negatives include no constipation, dysuria, fever or rash.  HTN: She is yet to take her Lisinopril this morning. She also endorsed being stressed.   Current Outpatient Prescriptions on File Prior to Visit  Medication Sig Dispense Refill  . amphetamine-dextroamphetamine (ADDERALL) 10 MG tablet Take 1 tablet (10 mg total) by mouth daily with breakfast. 30 tablet 0  . aspirin 81 MG EC tablet Take 1 tablet (81 mg total) by mouth daily. 90 tablet 1  . diclofenac sodium (VOLTAREN) 1 % GEL Apply 1 application topically 4 (four) times daily as needed. 1 gram to affected area for pain    . lisinopril (PRINIVIL,ZESTRIL) 2.5 MG tablet Take 1 tablet (2.5 mg total) by mouth daily. 90 tablet 1  . loratadine (CLARITIN) 10 MG tablet Take 10 mg by mouth daily. Reported on 11/08/2015    . naproxen (EC NAPROSYN) 500 MG EC tablet Take 1 tablet (500 mg total) by mouth 2 (two) times daily with a meal. 60 tablet 1  . simvastatin (ZOCOR) 40 MG tablet TAKE 1 TABLET BY MOUTH ONCE DAILY 90 tablet 2  . albuterol (PROVENTIL HFA;VENTOLIN HFA) 108 (90 BASE) MCG/ACT inhaler Inhale 2 puffs into the lungs 4 (four) times daily. (Patient not taking: Reported on 11/08/2015) 1 Inhaler 0  . omeprazole (PRILOSEC) 40 MG capsule Take 1 capsule (40 mg total) by mouth daily. (Patient not taking: Reported on 01/27/2016) 90 capsule 1   No current facility-administered medications on file prior to visit.   Past Medical History  Diagnosis Date  . Hypertension   . Hyperlipidemia   . Abnormal Pap smear 2011  . Heart failure ([place])   . Cognitive decline      Review  of Systems  Constitutional: Negative for fever.  Respiratory: Negative.   Cardiovascular: Negative.   Gastrointestinal: Negative for constipation.  Genitourinary: Positive for vaginal discharge. Negative for dysuria, vaginal bleeding and pelvic pain.  Musculoskeletal: Positive for arthralgias and stiffness.        Left shoulder pain  Skin: Negative for rash.  Neurological: Negative for numbness.  All other systems reviewed and are negative.      Filed Vitals:   01/27/16 0829  BP: 158/77  Pulse: 63  Temp: 98.5 F (36.9 C)  TempSrc: Oral  Weight: 148 lb (67.132 kg)  SpO2: 100%    Objective:   Physical Exam  Constitutional: She appears well-developed. No distress.  Cardiovascular: Normal rate, regular rhythm and normal heart sounds.   No murmur heard. Pulmonary/Chest: Effort normal and breath sounds normal. No respiratory distress. She has no wheezes.  Abdominal: Soft. Bowel sounds are normal. She exhibits no distension and no mass. There is no tenderness.  Genitourinary: Uterus normal. There is no rash, tenderness or injury on the right labia. There is no rash, tenderness or injury on the left labia. Cervix exhibits no motion tenderness and no discharge. No erythema or bleeding in the vagina. No vaginal discharge found.  Multiple polyps in the vaginal vault. ++ Vaginal vault prolapse  Musculoskeletal:       Left shoulder: She exhibits decreased range of motion and tenderness. She exhibits no swelling, no crepitus, no deformity and no spasm.  Nursing note and vitals reviewed.      Assessment:    Vaginal discharge Vaginal pressure: Prolapse/ Left shoulder pain  HTN    Plan:     Check problem list

## 2016-01-27 NOTE — Assessment & Plan Note (Signed)
Discharge looks normal. Wet prep however done. I will call her with result.

## 2016-01-27 NOTE — Assessment & Plan Note (Signed)
Likely the cause of her pelvic pressure pain and irritation. I discussed referral to Gyn for possible surgical management. She agreed with plan. Referral order placed.

## 2016-01-27 NOTE — Assessment & Plan Note (Signed)
Chronic and recurrent. No recent imaging,. Xray ordered today for reassessment. Patient advised to go to radiology to get xray done. Continue Naproxen as needed.

## 2016-01-27 NOTE — Patient Instructions (Signed)

## 2016-01-31 ENCOUNTER — Ambulatory Visit (HOSPITAL_COMMUNITY)
Admission: RE | Admit: 2016-01-31 | Discharge: 2016-01-31 | Disposition: A | Discharge status: Home / Self Care | Payer: 59 | Source: Ambulatory Visit | Attending: Family Medicine | Admitting: Family Medicine

## 2016-01-31 DIAGNOSIS — M7522 Bicipital tendinitis, left shoulder: Secondary | ICD-10-CM

## 2016-01-31 DIAGNOSIS — M19012 Primary osteoarthritis, left shoulder: Secondary | ICD-10-CM | POA: Diagnosis not present

## 2016-02-01 ENCOUNTER — Telehealth: Payer: Self-pay | Admitting: Family Medicine

## 2016-02-01 DIAGNOSIS — M19012 Primary osteoarthritis, left shoulder: Secondary | ICD-10-CM

## 2016-02-01 NOTE — Telephone Encounter (Addendum)
Dg Shoulder Left  01/31/2016  CLINICAL DATA:  Shoulder pain increasing over the past 2 weeks, no known injury, initial encounter EXAM: LEFT SHOULDER - 2+ VIEW COMPARISON:  None. FINDINGS: Mild degenerative changes of the acromioclavicular joint are seen. No acute fracture or dislocation is noted. The underlying bony thorax is within normal limits. IMPRESSION: Degenerative change without acute abnormality. Electronically Signed   By: Inez Catalina M.D.   On: 01/31/2016 20:34     Xray report discussed with patient. Plan to refer to PT. She agreed with plan. She also asked about her Gyn referral. I will send message to [facility] to look into it.   Tia please help with her Gyn referral. Thanks.

## 2016-02-01 NOTE — Telephone Encounter (Signed)
Referral was just placed on 01/27/16. She will receive a call from [facility] in a week or so to schedule.

## 2016-02-01 NOTE — Telephone Encounter (Signed)
Perfect. Thanks. I guess she is getting anxious.

## 2016-02-09 MED FILL — CLINDAMYCIN HCL 300 MG CAPS: 300 | 10 days supply | Qty: 40 | Fill #0

## 2016-02-14 ENCOUNTER — Ambulatory Visit (INDEPENDENT_AMBULATORY_CARE_PROVIDER_SITE_OTHER): Payer: 59 | Admitting: Family Medicine

## 2016-02-14 ENCOUNTER — Encounter: Payer: Self-pay | Admitting: Family Medicine

## 2016-02-14 VITALS — BP 134/62 | HR 68 | Temp 97.6°F | Ht 64.0 in | Wt 147.0 lb

## 2016-02-14 DIAGNOSIS — M25512 Pain in left shoulder: Secondary | ICD-10-CM | POA: Insufficient documentation

## 2016-02-14 HISTORY — DX: Pain in left shoulder: M25.512

## 2016-02-14 NOTE — Assessment & Plan Note (Signed)
Chronic. Likely due to some element of biceps tendinitis and DJD. Recent xray of her left shoulder reviewed with no acute finding. Continue NSAID as needed. I will redo her FMLA form. She will need time off for PT treatment and also whenever she has a flare. Return precaution discussed.

## 2016-02-14 NOTE — Progress Notes (Signed)
Subjective:     Patient ID: Kathryn Hendricks, female   DOB: [birth year], 58 y.o.   MRN: [id number]  HPI Shoulder pain: Here for follow up. She continues to have difficulty with left shoulder ROM due to pain. Pain is worse when she is in a cold environment, it improves some with her NSAID. She stated she missed her PT appointment since she was not able to get off work. She is here to discuss FMLA form. The initial form completed was rejected because number of days needed off work was not specified. Otherwise no new concern.  Current Outpatient Prescriptions on File Prior to Visit  Medication Sig Dispense Refill  . albuterol (PROVENTIL HFA;VENTOLIN HFA) 108 (90 BASE) MCG/ACT inhaler Inhale 2 puffs into the lungs 4 (four) times daily. 1 Inhaler 0  . amphetamine-dextroamphetamine (ADDERALL) 10 MG tablet Take 1 tablet (10 mg total) by mouth daily with breakfast. 30 tablet 0  . aspirin 81 MG EC tablet Take 1 tablet (81 mg total) by mouth daily. 90 tablet 1  . lisinopril (PRINIVIL,ZESTRIL) 2.5 MG tablet Take 1 tablet (2.5 mg total) by mouth daily. 90 tablet 1  . simvastatin (ZOCOR) 40 MG tablet TAKE 1 TABLET BY MOUTH ONCE DAILY 90 tablet 2  . diclofenac sodium (VOLTAREN) 1 % GEL Apply 1 application topically 4 (four) times daily as needed. Reported on 02/14/2016    . loratadine (CLARITIN) 10 MG tablet Take 10 mg by mouth daily. Reported on 02/14/2016    . naproxen (EC NAPROSYN) 500 MG EC tablet Take 1 tablet (500 mg total) by mouth 2 (two) times daily with a meal. 60 tablet 1  . omeprazole (PRILOSEC) 40 MG capsule Take 1 capsule (40 mg total) by mouth daily. (Patient not taking: Reported on 01/27/2016) 90 capsule 1   No current facility-administered medications on file prior to visit.   Past Medical History  Diagnosis Date  . Hypertension   . Hyperlipidemia   . Abnormal Pap smear 2011  . Heart failure ([place])   . Cognitive decline    Filed Vitals:   02/14/16 0953  BP: 134/62  Pulse: 68  Temp: 97.6 F  (36.4 C)  TempSrc: Oral  Height: 5\' 4"  (1.626 m)  Weight: 147 lb (66.679 kg)  SpO2: 99%     Review of Systems  Respiratory: Negative.   Cardiovascular: Negative.   Gastrointestinal: Negative.   Musculoskeletal: Positive for arthralgias.       Left shoulder pain  All other systems reviewed and are negative.      Objective:   Physical Exam  Constitutional: She appears well-developed. No distress.  Cardiovascular: Normal rate, regular rhythm and normal heart sounds.   No murmur heard. Pulmonary/Chest: Effort normal and breath sounds normal. No respiratory distress. She has no wheezes.  Musculoskeletal:       Right shoulder: Normal.       Left shoulder: She exhibits decreased range of motion and tenderness. She exhibits no swelling, no effusion, no crepitus, no deformity and no laceration.  Nursing note and vitals reviewed.      Assessment:     Left shoulder pain     Plan:     Check problem list.

## 2016-02-14 NOTE — Patient Instructions (Signed)
It was nice seeing you today. I am sorry you are still having the shoulder pain. You may continue pain medicine as needed per instruction previously given. I will address your FMLA form and refax. Call me if you have any question.

## 2016-02-20 ENCOUNTER — Ambulatory Visit (INDEPENDENT_AMBULATORY_CARE_PROVIDER_SITE_OTHER): Payer: 59 | Admitting: Gynecology

## 2016-02-20 ENCOUNTER — Encounter: Payer: Self-pay | Admitting: Gynecology

## 2016-02-20 VITALS — BP 118/76 | Ht 64.0 in | Wt 149.0 lb

## 2016-02-20 DIAGNOSIS — N3946 Mixed incontinence: Secondary | ICD-10-CM

## 2016-02-20 DIAGNOSIS — N814 Uterovaginal prolapse, unspecified: Secondary | ICD-10-CM | POA: Diagnosis not present

## 2016-02-20 DIAGNOSIS — N8111 Cystocele, midline: Secondary | ICD-10-CM

## 2016-02-20 NOTE — Patient Instructions (Signed)
Office will call you to arrange for the urology appointment and for the hysterectomy surgery.

## 2016-02-20 NOTE — Progress Notes (Signed)
    Kathryn Hendricks [birth year] [id number]        60 y.o.  [id number] presents with her daughter in consultation from Dr Gwendlyn Deutscher who she normally sees for routine health care to include gynecologic care where a recent evaluation for discharge revealed pelvic prolapse and questionable vaginal polyps.  Patient notes over the past year or so feeling something coming out of her vagina when she stands or strains. Not painful but bothersome. Not having issues with constipation or diarrhea. Is having issues with urinary incontinence to include a mixed pattern of urgency as well as stress with loss of urine with coughing sneezing and laughing.  Last Pap smear/HPV negative 06/2015.  History of hysteroscopy with endometrial ablation 2010 for bleeding, tubal sterilization and appendectomy. Lists heart failure is a past medical history although the patient relates being told by the cardiologist she did really did not need to follow up with them. Is being followed for hypertension and hyperlipidemia.  Past medical history,surgical history, problem list, medications, allergies, family history and social history were all reviewed and documented in the EPIC chart.  Directed ROS with pertinent positives and negatives documented in the history of present illness/assessment and plan.  Exam: [name] assistant Filed Vitals:   02/20/16 1247  BP: 118/76  Height: 5\' 4"  (1.626 m)  Weight: 149 lb (67.586 kg)   General appearance:  Normal Abdomen soft nontender without masses guarding rebound Pelvic external BUS vagina with second-degree uterine prolapse with cervix visual at the introital opening. Associated cystocele. Cervix with some distortion from the prolapse/pressure but no evidence of polyps or other pathology. Uterus grossly normal size midline mobile nontender. Adnexa without masses or tenderness. Rectovaginal exam with some rectovaginal septal weakness but no overt significant rectocele.  Assessment/Plan:  60  y.o. [id number] with symptomatic uterine prolapse feeling her cervix at the introital opening. Has associated cystocele with urinary incontinence in a mixed pattern. Reviewed with the patient and her daughter options to include expectant management, pessary or surgery. Attempted vaginal hysterectomy with BSO if possible with cystocele repair possible sling reviewed. Recommended patient follow up with urology for better evaluation and would plan a combined procedure where I would perform the hysterectomy BSO and urology would perform the cystocele/bladder support surgery. Issues if I cannot retrieve ovaries vaginally that would require a separate incision discussed and the risks of leaving her ovaries. She has no family history of ovarian cancer or breast cancer.  Will rediscuss preoperatively. We'll also have cardiology clearance given the listed problem of heart failure.    Anastasio Auerbach MD, 1:25 PM 02/20/2016

## 2016-02-21 ENCOUNTER — Telehealth: Payer: Self-pay

## 2016-02-21 ENCOUNTER — Telehealth: Payer: Self-pay | Admitting: *Deleted

## 2016-02-21 LAB — URINALYSIS W MICROSCOPIC + REFLEX CULTURE
BACTERIA UA: NONE SEEN [HPF]
BILIRUBIN URINE: NEGATIVE
CASTS: NONE SEEN [LPF]
Glucose, UA: NEGATIVE
LEUKOCYTES UA: NEGATIVE
Nitrite: NEGATIVE
PH: 5.5 (ref 5.0–8.0)
Protein, ur: NEGATIVE
RBC / HPF: NONE SEEN RBC/HPF (ref <=2)
Specific Gravity, Urine: 1.026 (ref 1.001–1.035)
WBC, UA: NONE SEEN WBC/HPF (ref <=5)
YEAST: NONE SEEN [HPF]

## 2016-02-21 NOTE — Telephone Encounter (Signed)
-----   Message from Ramond Craver, [place] sent at 02/21/2016  9:25 AM EDT ----- Regarding: urology referral Per Dr. Loetta Rough "Special instructions: # 2 appointment to see Dr. Bernerd Limbo in urology reference patient with cystocele/urinary incontinence for TVH/BSO. To Evaluate for cystocele and required bladder support surgery as combined procedure ."   Anderson Malta, Dr. Loetta Rough going to schedule hysterectomy and wants her to see urologist to see if he need's to coordinate surgery with urologist for bladder.   Thanks!!!

## 2016-02-21 NOTE — Telephone Encounter (Signed)
Notes faxed they will fax me back with time and date to relay.

## 2016-02-21 NOTE — Telephone Encounter (Signed)
I left message for patient to call her. I told her I had checked on her ins benefits and also needed to speak with her about appointments she needs first. Asked her to call me.

## 2016-02-24 ENCOUNTER — Telehealth: Payer: Self-pay

## 2016-02-24 NOTE — Telephone Encounter (Signed)
I called patient and left her a message that I have scheduled her appt for cardiology clearance for July 25th at 11:45am with Havery Moros who is Dr. Camillia Herter PA.  I left my direct number and told her to call me if any questions.

## 2016-03-05 ENCOUNTER — Other Ambulatory Visit: Payer: Self-pay | Admitting: Neurology

## 2016-03-05 DIAGNOSIS — G47419 Narcolepsy without cataplexy: Secondary | ICD-10-CM

## 2016-03-05 MED ORDER — AMPHETAMINE-DEXTROAMPHETAMINE 10 MG PO TABS: 10.0000 mg | ORAL_TABLET | Freq: Every day | ORAL | Status: DC

## 2016-03-05 NOTE — Telephone Encounter (Signed)
Patient requesting refill of amphetamine-dextroamphetamine (ADDERALL) 10 MG tablet. ° ° °

## 2016-03-06 NOTE — Telephone Encounter (Signed)
I left a detailed message on home number (per DPR) that pt's RX is ready for pick up at the front desk and to call for any further questions.

## 2016-03-08 MED FILL — DEXTROAMP-AMP 10 MG TAB: 10 | 30 days supply | Qty: 30 | Fill #0

## 2016-03-08 NOTE — Telephone Encounter (Signed)
Appointment on 04/19/16 @ 1:30pm with Dr.MacDiarmid, pt aware

## 2016-03-19 ENCOUNTER — Ambulatory Visit: Payer: 59 | Admitting: Physician Assistant

## 2016-03-29 ENCOUNTER — Ambulatory Visit (INDEPENDENT_AMBULATORY_CARE_PROVIDER_SITE_OTHER): Payer: 59 | Admitting: Physician Assistant

## 2016-03-29 ENCOUNTER — Encounter: Payer: Self-pay | Admitting: Physician Assistant

## 2016-03-29 VITALS — BP 148/96 | HR 69 | Ht 64.5 in | Wt 147.8 lb

## 2016-03-29 DIAGNOSIS — I10 Essential (primary) hypertension: Secondary | ICD-10-CM | POA: Diagnosis not present

## 2016-03-29 DIAGNOSIS — M79604 Pain in right leg: Secondary | ICD-10-CM

## 2016-03-29 DIAGNOSIS — I491 Atrial premature depolarization: Secondary | ICD-10-CM | POA: Diagnosis not present

## 2016-03-29 DIAGNOSIS — Z01810 Encounter for preprocedural cardiovascular examination: Secondary | ICD-10-CM

## 2016-03-29 DIAGNOSIS — I493 Ventricular premature depolarization: Secondary | ICD-10-CM

## 2016-03-29 DIAGNOSIS — R0609 Other forms of dyspnea: Secondary | ICD-10-CM

## 2016-03-29 DIAGNOSIS — E785 Hyperlipidemia, unspecified: Secondary | ICD-10-CM

## 2016-03-29 NOTE — Patient Instructions (Signed)
Medication Instructions:  Your physician recommends that you continue on your current medications as directed. Please refer to the Current Medication list given to you today.   Labwork: none  Testing/Procedures: Your physician has requested that you have an echocardiogram. Echocardiography is a painless test that uses sound waves to create images of your heart. It provides your doctor with information about the size and shape of your heart and how well your heart's chambers and valves are working. This procedure takes approximately one hour. There are no restrictions for this procedure.  Your physician has requested that you have en exercise stress myoview. For further information please visit [email or web address]. Please follow instruction sheet, as given.  Your physician has requested that you have an ankle brachial index (ABI). During this test an ultrasound and blood pressure cuff are used to evaluate the arteries that supply the arms and legs with blood. Allow thirty minutes for this exam. There are no restrictions or special instructions.    Follow-Up: Your physician wants you to follow-up in: 6 months with Dr. Angelena Form. You will receive a reminder letter in the mail two months in advance. If you don't receive a letter, please call our office to schedule the follow-up appointment.   Any Other Special Instructions Will Be Listed Below (If Applicable).     If you need a refill on your cardiac medications before your next appointment, please call your pharmacy.

## 2016-03-29 NOTE — Progress Notes (Signed)
Cardiology Office Note    Date:  03/29/2016  ID:  Kathryn Hendricks, Kathryn Hendricks 11/16/1955, MRN [id number] PCP:  Andrena Mews, MD  Cardiologist: Dr. Angelena Form   Chief Complaint: cardiac clearance  History of Present Illness:  Kathryn Hendricks is a 60 y.o. female with history of HTN, HLD, GERD, narcolepsy, PACs, PVCs who presents for pre-operative clearance. Last 2D echo 2014: EF 60-70%, grade 1 DD. She was first seen in 2015 by Dr. Angelena Form to discuss dyspnea and palpitations while taking Ritalin. 48 hour holter showed NSR with rare PACs and PVCs. She underwent ETT but did not reach target heart rate on her stress test. Exercised 77min, HR 59->115, no ST changes. Dr. Angelena Form recommended a Leane Call but the patient declined. 48 hour monitor showed sinus with rare PACs and rare PVCs. At last f/u 11/2014, Dr. Angelena Form did not feel further testing was necessary given no further symptoms at that time.  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 She carries a diagnosis of heart failure in her chart and was referred for cardiac clearance for hysterectomy and BSO with cystocele/bladder support surgery for uterine prolapse. She says she was told she had this several years ago. She has not been hospitalized for it. Over the last several months she has noticed progressive DOE, particularly with walking up steps. She has to stop and rest and it improves. She does not smoke. She has not had any CP, syncope or palpitations. No leg swelling but she does c/o right leg pain when she walks.   Past Medical History:  Diagnosis Date  . Abnormal Pap smear 2011  . Cognitive decline   . Hyperlipidemia   . Hypertension   . Narcolepsy   . Premature atrial contractions   . PVC's (premature ventricular contractions)     Past Surgical History:  Procedure Laterality Date  . APPENDECTOMY    . COLONOSCOPY    . DILATION AND CURETTAGE OF UTERUS  2010  . HYSTEROSCOPY W/ ENDOMETRIAL ABLATION  2010  . TUBAL  LIGATION    . WISDOM TOOTH EXTRACTION      Current Medications: Current Outpatient Prescriptions  Medication Sig Dispense Refill  . albuterol (PROVENTIL HFA;VENTOLIN HFA) 108 (90 BASE) MCG/ACT inhaler Inhale 2 puffs into the lungs 4 (four) times daily. 1 Inhaler 0  . amphetamine-dextroamphetamine (ADDERALL) 10 MG tablet Take 1 tablet (10 mg total) by mouth daily with breakfast. 30 tablet 0  . aspirin 81 MG EC tablet Take 1 tablet (81 mg total) by mouth daily. 90 tablet 1  . diclofenac sodium (VOLTAREN) 1 % GEL Apply 1 application topically 4 (four) times daily as needed. Reported on 02/14/2016    . lisinopril (PRINIVIL,ZESTRIL) 2.5 MG tablet Take 1 tablet (2.5 mg total) by mouth daily. 90 tablet 1  . loratadine (CLARITIN) 10 MG tablet Take 10 mg by mouth daily. Reported on 02/14/2016    . naproxen (EC NAPROSYN) 500 MG EC tablet Take 1 tablet (500 mg total) by mouth 2 (two) times daily with a meal. 60 tablet 1  . omeprazole (PRILOSEC) 40 MG capsule Take 1 capsule (40 mg total) by mouth daily. 90 capsule 1  . simvastatin (ZOCOR) 40 MG tablet TAKE 1 TABLET BY MOUTH ONCE DAILY 90 tablet 2   No current facility-administered medications for this visit.      Allergies:   Penicillins   Social History   Social History  . Marital status: Divorced    Spouse name: N/A  .  Number of children: 3  . Years of education: 12   Occupational History  . [place] History Main Topics  . Smoking status: Never Smoker  . Smokeless tobacco: Never Used  . Alcohol use No  . Drug use: No  . Sexual activity: No     Comment: 1st intercourse 63 yo-5 partners   Other Topics Concern  . None   Social History Narrative   Caffeine 1 cup coffee in am.     Family History:  The patient's family history includes CAD (age of onset: 67) in her mother; Cancer in her brother and sister; Diabetes in her father and mother; Heart disease in her father; Hypertension in her brother, mother, and  sister; Narcolepsy in her mother; Stroke in her mother.  ROS:   Please see the history of present illness.  All other systems are reviewed and otherwise negative.    PHYSICAL EXAM:   VS:  BP (!) 148/96   Pulse 69   Ht 5' 4.5" (1.638 m)   Wt 147 lb 12.8 oz (67 kg)   BMI 24.98 kg/m   BMI: Body mass index is 24.98 kg/m. GEN: Well nourished, well developed thin AAF, in no acute distress  HEENT: normocephalic, atraumatic Neck: no JVD, carotid bruits, or masses Cardiac: RRR very faint SEM, no rubs or gallops, no edema. Pedal pulses are mildly diminished Respiratory:  clear to auscultation bilaterally, normal work of breathing GI: soft, nontender, nondistended, + BS MS: no deformity or atrophy  Skin: warm and dry, no rash Neuro:  Alert and Oriented x 3, Strength and sensation are intact, follows commands Psych: euthymic mood, full affect  Wt Readings from Last 3 Encounters:  03/29/16 147 lb 12.8 oz (67 kg)  02/20/16 149 lb (67.6 kg)  02/14/16 147 lb (66.7 kg)      Studies/Labs Reviewed:   EKG:  EKG was ordered today and personally reviewed by me and demonstrates NSR 69bpm, minimal early ST upsloping II, III, avF, V4-V6. No change from prior.  Recent Labs: No results found for requested labs within last 8760 hours.   Lipid Panel    Component Value Date/Time   CHOL 178 09/10/2013 1136   TRIG 70 09/10/2013 1136   HDL 59 09/10/2013 1136   CHOLHDL 3.0 09/10/2013 1136   VLDL 14 09/10/2013 1136   LDLCALC 105 (H) 09/10/2013 1136   LDLDIRECT 136 (H) 08/09/2008 2048    Additional studies/ records that were reviewed today include: Summarized above.    ASSESSMENT & PLAN:   1. Pre-op evaluation - do not want to clear her until we further evaluate her dyspnea. See below. 2. Dyspnea on exertion - progressive, improves with rest. Cardiac risk factors include HTN, HLD, secondhand smoke exposure, and family history of CAD. Will update her labwork. Will also proceed with nuclear  stress test - note she did not reach her target in 2015 and nuc was recommended at that time. If she does not reach target, would change to [place]. Will also update 2D echocardiogram given possible history of CHF and very minimal systolic murmur. This may be due to her elevated BP in clinic today. Further recs pending the above workup. 3. PACs/PVCs - quiescent. 4. Essential HTN - elevated in clinic today with recheck 149/80. Will await BMET and plan to increase lisinopril if appropriate. 5. Hyperlipidemia - followed by PCP. 6. Right leg pain - ?claudication. She reviewed our PV screening and feels her symptoms remind her of that.  Will proceed with bilateral ABIs for comparison, with reflex duplex if appropriate.  Disposition: F/u with Dr. Angelena Form in 6 months.   Medication Adjustments/Labs and Tests Ordered: Current medicines are reviewed at length with the patient today.  Concerns regarding medicines are outlined above. Medication changes, Labs and Tests ordered today are summarized above and listed in the Patient Instructions accessible in Encounters.   Raechel Ache PA-C  03/29/2016 3:56 PM    [facility] [address], [place], [place]  [address] Phone: [phone]; Fax: [phone]

## 2016-04-02 ENCOUNTER — Other Ambulatory Visit: Payer: Self-pay | Admitting: Physician Assistant

## 2016-04-02 DIAGNOSIS — I739 Peripheral vascular disease, unspecified: Secondary | ICD-10-CM

## 2016-04-05 ENCOUNTER — Ambulatory Visit (HOSPITAL_COMMUNITY)
Admission: RE | Admit: 2016-04-05 | Discharge: 2016-04-05 | Disposition: A | Discharge status: Home / Self Care | Payer: 59 | Source: Ambulatory Visit | Attending: Cardiology | Admitting: Cardiology

## 2016-04-05 DIAGNOSIS — I10 Essential (primary) hypertension: Secondary | ICD-10-CM | POA: Diagnosis not present

## 2016-04-05 DIAGNOSIS — I739 Peripheral vascular disease, unspecified: Secondary | ICD-10-CM | POA: Diagnosis not present

## 2016-04-05 DIAGNOSIS — E785 Hyperlipidemia, unspecified: Secondary | ICD-10-CM | POA: Insufficient documentation

## 2016-04-19 ENCOUNTER — Telehealth (HOSPITAL_COMMUNITY): Payer: Self-pay | Admitting: *Deleted

## 2016-04-19 DIAGNOSIS — R351 Nocturia: Secondary | ICD-10-CM | POA: Diagnosis not present

## 2016-04-19 DIAGNOSIS — N393 Stress incontinence (female) (male): Secondary | ICD-10-CM | POA: Diagnosis not present

## 2016-04-19 DIAGNOSIS — R350 Frequency of micturition: Secondary | ICD-10-CM | POA: Diagnosis not present

## 2016-04-19 DIAGNOSIS — N8111 Cystocele, midline: Secondary | ICD-10-CM | POA: Diagnosis not present

## 2016-04-19 NOTE — Telephone Encounter (Signed)
Patient given detailed instructions per Myocardial Perfusion Study Information Sheet for the test on 8/84/17. Patient notified to arrive 15 minutes early and that it is imperative to arrive on time for appointment to keep from having the test rescheduled.  If you need to cancel or reschedule your appointment, please call the office within 24 hours of your appointment. Failure to do so may result in a cancellation of your appointment, and a $50 no show fee. Patient verbalized understanding. Hubbard Robinson, RN

## 2016-04-24 ENCOUNTER — Ambulatory Visit (HOSPITAL_COMMUNITY): Payer: 59 | Attending: Cardiology

## 2016-04-24 ENCOUNTER — Other Ambulatory Visit: Payer: Self-pay | Admitting: Neurology

## 2016-04-24 ENCOUNTER — Other Ambulatory Visit: Payer: Self-pay | Admitting: Family Medicine

## 2016-04-24 ENCOUNTER — Ambulatory Visit (INDEPENDENT_AMBULATORY_CARE_PROVIDER_SITE_OTHER): Payer: 59 | Admitting: *Deleted

## 2016-04-24 ENCOUNTER — Ambulatory Visit (HOSPITAL_BASED_OUTPATIENT_CLINIC_OR_DEPARTMENT_OTHER): Payer: 59

## 2016-04-24 ENCOUNTER — Other Ambulatory Visit: Payer: Self-pay

## 2016-04-24 VITALS — BP 160/80 | HR 60

## 2016-04-24 DIAGNOSIS — R0600 Dyspnea, unspecified: Secondary | ICD-10-CM | POA: Diagnosis present

## 2016-04-24 DIAGNOSIS — G47419 Narcolepsy without cataplexy: Secondary | ICD-10-CM

## 2016-04-24 DIAGNOSIS — R0609 Other forms of dyspnea: Secondary | ICD-10-CM | POA: Diagnosis not present

## 2016-04-24 DIAGNOSIS — Z136 Encounter for screening for cardiovascular disorders: Secondary | ICD-10-CM

## 2016-04-24 DIAGNOSIS — R42 Dizziness and giddiness: Secondary | ICD-10-CM | POA: Insufficient documentation

## 2016-04-24 DIAGNOSIS — I313 Pericardial effusion (noninflammatory): Secondary | ICD-10-CM | POA: Diagnosis not present

## 2016-04-24 DIAGNOSIS — I119 Hypertensive heart disease without heart failure: Secondary | ICD-10-CM | POA: Diagnosis not present

## 2016-04-24 DIAGNOSIS — R002 Palpitations: Secondary | ICD-10-CM | POA: Insufficient documentation

## 2016-04-24 DIAGNOSIS — I10 Essential (primary) hypertension: Secondary | ICD-10-CM

## 2016-04-24 DIAGNOSIS — Z0130 Encounter for examination of blood pressure without abnormal findings: Secondary | ICD-10-CM

## 2016-04-24 LAB — MYOCARDIAL PERFUSION IMAGING
CHL CUP NUCLEAR SRS: 0
CHL CUP STRESS STAGE 1 HR: 50 {beats}/min
CHL CUP STRESS STAGE 1 SBP: 140 mmHg
CHL CUP STRESS STAGE 1 SPEED: 0 mph
CHL CUP STRESS STAGE 2 GRADE: 0 %
CHL CUP STRESS STAGE 2 SPEED: 0 mph
CHL CUP STRESS STAGE 3 DBP: 89 mmHg
CHL CUP STRESS STAGE 3 SBP: 136 mmHg
CHL CUP STRESS STAGE 3 SPEED: 0 mph
CHL CUP STRESS STAGE 5 GRADE: 0 %
CHL CUP STRESS STAGE 5 HR: 83 {beats}/min
CHL CUP STRESS STAGE 5 SBP: 143 mmHg
CHL CUP STRESS STAGE 5 SPEED: 0 mph
CHL CUP STRESS STAGE 6 GRADE: 0 %
CHL CUP STRESS STAGE 6 HR: 73 {beats}/min
CHL CUP STRESS STAGE 6 SBP: 129 mmHg
CSEPPMHR: 57 %
Estimated workload: 1 METS
LV sys vol: 20 mL
LVDIAVOL: 65 mL (ref 46–106)
Peak HR: 93 {beats}/min
RATE: 0.31
Rest HR: 51 {beats}/min
SDS: 3
SSS: 3
Stage 1 DBP: 88 mmHg
Stage 1 Grade: 0 %
Stage 2 HR: 50 {beats}/min
Stage 3 Grade: 0 %
Stage 3 HR: 71 {beats}/min
Stage 4 Grade: 0 %
Stage 4 HR: 93 {beats}/min
Stage 4 Speed: 0 mph
Stage 5 DBP: 83 mmHg
Stage 6 DBP: 82 mmHg
Stage 6 Speed: 0 mph
TID: 0.91

## 2016-04-24 MED ORDER — AMPHETAMINE-DEXTROAMPHETAMINE 10 MG PO TABS: 10.0000 mg | ORAL_TABLET | Freq: Every day | ORAL | 0 refills | Status: DC

## 2016-04-24 MED ORDER — LISINOPRIL 5 MG PO TABS: 5.0000 mg | ORAL_TABLET | Freq: Every day | ORAL | 1 refills | Status: DC

## 2016-04-24 MED ORDER — TECHNETIUM TC 99M TETROFOSMIN IV KIT
10.9000 | PACK | Freq: Once | INTRAVENOUS | Status: AC | PRN
  Administered 2016-04-24: 10.9 via INTRAVENOUS
  Filled 2016-04-24: qty 11

## 2016-04-24 MED ORDER — REGADENOSON 0.4 MG/5ML IV SOLN
0.4000 mg | Freq: Once | INTRAVENOUS | Status: AC
  Administered 2016-04-24: 0.4 mg via INTRAVENOUS

## 2016-04-24 MED ORDER — ALBUTEROL SULFATE HFA 108 (90 BASE) MCG/ACT IN AERS: 2.0000 | INHALATION_SPRAY | Freq: Four times a day (QID) | RESPIRATORY_TRACT | 4 refills | Status: DC

## 2016-04-24 MED ORDER — SIMVASTATIN 40 MG PO TABS: 40.0000 mg | ORAL_TABLET | Freq: Every day | ORAL | 1 refills | Status: DC

## 2016-04-24 MED ORDER — NAPROXEN 500 MG PO TBEC: 500.0000 mg | DELAYED_RELEASE_TABLET | Freq: Two times a day (BID) | ORAL | 1 refills | Status: DC | PRN

## 2016-04-24 MED ORDER — TECHNETIUM TC 99M TETROFOSMIN IV KIT
32.1000 | PACK | Freq: Once | INTRAVENOUS | Status: AC | PRN
  Administered 2016-04-24: 32.1 via INTRAVENOUS
  Filled 2016-04-24: qty 32

## 2016-04-24 MED FILL — LISINOPRIL 5 MG TABLET: 5 | 90 days supply | Qty: 90 | Fill #0

## 2016-04-24 MED FILL — DEXTROAMP-AMP 10 MG TAB: 10 | 30 days supply | Qty: 30 | Fill #0

## 2016-04-24 NOTE — Progress Notes (Signed)
   Patient in nurse clinic for blood pressure check.  Patient was seen at [facility] today and stated her blood pressure was elevated.  Pt denies chest pain, SOB, dizziness.  BP left arm 170/100 manually and rt arm 160/80 manually, heart rate 60.  Precept with Dr. Gwendlyn Deutscher, will increase Lisinopril to 5 mg, patient to pick up medication from pharmacy today.  Follow up on Friday 04/27/16.  Appt 04/27/16 at 3:30 PM with Haney.  Will forward to PCP.  Derl Barrow, RN

## 2016-04-24 NOTE — Telephone Encounter (Signed)
I spoke to pt and advised her that the adderall RX is ready for pick up at the front desk. Pt verbalized understanding.

## 2016-04-24 NOTE — Progress Notes (Signed)
Discussed with Tamika. I increased her BP meds. Continue home BP check. F/U in 1 wk.

## 2016-04-24 NOTE — Telephone Encounter (Signed)
Pt came by office stated she is needing refills on all of her meds. Pt has spells of  feeling light headed .  Pt is [employer] employee. Please let her know once completed

## 2016-04-24 NOTE — Telephone Encounter (Signed)
Patient requesting refill of amphetamine-dextroamphetamine (ADDERALL) 10 MG tablet Pt sts she has been out of the medication for 2 wks. Sts she was trying to see if she could function without it but she can't. Pt said she is driving and is very sleepy, she would like to pick up RX today

## 2016-04-27 ENCOUNTER — Ambulatory Visit (INDEPENDENT_AMBULATORY_CARE_PROVIDER_SITE_OTHER): Payer: 59 | Admitting: Student

## 2016-04-27 VITALS — BP 135/82 | HR 68 | Temp 98.5°F | Wt 150.0 lb

## 2016-04-27 DIAGNOSIS — I10 Essential (primary) hypertension: Secondary | ICD-10-CM | POA: Diagnosis not present

## 2016-04-27 LAB — BASIC METABOLIC PANEL
BUN: 11 mg/dL (ref 7–25)
CHLORIDE: 104 mmol/L (ref 98–110)
CO2: 31 mmol/L (ref 20–31)
CREATININE: 0.68 mg/dL (ref 0.50–1.05)
Calcium: 8.9 mg/dL (ref 8.6–10.4)
Glucose, Bld: 72 mg/dL (ref 65–99)
Potassium: 4.1 mmol/L (ref 3.5–5.3)
Sodium: 140 mmol/L (ref 135–146)

## 2016-04-27 NOTE — Progress Notes (Signed)
   Subjective:    Patient ID: Kathryn Hendricks, female    DOB: March 26, 1956, 60 y.o.   MRN: [id number]   CC:Follow-up blood pressure   HPI:  60 year old female here for blood pressure check    Blood pressure - started on lisinoril 5 mg one week ago - reports compliance wit this - denies headache, changes in vision, chest pain, SOB - reports blurry vision with lightheadedness at work several days ago. A co Oncologist her BP and it was in the 160s/90s. These symptoms self resolved wit hrest  Smoking status reviewed  Review of Systems  Per HPI, else denies recent illness, fever, abdominal pain, N/V/D, weakness    Objective:  BP 135/82   Pulse 68   Temp 98.5 F (36.9 C) (Oral)   Wt 150 lb (68 kg)   SpO2 97%   BMI 25.35 kg/m  Vitals and nursing note reviewed  General: NAD Cardiac: RRR,  Respiratory: CTAB, normal effort Extremities: no edema or cyanosis. WWP. Skin: warm and dry, no rashes noted Neuro: alert and oriented   Assessment & Plan:    HYPERTENSION, BENIGN Blood pressure improved with lisinopril - BMP today - pateitn encouraged to check BP at home especially if she has blurry vision/headache and bring to next office visit    Alyssa A. Lincoln Brigham MD, [facility] Resident PGY-3 Pager [phone]

## 2016-04-27 NOTE — Assessment & Plan Note (Signed)
Blood pressure improved with lisinopril - BMP today - pateitn encouraged to check BP at home especially if she has blurry vision/headache and bring to next office visit

## 2016-04-27 NOTE — Patient Instructions (Signed)
Follow-up with PCP as needed Your blood pressure was largely improved If you have  have headaches, changes in vision check his blood pressure right away Call the office if any questions or concerns

## 2016-05-03 ENCOUNTER — Ambulatory Visit: Payer: 59 | Admitting: Neurology

## 2016-05-10 ENCOUNTER — Ambulatory Visit: Payer: 59 | Admitting: Neurology

## 2016-05-21 ENCOUNTER — Telehealth: Payer: Self-pay | Admitting: Family Medicine

## 2016-05-21 NOTE — Telephone Encounter (Signed)
Please call to advise patient.  I got a letter from her work that she has increase frequency of missing work due to her health condition. According to her previous FMLA completed by me, she may have flare-ups 1-6 times per year and she is now going beyond 6 times per year which is pretty unusual given the nature of her MSK condition.  You would also think if she is having more flare-ups she should see me frequently but that is not the case. For me to re do her FMLA and increase frequency of flare-ups, she will need full work up and referral to orthopedics for recommendations for her shoulder tendinitis.  She may schedule follow up with me soon for further discussion.

## 2016-05-21 NOTE — Telephone Encounter (Signed)
Pt came by and dropped off forms to be filled out and fax to [employer]. Forms have been placed in the blue team folder. jw

## 2016-05-21 NOTE — Telephone Encounter (Signed)
Clinical info completed on FMLA form.  Place form in Dr. Eniola's box for completion.  Kathryn Hendricks,  Kathryn Hendricks, CMA   

## 2016-05-22 ENCOUNTER — Encounter: Payer: Self-pay | Admitting: Family Medicine

## 2016-05-22 ENCOUNTER — Ambulatory Visit (INDEPENDENT_AMBULATORY_CARE_PROVIDER_SITE_OTHER): Payer: 59 | Admitting: Family Medicine

## 2016-05-22 ENCOUNTER — Telehealth: Payer: Self-pay | Admitting: Family Medicine

## 2016-05-22 DIAGNOSIS — M25512 Pain in left shoulder: Secondary | ICD-10-CM | POA: Diagnosis not present

## 2016-05-22 DIAGNOSIS — M7522 Bicipital tendinitis, left shoulder: Secondary | ICD-10-CM

## 2016-05-22 MED ORDER — DICLOFENAC SODIUM 1 % TD GEL: 2.0000 g | Freq: Four times a day (QID) | TRANSDERMAL | 1 refills | Status: DC

## 2016-05-22 NOTE — Telephone Encounter (Signed)
I spoke with patient.  Issue discussed. She called out from work for shoulder pain >6 times within 1 year which is contrary to her FMLA document. Patient stated she need doctor's note stating she needed to call out from work on 05/14/16 for shoulder pain.   As discussed with her, I was not aware of this particular flare-up as she did not come to see me or go to the Urgent care to get evaluated. Hence I am unable to write her and excuse note. She is advised to seek medical evaluation whenever she has a flare up for proper documentation in the future.  I also explained to her that the nature of her shoulder disease should not warrant flare-up more than 6 times per year unless it is worsening. Hence I recommended reevaluation of her shoulder pain by sport medicine/orthopedic.  She is familiar with sport medicine for this particular concern, hence I will refer her back to them. She agreed with plan and verbalized understanding.

## 2016-05-22 NOTE — Telephone Encounter (Signed)
I just received her note from sport medicine. I will complete her FMLA form. Will also try to call her for follow up tomorrow.

## 2016-05-22 NOTE — Progress Notes (Signed)
  Kathryn Hendricks - 60 y.o. female MRN [id number]  Date of birth: 1956/08/24  SUBJECTIVE:  Including CC & ROS.  Chief Complaint  Patient presents with  . Shoulder Pain    Ms. Cue is a 60 year old female that is presenting with left shoulder pain. This is acute on chronic in nature. 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 She has pain with lying on the affected side. She has taken naproxen and also use Voltaren gel with some benefit. The pain is always worse at the end of the day. She denies any injury or prior surgery to the left shoulder. She denies any injury that was the inciting event. This occurred in a gradual onset.  ROS: No unexpected weight loss, fever, chills, swelling, instability, numbness/tingling, redness, otherwise see HPI   HISTORY: Past Medical, Surgical, Social, and Family History Reviewed & Updated per EMR.   Pertinent Historical Findings include: PMSHx -  Diastolic HF, ADD, appendectomy, BTL  PSHx -  Denies any tobacco or alcohol use  FHx -  HTN, arthritis  Medications - albuterol, Adderall, lisinopril, zocor, naproxen   DATA REVIEWED: 01/31/2016: left shoulder x-ray: AC joint with degenerative changes.   PHYSICAL EXAM:  VS: BP:137/63  HR: bpm  TEMP: ( )  RESP:   HT:5\' 4"  (162.6 cm)   WT:149 lb (67.6 kg)  BMI:25.6 PHYSICAL EXAM: Gen: NAD, alert, cooperative with exam,  HEENT: clear conjunctiva, EOMI CV:  no edema, capillary refill brisk,  Resp: non-labored, normal speech Skin: no rashes, normal turgor  Neuro: no gross deficits.  Psych:  alert and oriented Left Shoulder: Inspection reveals no abnormalities, atrophy or asymmetry. Tenderness with palpation over AC joint or bicipital groove. Limited active range of motion in flexion and abduction to 90. Full range of motion in abduction and flexion with passive range of motion Rotator cuff strength normal throughout. Empty  can was reproduction of some pain. Pain with Speeds and Yergason's tests   Limited ultrasound: Left shoulder: Biceps tendon was viewed and long and short axis and showed a target sign. This is suggestive of hypoechoic effusion. Subscapularis and supraspinatus reviewed and found to be normal. The subscapularis and supraspinatus were viewed and dynamic testing and did not display any impingement. Infraspinatus and the posterior labral capsule reviewed and found to be normal. Findings are suggestive of an intra-articular labral pathology.  ASSESSMENT & PLAN:   Left shoulder pain Ultrasound is revealing for hypoechoic change of the biceps tendon. Biceps tendon itself appears to be intact. The fluid could be coming from the shoulder from a labral tear. Possible that she could have BT subluxation.  She reports having injections in the past that have not really helped. She wants to continue taking oral medications and try Voltaren gel. The ultrasound also displayed arthritic changes within the acromioclavicular joint. - Continue naproxen - Voltaren gel sent - Provided with home modalities - Can consider an intra-articular injection vs BT injection versus acromioclavicular joint injection if she's not having any improvement in the future. - could consider formal PT if not improving.

## 2016-05-22 NOTE — Telephone Encounter (Signed)
Spoke with pt and she states that she went to sports medicine this afternoon. Pts stated she was told to go over to sports medicine by Dr. Gwendlyn Deutscher. Pt states Dr. Gwendlyn Deutscher would then fill out her FMLA per sports med report. I did not have her schedule an apt with you at this time, wasn't sure since she was seen at sports med.. I will call her to schedule if need be. Please advise.

## 2016-05-22 NOTE — Telephone Encounter (Signed)
Patient came to office request call from Dr. Gwendlyn Deutscher. Patient's phone [phone].

## 2016-05-22 NOTE — Patient Instructions (Signed)
Thank you for coming in,   You can rub the voltaren gel over the [facility] joint to help with the pain.   Please follow up with me in three weeks if you want to try an injection.    Please feel free to call with any questions or concerns at any time, at [phone]. --Dr. Raeford Razor

## 2016-05-22 NOTE — Telephone Encounter (Signed)
Patient was seen today with sports medicine and the note will be in EPIC. Kentarius Partington,CMA

## 2016-05-22 NOTE — Telephone Encounter (Signed)
Please review the telephone notes below. Referral placed to sport medicine. She need to schedule appointment for follow up for new evaluation. If sport medicine evaluation suggests worsening of tendinitis them FMLA may be completed. I need note from sport medicine. Alternative will be to get an MRI. Please help her schedule appointment with sport medicine and follow up with me soon.

## 2016-05-22 NOTE — Assessment & Plan Note (Addendum)
Ultrasound is revealing for hypoechoic change of the biceps tendon. Biceps tendon itself appears to be intact. The fluid could be coming from the shoulder from a labral tear. Possible that she could have BT subluxation.  She reports having injections in the past that have not really helped. She wants to continue taking oral medications and try Voltaren gel. The ultrasound also displayed arthritic changes within the acromioclavicular joint. - Continue naproxen - Voltaren gel sent - Provided with home modalities - Can consider an intra-articular injection vs BT injection versus acromioclavicular joint injection if she's not having any improvement in the future. - could consider formal PT if not improving.

## 2016-05-23 ENCOUNTER — Encounter: Payer: Self-pay | Admitting: Family Medicine

## 2016-05-23 NOTE — Progress Notes (Signed)
Patient is aware that FMLA is complete and faxed to [employer].  Copy of FMLA placed in to be scan box.  Original copy placed up front for pick up.  Derl Barrow, RN

## 2016-05-23 NOTE — Patient Instructions (Signed)
FMLA for completed. She is having more frequent flare of her bicep tendinitis. Sport medicine evaluation reviewed. Her FMLA for adjusted to having flare-ups 1-6 times or more per year. Form given to [place].

## 2016-05-29 ENCOUNTER — Telehealth: Payer: Self-pay

## 2016-05-29 ENCOUNTER — Telehealth: Payer: Self-pay | Admitting: Family Medicine

## 2016-05-29 NOTE — Telephone Encounter (Signed)
Will forward to MD to advise. Jazmin Hartsell,CMA  

## 2016-05-29 NOTE — Telephone Encounter (Signed)
Patient called asking what next step is for her to be able to schedule surgery. I read her urology note and told her it looked like Dr. McDiarmid recommended urodynamics. She said they told her the test was expensive costing around $3000.  She does not believe she can afford this. She asked if he would be willing to do surgery on her without that test.  I called and left message with his assistance Claiborne Billings and she will check with him and call me back.

## 2016-05-29 NOTE — Telephone Encounter (Signed)
Pt is calling because she needs the [facility] paperwork fixed. She said that the shoulder issue should be monthly instead of yearly, She said that we also need to include a letter of excuse for 05/14/16 since she was out of work that day. The company  faxed over the paperwork again so that the doctor can fix this. jw

## 2016-05-30 NOTE — Telephone Encounter (Signed)
Will complete form when I get it. Also let her know that we did not evaluate her on 05/14/16 so we will not be able to give her this letter. Unless she wants a letter that state that "per patient she was sick on 05/14/16".

## 2016-05-30 NOTE — Telephone Encounter (Signed)
I checked the front office this afternoon again but I couldn't find it. I will look out for it. Thanks.

## 2016-05-30 NOTE — Telephone Encounter (Signed)
Per patient HR just needs to know if the reason why she was out on the 18th, was related to her FMLA or not.  Patient said that paperwork should have come through this morning. Jazmin Hartsell,CMA

## 2016-05-31 NOTE — Telephone Encounter (Signed)
Patient is aware that paperwork has been complete and I will fax them for her and place original up front for her to pick up when she comes on 06-05-16 for her appt. Felicite Zeimet,CMA

## 2016-05-31 NOTE — Telephone Encounter (Signed)
Paper work completed today 05/31/16 and it was handed over to [employer].

## 2016-06-05 ENCOUNTER — Ambulatory Visit (INDEPENDENT_AMBULATORY_CARE_PROVIDER_SITE_OTHER): Payer: 59 | Admitting: Family Medicine

## 2016-06-05 ENCOUNTER — Encounter: Payer: Self-pay | Admitting: Family Medicine

## 2016-06-05 VITALS — BP 138/78 | HR 58 | Temp 98.5°F | Ht 64.0 in | Wt 145.6 lb

## 2016-06-05 DIAGNOSIS — I10 Essential (primary) hypertension: Secondary | ICD-10-CM

## 2016-06-05 DIAGNOSIS — G8929 Other chronic pain: Secondary | ICD-10-CM

## 2016-06-05 DIAGNOSIS — E7800 Pure hypercholesterolemia, unspecified: Secondary | ICD-10-CM

## 2016-06-05 DIAGNOSIS — M25512 Pain in left shoulder: Secondary | ICD-10-CM | POA: Diagnosis not present

## 2016-06-05 DIAGNOSIS — E785 Hyperlipidemia, unspecified: Secondary | ICD-10-CM

## 2016-06-05 DIAGNOSIS — F4320 Adjustment disorder, unspecified: Secondary | ICD-10-CM

## 2016-06-05 DIAGNOSIS — M7522 Bicipital tendinitis, left shoulder: Secondary | ICD-10-CM | POA: Diagnosis not present

## 2016-06-05 DIAGNOSIS — F4321 Adjustment disorder with depressed mood: Secondary | ICD-10-CM

## 2016-06-05 LAB — LIPID PANEL
CHOL/HDL RATIO: 3.4 ratio (ref <=5.0)
CHOLESTEROL: 217 mg/dL — AB (ref 125–200)
HDL: 64 mg/dL (ref >=46)
LDL Cholesterol: 134 mg/dL — ABNORMAL HIGH (ref <130)
TRIGLYCERIDES: 95 mg/dL (ref <150)
VLDL: 19 mg/dL (ref <30)

## 2016-06-05 NOTE — Progress Notes (Signed)
Subjective:     Patient ID: Kathryn Hendricks, female   DOB: 1956/02/20, 60 y.o.   MRN: [id number]  Shoulder Pain   The pain is present in the left shoulder. This is a chronic problem. The current episode started more than 1 year ago (Shoulder pain for more than 6 years). There has been no history of extremity trauma. The problem occurs intermittently. The problem has been waxing and waning. The quality of the pain is described as sharp and aching. The pain is at a severity of 5/10. The pain is moderate. Associated symptoms include an inability to bear weight and joint locking. Pertinent negatives include no fever, joint swelling, numbness or stiffness. The symptoms are aggravated by activity. She has tried NSAIDS for the symptoms. The treatment provided moderate (She had a flare up Sept 18th such that she was unable to go to work.) relief. bicep tendinitis.   HTN/HLD: Here for follow up. She has been compliant with her Lisinopril. She has not been taking her Zocor. She thought she didn't need to take it anymore. Grief reaction: Patient stated this is the month her sister passed away last year and she is starting to think about her. She feels down but not overly depressed. She stated she tries to be active to help get her mind off it. Denies any suicidal ideation. Doing well in general.  Current Outpatient Prescriptions on File Prior to Visit  Medication Sig Dispense Refill  . diclofenac sodium (VOLTAREN) 1 % GEL Apply 2 g topically 4 (four) times daily. 100 g 1  . lisinopril (PRINIVIL,ZESTRIL) 5 MG tablet Take 1 tablet (5 mg total) by mouth daily. 90 tablet 1  . naproxen (EC NAPROSYN) 500 MG EC tablet Take 1 tablet (500 mg total) by mouth 2 (two) times daily as needed. 60 tablet 1  . albuterol (PROVENTIL HFA;VENTOLIN HFA) 108 (90 Base) MCG/ACT inhaler Inhale 2 puffs into the lungs 4 (four) times daily. (Patient not taking: Reported on 06/05/2016) 1 Inhaler 4  . amphetamine-dextroamphetamine (ADDERALL) 10  MG tablet Take 1 tablet (10 mg total) by mouth daily with breakfast. (Patient not taking: Reported on 06/05/2016) 30 tablet 0  . aspirin 81 MG EC tablet Take 1 tablet (81 mg total) by mouth daily. (Patient not taking: Reported on 06/05/2016) 90 tablet 1  . loratadine (CLARITIN) 10 MG tablet Take 10 mg by mouth daily. Reported on 02/14/2016    . omeprazole (PRILOSEC) 40 MG capsule Take 1 capsule (40 mg total) by mouth daily. (Patient not taking: Reported on 06/05/2016) 90 capsule 1  . simvastatin (ZOCOR) 40 MG tablet Take 1 tablet (40 mg total) by mouth daily. (Patient not taking: Reported on 06/05/2016) 90 tablet 1   No current facility-administered medications on file prior to visit.    Past Medical History:  Diagnosis Date  . Abdominal pain, left lower quadrant 11/08/2015  . Abnormal Pap smear 2011  . Cognitive decline   . Hyperlipidemia   . Hypertension   . Narcolepsy   . Premature atrial contractions   . PVC's (premature ventricular contractions)    Vitals:   06/05/16 0833  BP: 138/78  Pulse: (!) 58  Temp: 98.5 F (36.9 C)  TempSrc: Oral  Weight: 145 lb 9.6 oz (66 kg)  Height: 5\' 4"  (1.626 m)    Review of Systems  Constitutional: Negative for fever.  Respiratory: Negative.   Cardiovascular: Negative.   Gastrointestinal: Negative.   Musculoskeletal: Positive for arthralgias. Negative for joint swelling, neck stiffness and  stiffness.  Neurological: Negative for numbness.  All other systems reviewed and are negative.      Objective:   Physical Exam  Constitutional: She is oriented to person, place, and time. She appears well-developed. No distress.  Cardiovascular: Normal rate, regular rhythm and normal heart sounds.   No murmur heard. Pulmonary/Chest: Effort normal and breath sounds normal. No respiratory distress. She has no wheezes.  Abdominal: Soft. Bowel sounds are normal. She exhibits no distension and no mass. There is no tenderness.  Musculoskeletal: Normal  range of motion.  Neurological: She is alert and oriented to person, place, and time.  Nursing note and vitals reviewed.      Assessment:     Left Bicep tendinitis HTN HLD Grief    Plan:     Check problem list.

## 2016-06-05 NOTE — Assessment & Plan Note (Signed)
Thinking more about her sister since this is the month she passed away. She is coping well otherwise and not suicidal or homicidal currently. Return precaution discussed. She can call if symptoms worsens. F/U as needed.

## 2016-06-05 NOTE — Assessment & Plan Note (Signed)
BP looks good today on current regimen. Continue same.

## 2016-06-05 NOTE — Assessment & Plan Note (Signed)
FLP ordered today. I will contact her with result and refill her meds if abnormal. She agreed with plan.

## 2016-06-05 NOTE — Patient Instructions (Signed)
Shoulder Pain The shoulder is the joint that connects your arm to your body. Muscles and band-like tissues that connect bones to muscles (tendons) hold the joint together. Shoulder pain is felt if an injury or medical problem affects one or more parts of the shoulder. HOME CARE   Put ice on the sore area.  Put ice in a plastic bag.  Place a towel between your skin and the bag.  Leave the ice on for 15-20 minutes, 03-04 times a day for the first 2 days.  Stop using cold packs if they do not help with the pain.  If you were given something to keep your shoulder from moving (sling; shoulder immobilizer), wear it as told. Only take it off to shower or bathe.  Move your arm as little as possible, but keep your hand moving to prevent puffiness (swelling).  Squeeze a soft ball or foam pad as much as possible to help prevent swelling.  Take medicine as told by your doctor. GET HELP IF:  You have progressing new pain in your arm, hand, or fingers.  Your hand or fingers get cold.  Your medicine does not help lessen your pain. GET HELP RIGHT AWAY IF:   Your arm, hand, or fingers are numb or tingling.  Your arm, hand, or fingers are puffy (swollen), painful, or turn white or blue. MAKE SURE YOU:   Understand these instructions.  Will watch your condition.  Will get help right away if you are not doing well or get worse.   This information is not intended to replace advice given to you by your health care provider. Make sure you discuss any questions you have with your health care provider.   Document Released: 01/30/2008 Document Revised: 09/03/2014 Document Reviewed: 12/06/2014 Elsevier Interactive Patient Education 2016 Elsevier Inc.  

## 2016-06-05 NOTE — Assessment & Plan Note (Signed)
Patient symptoms seems to be worsening. As discussed with her, MRI would better tell us if there is anything serious going on requiring surgery. MRI ordered. Patient referred to PT. Continue NSAID prn. F/U as needed.

## 2016-06-06 ENCOUNTER — Telehealth: Payer: Self-pay | Admitting: Family Medicine

## 2016-06-06 MED ORDER — SIMVASTATIN 40 MG PO TABS: 40.0000 mg | ORAL_TABLET | Freq: Every day | ORAL | 2 refills | Status: DC

## 2016-06-06 MED FILL — SIMVASTATIN 40 MG TABLET: 40 | 90 days supply | Qty: 90 | Fill #0

## 2016-06-06 MED FILL — DICLOFENAC SODIUM 1% GEL: 1 | 12 days supply | Qty: 100 | Fill #0

## 2016-06-06 NOTE — Telephone Encounter (Signed)
I discussed her FLP result. Her 10 yr ASCVD risk is 6% moderate intensity statin recommended. Patient agreed with plan. Refill sent to pharmacy.

## 2016-06-07 NOTE — Telephone Encounter (Signed)
Dr. McDiarmid's nurse called in voice mail. I returned her call and left message for her to call me.

## 2016-06-08 NOTE — Telephone Encounter (Signed)
Kathryn Hendricks at Dr. MCDiarmids's office called. She said she called and talked with patient and explained need for U[facility]. The office will work out payment plan for her and they will get her scheduled. I will wait to hear from the patient or Dr. McDiarmid's office after this to schedule surgery.

## 2016-06-11 ENCOUNTER — Other Ambulatory Visit: Payer: Self-pay | Admitting: Neurology

## 2016-06-11 MED ORDER — AMPHETAMINE-DEXTROAMPHETAMINE 10 MG PO TABS: 10.0000 mg | ORAL_TABLET | Freq: Every day | ORAL | 0 refills | Status: DC

## 2016-06-11 NOTE — Telephone Encounter (Signed)
Pt request refill for amphetamine-dextroamphetamine (ADDERALL) 10 MG tablet °

## 2016-06-12 ENCOUNTER — Telehealth: Payer: Self-pay | Admitting: Family Medicine

## 2016-06-12 NOTE — Telephone Encounter (Signed)
I called and left a detailed message on pt's cell phone (per DPR) advising her that her RX is available for pick up at the front desk. I gave clinic hours/phone number.

## 2016-06-12 NOTE — Telephone Encounter (Signed)
Pt stated Dr. Gwendlyn Deutscher needs to write on the MRI orders that it is for workers comp or pt will have to pay over $600. Pt injured shoulder about five years ago at work. Please advise. Thanks! ep

## 2016-06-13 NOTE — Telephone Encounter (Signed)
I spoke with patient. I am not sure how this will work since we don't do workers comp. I advised her to call HR at work or whoever was in charge of her worker's comp when she had her injury to let them know about the payment. She will get back to me afterwards.

## 2016-06-14 ENCOUNTER — Ambulatory Visit (HOSPITAL_COMMUNITY)
Admission: RE | Admit: 2016-06-14 | Discharge: 2016-06-14 | Disposition: A | Discharge status: Home / Self Care | Payer: 59 | Source: Ambulatory Visit | Attending: Family Medicine | Admitting: Family Medicine

## 2016-06-14 ENCOUNTER — Other Ambulatory Visit: Payer: Self-pay | Admitting: Family Medicine

## 2016-06-14 DIAGNOSIS — M75102 Unspecified rotator cuff tear or rupture of left shoulder, not specified as traumatic: Secondary | ICD-10-CM | POA: Insufficient documentation

## 2016-06-14 DIAGNOSIS — M25512 Pain in left shoulder: Principal | ICD-10-CM

## 2016-06-14 DIAGNOSIS — G8929 Other chronic pain: Secondary | ICD-10-CM

## 2016-06-14 DIAGNOSIS — M7522 Bicipital tendinitis, left shoulder: Secondary | ICD-10-CM | POA: Diagnosis not present

## 2016-06-14 DIAGNOSIS — M75112 Incomplete rotator cuff tear or rupture of left shoulder, not specified as traumatic: Secondary | ICD-10-CM | POA: Diagnosis not present

## 2016-06-15 ENCOUNTER — Telehealth: Payer: Self-pay | Admitting: Family Medicine

## 2016-06-15 DIAGNOSIS — IMO0001 Reserved for inherently not codable concepts without codable children: Secondary | ICD-10-CM

## 2016-06-15 DIAGNOSIS — G8929 Other chronic pain: Secondary | ICD-10-CM

## 2016-06-15 DIAGNOSIS — S46812S Strain of other muscles, fascia and tendons at shoulder and upper arm level, left arm, sequela: Principal | ICD-10-CM

## 2016-06-15 DIAGNOSIS — M25512 Pain in left shoulder: Secondary | ICD-10-CM

## 2016-06-15 NOTE — Telephone Encounter (Signed)
Noted  

## 2016-06-15 NOTE — Addendum Note (Signed)
Addended by: Andrena Mews T on: 06/15/2016 11:07 AM   Modules accepted: Orders

## 2016-06-15 NOTE — Telephone Encounter (Signed)
Patient called me back while I was in the clinic seeing other patients. I discussed her result briefly and promised to call her for more detail.  I called her back at 11:03 am , she did not pick up. Message left to call back.   In case she calls back please let her know that her MRI result was abnormal with high-grade ( Tendon) partial-thickness bursal surface tear of the supraspinatus tendon.  I have placed referral to orthopedic and she should be contacted soon for appointment scheduling.

## 2016-06-15 NOTE — Telephone Encounter (Signed)
Patient came by wanted to let Dr Gwendlyn Deutscher know she had to leave work today early due to shoulder pain.

## 2016-06-15 NOTE — Telephone Encounter (Signed)
Message left to call me back about test result

## 2016-06-15 NOTE — Telephone Encounter (Signed)
Will forward to MD for FYI. Jazmin Hartsell,CMA

## 2016-06-19 ENCOUNTER — Telehealth: Payer: Self-pay

## 2016-06-19 ENCOUNTER — Encounter: Payer: Self-pay | Admitting: Neurology

## 2016-06-19 ENCOUNTER — Telehealth: Payer: Self-pay | Admitting: Family Medicine

## 2016-06-19 ENCOUNTER — Ambulatory Visit (INDEPENDENT_AMBULATORY_CARE_PROVIDER_SITE_OTHER): Payer: 59 | Admitting: Neurology

## 2016-06-19 VITALS — BP 130/88 | HR 64 | Resp 20 | Ht 64.0 in | Wt 149.0 lb

## 2016-06-19 DIAGNOSIS — G47411 Narcolepsy with cataplexy: Secondary | ICD-10-CM

## 2016-06-19 DIAGNOSIS — F988 Other specified behavioral and emotional disorders with onset usually occurring in childhood and adolescence: Secondary | ICD-10-CM | POA: Diagnosis not present

## 2016-06-19 MED ORDER — AMPHETAMINE-DEXTROAMPHETAMINE 10 MG PO TABS: 10.0000 mg | ORAL_TABLET | Freq: Every day | ORAL | 0 refills | Status: DC

## 2016-06-19 MED FILL — DEXTROAMP-AMP 10 MG TAB: 10 | 30 days supply | Qty: 30 | Fill #0

## 2016-06-19 NOTE — Progress Notes (Signed)
I agree with the assessment and plan as directed Larey Seat, MD

## 2016-06-19 NOTE — Telephone Encounter (Signed)
MRI report discussed with patient. She was referred to [facility]. They apparently already called her to schedule an appointment but she did not pick up. I gave her their phone number to call for an appointment. She verbalized understanding and agreed with plan.

## 2016-06-19 NOTE — Patient Instructions (Signed)
Tasia Catchings have a condition called narcolepsy:   This means, that you have a sleep disorder that manifests with at times severe excessive sleepiness during the day and often with problems with sleep at night. We may have to try different medications that may help you stay awake during the day. Not everything works with everybody the same way. Wake promoting agents include stimulants and non-stimulant type medications. The most common side effects with stimulants are weight loss, insomnia, nervousness, headaches, palpitations, rise in blood pressure, anxiety. Stimulants can be addictive and subject to abuse. Non-stimulant type wake promoting medications include Provigil and Nuvigil, most common side effects include headaches, nervousness, insomnia, hypertension.   In addition there is a medication called Xyrem which has been proven to be very effective in patients with narcolepsy with or without cataplexy. Some patients with narcolepsy report episodes of weakness, such as jaw or facial weakness, legs giving out, feeling wobbly or like "Jell-o", etc. in situations of anxiety, stress, laughter, sudden sadness, surprise, etc., which is called cataplexy. You can also experience episodes of sleep paralysis during which you may feel unable to move upon awakening. Some people experience dreamlike sequences upon awakening or upon drifting off to sleep, called hypnopompic or hypnagogic hallucinations.

## 2016-06-19 NOTE — Progress Notes (Signed)
PATIENT: Kathryn Hendricks DOB: 07/04/1956  REASON FOR VISIT: follow up- narcolepsy, mild memory impairment, ADD HISTORY FROM: patient  HISTORY OF PRESENT ILLNESS:    HISTORY  04/27/15 ([name]): Kathryn Hendricks is a 60 y.o. female , seen here as arevisit from Dr. Gwendlyn Deutscher .The patient was last seen on 01-19-15 .Kathryn Hendricks is a 60 y.o. female seen here as a referral from Dr. Gwendlyn Deutscher for treatment of recently diagnosed Narcolepsy and evaluation of memory concerns. Kathryn Hendricks reports that she had increasing difficulties with sleepiness and felt increasingly fatigued, she was not aware that she had physically exerted herself. Her primary care physician at the time was [name] and he referred her for a sleep study in 2014. She [facility] PS[facility], resulting in a very normal PSG , but with a sleep latency of 3 minutes , a REM latency was of 67.5 minutes.  Her sleep efficiency was 85.3% and her total sleep time 331 minutes. There were no significant apneas or hypopneas noted, and her AHI was 3.8. There were no periodic limb movements noted. Study was deemed valid for an MSLT to follow.  The MSLT documented 5 naps with a mean sleep latency of 0.6 minutes and two naps with sleep REM onsets.  This test is diagnostic for the condition of narcolepsy , the MSLT took place on 08-05-13.   Kathryn Hendricks has endorsed again an Epworth Sleepiness Scale of 24 points at the fatigue severity score of 62 out of a possible 63, points. She had stopped modafinil in January after she felt it had become ineffective and told this to Dr. Halford Chessman , her treating sleep physician, after the fact.  She reported falling asleep and feeling tired all the time again she has been is especially being concerned because she is falling asleep when driving. She has tried not to drive alone. She was advised that she is not safe to drive or operate machinery !! The  patient endorsed no cataplectic attacks. She also stated that the Provigil made her feel short of breath. Just yesterday she fell asleep at the dinner table when her sister was her Guest of on her. She was very embarrassed. She goes to bed between 7 and 8 PM and usually is asleep promptly. She will sleep through until 5-5:30 AM. She will go once to the bathroom at night on average. He may have throat little periods of waking up but usually is able to fall asleep right away again. Her sleep does not feel restorative to her. She has lots of vivid dreams and sometimes dreams of being chased or by the nightmarish content. She has noticed more trouble with her memory but may be fatigue related but could be an independent finding. She feels sometimes clumsy and unsteady when walking. She is upsetting her family since she begun to repeat herself frequently and has been displacing things. She has more trouble with spelling.  I would like to add that I was able to review her sleep studies here in person and an echo- cardiogram performed on 06-16-13 with a normal ejection fraction of 65% at grade 1 diastolic dysfunction and mild L a dilated patient. They have been previously no focal neurologic deficits noted.   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  She frequenlty fell asleep in  school. She was napping daily after school. Life long complaint of excessive daytime Sleepiness. She had a sleep study at the [facility]. Dr . Halford Chessman has followed her.   Interval history from 04-27-15 Chief complaint according to patient : Kathryn Hendricks is here today to discuss the results of an MRI study obtained earlier this summer. She received a call that there were no acute findings but she would like to know what exactly the findings mean. She's also endorsing still a very  high degree of daytime sleepiness, 20-24 points . Within our session today we obtained a Mini-Mental Status Examination .  Kathryn Hendricks's MRI of the brain from 01-27-15 documented an underdeveloped splenium of the corpus callosum, a thin lipoma and periventricular FLAIR hyperintensities which we present most likely developmental changes, and no acute findings were seen. This MRI could not be compared to previous MRIs as none were available. Kathryn Hendricks's question if her learning disabilities could be related to an abnormality of the splenium have to be answered as a cautious possibility, this would not explain her hypersomnia and a discussed with her that her excessive daytime sleepiness is not related to the changes in the brain as noted. Her cognitive difficulties have been with her all her life, and a Mini-Mental Status Examination today to exclude the attention and calculation part. The patient scored 25 out of 30 points. This has been a stable finding she was also able to copy an image, she was able to draw a clock face and she named 14 animals in a fluency test.  Sleep habits are as follows: She reports falling asleep in public places, for example in a restaurant in the waiting room etc. she's not able safely to operate machinery she's not able to safely drive because of her excessive daytime sleepiness. She has been prescribed modafinil as well as a stimulants which have given her a little bit more energy but not having a lasting effect. I would be hesitant to consider this patient a good candidate for Xyrem.  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 The patient works 8 to 16.30 .  06-19-2016 . Kathryn Hendricks is a 60 year old  Afro-american single female with a history of narcolepsy, mild memory impairment and ADD.  There is a suspicion of a life long learning  disability . She lost her sister in late October 2016.  She is currently taking Adderall 10 mg daily- works fairly well. 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  She states that usually by the end of her shift she is exhausted. She states that sometimes when she goes home if she tries to nap she will sleep for hours. She is able to complete all ADLs independently. She will undergo a hysterectomy within the next couple of months, she recently saw her cardiologist and was told that her cardiac function is fine, according to her. Fatigue severity is still very high for this patient, a 59 points, the Epworth sleepiness score is still endorsed at 18 points, this makes driving unsafe for her.  She inquires about FMLA for her job.   REVIEW OF SYSTEMS: Out of a complete 14 system review of symptoms, the patient complains only of the following symptoms, and all other reviewed systems are negative.  ALLERGIES: Allergies  Allergen Reactions  . Penicillins Shortness Of Breath, Itching and Swelling    HOME MEDICATIONS: Outpatient Medications Prior  to Visit  Medication Sig Dispense Refill  . albuterol (PROVENTIL HFA;VENTOLIN HFA) 108 (90 Base) MCG/ACT inhaler Inhale 2 puffs into the lungs 4 (four) times daily. 1 Inhaler 4  . amphetamine-dextroamphetamine (ADDERALL) 10 MG tablet Take 1 tablet (10 mg total) by mouth daily with breakfast. 30 tablet 0  . aspirin 81 MG EC tablet Take 1 tablet (81 mg total) by mouth daily. 90 tablet 1  . diclofenac sodium (VOLTAREN) 1 % GEL Apply 2 g topically 4 (four) times daily. 100 g 1  . lisinopril (PRINIVIL,ZESTRIL) 5 MG tablet Take 1 tablet (5 mg total) by mouth daily. 90 tablet 1  . loratadine (CLARITIN) 10 MG tablet Take 10 mg by mouth daily. Reported on 02/14/2016    . naproxen (EC NAPROSYN) 500 MG EC tablet Take 1 tablet (500 mg total) by mouth 2 (two) times daily as needed. 60 tablet 1  . omeprazole (PRILOSEC) 40 MG capsule Take 1 capsule (40 mg total) by mouth daily. 90 capsule 1  . simvastatin (ZOCOR) 40 MG tablet  Take 1 tablet (40 mg total) by mouth at bedtime. 90 tablet 2   No facility-administered medications prior to visit.     PAST MEDICAL HISTORY: Past Medical History:  Diagnosis Date  . Abdominal pain, left lower quadrant 11/08/2015  . Abnormal Pap smear 2011  . Cognitive decline   . Hyperlipidemia   . Hypertension   . Narcolepsy   . Premature atrial contractions   . PVC's (premature ventricular contractions)     PAST SURGICAL HISTORY: Past Surgical History:  Procedure Laterality Date  . APPENDECTOMY    . COLONOSCOPY    . DILATION AND CURETTAGE OF UTERUS  2010  . HYSTEROSCOPY W/ ENDOMETRIAL ABLATION  2010  . TUBAL LIGATION    . WISDOM TOOTH EXTRACTION      FAMILY HISTORY: Family History  Problem Relation Age of Onset  . Hypertension Mother   . Diabetes Mother   . Stroke Mother   . CAD Mother 15  . Narcolepsy Mother     not diagnosed  . Hypertension Sister   . Cancer Sister     Colon cancer  . Hypertension Brother   . Cancer Brother     throat  . Diabetes Father   . Heart disease Father     SOCIAL HISTORY: Social History   Social History  . Marital status: Divorced    Spouse name: N/A  . Number of children: 3  . Years of education: 12   Occupational History  . [place] History Main Topics  . Smoking status: Never Smoker  . Smokeless tobacco: Never Used  . Alcohol use No  . Drug use: No  . Sexual activity: No     Comment: 1st intercourse 54 yo-5 partners   Other Topics Concern  . Not on file   Social History Narrative   Caffeine 1 cup coffee in am.      PHYSICAL EXAM  Vitals:   06/19/16 0823  BP: 130/88  Pulse: 64  Resp: 20  Weight: 149 lb (67.6 kg)  Height: 5\' 4"  (1.626 m)   Body mass index is 25.58 kg/m.  MMSE - Mini Mental State Exam 06/19/2016 11/03/2015 04/27/2015  Orientation to time 4 5 5   Orientation to Place 5 5 5   Registration 3 3 3   Attention/ Calculation 2 4 3   Recall 2 2 2   Language- name 2  objects 2 2 2   Language- repeat 0 0 0  Language- follow 3 step command 3 2 3   Language- read & follow direction 1 1 1   Write a sentence 0 1 1  Copy design 0 0 0  Total score 22 25 25     Finished High school , had special education classes.   General: The patient is awake, alert and appears not in acute distress. The patient is well groomed. Head: Normocephalic, atraumatic. Neck is supple. Mallampati 3, neck circumference:13 inches  Cardiovascular:  Regular rate and rhythm , without  murmurs or carotid bruit, and without distended neck veins. Respiratory: Lungs are clear to auscultation. Skin:  Without evidence of edema, or rash  Neurologic exam : The patient is awake and alert, oriented to place and time.  Memory subjective described as impaired- There is a reduced  attention span & concentration ability. Speech is fluent without dysarthria, dysphonia or aphasia. Mood and affect are appropriate.  Cranial nerves: Pupils are equal and briskly reactive to light. Extraocular movements  in vertical and horizontal planes intact and without nystagmus. Visual fields by finger perimetry are intact. Hearing to finger rub intact.  Facial sensation intact to fine touch. Facial motor strength is symmetric and tongue and uvula move midline.  Motor exam:   Normal tone and normal muscle bulk and symmetric normal strength in all extremities.  Sensory:  Fine touch, pinprick and vibration were  normal.  Coordination: Rapid alternating movements intact- Finger-to-nose maneuver tested and normal without evidence of ataxia, dysmetria or tremor.  Gait and station: Patient walks without assistive device and is able and assisted stool climb up to the exam table. Strength within normal limits. Stance is stable and normal- Steps are unfragmented. Romberg testing is normal.  Deep tendon reflexes: in the  upper and lower extremities are symmetric and intact. Babinski maneuver response is   downgoing.    DIAGNOSTIC DATA (LABS, IMAGING, TESTING) - I reviewed patient records, labs, notes, testing and imaging myself where available.      Component Value Date/Time   NA 140 04/27/2016 1558   NA 144 01/17/2015 1555   K 4.1 04/27/2016 1558   CL 104 04/27/2016 1558   CO2 31 04/27/2016 1558   GLUCOSE 72 04/27/2016 1558   BUN 11 04/27/2016 1558   BUN 14 01/17/2015 1555   CREATININE 0.68 04/27/2016 1558   CALCIUM 8.9 04/27/2016 1558   PROT 6.2 01/17/2015 1555   ALBUMIN 4.0 01/17/2015 1555   AST 18 01/17/2015 1555   ALT 21 01/17/2015 1555   ALKPHOS 87 01/17/2015 1555   BILITOT <0.2 01/17/2015 1555   GFRNONAA 103 01/17/2015 1555   GFRNONAA >89 09/10/2013 1136   GFRAA 119 01/17/2015 1555   GFRAA >89 09/10/2013 1136        ASSESSMENT AND PLAN 60 y.o. year old female  has a past medical history of Abdominal pain, left lower quadrant (11/08/2015); Abnormal Pap smear (2011); Cognitive decline; Hyperlipidemia; Hypertension; Narcolepsy; Premature atrial contractions; and PVC's (premature ventricular contractions). here for a 25 minute visit , including memory testing.   1.Narcolepsy controlled on Adderall, one a day 10 mg. No HTN resulting.  2. Atrial fibrillation- cardiologist with [facility] - need to discuss her Adderall use with cardiology.  3.Memory impairment, subjective - MMSE, dyslexia.  4. ADD, adult - benefit from Adderall.   Overall the patient is doing well. She'll continue on Adderall 10 mg daily.  The patient's memory has remained stable. We will continue to monitor.  Patient advised that if her depression or angry  outbursts worsen she should let us know.   She will follow-up in 6 months with NP  Hayzlee Mcsorley, MD  06/19/2016, 8:56 AM [facility] [address] [place], [place] [address] [phone]

## 2016-06-19 NOTE — Telephone Encounter (Signed)
Patient called me to see how we can proceed with scheduling her surgery.  She says she thinks her uterus has prolapsed even more. Feeling discomfort and pressure.  She said when she wipes after urination it burns there and then itches for a little while. Also, after washing with soap it burns and then itches. She does not itch at other times of the day/night.  I asked if she got the urodynamics done with Dr. Matilde Sprang and she said that she has appointment for the end of Nov as that was soonest that they could see her.  What to recommend?

## 2016-06-19 NOTE — Telephone Encounter (Signed)
Patient needs to talk with PCP or Nurse about her MRI results if it is possible today because she isn't working. Please, follow up.

## 2016-06-20 NOTE — Telephone Encounter (Signed)
I can proceed with hysterectomy and do a cystocele repair but that may not address her incontinence at all. She may continue to have urine leakage and then would have to have some sort of procedure by urology in the future. My preference would be to have the urologist see her and if they feel urology surgery needed we can do it together and save her separate surgeries. See if we cannot accelerate her appointment with urology.

## 2016-06-20 NOTE — Telephone Encounter (Signed)
Sure, pessary is certainly an option if she wants to go ahead and try this temporarily.

## 2016-06-20 NOTE — Telephone Encounter (Signed)
Patient informed of all this.  She is interested in pessary until she can follow through with urology procedures and coordinate surgery.  She wants to call back for an appointment because she is at work presently.

## 2016-06-20 NOTE — Telephone Encounter (Signed)
It may still be a few mos before we could schedule surgery for her. She says she is pretty uncomfortable. I saw in your note you talked with her about pessary as an option. Could I offer her that option until she can get scheduled for surgery?

## 2016-06-22 ENCOUNTER — Telehealth: Payer: Self-pay | Admitting: Physician Assistant

## 2016-06-22 NOTE — Telephone Encounter (Addendum)
Spoke with Dr. Brett Fairy and reviewed that patient does not have a history of atrial fib. (It is confirmed that this was not a new diagnosis made in the neuro office either.) Dayna Dunn PA-C

## 2016-06-22 NOTE — Telephone Encounter (Signed)
Called [facility] and got Dr. Maureen Chatters on the phone and she and Melina Copa, PA-C discussed.

## 2016-06-22 NOTE — Telephone Encounter (Signed)
Anderson Malta, Dr. Brett Fairy with neurology [place] me this chart of a patient I'd seen in the past. In her recent office note it says "2. Atrial fibrillation- cardiologist with [facility] - need to discuss her Adderall use with cardiology. " This patient does NOT have a history of afib. I sent her a message in Epic to clarify if she had seen this on strips or EKG, but have not got a response. Her office note says "regular rate and rhythm" so I am wondering if this is just an error. Can you please call Dr. Edwena Felty office and put in a message to her nurse to speak with Dr .Brett Fairy to clarify this? Thanks! Dayna Dunn PA-C

## 2016-06-26 ENCOUNTER — Telehealth: Payer: Self-pay | Admitting: Neurology

## 2016-06-26 NOTE — Telephone Encounter (Signed)
Kathryn Hendricks does not have a history of atrial fibrillation, as her cardiology provider pointed out to me- she had some ectopic beats. Corrected diagnosis in chart. CD

## 2016-07-09 ENCOUNTER — Telehealth: Payer: Self-pay | Admitting: Family Medicine

## 2016-07-09 NOTE — Telephone Encounter (Signed)
Patient asks PCP if she already has faxed her FMLA renewal forms. Please, follow up.

## 2016-07-10 ENCOUNTER — Encounter: Payer: Self-pay | Admitting: Family Medicine

## 2016-07-10 DIAGNOSIS — M75100 Unspecified rotator cuff tear or rupture of unspecified shoulder, not specified as traumatic: Secondary | ICD-10-CM | POA: Insufficient documentation

## 2016-07-10 DIAGNOSIS — S46812S Strain of other muscles, fascia and tendons at shoulder and upper arm level, left arm, sequela: Secondary | ICD-10-CM

## 2016-07-10 NOTE — Telephone Encounter (Signed)
LM for patient that forms are completed and she can come by and pick up the original. Duwane Gewirtz,CMA

## 2016-07-10 NOTE — Telephone Encounter (Signed)
Please call to advise patient. Her FMLA was completed and placed at the front office to be faxed.

## 2016-07-12 ENCOUNTER — Encounter (INDEPENDENT_AMBULATORY_CARE_PROVIDER_SITE_OTHER): Payer: Self-pay | Admitting: Orthopedic Surgery

## 2016-07-12 ENCOUNTER — Telehealth: Payer: Self-pay | Admitting: Family Medicine

## 2016-07-12 ENCOUNTER — Ambulatory Visit (INDEPENDENT_AMBULATORY_CARE_PROVIDER_SITE_OTHER): Payer: 59 | Admitting: Orthopedic Surgery

## 2016-07-12 DIAGNOSIS — M75122 Complete rotator cuff tear or rupture of left shoulder, not specified as traumatic: Secondary | ICD-10-CM

## 2016-07-12 DIAGNOSIS — N393 Stress incontinence (female) (male): Secondary | ICD-10-CM | POA: Diagnosis not present

## 2016-07-12 DIAGNOSIS — R350 Frequency of micturition: Secondary | ICD-10-CM | POA: Diagnosis not present

## 2016-07-12 DIAGNOSIS — R351 Nocturia: Secondary | ICD-10-CM | POA: Diagnosis not present

## 2016-07-12 NOTE — Telephone Encounter (Signed)
[place] matrix absence management  form dropped off for at front desk for completion.  Verified that patient section of form has been completed.  Last DOS with PCP was 06/05/16  Placed form in blue team folder to be completed by clinical staff.  Carmina Miller

## 2016-07-13 NOTE — Telephone Encounter (Signed)
This form was completed multiple times and the latest completion was on Monday, 4 days ago. What else do they want. Please call to ask patient if there is something they are needing in particular.

## 2016-07-13 NOTE — Progress Notes (Signed)
Office Visit Note   Patient: Kathryn Hendricks           Date of Birth: 04-15-1956           MRN: [id number] Visit Date: 07/12/2016 Requested by: Kathryn Feil, MD [address] [place],  [address] PCP: Kathryn Mews, MD  Subjective: Chief Complaint  Patient presents with  . Left Shoulder - Pain    HPI [name] is a 60 year old female with left shoulder pain.  She has a history of an on-the-job injury in 2010.  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  Blankets and towels were used to of softened up.  She later lifted that very heavy laundry bag and felt a tearing sensation pain in that left shoulder.  She has seen a physician and had physical therapy.  She's had injections.  He did get better temporarily but now she reports occasional stiffness.  MRI scan ordered and reviewed from October of this year.  It does show mild acromioclavicular joint arthritis and a bony spur formation off of the greater tuberosity as well as at 95% thickness tear of the supraspinatus without retraction.  She takes occasional Tylenol and ibuprofen for the problem.  When she is working it feels weak at times.  She has a hysterectomy scheduled for January and she'll be out of work for 6 weeks after that procedure.              Review of Systems All systems reviewed are negative as they relate to the chief complaint within the history of present illness.  Patient denies  fevers or chills.    Assessment & Plan: Visit Diagnoses:  1. Complete rotator cuff tear of left shoulder     Plan: Impression is left shoulder rotator cuff tear with some mild before meals joint arthritis.  Plan the tear is not complete but it's about 95% complete.  I think she can likely need surgery at some time in the future but because it's not a full-thickness tear and it's not retracted we have the ability to weight.  She has mild weakness on exam but does not have a frozen shoulder.   I like to see her back in the spring after she is recovered from her hysterectomy.  The she's okay to continue work for now but we will need to address this shoulder in the future because it will likely progress over time  Follow-Up Instructions: Return in about 4 months (around 11/09/2016).   Orders:  No orders of the defined types were placed in this encounter.  No orders of the defined types were placed in this encounter.     Procedures: No procedures performed   Clinical Data: No additional findings.  Objective: Vital Signs: LMP  (Exact Date)   Physical Exam  Constitutional: She appears well-developed.  HENT:  Head: Normocephalic.  Eyes: EOM are normal.  Neck: Normal range of motion.  Cardiovascular: Normal rate.   Pulmonary/Chest: Effort normal.  Neurological: She is alert.  Skin: Skin is warm.  Psychiatric: She has a normal mood and affect.    Ortho Exam on exam the patient has full cervical spine range of motion she's lacking about 10 of full flexion on the left compared to the right.  Rotator cuff strength is slightly weaker 5 minus out of 5 to supraspinatus and infraspinatus testing on the left compared to the right.  Mild before meals joint tenderness on  the left no tenderness on the right.  Impingement signs positive on the left negative on the right.  Negative apprehension relocation testing on the left and right.  No other masses lymph adenopathy or skin changes noted in the left shoulder girdle region.  Specialty Comments:  No specialty comments available.  Imaging: No results found.   PMFS History: Patient Active Problem List   Diagnosis Date Noted  . Supraspinatus tendon tear, left, sequela 07/10/2016  . Left shoulder pain 02/14/2016  . Prolapse of female pelvic organs 01/27/2016  . Biceps tendinitis on left 07/11/2015  . Right wrist pain 06/21/2015  . Grief reaction 06/21/2015  . Mild cognitive impairment with memory loss 11/09/2014  . ADD  (attention deficit hyperactivity disorder, inattentive type) 11/09/2014  . Narcolepsy without cataplexy 08/17/2013  . Chronic diastolic heart failure ([place]) 06/30/2013  . HYPERCHOLESTEROLEMIA 07/13/2009  . FIBROIDS, UTERUS 07/02/2009  . HYPERTENSION, BENIGN 09/25/2007   Past Medical History:  Diagnosis Date  . Abdominal pain, left lower quadrant 11/08/2015  . Abnormal Pap smear 2011  . Cognitive decline   . Hyperlipidemia   . Hypertension   . Narcolepsy   . Premature atrial contractions   . PVC's (premature ventricular contractions)     Family History  Problem Relation Age of Onset  . Hypertension Mother   . Diabetes Mother   . Stroke Mother   . CAD Mother 49  . Narcolepsy Mother     not diagnosed  . Hypertension Sister   . Cancer Sister     Colon cancer  . Hypertension Brother   . Cancer Brother     throat  . Diabetes Father   . Heart disease Father     Past Surgical History:  Procedure Laterality Date  . APPENDECTOMY    . COLONOSCOPY    . DILATION AND CURETTAGE OF UTERUS  2010  . HYSTEROSCOPY W/ ENDOMETRIAL ABLATION  2010  . TUBAL LIGATION    . WISDOM TOOTH EXTRACTION     Social History   Occupational History  . [place] History Main Topics  . Smoking status: Never Smoker  . Smokeless tobacco: Never Used  . Alcohol use No  . Drug use: No  . Sexual activity: No     Comment: 1st intercourse 41 yo-5 partners

## 2016-07-13 NOTE — Telephone Encounter (Signed)
Clinical info completed on FMLA form.  Place form in Dr. Macario Golds box for completion.  This is the same form that provider has already completed for patient multiple times.  Kathryn Hendricks, Kathryn Hendricks

## 2016-07-13 NOTE — Telephone Encounter (Signed)
Patient states that she received new forms from [employer] and that her initial forms only lasted 1 year and the end date for them was on 07/12/16.  Ms. Wuertz was confused as to why she needed them filled out all over again, but said that her representative with [employer] informed her of the expiration date.  Patient also wanted to let PCP know that she is supposed to be having a hysterectomy/bladder tacking in the next few months and then surgery on her rotator cuff after she has healed from that.  Will forward to Md to make aware.  Forms are in her box. Jazmin Hartsell,CMA

## 2016-07-14 NOTE — Telephone Encounter (Signed)
I have reviewed the form today on 07/14/16. Please advise patient it seems the submitted copy is mostly information for her to keep or complete.  I completed a whole new FMLA form for her on 07/10/16 and was placed up front to be faxed. Please advise patient to call [employer] and check if the got the new form or if there is something else they are needing.  This form has been completed more than anticipated in the last 1-2 months.  Note I left the form at the front office for her to come pick up whenever she is available.

## 2016-07-16 NOTE — Telephone Encounter (Signed)
Patient is aware of message and plans to call her case manager to check on this. Rya Rausch,CMA

## 2016-07-26 ENCOUNTER — Other Ambulatory Visit: Payer: Self-pay | Admitting: Neurology

## 2016-07-26 DIAGNOSIS — G47411 Narcolepsy with cataplexy: Secondary | ICD-10-CM

## 2016-07-26 DIAGNOSIS — F988 Other specified behavioral and emotional disorders with onset usually occurring in childhood and adolescence: Secondary | ICD-10-CM

## 2016-07-26 MED ORDER — AMPHETAMINE-DEXTROAMPHETAMINE 10 MG PO TABS: 10.0000 mg | ORAL_TABLET | Freq: Every day | ORAL | 0 refills | Status: DC

## 2016-07-26 MED FILL — DEXTROAMP-AMP 10 MG TAB: 10 | 30 days supply | Qty: 30 | Fill #0

## 2016-07-26 MED FILL — LISINOPRIL 5 MG TABLET: 5 | 90 days supply | Qty: 90 | Fill #1

## 2016-07-26 NOTE — Telephone Encounter (Signed)
I spoke to pt and advised her that her RX for adderall is ready for pick up at the front desk. Pt verbalized understanding.

## 2016-07-26 NOTE — Telephone Encounter (Signed)
Patient requesting refill of Adderall. Best call back [phone]

## 2016-07-26 NOTE — Telephone Encounter (Signed)
Patient requesting refill of [phone]

## 2016-07-31 ENCOUNTER — Telehealth: Payer: Self-pay

## 2016-07-31 NOTE — Telephone Encounter (Signed)
I called [facility] and had them send her urodynamics result.  I told patient that the Plan was that she return to see Dr. Wendy Poet. "Keep scheduled office visit".  I told her Dr. Jerilynn Mages would review results and what he needs to do surgery wise with her. At that point he should send surgery order to his scheduler who usually will call me to coordinate case.  She said she did have appointment scheduled with Dr. Jerilynn Mages but completely forgot. She did know she was supposed to talk with him and she will call now and reschedule that appointment.  Copy of urodynamics report placed on Dr. Dorette Grate desk.

## 2016-07-31 NOTE — Telephone Encounter (Signed)
Patient called to see when she can schedule surgery with Dr. Loetta Rough as she has finished the last test at the urologist.

## 2016-08-30 ENCOUNTER — Telehealth: Payer: Self-pay | Admitting: Neurology

## 2016-08-30 DIAGNOSIS — G47411 Narcolepsy with cataplexy: Secondary | ICD-10-CM

## 2016-08-30 DIAGNOSIS — F988 Other specified behavioral and emotional disorders with onset usually occurring in childhood and adolescence: Secondary | ICD-10-CM

## 2016-08-30 MED ORDER — AMPHETAMINE-DEXTROAMPHETAMINE 10 MG PO TABS: 10.0000 mg | ORAL_TABLET | Freq: Every day | ORAL | 0 refills | Status: DC

## 2016-08-30 NOTE — Telephone Encounter (Signed)
Patient requesting refill of amphetamine-dextroamphetamine (ADDERALL) 10 MG tablet. ° ° °

## 2016-08-30 NOTE — Addendum Note (Signed)
Addended by: Lester Albert Lea A on: 08/30/2016 04:52 PM   Modules accepted: Orders

## 2016-08-30 NOTE — Telephone Encounter (Signed)
I called pt to advise her that her RX for adderall is ready for pick up. Pt's VM is not set up yet. Unable to leave a message. If pt returns call, please advise her of this information and clinic hours.

## 2016-08-30 NOTE — Telephone Encounter (Signed)
Patient called office and notified RX for adderall is ready for pick up and we close on Friday's at noon.  Per patient she will pick up on Monday.

## 2016-09-04 ENCOUNTER — Other Ambulatory Visit: Payer: Self-pay | Admitting: Family Medicine

## 2016-09-06 MED FILL — DEXTROAMP-AMP 10 MG TAB: 10 | 30 days supply | Qty: 30 | Fill #0

## 2016-09-25 DIAGNOSIS — H524 Presbyopia: Secondary | ICD-10-CM | POA: Diagnosis not present

## 2016-09-25 DIAGNOSIS — H52223 Regular astigmatism, bilateral: Secondary | ICD-10-CM | POA: Diagnosis not present

## 2016-09-28 MED FILL — SIMVASTATIN 40 MG TABLET: 40 | 90 days supply | Qty: 90 | Fill #1

## 2016-10-23 ENCOUNTER — Telehealth: Payer: Self-pay | Admitting: Neurology

## 2016-10-23 DIAGNOSIS — G47411 Narcolepsy with cataplexy: Secondary | ICD-10-CM

## 2016-10-23 DIAGNOSIS — F988 Other specified behavioral and emotional disorders with onset usually occurring in childhood and adolescence: Secondary | ICD-10-CM

## 2016-10-23 MED ORDER — AMPHETAMINE-DEXTROAMPHETAMINE 10 MG PO TABS: 10.0000 mg | ORAL_TABLET | Freq: Every day | ORAL | 0 refills | Status: DC

## 2016-10-23 NOTE — Addendum Note (Signed)
Addended by: Laurence Spates on: 10/23/2016 12:00 PM   Modules accepted: Orders

## 2016-10-23 NOTE — Telephone Encounter (Signed)
Patient called office requesting a refill for amphetamine-dextroamphetamine (ADDERALL) 10 MG tablet.

## 2016-10-23 NOTE — Telephone Encounter (Signed)
Rx printed, waiting for signature, once signed I will take to the front desk for pick up.

## 2016-10-24 NOTE — Telephone Encounter (Signed)
I spoke to patient and let her know that her Rx is at front desk ready for p/u

## 2016-10-25 MED FILL — DEXTROAMP-AMPHETAMIN 10 MG: 10 | 30 days supply | Qty: 30 | Fill #0

## 2016-11-01 DIAGNOSIS — N8111 Cystocele, midline: Secondary | ICD-10-CM | POA: Diagnosis not present

## 2016-11-01 DIAGNOSIS — N393 Stress incontinence (female) (male): Secondary | ICD-10-CM | POA: Diagnosis not present

## 2016-11-06 ENCOUNTER — Telehealth: Payer: Self-pay

## 2016-11-06 MED FILL — LISINOPRIL 5 MG TABLET: 5 | 90 days supply | Qty: 90 | Fill #0

## 2016-11-06 NOTE — Telephone Encounter (Signed)
Message I received from Abigail Butts "Patient saw urologist last week and was told she doesn't need surgery. Gave her meds to help. TF supposed to be doing hysterectomy but was waiting to see if she needed surgery on the bladder. Patient wants to schedule her surgery."

## 2016-11-07 NOTE — Telephone Encounter (Signed)
Left message to call.

## 2016-11-07 NOTE — Telephone Encounter (Signed)
Office note received from Dr. Bernerd Limbo says that he would do a cystocele and vault suspension if we decided to proceed with surgery. It did not appear that he was fine with doing no surgery and I would not want to do a hysterectomy alone without considering the other repairs.

## 2016-11-12 ENCOUNTER — Other Ambulatory Visit: Payer: Self-pay | Admitting: Family Medicine

## 2016-11-12 DIAGNOSIS — Z1231 Encounter for screening mammogram for malignant neoplasm of breast: Secondary | ICD-10-CM

## 2016-11-15 NOTE — Telephone Encounter (Signed)
Patient advised that Dr. Loetta Rough recommended he would not want to do hysterectomy without her having her cystocele repair.  Patient insists that Dr. Matilde Sprang told her it was not the important that she have surgery to fix it. She said he put her on medication and she goes back to see in 4-6 weeks.   She said to tell you she is having a lot of pressure with walking around and also more frequent bowel movements. She said she wants hysterectomy ASAP but if you feel like she must have the urology surgery at same time she will try to hang on. I warned her it will likel  Be June before we can coordinate a case between the 2 MD's.

## 2016-11-16 NOTE — Telephone Encounter (Signed)
Call Dr. MacDermid's office and relay what patient said about not doing cystocele and see what he says.  My fear is I will do the hysterectomy and she will still have pressure and something hanging out of her vagina which is the cystocele

## 2016-11-16 NOTE — Telephone Encounter (Signed)
Left message with Dr. Mikle Bosworth nurse.

## 2016-11-22 ENCOUNTER — Telehealth: Payer: Self-pay

## 2016-11-22 NOTE — Telephone Encounter (Signed)
I called patient to let her know I heard from Dr. McDiarmid's nurse and he does not want her to have hysterectomy without having cystocele repair. She told me his nurse had called her and explained the same thing.  We discussed ins benefits and her estimated surgery prepymt to [facility] due by one week before surgery. She says she is ready to schedule. I explained I am scheduling major cases in June now and have block days opened. She is fine with that. I will contact urology about coordinating surgery.  Dr. Margarito Liner slip from last June for [facility]. Ok to schedule from that. Also, I will have her go for cardiac clearance again per your note on previous surgery slip.

## 2016-11-22 NOTE — Telephone Encounter (Signed)
Okay for [facility], BSO

## 2016-11-22 NOTE — Telephone Encounter (Signed)
Left message for Dr. McDiarmid's surgery scheduler to call me when she can.

## 2016-12-04 ENCOUNTER — Encounter: Payer: Self-pay | Admitting: Physician Assistant

## 2016-12-04 ENCOUNTER — Telehealth: Payer: Self-pay | Admitting: Neurology

## 2016-12-04 ENCOUNTER — Ambulatory Visit (INDEPENDENT_AMBULATORY_CARE_PROVIDER_SITE_OTHER): Payer: 59 | Admitting: Physician Assistant

## 2016-12-04 VITALS — BP 132/76 | HR 60 | Ht 64.0 in | Wt 145.0 lb

## 2016-12-04 DIAGNOSIS — I493 Ventricular premature depolarization: Secondary | ICD-10-CM | POA: Diagnosis not present

## 2016-12-04 DIAGNOSIS — I10 Essential (primary) hypertension: Secondary | ICD-10-CM | POA: Diagnosis not present

## 2016-12-04 DIAGNOSIS — I5032 Chronic diastolic (congestive) heart failure: Secondary | ICD-10-CM | POA: Diagnosis not present

## 2016-12-04 DIAGNOSIS — F988 Other specified behavioral and emotional disorders with onset usually occurring in childhood and adolescence: Secondary | ICD-10-CM

## 2016-12-04 DIAGNOSIS — G47411 Narcolepsy with cataplexy: Secondary | ICD-10-CM

## 2016-12-04 MED ORDER — AMPHETAMINE-DEXTROAMPHETAMINE 10 MG PO TABS: 10.0000 mg | ORAL_TABLET | Freq: Every day | ORAL | 0 refills | Status: DC

## 2016-12-04 NOTE — Telephone Encounter (Signed)
Rx due, patient up to date on appts. Needs f/u appt.

## 2016-12-04 NOTE — Patient Instructions (Addendum)
Medication Instructions:  Your physician recommends that you continue on your current medications as directed. Please refer to the Current Medication list given to you today.   Labwork: NONE ORDERED   Testing/Procedures: NONE ORDERED   Follow-Up: DR. Angelena Form IN 1 YEAR; WE WILL SEND OUT A REMINDER LETTER IN ABOUT 9 MONTHS TO HAVE YOU CALL  AND MAKE AN APPT.   Any Other Special Instructions Will Be Listed Below (If Applicable). If you need a refill on your cardiac medications before your next appointment, please call your pharmacy.  YOU HAVE BEEN DIAGNOSED WITH CHRONIC DIASTOLIC HEART FAILURE Heart Failure Heart failure means your heart has trouble pumping blood. This makes it hard for your body to work well. Heart failure is usually a long-term (chronic) condition. You must take good care of yourself and follow your doctor's treatment plan. Follow these instructions at home:  Take your heart medicine as told by your doctor.  Do not stop taking medicine unless your doctor tells you to.  Do not skip any dose of medicine.  Refill your medicines before they run out.  Take other medicines only as told by your doctor or pharmacist.  Stay active if told by your doctor. The elderly and people with severe heart failure should talk with a doctor about physical activity.  Eat heart-healthy foods. Choose foods that are without trans fat and are low in saturated fat, cholesterol, and salt (sodium). This includes fresh or frozen fruits and vegetables, fish, lean meats, fat-free or low-fat dairy foods, whole grains, and high-fiber foods. Lentils and dried peas and beans (legumes) are also good choices.  Limit salt if told by your doctor.  Cook in a healthy way. Roast, grill, broil, bake, poach, steam, or stir-fry foods.  Limit fluids as told by your doctor.  Weigh yourself every morning. Do this after you pee (urinate) and before you eat breakfast. Write down your weight to give to your  doctor.  Take your blood pressure and write it down if your doctor tells you to.  Ask your doctor how to check your pulse. Check your pulse as told.  Lose weight if told by your doctor.  Stop smoking or chewing tobacco. Do not use gum or patches that help you quit without your doctor's approval.  Schedule and go to doctor visits as told.  Nonpregnant women should have no more than 1 drink a day. Men should have no more than 2 drinks a day. Talk to your doctor about drinking alcohol.  Stop illegal drug use.  Stay current with shots (immunizations).  Manage your health conditions as told by your doctor.  Learn to manage your stress.  Rest when you are tired.  If it is really hot outside:  Avoid intense activities.  Use air conditioning or fans, or get in a cooler place.  Avoid caffeine and alcohol.  Wear loose-fitting, lightweight, and light-colored clothing.  If it is really cold outside:  Avoid intense activities.  Layer your clothing.  Wear mittens or gloves, a hat, and a scarf when going outside.  Avoid alcohol.  Learn about heart failure and get support as needed.  Get help to maintain or improve your quality of life and your ability to care for yourself as needed. Contact a doctor if:  You gain weight quickly.  You are more short of breath than usual.  You cannot do your normal activities.  You tire easily.  You cough more than normal, especially with activity.  You have any  or more puffiness (swelling) in areas such as your hands, feet, ankles, or belly (abdomen).  You cannot sleep because it is hard to breathe.  You feel like your heart is beating fast (palpitations).  You get dizzy or light-headed when you stand up. Get help right away if:  You have trouble breathing.  There is a change in mental status, such as becoming less alert or not being able to focus.  You have chest pain or discomfort.  You faint. This information is not  intended to replace advice given to you by your health care provider. Make sure you discuss any questions you have with your health care provider. Document Released: 05/22/2008 Document Revised: 01/19/2016 Document Reviewed: 09/29/2012 Elsevier Interactive Patient Education  2017 [employer].

## 2016-12-04 NOTE — Telephone Encounter (Signed)
Patient requesting refill of amphetamine-dextroamphetamine (ADDERALL) 10 MG tablet. ° ° °

## 2016-12-04 NOTE — Progress Notes (Signed)
Cardiology Office Note    Date:  12/04/2016   ID:  Kathryn, Hendricks June 09, 1956, MRN [id number]  PCP:  Andrena Mews, MD  Cardiologist: Dr. Angelena Form  No chief complaint on file.   History of Present Illness:  Kathryn Hendricks is a 61 y.o. female female with history of HTN, HLD, GERD, narcolepsy, PACs, PVCs.2D echo 2014: EF 60-70%, grade 1 DD. She was first seen in 2015 by Dr. Angelena Form to discuss dyspnea and palpitations while taking Ritalin. 48 hour holter showed NSR with rare PACs and PVCs. She underwent ETT but did not reach target heart rate on her stress test. Exercised 103min, HR 59->115, no ST changes. Dr. Angelena Form recommended a Leane Call but the patient declined. 48 hour monitor showed sinus with rare PACs and rare PVCs  Patient was last seen in our office 05/2016 for preoperative clearance. 2-D echo showed normal systolic function with grade 1 DD. Nuclear stress test was normal LVEF 69%.  Patient comes in today for routine follow-up. She denies any chest pain. She occasionally gets short of breath when she exerts herself and thinks her heart may be skipping a beat. This isn't often. Her biggest complaint is narcolepsy and she ran out of her Adderall. She also has questions about diastolic heart failure which she was called about after her last echo.  Past Medical History:  Diagnosis Date  . Abdominal pain, left lower quadrant 11/08/2015  . Abnormal Pap smear 2011  . Cognitive decline   . Hyperlipidemia   . Hypertension   . Narcolepsy   . Premature atrial contractions   . PVC's (premature ventricular contractions)     Past Surgical History:  Procedure Laterality Date  . APPENDECTOMY    . COLONOSCOPY    . DILATION AND CURETTAGE OF UTERUS  2010  . HYSTEROSCOPY W/ ENDOMETRIAL ABLATION  2010  . TUBAL LIGATION    . WISDOM TOOTH EXTRACTION      Current Medications: Outpatient Medications Prior to Visit  Medication Sig Dispense Refill  . albuterol (PROVENTIL  HFA;VENTOLIN HFA) 108 (90 Base) MCG/ACT inhaler Inhale 2 puffs into the lungs 4 (four) times daily. 1 Inhaler 4  . amphetamine-dextroamphetamine (ADDERALL) 10 MG tablet Take 1 tablet (10 mg total) by mouth daily with breakfast. 30 tablet 0  . aspirin 81 MG EC tablet Take 1 tablet (81 mg total) by mouth daily. 90 tablet 1  . diclofenac sodium (VOLTAREN) 1 % GEL Apply 2 g topically 4 (four) times daily. 100 g 1  . lisinopril (PRINIVIL,ZESTRIL) 5 MG tablet TAKE 1 TABLET (5 MG TOTAL) BY MOUTH DAILY. 90 tablet 1  . loratadine (CLARITIN) 10 MG tablet Take 10 mg by mouth daily. Reported on 02/14/2016    . naproxen (EC NAPROSYN) 500 MG EC tablet Take 1 tablet (500 mg total) by mouth 2 (two) times daily as needed. 60 tablet 1  . omeprazole (PRILOSEC) 40 MG capsule Take 1 capsule (40 mg total) by mouth daily. 90 capsule 1  . simvastatin (ZOCOR) 40 MG tablet Take 1 tablet (40 mg total) by mouth at bedtime. 90 tablet 2   No facility-administered medications prior to visit.      Allergies:   Penicillins   Social History   Social History  . Marital status: Divorced    Spouse name: N/A  . Number of children: 3  . Years of education: 12   Occupational History  . [employer] History Main Topics  . Smoking status:  Never Smoker  . Smokeless tobacco: Never Used  . Alcohol use No  . Drug use: No  . Sexual activity: No     Comment: 1st intercourse 89 yo-5 partners   Other Topics Concern  . None   Social History Narrative   Caffeine 1 cup coffee in am.     Family History:  The patient's family history includes CAD (age of onset: 45) in her mother; Cancer in her brother and sister; Diabetes in her father and mother; Heart disease in her father; Hypertension in her brother, mother, and sister; Narcolepsy in her mother; Stroke in her mother.   ROS:   Please see the history of present illness.    Review of Systems  Constitution: Negative.  HENT: Negative.   Eyes: Negative.     Cardiovascular: Positive for dyspnea on exertion and palpitations.  Respiratory: Negative.   Hematologic/Lymphatic: Negative.   Musculoskeletal: Negative.  Negative for joint pain.  Gastrointestinal: Negative.   Genitourinary: Negative.   Neurological: Positive for excessive daytime sleepiness.   All other systems reviewed and are negative.   PHYSICAL EXAM:   VS:  BP 132/76 (BP Location: Right Arm, Patient Position: Sitting, Cuff Size: Normal)   Pulse 60   Ht 5\' 4"  (1.626 m)   Wt 145 lb (65.8 kg)   BMI 24.89 kg/m   Physical Exam  GEN: Well nourished, well developed, in no acute distress  Neck: no JVD, carotid bruits, or masses Cardiac:RRR; no murmurs, rubs, or gallops  Respiratory:  clear to auscultation bilaterally, normal work of breathing GI: soft, nontender, nondistended, + BS Ext: without cyanosis, clubbing, or edema, Good distal pulses bilaterally Psych: euthymic mood, full affect  Wt Readings from Last 3 Encounters:  12/04/16 145 lb (65.8 kg)  06/19/16 149 lb (67.6 kg)  06/05/16 145 lb 9.6 oz (66 kg)      Studies/Labs Reviewed:   EKG:  EKG is  ordered today.  The ekg ordered today demonstrates Normal sinus rhythm, normal EKG  Recent Labs: 04/27/2016: BUN 11; Creat 0.68; Potassium 4.1; Sodium 140   Lipid Panel    Component Value Date/Time   CHOL 217 (H) 06/05/2016 0903   TRIG 95 06/05/2016 0903   HDL 64 06/05/2016 0903   CHOLHDL 3.4 06/05/2016 0903   VLDL 19 06/05/2016 0903   LDLCALC 134 (H) 06/05/2016 0903   LDLDIRECT 136 (H) 08/09/2008 2048    Additional studies/ records that were reviewed today include:   Nuclear stress test 04/24/16 Study Highlights   Nuclear stress EF: 69%.  There was no ST segment deviation noted during stress.  The study is normal.  This is a low risk study.  The left ventricular ejection fraction is hyperdynamic (>65%).    2-D echo 04/24/16 Study Conclusions   - Left ventricle: The cavity size was normal. Wall  thickness was   normal. Systolic function was normal. The estimated ejection   fraction was in the range of 55% to 60%. Wall motion was normal;   there were no regional wall motion abnormalities. Doppler   parameters are consistent with abnormal left ventricular   relaxation (grade 1 diastolic dysfunction). - Left atrium: The atrium was mildly dilated. - Pericardium, extracardiac: A trivial pericardial effusion was   identified.   Impressions:   - Normal LV systolic function; probable mild diastolic dysfunction;   mild LAE; trace MR and mild TR.    Arterial Dopplers 04/05/16 without blockage   ASSESSMENT:    1. Chronic diastolic heart failure (  [place])   2. HYPERTENSION, BENIGN   3. PVC (premature ventricular contraction)      PLAN:  In order of problems listed above:  Chronic diastolic CHF compensated without evidence of heart failure. Patient has never had problems with this. Grade 1 DD on 2-D echo. Follow-up with Dr.McAlhany in 1 yr.  Hypertension well controlled on lisinopril  PVCs patient has occasional skipping but overall has been stable. Had rare PACs and PVCs on 48 hour Holter monitor in the past.     Medication Adjustments/Labs and Tests Ordered: Current medicines are reviewed at length with the patient today.  Concerns regarding medicines are outlined above.  Medication changes, Labs and Tests ordered today are listed in the Patient Instructions below. There are no Patient Instructions on file for this visit.   Signed, Ermalinda Barrios, PA-C  12/04/2016 1:19 PM    [facility] [place], [place], [place]  [address] Phone: [phone]; Fax: [phone]

## 2016-12-04 NOTE — Addendum Note (Signed)
Addended by: Laurence Spates on: 12/04/2016 11:50 AM   Modules accepted: Orders

## 2016-12-05 ENCOUNTER — Telehealth: Payer: Self-pay | Admitting: *Deleted

## 2016-12-05 MED FILL — AMPHETAMINE SALTS 10 MG TAB: 10 | 30 days supply | Qty: 30 | Fill #0

## 2016-12-05 NOTE — Telephone Encounter (Signed)
I LM that Rx is at front desk ready for pick up

## 2016-12-06 ENCOUNTER — Other Ambulatory Visit: Payer: Self-pay | Admitting: Urology

## 2016-12-06 NOTE — Telephone Encounter (Signed)
LM for patient to call back and make f/u with Dr. Brett Fairy to discuss further.

## 2016-12-13 DIAGNOSIS — N3946 Mixed incontinence: Secondary | ICD-10-CM | POA: Diagnosis not present

## 2016-12-13 DIAGNOSIS — N8111 Cystocele, midline: Secondary | ICD-10-CM | POA: Diagnosis not present

## 2016-12-17 ENCOUNTER — Ambulatory Visit
Admission: RE | Admit: 2016-12-17 | Discharge: 2016-12-17 | Disposition: A | Discharge status: Home / Self Care | Payer: 59 | Source: Ambulatory Visit | Attending: Family Medicine | Admitting: Family Medicine

## 2016-12-17 DIAGNOSIS — Z1231 Encounter for screening mammogram for malignant neoplasm of breast: Secondary | ICD-10-CM

## 2017-01-07 ENCOUNTER — Other Ambulatory Visit: Payer: Self-pay | Admitting: Neurology

## 2017-01-07 DIAGNOSIS — F988 Other specified behavioral and emotional disorders with onset usually occurring in childhood and adolescence: Secondary | ICD-10-CM

## 2017-01-07 DIAGNOSIS — G47411 Narcolepsy with cataplexy: Secondary | ICD-10-CM

## 2017-01-07 MED ORDER — AMPHETAMINE-DEXTROAMPHETAMINE 10 MG PO TABS: 10.0000 mg | ORAL_TABLET | Freq: Every day | ORAL | 0 refills | Status: DC

## 2017-01-07 NOTE — Telephone Encounter (Signed)
I called pt. I advised her that her RX for adderall is ready for pick up at the front desk. Pt verbalized understanding of this and clinic hours.

## 2017-01-07 NOTE — Addendum Note (Signed)
Addended by: Lester Cheswick A on: 01/07/2017 09:45 AM   Modules accepted: Orders

## 2017-01-07 NOTE — Telephone Encounter (Signed)
Pt calling for refill of amphetamine-dextroamphetamine (ADDERALL) 10 MG tablet °

## 2017-01-08 MED FILL — DEXTROAMP-AMP 10 MG TAB: 10 | 30 days supply | Qty: 30 | Fill #0

## 2017-01-29 ENCOUNTER — Encounter: Payer: Self-pay | Admitting: Gynecology

## 2017-02-04 NOTE — Patient Instructions (Addendum)
Your procedure is scheduled on: Tuesday June 26,2018 at 9 am  Enter through the [place] of [facility] at: 7:30 am  Pick up the phone at the desk and dial [phone].  Call this number if you have problems the morning of surgery: [phone].  Remember: Do NOT eat food or drink any fluids after: Midnight on Monday June 25  Take these medicines the morning of surgery with a SIP OF WATER: omeprazole and claritin if needed.  Bring albuterol inhaler with you on day of surgery.  Do not smoke on the day of surgery.  Stop all herbal medications and supplements at this time.  Do NOT wear jewelry (body piercing), metal hair clips/bobby pins, make-up, or nail polish. Do NOT wear lotions, powders, or perfumes.  You may wear deoderant. Do NOT shave for 48 hours prior to surgery. Do NOT bring valuables to the hospital.  Leave suitcase in car.  After surgery it may be brought to your room.  For patients admitted to the hospital, checkout time is 11:00 AM the day of discharge. Home with daughter Tawni Pummel cell [phone] or daughter Octavia Bruckner cell [phone]

## 2017-02-05 ENCOUNTER — Ambulatory Visit: Payer: 59 | Admitting: Adult Health

## 2017-02-05 ENCOUNTER — Telehealth: Payer: Self-pay

## 2017-02-05 MED FILL — LISINOPRIL 5 MG TABLET: 5 | 90 days supply | Qty: 90 | Fill #1

## 2017-02-05 NOTE — Telephone Encounter (Signed)
I called patient and left message in voice mail per DPR access note on file, that I received her FMLA forms from [employer]. I explained need for signed medical records release form prior to sending them out. I also explained $25 fee for initial set of forms.  I left my direct # if she has any questions.

## 2017-02-07 ENCOUNTER — Encounter (HOSPITAL_COMMUNITY)
Admission: RE | Admit: 2017-02-07 | Discharge: 2017-02-07 | Disposition: A | Discharge status: Home / Self Care | Payer: 59 | Source: Ambulatory Visit | Attending: Gynecology | Admitting: Gynecology

## 2017-02-07 ENCOUNTER — Encounter (HOSPITAL_COMMUNITY): Payer: Self-pay

## 2017-02-07 DIAGNOSIS — Z0289 Encounter for other administrative examinations: Secondary | ICD-10-CM

## 2017-02-07 DIAGNOSIS — D259 Leiomyoma of uterus, unspecified: Secondary | ICD-10-CM | POA: Insufficient documentation

## 2017-02-07 DIAGNOSIS — Z01818 Encounter for other preprocedural examination: Secondary | ICD-10-CM | POA: Insufficient documentation

## 2017-02-07 HISTORY — DX: Gastro-esophageal reflux disease without esophagitis: K21.9

## 2017-02-07 HISTORY — DX: Unspecified osteoarthritis, unspecified site: M19.90

## 2017-02-07 HISTORY — DX: Anxiety disorder, unspecified: F41.9

## 2017-02-07 HISTORY — DX: Bronchitis, not specified as acute or chronic: J40

## 2017-02-07 HISTORY — DX: Chronic diastolic (congestive) heart failure: I50.32

## 2017-02-07 HISTORY — DX: Other seasonal allergic rhinitis: J30.2

## 2017-02-07 LAB — COMPREHENSIVE METABOLIC PANEL
ALK PHOS: 80 U/L (ref 38–126)
ALT: 21 U/L (ref 14–54)
ANION GAP: 5 (ref 5–15)
AST: 21 U/L (ref 15–41)
Albumin: 4 g/dL (ref 3.5–5.0)
BILIRUBIN TOTAL: 0.5 mg/dL (ref 0.3–1.2)
BUN: 16 mg/dL (ref 6–20)
CALCIUM: 9.2 mg/dL (ref 8.9–10.3)
CO2: 30 mmol/L (ref 22–32)
Chloride: 105 mmol/L (ref 101–111)
Creatinine, Ser: 0.66 mg/dL (ref 0.44–1.00)
GFR calc non Af Amer: >60 mL/min (ref >60)
GLUCOSE: 89 mg/dL (ref 65–99)
Potassium: 4 mmol/L (ref 3.5–5.1)
Sodium: 140 mmol/L (ref 135–145)
Total Protein: 7.1 g/dL (ref 6.5–8.1)

## 2017-02-07 LAB — PROTIME-INR
INR: 0.95
PROTHROMBIN TIME: 12.6 s (ref 11.4–15.2)

## 2017-02-07 LAB — CBC
HEMATOCRIT: 36.3 % (ref 36.0–46.0)
HEMOGLOBIN: 11.9 g/dL — AB (ref 12.0–15.0)
MCH: 29.8 pg (ref 26.0–34.0)
MCHC: 32.8 g/dL (ref 30.0–36.0)
MCV: 90.8 fL (ref 78.0–100.0)
Platelets: 224 10*3/uL (ref 150–400)
RBC: 4 MIL/uL (ref 3.87–5.11)
RDW: 13.3 % (ref 11.5–15.5)
WBC: 4.1 10*3/uL (ref 4.0–10.5)

## 2017-02-07 LAB — APTT: aPTT: 27 seconds (ref 24–36)

## 2017-02-07 NOTE — Pre-Procedure Instructions (Signed)
Reviewed medical history/medications/EKG with Dr. Annye Asa.  No orders given.  [place]ac for surgery.

## 2017-02-12 ENCOUNTER — Encounter: Payer: Self-pay | Admitting: Gynecology

## 2017-02-12 ENCOUNTER — Ambulatory Visit (INDEPENDENT_AMBULATORY_CARE_PROVIDER_SITE_OTHER): Payer: 59 | Admitting: Gynecology

## 2017-02-12 ENCOUNTER — Other Ambulatory Visit: Payer: Self-pay | Admitting: Neurology

## 2017-02-12 VITALS — BP 124/80

## 2017-02-12 DIAGNOSIS — N814 Uterovaginal prolapse, unspecified: Secondary | ICD-10-CM | POA: Diagnosis not present

## 2017-02-12 DIAGNOSIS — N8111 Cystocele, midline: Secondary | ICD-10-CM | POA: Diagnosis not present

## 2017-02-12 DIAGNOSIS — F988 Other specified behavioral and emotional disorders with onset usually occurring in childhood and adolescence: Secondary | ICD-10-CM

## 2017-02-12 DIAGNOSIS — G47411 Narcolepsy with cataplexy: Secondary | ICD-10-CM

## 2017-02-12 MED ORDER — AMPHETAMINE-DEXTROAMPHETAMINE 10 MG PO TABS: 10.0000 mg | ORAL_TABLET | Freq: Every day | ORAL | 0 refills | Status: DC

## 2017-02-12 MED FILL — AMPHETAMINE SALTS 10 MG TAB: 10 | 30 days supply | Qty: 30 | Fill #0

## 2017-02-12 MED FILL — SIMVASTATIN 40 MG TABLET: 40 | 90 days supply | Qty: 90 | Fill #0

## 2017-02-12 NOTE — Telephone Encounter (Signed)
Patient called office requesting refill for amphetamine-dextroamphetamine (ADDERALL) 10 MG tablet.

## 2017-02-12 NOTE — H&P (Signed)
Kathryn Hendricks 08-20-1956 [id number]   History and Physical  Chief complaint: Cystocele and uterine prolapse  History of present illness: 61 y.o. [name] with a history over the past 2 years of worsening pelvic prolapse with protrusion of tissue from her vagina. Also with some urinary incontinence for which she has been evaluated by Dr. Matilde Sprang.  Options for management have been reviewed with her to include expectant management, pessary or surgery and the patient elects for surgery and is admitted for TVH, BSO, bladder reparative surgery by Dr. Matilde Sprang  Past medical history,surgical history, medications, allergies, family history and social history were all reviewed and documented in the EPIC chart.  ROS:  Was performed and pertinent positives and negatives are included in the history of present illness.  Exam:  Kathryn Hendricks  assistant Vitals:   02/12/17 1528  BP: 124/80   General: well developed, well nourished female, no acute distress HEENT: normal  Lungs: clear to auscultation without wheezing, rales or rhonchi  Cardiac: regular rate without rubs, murmurs or gallops  Abdomen: soft, nontender without masses, guarding, rebound, organomegaly  Pelvic: external bus vagina:  With atrophic changes. Second-degree cystocele with second degree uterine prolapse. Minimal rectocele Cervix:  With atrophic changes  Uterus: Normal size, midline and mobile, nontender  Adnexa: Without masses or tenderness  Rectovaginal examwith minimal rectocele  Assessment/Plan:  61 y.o. [id number] With symptomatic cystocele and uterine prolapse. Symptoms getting worse over time. Options for management reviewed to include expectant management, pessary and surgery and the patient elects for surgery. Will plan on TVH, BSO and cystocele repair per Dr. Matilde Sprang.  I reviewed with the patient that we will initiate with a TVH and plan on BSO. If I am unable to reach the ovaries due to atrophic changes but they appear  normal visually or palpate normal whether I should proceed with a laparoscopic approach to remove the ovaries or leave them in situ. Risks of ovarian conservation to include benign and ovarian cancer in the future was discussed. Risks of laparoscopic approach as additional risk above the vaginal approach also reviewed. The patient has no issues as far as ovarian symptoms and has no family history of ovarian cancer. After a discussion as to the risks versus benefits the patient prefers that I attempt vaginal removal but if I feel is not safe to proceed with this then to leave the ovaries and she would accept the risks of ovarian conservation in the future. She does understand that if complications arise or I am unable to complete the vaginal hysterectomy that I may approach from a laparoscopic standpoint up to and including a TAH with a larger incision and a longer recovery.  Sexuality following the surgery was also reviewed and the risks of persistent dyspareunia from the incisions, subsequent healing and scar formation as well as the risks of orgasmic dysfunction was discussed understood and accepted. She understands that Dr. Matilde Sprang will be in charge of her cystocele/bladder surgery and that he will follow up with her if there are any issues afterwards pertaining to her bladder.  The expected intraoperative and postoperative courses as well as the recovery period were reviewed. The risks of infection, prolonged antibiotics, reoperation for abscess or hematoma formation was discussed. The risks of hemorrhage necessitating transfusion and the risks of transfusion reaction, hepatitis, HIV, mad cow disease and other unknown entities was also discussed. Incisional complications, if abdominal incisions were made, to include opening and draining of incisions and closure by secondary intention, dehiscence and long-term issues  of keloid/cosmetics and hernia formation were reviewed. The risk of inadvertent injury to  internal organs including bowel, bladder, ureters, vessels, nerves either immediately recognized or delay recognized necessitating major exploratory reparative surgeries and future reparative surgeries including bowel resection, ostomy formation, bladder repair, ureteral damage repair was discussed with her. The patient's questions were answered to her satisfaction and she is ready to proceed with surgery.     Anastasio Auerbach MD, 3:57 PM 02/12/2017

## 2017-02-12 NOTE — Telephone Encounter (Signed)
Rx. up front GNA/fim 

## 2017-02-12 NOTE — Telephone Encounter (Signed)
Rx. awaiting RAS sig/fim 

## 2017-02-12 NOTE — Telephone Encounter (Signed)
I called pt and advised her that her adderall RX is ready for pick up at the front desk. Pt verbalized understanding.

## 2017-02-12 NOTE — Patient Instructions (Signed)
Followup for surgery as scheduled. 

## 2017-02-12 NOTE — Progress Notes (Signed)
Kathryn Hendricks 01/04/1956 [id number]   Preoperative consult  Chief complaint: Cystocele and uterine prolapse  History of present illness: 61 y.o. [id number] with a history over the past 2 years of worsening pelvic prolapse with protrusion of tissue from her vagina. Also with some urinary incontinence for which she has been evaluated by Dr. Matilde Sprang.  Options for management have been reviewed with her to include expectant management, pessary or surgery and the patient elects for surgery and is admitted for TVH, BSO, bladder reparative surgery by Dr. Matilde Sprang  Past medical history,surgical history, medications, allergies, family history and social history were all reviewed and documented in the EPIC chart.  ROS:  Was performed and pertinent positives and negatives are included in the history of present illness.  Exam:  [name]  assistant Vitals:   02/12/17 1528  BP: 124/80   General: well developed, well nourished female, no acute distress HEENT: normal  Lungs: clear to auscultation without wheezing, rales or rhonchi  Cardiac: regular rate without rubs, murmurs or gallops  Abdomen: soft, nontender without masses, guarding, rebound, organomegaly  Pelvic: external bus vagina:  With atrophic changes. Second-degree cystocele with second degree uterine prolapse. Minimal rectocele Cervix:  With atrophic changes  Uterus: Normal size, midline and mobile, nontender  Adnexa: Without masses or tenderness  Rectovaginal examwith minimal rectocele  Assessment/Plan:  61 y.o. [id number] With symptomatic cystocele and uterine prolapse. Symptoms getting worse over time. Options for management reviewed to include expectant management, pessary and surgery and the patient elects for surgery. Will plan on TVH, BSO and cystocele repair per Dr. Matilde Sprang.  I reviewed with the patient that we will initiate with a TVH and plan on BSO. If I am unable to reach the ovaries due to atrophic changes but they appear  normal visually or palpate normal whether I should proceed with a laparoscopic approach to remove the ovaries or leave them in situ. Risks of ovarian conservation to include benign and ovarian cancer in the future was discussed. Risks of laparoscopic approach as additional risk above the vaginal approach also reviewed. The patient has no issues as far as ovarian symptoms and has no family history of ovarian cancer. After a discussion as to the risks versus benefits the patient prefers that I attempt vaginal removal but if I feel is not safe to proceed with this then to leave the ovaries and she would accept the risks of ovarian conservation in the future. She does understand that if complications arise or I am unable to complete the vaginal hysterectomy that I may approach from a laparoscopic standpoint up to and including a TAH with a larger incision and a longer recovery.  Sexuality following the surgery was also reviewed and the risks of persistent dyspareunia from the incisions, subsequent healing and scar formation as well as the risks of orgasmic dysfunction was discussed understood and accepted. She understands that Dr. Matilde Sprang will be in charge of her cystocele/bladder surgery and that he will follow up with her if there are any issues afterwards pertaining to her bladder.  The expected intraoperative and postoperative courses as well as the recovery period were reviewed. The risks of infection, prolonged antibiotics, reoperation for abscess or hematoma formation was discussed. The risks of hemorrhage necessitating transfusion and the risks of transfusion reaction, hepatitis, HIV, mad cow disease and other unknown entities was also discussed. Incisional complications, if abdominal incisions were made, to include opening and draining of incisions and closure by secondary intention, dehiscence and long-term issues of  keloid/cosmetics and hernia formation were reviewed. The risk of inadvertent injury to  internal organs including bowel, bladder, ureters, vessels, nerves either immediately recognized or delay recognized necessitating major exploratory reparative surgeries and future reparative surgeries including bowel resection, ostomy formation, bladder repair, ureteral damage repair was discussed with her. The patient's questions were answered to her satisfaction and she is ready to proceed with surgery.   Anastasio Auerbach MD, 3:45 PM 02/12/2017

## 2017-02-12 NOTE — Addendum Note (Signed)
Addended by: France Ravens I on: 02/12/2017 01:46 PM   Modules accepted: Orders

## 2017-02-13 ENCOUNTER — Telehealth: Payer: Self-pay

## 2017-02-13 NOTE — Telephone Encounter (Signed)
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 She feels like all the lifting is increasing the pressure she is feeling from her uterine prolapse. She asked if you would take her out of work with her FMLA until next Tuesday?

## 2017-02-14 NOTE — Telephone Encounter (Signed)
I spoke with patient and let her know leave can start today. Advised her to call [employer] and talk with them first and be sure everything is in order before she leaves work so she is not penalized.  I will wait to hear from [employer] regarding additional FMLA form or revising dates on the one I have already filled out.

## 2017-02-14 NOTE — Telephone Encounter (Signed)
Okay 

## 2017-02-18 ENCOUNTER — Telehealth: Payer: Self-pay

## 2017-02-18 MED ORDER — CIPROFLOXACIN IN D5W 400 MG/200ML IV SOLN
400.0000 mg | INTRAVENOUS | Status: DC
  Filled 2017-02-18: qty 200

## 2017-02-18 NOTE — H&P (Signed)
CC/HPI: I was consult at by the above provider to assist the patient's prolapse and incontinence is worsened over many months. She feels vaginal bulging and on reviewing the medical records her uterus has dropped and she is planning a hysterectomy. She can feel vaginal bulging and its uncomfortable to try to reduce   She has mild leaking with coughing sneezing and sometimes with urgency but not bending and lifting. She normally does not wear a pad. Sometimes she has mild enuresis   She voids every every 3 hours is out once a night reports a reasonable flow   on pelvic examination the patient had a small grade 3 cystocele with central defect. Her uterus distended 3-4 cm but she could not do a good Valsalva or cough. She had mild hyper mobility of the bladder neck. She had a very mild positive cough test at cystoscopy with the cystocele reduced. She had a grade 1 rectocele in the mid vaginal vault.   If she ever had surgery she would likely best benefit from a transvaginal hysterectomy and vault suspension and cystocele repair and graft. She would be consented for rectocele repair but in my opinion she likely does not need one.   Today  Frequency and incontinence are stable  On urodynamics the patient was catheterized for 50 mL. The patient's bladder capacity was 370 mL. Bladder is unstable reaching pressures of approximately 20 cm of water associated with a moderate amount of leakage. At 200 mL her Valsalva leak point pressure was 37 cm of water with mildly case. She had no leakage prior to reduction of the cystocele. Her coughs were weak. During voluntary voiding she voided 321 mL with a maximum flow of 18 mils per second. Maximum voiding pressure was 20 cm water. She emptied efficiently. EMG activity increased during the voiding phase. Bladder neck descended 3 cm and fluoroscopically showed a cystocele. The details of the urodynamics are signed dictated   a picture was drawn. Watchful waiting versus  surgery versus a pessary was discussed. I discussed a transvaginal full suspension with cystocele repair and graft at this time a hysterectomy.   I discussed watchful waiting versus medical therapy versus sling for her mild stress incontinence documented   The patient is not troubled by her prolapse. She actually wants for worsening urge incontinence to be treated. I started her on behavioral therapy and Vesicare 5 mg samples and prescription. Recently she's been having some urgent mild fecal incontinence of perhaps the medications may help as well. She will follow-up with her gastroenterologist or primary care doctor otherwise. I will mention physical therapy next time   I will send a note to Dr. Phineas Real to keep him posted   ALLERGIES: Penicillin   MEDICATIONS: None   GU PSH: Complex cystometrogram, w/ void pressure and urethral pressure profile studies, any technique - 07/12/2016 Complex Uroflow - 07/12/2016 Cystoscopy - 04/19/2016 Emg surf Electrd - 07/12/2016 Inject For cystogram - 07/12/2016 Intrabd voidng Press - 07/12/2016   NON-GU PSH: None   GU PMH: Cystocele, midline - 04/19/2016 Nocturia - 04/19/2016 Stress Incontinence, M/F - 04/19/2016 Urinary Frequency - 04/19/2016     PMH Notes: tuberculosis   NON-GU PMH: Anxiety Arthritis Cardiac murmur, unspecified Depression Heart failure, unspecified Heartburn Hypercholesterolemia Personal history of other diseases of the circulatory system   FAMILY HISTORY: 1 son - Other 12 daughters - Other Cancer - Sister, Brother father deceased at age 74 - Other mother deceased at age 15 - Other   SOCIAL HISTORY:  Marital Status: Single Current Smoking Status: Patient has never smoked.  Has never drank.  Does not drink caffeine.   REVIEW OF SYSTEMS:    GU Review Female:   Patient reports frequent urination, leakage of urine, and stream starts and stops. Patient denies hard to postpone urination, burning /pain with urination, get up  at night to urinate, trouble starting your stream, have to strain to urinate, and currently pregnant.  Gastrointestinal (Upper):   Patient denies nausea, vomiting, and indigestion/ heartburn.  Gastrointestinal (Lower):   Patient denies diarrhea and constipation.  Constitutional:   Patient reports night sweats and weight loss. Patient denies fever and fatigue.  Skin:   Patient denies skin rash/ lesion and itching.  Eyes:   Patient reports blurred vision. Patient denies double vision.  Ears/ Nose/ Throat:   Patient reports sinus problems. Patient denies sore throat.  Hematologic/Lymphatic:   Patient reports easy bruising. Patient denies swollen glands.  Cardiovascular:   Patient denies leg swelling and chest pains.  Respiratory:   Patient reports cough and shortness of breath.   Endocrine:   Patient denies excessive thirst.  Musculoskeletal:   Patient reports back pain and joint pain.   Neurological:   Patient reports headaches and dizziness.   Psychologic:   Patient denies depression and anxiety.   VITAL SIGNS:      11/01/2016 03:18 PM  BP 145/83 mmHg  Pulse 66 /min  Temperature 98.4 F / 37 C   PAST DATA REVIEWED:  Source Of History:  Patient   PROCEDURES:         Urinalysis w/Scope Dipstick Dipstick Cont'd Micro  Color: Yellow Bilirubin: Neg WBC/hpf: NS (Not Seen)  Appearance: Clear Ketones: Neg RBC/hpf: 3 - 10/hpf  Specific Gravity: 1.025 Blood: 1+ Bacteria: Rare (0-9/hpf)  pH: 6.0 Protein: Neg Cystals: NS (Not Seen)  Glucose: Neg Urobilinogen: 1.0 Casts: NS (Not Seen)    Nitrites: Neg Trichomonas: Not Present    Leukocyte Esterase: Neg Mucous: Present      Epithelial Cells: 0 - 5/hpf      Yeast: NS (Not Seen)      Sperm: Not Present   ASSESSMENT:      ICD-10 Details  1 GU:   Cystocele, midline - N81.11   2   Stress Incontinence, M/F - N39.3               Notes:   I drew her a picture and we talked about prolapse surgery in detail. Pros, cons, general surgical and  anesthetic risks, and other options including behavioral therapy, pessaries, and watchful waiting were discussed. She understands that prolapse repairs are successful in 80-85% of cases for prolapse symptoms and can recur anteriorly, posteriorly, and/or apically. She understands that in most cases I use a graft and general risks were discussed. Surgical risks were described but not limited to the discussion of injury to neighboring structures including the bowel (with possible life-threatening sepsis and colostomy), bladder, urethra, vagina (all resulting in further surgery), and ureter (resulting in re-implantation). We talked about injury to nerves/soft tissue leading to debilitating and intractable pelvic, abdominal, and lower extremity pain syndromes and neuropathies. The risks of buttock pain, intractable dyspareunia, and vaginal narrowing and shortening with sequelae were discussed. Bleeding risks, transfusion rates, and infection were discussed. The risk of persistent, de novo, or worsening bladder and/or bowel incontinence/dysfunction was discussed. The need for CIC was described as well the usual post-operative course. The patient understands that she might not reach her treatment goal and  that she might be worse following surgery.   We talked about a sling in detail. Pros, cons, general surgical and anesthetic risks, and other options including behavioral therapy and watchful waiting were discussed. She understands that slings are generally successful in 90% of cases for stress incontinence, 50% for urge incontinence, and that in a small % of cases the incontinence can worsen. The risk of persistent, de novo, or worsening incontinence/dysfunction was discussed. Risks were described but not limited to the discussion of injury to neighboring structures including the bowel (with possible life-threatening sepsis and colostomy), bladder, urethra, vagina (all resulting in further surgery), and ureter  (resulting in re-implantation). We also talked about the risk of retention requiring urethrolysis, extrusion requiring revision, and erosion resulting in further surgery. Bleeding risks and transfusion rates and the risk of infection were discussed. The risk of pelvic and abdominal pain syndromes, dyspareunia, and neuropathies were discussed. The need for CIC was described as well the usual post-operative course. The patient understands that she might not reach her treatment goal and that she might be worse following surgery.   Mesh issues discussed    PLAN:           Medications New Meds: Vesicare 5 mg tablet 1 tablet PO Daily   #30  11 Refill(s)          Schedule Return Visit/Planned Activity: 4-6 Weeks - Office Visit   After a thorough review of the management options for the patient's condition the patient  elected to proceed with surgical therapy as noted above. We have discussed the potential benefits and risks of the procedure, side effects of the proposed treatment, the likelihood of the patient achieving the goals of the procedure, and any potential problems that might occur during the procedure or recuperation. Informed consent has been obtained.

## 2017-02-18 NOTE — Telephone Encounter (Signed)
Arboriculturist at [facility] notified.

## 2017-02-18 NOTE — Telephone Encounter (Signed)
My orders are for gentamicin and clindamycin. I suspect his were for ciprofloxacin only. I would like gentamicin and clindamycin.  I would check with his office. If he wants the ciprofloxacin regardless then canceled the gentamicin but keep the clindamycin 900 mg along with the ciprofloxacin

## 2017-02-18 NOTE — Telephone Encounter (Signed)
Surgery tomorrow. Dr. McDiarmid has orderd Clindamycin/Gentamycin and you have ordered Cipro.  She asked if you would want to just go with what Dr. Jerilynn Mages has ordered or what to do?

## 2017-02-19 ENCOUNTER — Observation Stay (HOSPITAL_COMMUNITY)
Admission: RE | Admit: 2017-02-19 | Discharge: 2017-02-20 | Disposition: A | Discharge status: Home / Self Care | Payer: 59 | Source: Ambulatory Visit | Attending: Gynecology | Admitting: Gynecology

## 2017-02-19 ENCOUNTER — Encounter (HOSPITAL_COMMUNITY): Payer: Self-pay

## 2017-02-19 ENCOUNTER — Ambulatory Visit (HOSPITAL_COMMUNITY): Payer: 59 | Admitting: Anesthesiology

## 2017-02-19 ENCOUNTER — Encounter (HOSPITAL_COMMUNITY)
Admission: RE | Disposition: A | Discharge status: Home / Self Care | Payer: Self-pay | Source: Ambulatory Visit | Attending: Gynecology

## 2017-02-19 DIAGNOSIS — D251 Intramural leiomyoma of uterus: Secondary | ICD-10-CM | POA: Diagnosis not present

## 2017-02-19 DIAGNOSIS — N3946 Mixed incontinence: Secondary | ICD-10-CM | POA: Insufficient documentation

## 2017-02-19 DIAGNOSIS — N8111 Cystocele, midline: Secondary | ICD-10-CM | POA: Diagnosis not present

## 2017-02-19 DIAGNOSIS — Z7982 Long term (current) use of aspirin: Secondary | ICD-10-CM | POA: Diagnosis not present

## 2017-02-19 DIAGNOSIS — Z880 Allergy status to penicillin: Secondary | ICD-10-CM | POA: Insufficient documentation

## 2017-02-19 DIAGNOSIS — N814 Uterovaginal prolapse, unspecified: Secondary | ICD-10-CM | POA: Diagnosis not present

## 2017-02-19 DIAGNOSIS — I5032 Chronic diastolic (congestive) heart failure: Secondary | ICD-10-CM | POA: Diagnosis not present

## 2017-02-19 DIAGNOSIS — Z79899 Other long term (current) drug therapy: Secondary | ICD-10-CM | POA: Diagnosis not present

## 2017-02-19 DIAGNOSIS — F419 Anxiety disorder, unspecified: Secondary | ICD-10-CM | POA: Insufficient documentation

## 2017-02-19 DIAGNOSIS — I10 Essential (primary) hypertension: Secondary | ICD-10-CM | POA: Insufficient documentation

## 2017-02-19 DIAGNOSIS — N816 Rectocele: Secondary | ICD-10-CM | POA: Insufficient documentation

## 2017-02-19 DIAGNOSIS — Z90710 Acquired absence of both cervix and uterus: Secondary | ICD-10-CM | POA: Diagnosis present

## 2017-02-19 DIAGNOSIS — M1990 Unspecified osteoarthritis, unspecified site: Secondary | ICD-10-CM | POA: Insufficient documentation

## 2017-02-19 DIAGNOSIS — G47419 Narcolepsy without cataplexy: Secondary | ICD-10-CM | POA: Insufficient documentation

## 2017-02-19 DIAGNOSIS — K219 Gastro-esophageal reflux disease without esophagitis: Secondary | ICD-10-CM | POA: Diagnosis not present

## 2017-02-19 DIAGNOSIS — D259 Leiomyoma of uterus, unspecified: Secondary | ICD-10-CM | POA: Diagnosis not present

## 2017-02-19 DIAGNOSIS — N72 Inflammatory disease of cervix uteri: Secondary | ICD-10-CM | POA: Diagnosis not present

## 2017-02-19 DIAGNOSIS — I110 Hypertensive heart disease with heart failure: Secondary | ICD-10-CM | POA: Diagnosis not present

## 2017-02-19 HISTORY — PX: CYSTOSCOPY: SHX5120

## 2017-02-19 HISTORY — PX: SALPINGOOPHORECTOMY: SHX82

## 2017-02-19 HISTORY — PX: ANTERIOR AND POSTERIOR REPAIR: SHX5121

## 2017-02-19 HISTORY — PX: VAGINAL HYSTERECTOMY: SHX2639

## 2017-02-19 SURGERY — HYSTERECTOMY, VAGINAL
Anesthesia: General

## 2017-02-19 MED ORDER — DIPHENHYDRAMINE HCL 25 MG PO CAPS: 50.0000 mg | ORAL_CAPSULE | Freq: Four times a day (QID) | ORAL | Status: DC | PRN

## 2017-02-19 MED ORDER — OXYCODONE-ACETAMINOPHEN 5-325 MG PO TABS
1.0000 | ORAL_TABLET | ORAL | Status: DC | PRN
  Administered 2017-02-20: 1 via ORAL
  Filled 2017-02-19: qty 1

## 2017-02-19 MED ORDER — BUPIVACAINE HCL (PF) 0.25 % IJ SOLN
INTRAMUSCULAR | Status: AC
  Filled 2017-02-19: qty 30

## 2017-02-19 MED ORDER — SODIUM CHLORIDE 0.9 % IV SOLN
Freq: Once | INTRAVENOUS | Status: DC
  Filled 2017-02-19: qty 500000

## 2017-02-19 MED ORDER — PROPOFOL 10 MG/ML IV BOLUS
INTRAVENOUS | Status: DC | PRN
  Administered 2017-02-19: 150 mg via INTRAVENOUS

## 2017-02-19 MED ORDER — PHENAZOPYRIDINE HCL 200 MG PO TABS
200.0000 mg | ORAL_TABLET | ORAL | Status: AC
  Administered 2017-02-19: 200 mg via ORAL
  Filled 2017-02-19: qty 1

## 2017-02-19 MED ORDER — ONDANSETRON HCL 4 MG/2ML IJ SOLN
INTRAMUSCULAR | Status: AC
  Filled 2017-02-19: qty 2

## 2017-02-19 MED ORDER — ALBUTEROL SULFATE (2.5 MG/3ML) 0.083% IN NEBU
3.0000 mL | INHALATION_SOLUTION | Freq: Four times a day (QID) | RESPIRATORY_TRACT | Status: DC
  Administered 2017-02-19 – 2017-02-20 (×2): 3 mL via RESPIRATORY_TRACT
  Filled 2017-02-19 (×2): qty 3

## 2017-02-19 MED ORDER — PANTOPRAZOLE SODIUM 40 MG PO TBEC: 40.0000 mg | DELAYED_RELEASE_TABLET | Freq: Every day | ORAL | Status: DC

## 2017-02-19 MED ORDER — ESTRADIOL 0.1 MG/GM VA CREA
TOPICAL_CREAM | VAGINAL | Status: AC
  Filled 2017-02-19: qty 42.5

## 2017-02-19 MED ORDER — GENTAMICIN SULFATE 40 MG/ML IJ SOLN
INTRAVENOUS | Status: AC
  Administered 2017-02-19: 113 mL via INTRAVENOUS
  Filled 2017-02-19: qty 7

## 2017-02-19 MED ORDER — SCOPOLAMINE 1 MG/3DAYS TD PT72
1.0000 | MEDICATED_PATCH | Freq: Once | TRANSDERMAL | Status: DC
  Administered 2017-02-19: 1.5 mg via TRANSDERMAL

## 2017-02-19 MED ORDER — HYDROMORPHONE HCL 1 MG/ML IJ SOLN: 0.2500 mg | INTRAMUSCULAR | Status: DC | PRN

## 2017-02-19 MED ORDER — ONDANSETRON HCL 4 MG/2ML IJ SOLN
4.0000 mg | Freq: Four times a day (QID) | INTRAMUSCULAR | Status: DC | PRN
  Administered 2017-02-19 – 2017-02-20 (×2): 4 mg via INTRAVENOUS
  Filled 2017-02-19 (×2): qty 2

## 2017-02-19 MED ORDER — 0.9 % SODIUM CHLORIDE (POUR BTL) OPTIME
TOPICAL | Status: DC | PRN
  Administered 2017-02-19: 1000 mL

## 2017-02-19 MED ORDER — STERILE WATER FOR IRRIGATION IR SOLN
Status: DC | PRN
  Administered 2017-02-19 (×2): 1000 mL

## 2017-02-19 MED ORDER — METOCLOPRAMIDE HCL 5 MG/ML IJ SOLN
10.0000 mg | Freq: Once | INTRAMUSCULAR | Status: AC
  Administered 2017-02-19: 10 mg via INTRAVENOUS

## 2017-02-19 MED ORDER — SUGAMMADEX SODIUM 200 MG/2ML IV SOLN
INTRAVENOUS | Status: AC
  Filled 2017-02-19: qty 2

## 2017-02-19 MED ORDER — LIDOCAINE HCL (CARDIAC) 20 MG/ML IV SOLN
INTRAVENOUS | Status: AC
  Filled 2017-02-19: qty 5

## 2017-02-19 MED ORDER — MORPHINE SULFATE (PF) 4 MG/ML IV SOLN
1.0000 mg | INTRAVENOUS | Status: DC | PRN
  Administered 2017-02-19: 2 mg via INTRAVENOUS
  Administered 2017-02-19 – 2017-02-20 (×3): 1 mg via INTRAVENOUS
  Filled 2017-02-19 (×4): qty 1

## 2017-02-19 MED ORDER — DEXAMETHASONE SODIUM PHOSPHATE 10 MG/ML IJ SOLN
INTRAMUSCULAR | Status: AC
  Filled 2017-02-19: qty 1

## 2017-02-19 MED ORDER — LIDOCAINE-EPINEPHRINE 1 %-1:100000 IJ SOLN
INTRAMUSCULAR | Status: DC | PRN
  Administered 2017-02-19: 10 mL
  Administered 2017-02-19: 20 mL

## 2017-02-19 MED ORDER — KETOROLAC TROMETHAMINE 30 MG/ML IJ SOLN: 30.0000 mg | Freq: Four times a day (QID) | INTRAMUSCULAR | Status: DC

## 2017-02-19 MED ORDER — ROCURONIUM BROMIDE 100 MG/10ML IV SOLN
INTRAVENOUS | Status: DC | PRN
  Administered 2017-02-19: 50 mg via INTRAVENOUS
  Administered 2017-02-19: 10 mg via INTRAVENOUS

## 2017-02-19 MED ORDER — PROMETHAZINE HCL 25 MG/ML IJ SOLN: 6.2500 mg | INTRAMUSCULAR | Status: DC | PRN

## 2017-02-19 MED ORDER — ONDANSETRON HCL 4 MG/2ML IJ SOLN
INTRAMUSCULAR | Status: DC | PRN
  Administered 2017-02-19: 4 mg via INTRAVENOUS

## 2017-02-19 MED ORDER — DEXAMETHASONE SODIUM PHOSPHATE 10 MG/ML IJ SOLN
INTRAMUSCULAR | Status: DC | PRN
  Administered 2017-02-19: 10 mg via INTRAVENOUS

## 2017-02-19 MED ORDER — DEXTROSE-NACL 5-0.9 % IV SOLN
INTRAVENOUS | Status: DC
  Administered 2017-02-19 – 2017-02-20 (×3): via INTRAVENOUS

## 2017-02-19 MED ORDER — PROPOFOL 10 MG/ML IV BOLUS
INTRAVENOUS | Status: AC
  Filled 2017-02-19: qty 20

## 2017-02-19 MED ORDER — FENTANYL CITRATE (PF) 250 MCG/5ML IJ SOLN
INTRAMUSCULAR | Status: AC
  Filled 2017-02-19: qty 5

## 2017-02-19 MED ORDER — ONDANSETRON HCL 4 MG PO TABS: 4.0000 mg | ORAL_TABLET | Freq: Four times a day (QID) | ORAL | Status: DC | PRN

## 2017-02-19 MED ORDER — LIDOCAINE-EPINEPHRINE 1 %-1:100000 IJ SOLN
INTRAMUSCULAR | Status: AC
  Filled 2017-02-19: qty 1

## 2017-02-19 MED ORDER — LIDOCAINE HCL (CARDIAC) 20 MG/ML IV SOLN
INTRAVENOUS | Status: DC | PRN
  Administered 2017-02-19: 40 mg via INTRAVENOUS

## 2017-02-19 MED ORDER — ESTRADIOL 0.1 MG/GM VA CREA
TOPICAL_CREAM | VAGINAL | Status: AC
Start: 2017-02-19 — End: 2017-02-19
  Filled 2017-02-19: qty 42.5

## 2017-02-19 MED ORDER — MIDAZOLAM HCL 5 MG/5ML IJ SOLN
INTRAMUSCULAR | Status: DC | PRN
  Administered 2017-02-19: 2 mg via INTRAVENOUS

## 2017-02-19 MED ORDER — KETOROLAC TROMETHAMINE 30 MG/ML IJ SOLN: 30.0000 mg | Freq: Once | INTRAMUSCULAR | Status: DC

## 2017-02-19 MED ORDER — MIDAZOLAM HCL 2 MG/2ML IJ SOLN
INTRAMUSCULAR | Status: AC
  Filled 2017-02-19: qty 2

## 2017-02-19 MED ORDER — METOCLOPRAMIDE HCL 5 MG/ML IJ SOLN
INTRAMUSCULAR | Status: AC
  Filled 2017-02-19: qty 2

## 2017-02-19 MED ORDER — FENTANYL CITRATE (PF) 250 MCG/5ML IJ SOLN
INTRAMUSCULAR | Status: DC | PRN
  Administered 2017-02-19: 25 ug via INTRAVENOUS
  Administered 2017-02-19 (×4): 50 ug via INTRAVENOUS
  Administered 2017-02-19: 25 ug via INTRAVENOUS
  Administered 2017-02-19: 100 ug via INTRAVENOUS
  Administered 2017-02-19: 50 ug via INTRAVENOUS

## 2017-02-19 MED ORDER — LACTATED RINGERS IV SOLN
INTRAVENOUS | Status: DC
  Administered 2017-02-19 (×4): via INTRAVENOUS

## 2017-02-19 MED ORDER — SCOPOLAMINE 1 MG/3DAYS TD PT72
MEDICATED_PATCH | TRANSDERMAL | Status: AC
  Administered 2017-02-19: 1.5 mg via TRANSDERMAL
  Filled 2017-02-19: qty 1

## 2017-02-19 MED ORDER — LISINOPRIL 5 MG PO TABS
5.0000 mg | ORAL_TABLET | Freq: Every day | ORAL | Status: DC
  Administered 2017-02-20: 5 mg via ORAL
  Filled 2017-02-19 (×2): qty 1

## 2017-02-19 MED ORDER — SUGAMMADEX SODIUM 200 MG/2ML IV SOLN
INTRAVENOUS | Status: DC | PRN
  Administered 2017-02-19: 200 mg via INTRAVENOUS

## 2017-02-19 SURGICAL SUPPLY — 60 items
BLADE SURG 15 STRL LF C SS BP (BLADE) ×2 IMPLANT
BLADE SURG 15 STRL SS (BLADE) ×1
CANISTER SUCT 3000ML PPV (MISCELLANEOUS) ×6 IMPLANT
CATH FOLEY 2WAY SLVR  5CC 16FR (CATHETERS) ×1
CATH FOLEY 2WAY SLVR 5CC 16FR (CATHETERS) ×2 IMPLANT
CATH ROBINSON RED A/P 16FR (CATHETERS) IMPLANT
CLOTH BEACON ORANGE TIMEOUT ST (SAFETY) ×3 IMPLANT
CONT PATH 16OZ SNAP LID 3702 (MISCELLANEOUS) IMPLANT
CONTAINER PREFILL 10% NBF 60ML (FORM) IMPLANT
COVER LIGHT HANDLE  1/PK (MISCELLANEOUS) ×1
COVER LIGHT HANDLE 1/PK (MISCELLANEOUS) ×2 IMPLANT
DECANTER SPIKE VIAL GLASS SM (MISCELLANEOUS) IMPLANT
DEVICE CAPIO SLIM SINGLE (INSTRUMENTS) IMPLANT
DRAIN PENROSE 1/4X12 LTX (DRAIN) ×3 IMPLANT
DRAPE UNDERBUTTOCKS STRL (DRAPE) ×3 IMPLANT
GAUZE PACKING 2X5 YD STRL (GAUZE/BANDAGES/DRESSINGS) IMPLANT
GAUZE SPONGE 4X4 16PLY XRAY LF (GAUZE/BANDAGES/DRESSINGS) ×9 IMPLANT
GLOVE BIO SURGEON STRL SZ 6.5 (GLOVE) ×6 IMPLANT
GLOVE BIO SURGEON STRL SZ7 (GLOVE) ×12 IMPLANT
GLOVE BIO SURGEON STRL SZ7.5 (GLOVE) ×9 IMPLANT
GLOVE BIO SURGEON STRL SZ8.5 (GLOVE) ×6 IMPLANT
GLOVE BIOGEL PI IND STRL 6.5 (GLOVE) ×4 IMPLANT
GLOVE BIOGEL PI IND STRL 7.0 (GLOVE) ×10 IMPLANT
GLOVE BIOGEL PI INDICATOR 6.5 (GLOVE) ×2
GLOVE BIOGEL PI INDICATOR 7.0 (GLOVE) ×5
GLOVE ECLIPSE 6.0 STRL STRAW (GLOVE) ×3 IMPLANT
GLOVE ECLIPSE 6.5 STRL STRAW (GLOVE) ×12 IMPLANT
GOWN STRL REUS W/TWL LRG LVL3 (GOWN DISPOSABLE) ×24 IMPLANT
NEEDLE HYPO 22GX1.5 SAFETY (NEEDLE) ×3 IMPLANT
NEEDLE MAYO 6 CRC TAPER PT (NEEDLE) IMPLANT
NEEDLE MAYO CATGUT SZ4 (NEEDLE) IMPLANT
NEEDLE SPNL 18GX3.5 QUINCKE PK (NEEDLE) ×3 IMPLANT
NEEDLE SPNL 22GX3.5 QUINCKE BK (NEEDLE) IMPLANT
NS IRRIG 1000ML POUR BTL (IV SOLUTION) ×6 IMPLANT
PACK TRENDGUARD 600 HYBRD PROC (MISCELLANEOUS) ×2 IMPLANT
PACK VAGINAL WOMENS (CUSTOM PROCEDURE TRAY) ×3 IMPLANT
PAD OB MATERNITY 4.3X12.25 (PERSONAL CARE ITEMS) ×3 IMPLANT
PENCIL BUTTON HOLSTER BLD 10FT (ELECTRODE) ×3 IMPLANT
PLUG CATH AND CAP STER (CATHETERS) ×6 IMPLANT
RETRACTOR STAY HOOK 5MM (MISCELLANEOUS) ×6 IMPLANT
SET CYSTO W/LG BORE CLAMP LF (SET/KITS/TRAYS/PACK) ×3 IMPLANT
SHEET LAVH (DRAPES) ×3 IMPLANT
SPONGE LAP 4X18 X RAY DECT (DISPOSABLE) ×3 IMPLANT
SUT CAPIO ETHIBPND (SUTURE) IMPLANT
SUT SILK 2 0 FS (SUTURE) IMPLANT
SUT VIC AB 0 CT1 18XCR BRD8 (SUTURE) ×6 IMPLANT
SUT VIC AB 0 CT1 27 (SUTURE)
SUT VIC AB 0 CT1 27XBRD ANBCTR (SUTURE) IMPLANT
SUT VIC AB 0 CT1 36 (SUTURE) ×3 IMPLANT
SUT VIC AB 0 CT1 8-18 (SUTURE) ×3
SUT VIC AB 2-0 CT1 (SUTURE) ×18 IMPLANT
SUT VIC AB 2-0 SH 27 (SUTURE) ×2
SUT VIC AB 2-0 SH 27XBRD (SUTURE) ×4 IMPLANT
SUT VICRYL 0 TIES 12 18 (SUTURE) ×3 IMPLANT
SUT VICRYL 0 UR6 27IN ABS (SUTURE) IMPLANT
SYR BULB IRRIGATION 50ML (SYRINGE) ×3 IMPLANT
TOWEL OR 17X24 6PK STRL BLUE (TOWEL DISPOSABLE) ×12 IMPLANT
TRAY FOLEY CATH SILVER 14FR (SET/KITS/TRAYS/PACK) ×3 IMPLANT
TRENDGUARD 600 HYBRID PROC PK (MISCELLANEOUS) ×3
TUBING NON-CON 1/4 X 20 CONN (TUBING) ×3 IMPLANT

## 2017-02-19 NOTE — OR Nursing (Signed)
specimen wt 165.7 g

## 2017-02-19 NOTE — Anesthesia Procedure Notes (Signed)
Procedure Name: Intubation Date/Time: 02/19/2017 8:40 AM Performed by: Ignacia Bayley Pre-anesthesia Checklist: Patient identified, Patient being monitored, Timeout performed, Emergency Drugs available and Suction available Patient Re-evaluated:Patient Re-evaluated prior to inductionOxygen Delivery Method: Circle System Utilized Preoxygenation: Pre-oxygenation with 100% oxygen Intubation Type: IV induction Ventilation: Mask ventilation without difficulty Laryngoscope Size: Mac and 3 Grade View: Grade II Tube type: Oral Tube size: 7.0 mm Number of attempts: 1 Airway Equipment and Method: stylet Placement Confirmation: ETT inserted through vocal cords under direct vision,  positive ETCO2 and breath sounds checked- equal and bilateral Secured at: 21 cm Tube secured with: Tape Dental Injury: Teeth and Oropharynx as per pre-operative assessment

## 2017-02-19 NOTE — Progress Notes (Signed)
Dr. Sabra Heck informed about pt's BP of 155/78. No new orders. Told to call back if systolic >159 and/or >53. Will continue to monitor.

## 2017-02-19 NOTE — Progress Notes (Signed)
Patient ID: Kathryn Hendricks, female   DOB: Jun 11, 1956, 61 y.o.   MRN: [id number] In to see Patient.  Resting comfortably.  Afeb, VSS Lungs clear Cardiac rr, w/o rmg Abd soft, minimal tenderness Ext w/o tenderness Abundant clear urine in foley  Results of surgery reviewed with patient and family Continue routine PO care Anticipate discharge in am

## 2017-02-19 NOTE — H&P (Signed)
The patient was examined.  I reviewed the proposed surgery and consent form with the patient.  The dictated history and physical is current and accurate and all questions were answered. The patient is ready to proceed with surgery and has a realistic understanding and expectation for the outcome.   Anastasio Auerbach MD, 8:20 AM 02/19/2017

## 2017-02-19 NOTE — Op Note (Signed)
GLYNNIS GAVEL Mar 15, 1956 [id number]   Post Operative Note   Date of surgery:  02/19/2017  Pre Op Dx:  Cystocele, uterine prolapse  Post Op Dx:  Cystocele, uterine prolapse, leiomyoma  Procedure:  Total vaginal hysterectomy, bilateral salpingo-oophorectomy  Surgeon:  Anastasio Auerbach  Assistant:  Princess Bruins  Anesthesia:  General  EBL:  240 cc  Complications:  None  Specimen:  Uterus, to pathology  Findings: EUA:  External BUS vagina with second-degree cystocele and uterine prolapse. Atrophic changes noted. Cervix grossly normal. Uterus normal size, midline mobile. Adnexa without masses   Operative:  Uterus mildly enlarged with multiple myomas. Right and left ovaries postmenopausal in appearance. Fallopian tubes grossly normal length, caliber and fimbriated ends.  Procedure:  The patient was taken to the operating room, placed in the low dorsal lithotomy position, underwent general anesthesia, received an abdominal /perineal/vaginal preparation per nursing personnel and an indwelling Foley catheter was placed in sterile technique. The timeout was performed by the surgical team. The patient was draped in the usual fashion. The cervix was visualized with a weighted speculum, grasped with a single-tooth tenaculum and the cervical mucosa was circumferentially injected using 1% lidocaine with 1:100,000 epinephrine dilution, 10 cc total.  The cervical mucosa was then sharply incised circumferentially and the paracervical planes were sharply developed. The anterior cul-de-sac was sharply entered without difficulty as was the posterior cul-de-sac and a long weighted speculum was placed. The right and left uterosacral ligaments were identified, clamped, cut and ligated using 0 Vicryl suture and tagged for future reference. The uterus was progressively freed from its attachments through clamping, cutting and ligating of the paracervical and parametrial tissues using 0 Vicryl suture. Both  right and left uterine vessels were identified and ligated using 0 Vicryl suture. There was some difficulty delivering the uterus through the vagina and it was decided to perform multiple myomectomies removing the smaller myomas to collapse the uterus to facilitate delivery. At this point the right and left uterine ovarian pedicles were identified, clamped, cut and ligated using 0 Vicryl suture and the uterus was delivered through the vagina. The left ovary and fallopian tube were then grasped and elevated and the infundibulopelvic ligament and vessels were crossclamped, transected and subsequently ligated using 0 Vicryl suture in a simple stitch followed by a suture ligature. A similar procedure was carried out on the other side. The specimens were all sent to pathology identifying right and left fallopian tubes, right and left ovaries and uterus with accompanying myomas. The longer weighted speculum was replaced with the shorter weighted speculum, the posterior vaginal cuff grasped with an Allis clamp and the intestines were packed from the posterior cul-de-sac using a tagged tail sponge. The posterior vaginal cuff was run from uterosacral ligament to uterosacral ligament using 0 Vicryl suture. At this point Dr. Matilde Sprang was alerted to begin his portion of the procedure and the weighted speculum was removed and the vagina was packed with a tagged tail sponge to await his arrival. After his arrival with removal of the tail sponge, there were several areas that were oozing at the vaginal cuff and left pelvic sidewall and these were ligated using 0 Vicryl suture in interrupted stitch after identified and clamped with right angle clamps. After assuring adequate hemostasis and reviewing the anatomy with Dr Matilde Sprang and after the sponge, needle and instrument count were verified Dr Matilde Sprang assumed responsibility for the patient and began his portion of the cystocele repair.     Anastasio Auerbach MD, 11:19  AM 02/19/2017

## 2017-02-19 NOTE — Transfer of Care (Signed)
Immediate Anesthesia Transfer of Care Note  Patient: Kathryn Hendricks  Procedure(s) Performed: Procedure(s) with comments: HYSTERECTOMY VAGINAL ,  BSO. (N/A) - DO NOT OPEN LAP INSTRUMENTS SALPINGO OOPHORECTOMY (Bilateral) CYSTOSCOPY (N/A) ANTERIOR (CYSTOCELE) (N/A)  Patient Location: PACU  Anesthesia Type:General  Level of Consciousness: sedated  Airway & Oxygen Therapy: Patient Spontanous Breathing and Patient connected to nasal cannula oxygen  Post-op Assessment: Report given to RN and Post -op Vital signs reviewed and stable  Post vital signs: stable  Last Vitals:  Vitals:   02/19/17 0745  BP: (!) 154/95  Pulse: 60  Resp: 16  Temp: 36.8 C    Last Pain:  Vitals:   02/19/17 0745  TempSrc: Oral  PainSc: 4       Patients Stated Pain Goal: 4 (21/97/58 8325)  Complications: No apparent anesthesia complications

## 2017-02-19 NOTE — Anesthesia Procedure Notes (Signed)
Performed by: Zera Markwardt L       

## 2017-02-19 NOTE — Anesthesia Preprocedure Evaluation (Addendum)
Anesthesia Evaluation  Patient identified by MRN, date of birth, ID band Patient awake    Reviewed: Allergy & Precautions, NPO status , Patient's Chart, lab work & pertinent test results  Airway Mallampati: II  TM Distance: >3 FB Neck ROM: Full    Dental  (+) Dental Advisory Given   Pulmonary neg pulmonary ROS,    breath sounds clear to auscultation       Cardiovascular hypertension, Pt. on medications  Rhythm:Regular Rate:Normal     Neuro/Psych Anxiety Narcolepsy    GI/Hepatic Neg liver ROS, GERD  ,  Endo/Other  negative endocrine ROS  Renal/GU negative Renal ROS     Musculoskeletal  (+) Arthritis ,   Abdominal   Peds  Hematology negative hematology ROS (+)   Anesthesia Other Findings   Reproductive/Obstetrics                            Lab Results  Component Value Date   WBC 4.1 02/07/2017   HGB 11.9 (L) 02/07/2017   HCT 36.3 02/07/2017   MCV 90.8 02/07/2017   PLT 224 02/07/2017   Lab Results  Component Value Date   CREATININE 0.66 02/07/2017   BUN 16 02/07/2017   NA 140 02/07/2017   K 4.0 02/07/2017   CL 105 02/07/2017   CO2 30 02/07/2017    Anesthesia Physical Anesthesia Plan  ASA: II  Anesthesia Plan: General   Post-op Pain Management:    Induction: Intravenous  PONV Risk Score and Plan: 4 or greater and Ondansetron, Dexamethasone, Propofol, Midazolam and Scopolamine patch - Pre-op  Airway Management Planned: Oral ETT  Additional Equipment:   Intra-op Plan:   Post-operative Plan: Extubation in OR  Informed Consent: I have reviewed the patients History and Physical, chart, labs and discussed the procedure including the risks, benefits and alternatives for the proposed anesthesia with the patient or authorized representative who has indicated his/her understanding and acceptance.   Dental advisory given  Plan Discussed with: CRNA  Anesthesia Plan  Comments:         Anesthesia Quick Evaluation

## 2017-02-19 NOTE — Op Note (Signed)
Preoperative diagnosis: Cystocele and mild vault prolapse and mild rectocele Postoperative diagnosis: Cystocele and mild vault prolapse small rectocele Surgeon: Dr. Nicki Reaper Nashira Mcglynn Asst.: Debbrah Alar Surgery: Cystocele repair and cystoscopy  The patient has the above diagnoses and consented to the above procedure. Leg positioning was done by gynecology. Preoperative antibiotics were given. At rest the vaginal cuff was very well supported as were the ureteral sacral ligaments. She had a grade 2 cystocele. Posteriorly the posterior vaginal wall was flat. Hemostasis was excellent prior.  The assistant was present and necessary for all steps of the operation described. The assistant played a critical role assisting during the operation  She had a narrow pubic arch and deep vagina and only a modest central defect. 20 mL of a lidocaine epinephrine mixture was used submucosally. With my Allis clamp technique I made a long anterior vaginal wall incision to the proximal urethra. I sharply mobilize the pubocervical fascia from the overlying vaginal wall mucosa to the white line bilaterally. I was very pleased with the mobilization.  I did an anterior repair imbricating the pubocervical fascia with running 2-0 Vicryl suture not imbricating the bladder neck. 2 layers were utilized. The anatomy was not distorted and I maintained good anterior vaginal wall length  I cystoscoped the patient and cystoscopically she had an excellent repair. There was efflux of urine bilaterally. There was no urethral or bladder injury. The patient had been cystoscoped at the beginning of the case with no bladder or ureteral injury post-hysterectomy  I took down my retractor and as noted throughout the case it was obvious the patient did not need a formal vault suspension. I closed the vaginal cuff with 0 Vicryl from left to right and right to left beginning at each apex. I was pleased with the closure  The patient had excellent  length and very good support anteriorly. Posteriorly there was no visible defect. When I did a rectal examination a few centimeters into the mid vaginal vault she had a small area of weakness approximately the third the size of a golfball. The rest of the posterior vaginal wall was very well supported no descensus and no distal defect. I remember her anatomy well in the office and I felt she did not need a rectocele repair.  Vaginal pack with Estrace cream was applied. Leg position was good. She had excellent urine output. Patient was taken to recovery. Hopefully the patient will reacher treatment all

## 2017-02-19 NOTE — Anesthesia Postprocedure Evaluation (Signed)
Anesthesia Post Note  Patient: Kathryn Hendricks  Procedure(s) Performed: Procedure(s) (LRB): HYSTERECTOMY VAGINAL ,  BSO. (N/A) SALPINGO OOPHORECTOMY (Bilateral) CYSTOSCOPY (N/A) ANTERIOR (CYSTOCELE) (N/A)     Patient location during evaluation: PACU Anesthesia Type: General Level of consciousness: awake and alert Pain management: pain level controlled Vital Signs Assessment: post-procedure vital signs reviewed and stable Respiratory status: spontaneous breathing, nonlabored ventilation and respiratory function stable Cardiovascular status: blood pressure returned to baseline and stable Postop Assessment: no signs of nausea or vomiting Anesthetic complications: no    Last Vitals:  Vitals:   02/19/17 1430 02/19/17 1445  BP:  (!) 141/71  Pulse: 64 69  Resp: 12 12  Temp: 36.3 C     Last Pain:  Vitals:   02/19/17 1445  TempSrc:   PainSc: 3    Pain Goal: Patients Stated Pain Goal: 4 (02/19/17 1445)               Lynda Rainwater

## 2017-02-20 ENCOUNTER — Encounter (HOSPITAL_COMMUNITY): Payer: Self-pay | Admitting: Gynecology

## 2017-02-20 DIAGNOSIS — N814 Uterovaginal prolapse, unspecified: Secondary | ICD-10-CM | POA: Diagnosis not present

## 2017-02-20 DIAGNOSIS — I10 Essential (primary) hypertension: Secondary | ICD-10-CM | POA: Diagnosis not present

## 2017-02-20 DIAGNOSIS — K219 Gastro-esophageal reflux disease without esophagitis: Secondary | ICD-10-CM | POA: Diagnosis not present

## 2017-02-20 DIAGNOSIS — D251 Intramural leiomyoma of uterus: Secondary | ICD-10-CM | POA: Diagnosis not present

## 2017-02-20 DIAGNOSIS — N72 Inflammatory disease of cervix uteri: Secondary | ICD-10-CM | POA: Diagnosis not present

## 2017-02-20 DIAGNOSIS — N816 Rectocele: Secondary | ICD-10-CM | POA: Diagnosis not present

## 2017-02-20 DIAGNOSIS — F419 Anxiety disorder, unspecified: Secondary | ICD-10-CM | POA: Diagnosis not present

## 2017-02-20 DIAGNOSIS — N3946 Mixed incontinence: Secondary | ICD-10-CM | POA: Diagnosis not present

## 2017-02-20 DIAGNOSIS — M1990 Unspecified osteoarthritis, unspecified site: Secondary | ICD-10-CM | POA: Diagnosis not present

## 2017-02-20 LAB — CBC
HCT: 31.8 % — ABNORMAL LOW (ref 36.0–46.0)
Hemoglobin: 10.7 g/dL — ABNORMAL LOW (ref 12.0–15.0)
MCH: 30.1 pg (ref 26.0–34.0)
MCHC: 33.6 g/dL (ref 30.0–36.0)
MCV: 89.3 fL (ref 78.0–100.0)
PLATELETS: 191 10*3/uL (ref 150–400)
RBC: 3.56 MIL/uL — AB (ref 3.87–5.11)
RDW: 13.6 % (ref 11.5–15.5)
WBC: 14.2 10*3/uL — AB (ref 4.0–10.5)

## 2017-02-20 MED ORDER — OXYCODONE-ACETAMINOPHEN 5-325 MG PO TABS: 1.0000 | ORAL_TABLET | ORAL | 0 refills | Status: DC | PRN

## 2017-02-20 MED FILL — OXYCODONE W/APAP 5/325 TAB: 5-325 | 3 days supply | Qty: 25 | Fill #0

## 2017-02-20 NOTE — Discharge Instructions (Signed)
°  Postoperative Instructions Hysterectomy ° °Dr. Clorene Nerio and the nursing staff have discussed postoperative instructions with you.  If you have any questions please ask them before you leave the hospital, or call Dr Braylin Formby’s office at [phone].   ° °We would like to emphasize the following instructions: ° ° °  Call the office to make your follow-up appointment as recommended by Dr Sanchez Hemmer (usually 2 weeks). ° °  You were given a prescription, or one was ordered for you at the pharmacy you designated.  Get that prescription filled and take the medication according to instructions. ° °  You may eat a regular diet, but slowly until you start having bowel movements. ° °  Drink plenty of water daily. ° °  Nothing in the vagina (intercourse, douching, objects of any kind) until released by Dr Maloree Uplinger. ° °  No driving for two weeks.  Wait to be cleared by Dr Lloyd Ayo at your first post op check.  Car rides (short) are ok after several days at home, as long as you are not having significant pain, but no traveling out of town. ° °  You may shower, but no baths.  Walking up and down stairs is ok.  No heavy lifting, prolonged standing, repeated bending or any “working out” until your first post op check. ° °  Rest frequently, listen to your body and do not push yourself and overdo it. ° °  Call if: ° °o Your pain medication does not seem strong enough. °o Worsening pain or abdominal bloating °o Persistent nausea or vomiting °o Difficulty with urination or bowel movements. °o Temperature of 101 degrees or higher. °o Bleeding heavier then staining (clots or period type flow). °o Incisions become red, tender or begin to drain. °o You have any questions or concerns. °

## 2017-02-20 NOTE — Discharge Summary (Signed)
Kathryn Hendricks 1956/04/17 [id number]   Discharge Summary  Date of Admission:  02/19/2017  Date of Discharge:  02/20/2017  Discharge Diagnosis:  Leiomyoma, uterine prolapse, cystocele  Procedure:  Procedure(s): HYSTERECTOMY VAGINAL BILATERAL SALPINGO OOPHORECTOMY CYSTOCELE REPAIR CYSTOSCOPY   Pathology:  Uterus, ovaries and fallopian tubes, cervix - CERVIX: MILD CHRONIC INFLAMMATION. - ENDOMETRIUM: ATROPHIC. - MYOMETRIUM: LEIOMYOMATA. - SEROSA: UNREMARKABLE. - BILATERAL ADNEXA: BENIGN OVARIES AND FALLOPIAN TUBES.  Hospital Course:  The patient underwent an uncomplicated TVH, BSO, cystocele repair 02/19/2017. She was discharged on postoperative day #1 tolerating a regular diet, ambulating, voiding without difficulty after having her catheter and vaginal packing removed and having good pain relief with oral medication. The patient received instructions for postoperative care and call precautions.  She received prescriptions per AVS and will be seen in the office 2 weeks following discharge.       Anastasio Auerbach MD, 3:59 PM 02/20/2017

## 2017-02-20 NOTE — Progress Notes (Signed)
Discharge instructions provided, questions answered, pt states understanding, signed and given copy

## 2017-02-20 NOTE — Progress Notes (Signed)
Feeling much better Trial of voiding  PVR was 100 ml Send home when OK with gyne Detailed post op

## 2017-02-20 NOTE — Progress Notes (Signed)
Violia P Hush February 03, 1956 [id number]   1 Day Post-Op Procedure(s) (LRB): HYSTERECTOMY VAGINAL ,  BSO. (N/A) SALPINGO OOPHORECTOMY (Bilateral) CYSTOSCOPY (N/A) ANTERIOR (CYSTOCELE) (N/A)  Subjective: Patient reports no complaints, no acute distress, pain severity reported mild, Yes.   taking PO, foley catheter in place, Yes.   ambulating, No. passing flatus  Objective: Vital signs in last 24 hours: Temp:  [97.3 F (36.3 C)-99.5 F (37.5 C)] 99.3 F (37.4 C) (06/27 0455) Pulse Rate:  [60-73] 62 (06/27 0455) Resp:  [10-100] 16 (06/27 0455) BP: (126-165)/(66-95) 157/78 (06/27 0455) SpO2:  [98 %-100 %] 100 % (06/27 0455) Weight:  [140 lb (63.5 kg)] 140 lb (63.5 kg) (06/26 1445) Last BM Date: 02/18/17    EXAM General: awake, alert and no distress Resp: clear to auscultation bilaterally Cardio: regular rate and rhythm GI: soft, minimal tenderness, bowel sounds present Lower Extremities: Without swelling or tenderness Vaginal Bleeding: Reported scant   Lab Results:   Recent Labs  02/20/17 0541  WBC 14.2*  HGB 10.7*  HCT 31.8*  PLT 191    Assessment: s/p Procedure(s): HYSTERECTOMY VAGINAL ,  BSO. SALPINGO OOPHORECTOMY CYSTOSCOPY ANTERIOR (CYSTOCELE): progressing well, ready for discharge from Gyn standpoint.    Plan: Discharge home today after urology sees patient and approves.  Precautions, instructions and follow up were discussed with the patient.  Prescriptions provided per AVS.  Patient to call the office to arrange a post-operative appointmant in 2 weeks.    Anastasio Auerbach MD, 7:38 AM 02/20/2017

## 2017-02-22 ENCOUNTER — Telehealth: Payer: Self-pay

## 2017-02-22 NOTE — Telephone Encounter (Signed)
Patient called because she has not had BM in 5 days.  She said she called Dr. Mikle Bosworth office yesterday and nurse recommended [place]. She has taken that with no relief. Dr. Phineas Real recommended Dulcolax supp over the weekend and increase fluids. Patient advised.

## 2017-02-26 ENCOUNTER — Encounter: Payer: Self-pay | Admitting: Gynecology

## 2017-02-26 ENCOUNTER — Ambulatory Visit (INDEPENDENT_AMBULATORY_CARE_PROVIDER_SITE_OTHER): Payer: 59 | Admitting: Gynecology

## 2017-02-26 ENCOUNTER — Telehealth: Payer: Self-pay

## 2017-02-26 ENCOUNTER — Other Ambulatory Visit: Payer: Self-pay | Admitting: Gynecology

## 2017-02-26 VITALS — BP 122/78 | Temp 98.4°F

## 2017-02-26 DIAGNOSIS — G8918 Other acute postprocedural pain: Secondary | ICD-10-CM | POA: Diagnosis not present

## 2017-02-26 DIAGNOSIS — Z01812 Encounter for preprocedural laboratory examination: Secondary | ICD-10-CM

## 2017-02-26 LAB — CBC WITH DIFFERENTIAL/PLATELET
Basophils Absolute: 63 cells/uL (ref 0–200)
Basophils Relative: 1 %
EOS PCT: 3 %
Eosinophils Absolute: 189 cells/uL (ref 15–500)
HCT: 36.1 % (ref 35.0–45.0)
HEMOGLOBIN: 11.5 g/dL — AB (ref 11.7–15.5)
LYMPHS ABS: 1764 {cells}/uL (ref 850–3900)
Lymphocytes Relative: 28 %
MCH: 28.6 pg (ref 27.0–33.0)
MCHC: 31.9 g/dL — AB (ref 32.0–36.0)
MCV: 89.8 fL (ref 80.0–100.0)
MONOS PCT: 7 %
MPV: 10.1 fL (ref 7.5–12.5)
Monocytes Absolute: 441 cells/uL (ref 200–950)
NEUTROS PCT: 61 %
Neutro Abs: 3843 cells/uL (ref 1500–7800)
PLATELETS: 277 10*3/uL (ref 140–400)
RBC: 4.02 MIL/uL (ref 3.80–5.10)
RDW: 13.5 % (ref 11.0–15.0)
WBC: 6.3 10*3/uL (ref 3.8–10.8)

## 2017-02-26 MED ORDER — OXYCODONE-ACETAMINOPHEN 2.5-325 MG PO TABS
1.0000 | ORAL_TABLET | ORAL | 0 refills | Status: DC | PRN
Start: 2017-02-26 — End: 2017-04-09

## 2017-02-26 NOTE — Progress Notes (Signed)
    Kathryn Hendricks Apr 24, 1956 [id number]        61 y.o.  [name] presents 1 week postop status post TVH BSO cystocele repair by Dr. Matilde Hendricks.  Awoke last night at 3 AM having some discomfort after slipping down in the bed and took 2 of her oxycodone. She then became very dizzy and noticed some shortness of breath and just did not feel well. No chest pain or coughing loss of consciousness. Notes that overall she is feeling better now not having any shortness of breath but still feeling a little woozy. No fever or chills. Voiding without difficulty. Having bowel movements with last bowel movement yesterday. Eating without nausea or vomiting. She does note though on questioning not drinking a lot of fluids.  Past medical history,surgical history, problem list, medications, allergies, family history and social history were all reviewed and documented in the EPIC chart.  Directed ROS with pertinent positives and negatives documented in the history of present illness/assessment and plan.  Exam: Kathryn Hendricks assistant Vitals:   02/26/17 1408  BP: 122/78  Temp: 98.4 F (36.9 C)  TempSrc: Oral   General appearance:  Normal in no acute distress HEENT normal Lungs clear Cardiac regular rate no rubs murmurs or gallops Abdomen soft nontender without masses guarding rebound Pelvic external BUS vagina with incision lines intact. Minimal appropriate postoperative pain on bimanual.  Assessment/Plan:  61 y.o. [name] with history of pain after shifting in bed taking 2 of her oxycodone been having lightheadedness and dizziness with shortness of breath. Her shortness of breath has resolved. Still feels a little woozy but overall feeling better. Eating, drinking, voiding and having BMs without difficulty. No fever or chills. Exam is unremarkable. I believe that the 2 oxycodone 5/325 were responsible for her symptoms. Also she does not appear to be drinking a lot of fluids. I recommended that she push fluids  such that she is voiding every hour to 2 hours daily. I also wrote her a prescription for oxycodone 2.5/325 #15 that she can take one of these with pain and repeated if needed in an hour so and try to avoid a heavier dosage which I again think is responsible for her symptoms. Will check baseline CBC today. Did not think more concerning issues occurring such as respiratory/PE/cardiac. ASAP call precautions reviewed in detail with the patient. Patient feels comfortable with the plan. She does have an appointment to see me next week but prefers to cancel this and make another appointment in 2 weeks for routine postop visit but will call if any concerning symptoms develop.    Kathryn Auerbach MD, 2:56 PM 02/26/2017

## 2017-02-26 NOTE — Telephone Encounter (Signed)
Patient called back and said she could not be here before 1:00pm. I explained we closed from 1-2:00. We will schedule her at 2pm with Dr. Loetta Rough.  Reminded her she can call 911 if she feels she needs immediate assistance and children not available.

## 2017-02-26 NOTE — Telephone Encounter (Signed)
Patient's daughter called with concerns but I did not have DPR access to speak with her. I asked if I could contact patient. I called patient and spoke with her.  Patient said her Pain got worse around 3:30am and she took 2 Percocet.  She said after that she started to feel dizzy and SOB.  She said the SOB is better now like it is resolving and pain medication helped the increased pain.  She said she is having hot flashes but does not know if she has fever and said she is by herself and afraid to get up to check it.  I explained Dr. Loetta Rough in surgery but may be able to see this between cases. If I do not hear from him within the hour I will call her back with direction.

## 2017-02-26 NOTE — Telephone Encounter (Signed)
I spoke with Kathryn Hendricks to get her opinion on how to handle. She checked with Dr. Dellis Filbert who recommended patient come now to the office and let her assess.  I called patient and advised. Patient said she will need to check with her children to see if someone can bring her. She will call me back to let me know.

## 2017-02-26 NOTE — Patient Instructions (Signed)
Use the milder pain medication for pain if you needed.  Make sure you're drinking plenty of water every day and going to the bathroom every hour to 2 hours to empty your bladder.  Follow up in 2 weeks for your next postoperative visit with Dr. Dellis Filbert

## 2017-02-26 NOTE — Telephone Encounter (Signed)
I spoke with Dr Dellis Filbert regarding this patient as pt is 1 week post op. Dr Dellis Filbert advised Ov to be checked, listen to lungs for SOB and CBC and exam due to increase pain. Juliann Pulse to call pt per recommendations. KW CMA

## 2017-02-28 ENCOUNTER — Telehealth: Payer: Self-pay

## 2017-02-28 DIAGNOSIS — N8111 Cystocele, midline: Secondary | ICD-10-CM | POA: Diagnosis not present

## 2017-02-28 DIAGNOSIS — R350 Frequency of micturition: Secondary | ICD-10-CM | POA: Diagnosis not present

## 2017-02-28 DIAGNOSIS — N390 Urinary tract infection, site not specified: Secondary | ICD-10-CM | POA: Diagnosis not present

## 2017-02-28 DIAGNOSIS — R8271 Bacteriuria: Secondary | ICD-10-CM | POA: Diagnosis not present

## 2017-02-28 NOTE — Telephone Encounter (Signed)
Patient informed. 

## 2017-02-28 NOTE — Telephone Encounter (Signed)
Patient called because while watering her flowers this morning she felt light headed and shortness of breath. She said even with washing dishes she feels SOB.  She questions is this normal for after surgery?

## 2017-02-28 NOTE — Telephone Encounter (Signed)
Sounds like she is overdoing it. Her blood count looked great when I saw her. I would recommend pushing fluids like I told her before into rest little bit more. She does not need to be out watering her flowers at this point.

## 2017-03-04 ENCOUNTER — Ambulatory Visit: Payer: 59 | Admitting: Gynecology

## 2017-03-05 ENCOUNTER — Encounter: Payer: Self-pay | Admitting: Adult Health

## 2017-03-05 ENCOUNTER — Ambulatory Visit (INDEPENDENT_AMBULATORY_CARE_PROVIDER_SITE_OTHER): Payer: 59 | Admitting: Adult Health

## 2017-03-05 VITALS — BP 106/67 | HR 62 | Wt 144.2 lb

## 2017-03-05 DIAGNOSIS — G47419 Narcolepsy without cataplexy: Secondary | ICD-10-CM

## 2017-03-05 DIAGNOSIS — R413 Other amnesia: Secondary | ICD-10-CM

## 2017-03-05 DIAGNOSIS — F988 Other specified behavioral and emotional disorders with onset usually occurring in childhood and adolescence: Secondary | ICD-10-CM | POA: Diagnosis not present

## 2017-03-05 NOTE — Patient Instructions (Addendum)
Your Plan:  Continue Adderall 10 mg at bedtime Speak to OBGYN about mood swings   Thank you for coming to see [place] at [facility]. I hope we have been able to provide you high quality care today.  You may receive a patient satisfaction survey over the next few weeks. We would appreciate your feedback and comments so that we may continue to improve ourselves and the health of our patients.

## 2017-03-05 NOTE — Progress Notes (Signed)
I agree with the assessment and plan as directed by NP .The patient is known to me .   Shyhiem Beeney, MD  

## 2017-03-05 NOTE — Progress Notes (Signed)
PATIENT: Kathryn Hendricks DOB: 1956-01-19  REASON FOR VISIT: follow up- narcolepsy, memory HISTORY FROM: patient  HISTORY OF PRESENT ILLNESS: Today 03/05/17 Kathryn Hendricks is a 61 year old female with a history of narcolepsy and mild memory impairment in ADD. She returns today for follow-up. She reports that she continues to take Adderall 10 mg daily. She does feel that it may not be as effective as it was when she initially started the medication. She says later in the day she is very sleepy. She reports that she does recently had a hysterectomy. She states since then she has noticed increased mood swings. However she does report that she was having some mood swings before the surgery. She does note that she is very forgetful. She currently lives at home alone. She is able to complete all ADLs. She does operate a motor vehicle and occasionally will get lost but is able to correct this. She returns today for an evaluation  HISTORY 06-19-2016 . Kathryn Hendricks is a 62 year old  Afro-american single female with a history of narcolepsy, mild memory impairment and ADD.  There is a suspicion of a life long learning  disability . She lost her sister in late October 2016. She is currently taking Adderall 10 mg daily- works fairly well. 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  She states that usually by the end of her shift she is exhausted. She states that sometimes when she goes home if she tries to nap she will sleep for hours. She is able to complete all ADLs independently. She will undergo a hysterectomy within the next couple of months, she recently saw her cardiologist and was told that her cardiac function is fine, according to her. Fatigue severity is still very high for this patient, a 59 points, the Epworth sleepiness score is still endorsed at 18 points, this makes driving unsafe for her.   REVIEW OF SYSTEMS: Out of a complete 14 system review of symptoms, the patient complains only of the following  symptoms, and all other reviewed systems are negative.  Painful urination, joint pain, shortness of breath, appetite change, activity change, unexpected weight change, excessive sweating, shortness of breath  ALLERGIES: Allergies  Allergen Reactions  . Penicillins Shortness Of Breath, Itching and Swelling    Has patient had a PCN reaction causing immediate rash, facial/tongue/throat swelling, SOB or lightheadedness with hypotension: Yes Has patient had a PCN reaction causing severe rash involving mucus membranes or skin necrosis: No Has patient had a PCN reaction that required hospitalization: No Has patient had a PCN reaction occurring within the last 10 years: No If all of the above answers are "NO", then may proceed with Cephalosporin use.     HOME MEDICATIONS: Outpatient Medications Prior to Visit  Medication Sig Dispense Refill  . acetaminophen (TYLENOL) 325 MG tablet Take 650 mg by mouth every 6 (six) hours as needed for mild pain or headache.    . albuterol (PROVENTIL HFA;VENTOLIN HFA) 108 (90 Base) MCG/ACT inhaler Inhale 2 puffs into the lungs 4 (four) times daily. 1 Inhaler 4  . amphetamine-dextroamphetamine (ADDERALL) 10 MG tablet Take 1 tablet (10 mg total) by mouth daily with breakfast. 30 tablet 0  . aspirin 81 MG EC tablet Take 1 tablet (81 mg total) by mouth daily. 90 tablet 1  . diclofenac sodium (VOLTAREN) 1 % GEL Apply 2 g topically 4 (four) times daily. (Patient taking differently: Apply 2 g topically 4 (four) times daily as needed. ) 100 g 1  .  ibuprofen (ADVIL,MOTRIN) 200 MG tablet Take 400 mg by mouth every 6 (six) hours as needed for headache or moderate pain.    Marland Kitchen lisinopril (PRINIVIL,ZESTRIL) 5 MG tablet TAKE 1 TABLET (5 MG TOTAL) BY MOUTH DAILY. 90 tablet 1  . naproxen (EC NAPROSYN) 500 MG EC tablet Take 1 tablet (500 mg total) by mouth 2 (two) times daily as needed. 60 tablet 1  . omeprazole (PRILOSEC) 40 MG capsule Take 1 capsule (40 mg total) by mouth daily. 90  capsule 1  . simvastatin (ZOCOR) 40 MG tablet Take 1 tablet (40 mg total) by mouth at bedtime. 90 tablet 2  . oxycodone-acetaminophen (PERCOCET) 2.5-325 MG tablet Take 1 tablet by mouth every 4 (four) hours as needed for pain. (Patient not taking: Reported on 03/05/2017) 15 tablet 0   No facility-administered medications prior to visit.     PAST MEDICAL HISTORY: Past Medical History:  Diagnosis Date  . Abdominal pain, left lower quadrant 11/08/2015  . Abnormal Pap smear 2011  . Anxiety   . Arthritis    lower back and shoulders  . Bronchitis    hx - used abluterol inhaler  . Chronic diastolic heart failure ([place])   . Cognitive decline   . GERD (gastroesophageal reflux disease)    occasional  . Hyperlipidemia   . Hypertension   . Narcolepsy   . Premature atrial contractions   . PVC's (premature ventricular contractions)   . Seasonal allergies   . SVD (spontaneous vaginal delivery)    x 3    PAST SURGICAL HISTORY: Past Surgical History:  Procedure Laterality Date  . ANTERIOR AND POSTERIOR REPAIR N/A 02/19/2017   Procedure: ANTERIOR (CYSTOCELE);  Surgeon: Bjorn Loser, MD;  Location: [facility];  Service: Urology;  Laterality: N/A;  . APPENDECTOMY    . COLONOSCOPY    . CYSTOSCOPY N/A 02/19/2017   Procedure: CYSTOSCOPY;  Surgeon: Bjorn Loser, MD;  Location: [facility];  Service: Urology;  Laterality: N/A;  . DILATION AND CURETTAGE OF UTERUS  2010  . HYSTEROSCOPY W/ ENDOMETRIAL ABLATION  2010  . SALPINGOOPHORECTOMY Bilateral 02/19/2017   Procedure: SALPINGO OOPHORECTOMY;  Surgeon: Anastasio Auerbach, MD;  Location: [facility];  Service: Gynecology;  Laterality: Bilateral;  . TUBAL LIGATION    . VAGINAL HYSTERECTOMY N/A 02/19/2017   Procedure: HYSTERECTOMY VAGINAL ,  BSO.;  Surgeon: Anastasio Auerbach, MD;  Location: [facility];  Service: Gynecology;  Laterality: N/A;  DO NOT OPEN LAP INSTRUMENTS  . WISDOM TOOTH EXTRACTION      FAMILY HISTORY: Family History  Problem Relation Age  of Onset  . Hypertension Mother   . Diabetes Mother   . Stroke Mother   . CAD Mother 108  . Narcolepsy Mother        not diagnosed  . Hypertension Sister   . Cancer Sister        Colon cancer  . Hypertension Brother   . Cancer Brother        throat  . Diabetes Father   . Heart disease Father     SOCIAL HISTORY: Social History   Social History  . Marital status: Divorced    Spouse name: N/A  . Number of children: 3  . Years of education: 12   Occupational History  . [place] History Main Topics  . Smoking status: Never Smoker  . Smokeless tobacco: Never Used  . Alcohol use No  . Drug use: No  . Sexual activity: No  Comment: 1st intercourse 14 yo-5 partners   Other Topics Concern  . Not on file   Social History Narrative   Caffeine 1 cup coffee in am.      PHYSICAL EXAM  Vitals:   03/05/17 0834  BP: 106/67  Pulse: 62  Weight: 144 lb 3.2 oz (65.4 kg)   Body mass index is 26.37 kg/m. MMSE - Mini Mental State Exam 03/05/2017 06/19/2016 11/03/2015  Orientation to time 4 4 5   Orientation to Place 5 5 5   Registration 3 3 3   Attention/ Calculation 0 2 4  Recall 1 2 2   Language- name 2 objects 2 2 2   Language- repeat 0 0 0  Language- follow 3 step command 3 3 2   Language- read & follow direction 1 1 1   Write a sentence 0 0 1  Copy design 1 0 0  Total score 20 22 25     Generalized: Well developed, in no acute distress   Neurological examination  Mentation: Alert oriented to time, place, history taking. Follows all commands speech and language fluent Cranial nerve II-XII: Pupils were equal round reactive to light. Extraocular movements were full, visual field were full on confrontational test. Facial sensation and strength were normal. Uvula tongue midline. Head turning and shoulder shrug  were normal and symmetric. Motor: The motor testing reveals 5 over 5 strength of all 4 extremities. Good symmetric motor tone is noted  throughout.  Sensory: Sensory testing is intact to soft touch on all 4 extremities. No evidence of extinction is noted.  Coordination: Cerebellar testing reveals good finger-nose-finger and heel-to-shin bilaterally.  Gait and station: Gait is normal.  Reflexes: Deep tendon reflexes are symmetric and normal bilaterally.   DIAGNOSTIC DATA (LABS, IMAGING, TESTING) - I reviewed patient records, labs, notes, testing and imaging myself where available.  Lab Results  Component Value Date   WBC 6.3 02/26/2017   HGB 11.5 (L) 02/26/2017   HCT 36.1 02/26/2017   MCV 89.8 02/26/2017   PLT 277 02/26/2017      Component Value Date/Time   NA 140 02/07/2017 1100   NA 144 01/17/2015 1555   K 4.0 02/07/2017 1100   CL 105 02/07/2017 1100   CO2 30 02/07/2017 1100   GLUCOSE 89 02/07/2017 1100   BUN 16 02/07/2017 1100   BUN 14 01/17/2015 1555   CREATININE 0.66 02/07/2017 1100   CREATININE 0.68 04/27/2016 1558   CALCIUM 9.2 02/07/2017 1100   PROT 7.1 02/07/2017 1100   PROT 6.2 01/17/2015 1555   ALBUMIN 4.0 02/07/2017 1100   ALBUMIN 4.0 01/17/2015 1555   AST 21 02/07/2017 1100   ALT 21 02/07/2017 1100   ALKPHOS 80 02/07/2017 1100   BILITOT 0.5 02/07/2017 1100   BILITOT <0.2 01/17/2015 1555   GFRNONAA >60 02/07/2017 1100   GFRNONAA >89 09/10/2013 1136   GFRAA >60 02/07/2017 1100   GFRAA >89 09/10/2013 1136   Lab Results  Component Value Date   CHOL 217 (H) 06/05/2016   HDL 64 06/05/2016   LDLCALC 134 (H) 06/05/2016   LDLDIRECT 136 (H) 08/09/2008   TRIG 95 06/05/2016   CHOLHDL 3.4 06/05/2016   Lab Results  Component Value Date   HGBA1C 6.0 05/07/2013   No results found for: [id number] Lab Results  Component Value Date   TSH 2.005 02/17/2013      ASSESSMENT AND PLAN 61 y.o. year old female  has a past medical history of Abdominal pain, left lower quadrant (11/08/2015); Abnormal Pap smear (2011); Anxiety; Arthritis;  Bronchitis; Chronic diastolic heart failure ([place]); Cognitive  decline; GERD (gastroesophageal reflux disease); Hyperlipidemia; Hypertension; Narcolepsy; Premature atrial contractions; PVC's (premature ventricular contractions); Seasonal allergies; and SVD (spontaneous vaginal delivery). here with:  1. Narcolepsy 2. ADD 3. Memory impairment  The patient will continue on Adderall 10 mg daily for narcolepsy. I advised that I'm hesitant to increase Adderall and she does carry a diagnosis of atrial fibrillation. Patient voiced understanding. If symptoms worsen we may have to consider another medication. The patient's memory score has declined slightly. However I'm unsure if her memory impairment is due to learning impairment. We will continue to monitor. The patient is advised that if her symptoms worsen or she develops new symptoms she should let us know. She will follow-up in 6 months with Dr. Brett Fairy or sooner if needed.     Ward Givens, MSN, NP-C 03/05/2017, 8:52 AM [facility] [address] [place], [place] [address] [phone]

## 2017-03-06 ENCOUNTER — Telehealth: Payer: Self-pay

## 2017-03-06 NOTE — Telephone Encounter (Signed)
Patient called me about FMLA.  The urologist has told her that she will need 10 weeks out of work since she lifts with her job. She was asking me about extending her leave. I explained to her that since she had two different surgeries by two different physicians that she is going to have to have 2 FMLA forms done since urologist needs her out of work longer that hysterectomy does. He will need to support her disability with office notes, etc. I would not be able to offer that support. She will contact [employer] and let them know the situation and see what they recommend.

## 2017-03-08 ENCOUNTER — Ambulatory Visit (INDEPENDENT_AMBULATORY_CARE_PROVIDER_SITE_OTHER): Payer: 59 | Admitting: Obstetrics & Gynecology

## 2017-03-08 ENCOUNTER — Encounter: Payer: Self-pay | Admitting: Obstetrics & Gynecology

## 2017-03-08 VITALS — BP 144/86

## 2017-03-08 DIAGNOSIS — Z09 Encounter for follow-up examination after completed treatment for conditions other than malignant neoplasm: Secondary | ICD-10-CM

## 2017-03-08 NOTE — Progress Notes (Signed)
    Kathryn COCCIA [birth year] [id number]        61 y.o.  [id number]   RP: Post op TVH/BSO Cystocele repair   HPI:  Surgery 17 days ago.  Feeling better, no abdo-pelvic pain.  No vaginal bleeding.  No abnormal d/c.  No chills.  Mictions normal.  BMs normal.  Past medical history,surgical history, problem list, medications, allergies, family history and social history were all reviewed and documented in the EPIC chart.  Directed ROS with pertinent positives and negatives documented in the history of present illness/assessment and plan.  Exam:  Vitals:   03/08/17 1537  BP: (!) 144/86   General appearance:  Normal  Abdo:  Soft, not distended, NT.  Gyn exam:  Vulva normal.  Speculum:  Vaginal vault intact, healing well.  Sutures resorbing/falling off.  Assessment/Plan:  61 y.o. [id number]   1. Follow-up examination after gynecological surgery Good Postop progression at 2+ weeks.  No complication.  F/U Dr Wayne Both in 4 weeks.  Princess Bruins MD, 3:56 PM 03/08/2017

## 2017-03-09 NOTE — Patient Instructions (Signed)
1. Follow-up examination after gynecological surgery Good Postop progression at 2+ weeks.  No complication.  F/U Dr Wayne Both in 4 weeks.

## 2017-04-01 ENCOUNTER — Ambulatory Visit: Payer: 59 | Admitting: Gynecology

## 2017-04-09 ENCOUNTER — Encounter: Payer: Self-pay | Admitting: Gynecology

## 2017-04-09 ENCOUNTER — Ambulatory Visit (INDEPENDENT_AMBULATORY_CARE_PROVIDER_SITE_OTHER): Payer: 59 | Admitting: Gynecology

## 2017-04-09 VITALS — BP 118/78

## 2017-04-09 DIAGNOSIS — Z9889 Other specified postprocedural states: Secondary | ICD-10-CM

## 2017-04-09 NOTE — Patient Instructions (Signed)
Follow up at the end of this year for your annual exam

## 2017-04-09 NOTE — Progress Notes (Signed)
    Kathryn Hendricks 1956-03-31 [id number]        61 y.o.  [id number] presents for her postoperative visit status post TVH BSO cystocele repair by Dr. Matilde Sprang 02/19/2017.  Past medical history,surgical history, problem list, medications, allergies, family history and social history were all reviewed and documented in the EPIC chart.  Directed ROS with pertinent positives and negatives documented in the history of present illness/assessment and plan.  Exam: [name] assistant Vitals:   04/09/17 0806  BP: 118/78   General appearance:  Normal Abdomen soft nontender without masses guarding rebound Pelvic external BUS vagina with atrophic changes. Small area of granulation tissue vaginal cuff. Silver nitrate applied. Bimanual without masses or tenderness.  Assessment/Plan:  61 y.o. [id number] with normal postoperative visit status post TVH BSO cystocele repair. Small area of granulation tissue treated with silver nitrate. Pathology reviewed which was all benign. Patient will resume normal GYN activity.  Dr. Matilde Sprang has restricted her for 12 weeks postop due to heavy lifting at work and she'll continue to follow up with him and be released at his discretion. She'll follow up with me end of this year for annual exam.    Anastasio Auerbach MD, 8:21 AM 04/09/2017

## 2017-05-07 ENCOUNTER — Other Ambulatory Visit: Payer: Self-pay | Admitting: Family Medicine

## 2017-05-07 NOTE — Telephone Encounter (Signed)
Patient came by to see about getting her BP meds refilled, not sure if you received this request already, thanks.

## 2017-05-08 MED FILL — LISINOPRIL 5 MG TABLET: 5 | 90 days supply | Qty: 90 | Fill #0

## 2017-05-14 MED FILL — CLINDAMYCIN HCL 300 MG CAPS: 300 | 10 days supply | Qty: 40 | Fill #0

## 2017-05-17 DIAGNOSIS — N8111 Cystocele, midline: Secondary | ICD-10-CM | POA: Diagnosis not present

## 2017-05-28 ENCOUNTER — Telehealth: Payer: Self-pay | Admitting: Neurology

## 2017-05-28 ENCOUNTER — Other Ambulatory Visit: Payer: Self-pay | Admitting: Neurology

## 2017-05-28 DIAGNOSIS — G47411 Narcolepsy with cataplexy: Secondary | ICD-10-CM

## 2017-05-28 DIAGNOSIS — F988 Other specified behavioral and emotional disorders with onset usually occurring in childhood and adolescence: Secondary | ICD-10-CM

## 2017-05-28 MED ORDER — AMPHETAMINE-DEXTROAMPHETAMINE 10 MG PO TABS: 10.0000 mg | ORAL_TABLET | Freq: Every day | ORAL | 0 refills | Status: DC

## 2017-05-28 NOTE — Telephone Encounter (Signed)
Prescription will be ready for pick up later this afternoon

## 2017-05-28 NOTE — Telephone Encounter (Signed)
Patient requesting refill of amphetamine-dextroamphetamine (ADDERALL) 10 MG tablet. ° ° °

## 2017-05-30 MED FILL — DEXTROAMP-AMP 10 MG TAB: 10 | 30 days supply | Qty: 30 | Fill #0

## 2017-06-13 ENCOUNTER — Ambulatory Visit (INDEPENDENT_AMBULATORY_CARE_PROVIDER_SITE_OTHER): Payer: 59 | Admitting: Family Medicine

## 2017-06-13 ENCOUNTER — Telehealth: Payer: Self-pay

## 2017-06-13 VITALS — BP 156/90 | HR 72 | Temp 98.5°F | Ht 64.0 in

## 2017-06-13 DIAGNOSIS — T63301A Toxic effect of unspecified spider venom, accidental (unintentional), initial encounter: Secondary | ICD-10-CM

## 2017-06-13 NOTE — Progress Notes (Signed)
Subjective:    Patient ID: Kathryn Hendricks, female    DOB: February 12, 1956, 61 y.o.   MRN: [id number]   CC: Spider bite  HPI:   Patient reports that on Monday, she took her daughter out to dinner, and her daughter told her she has been having a lot of spiders on her car. She reports that they killed 2 on the inside of the car, but when the patient got home that night she noticed her left leg was very itchy and some bumps had appeared. She tried not to scratch them and placed neosporin on them and started using alcohol to ensure it was clean. She describes the spiders as a light brown color that were about the size of a nickel. She said her daughter has since then vacuumed her car and taken it to the car wash. She denies any tenderness, heat or pain of the area. She denies fevers. She reports that her daughter did not get bitten. She thinks the spider went under her pant leg because she did not see it crawling on her, but saw many in the car. She is here to make sure she does not need antibiotics.   As the patient was leaving, she mentioned that she has been feeling dizzy a few times a week and has noticed that her blood pressure has been elevated now at 2 office visits ( previously with her OB/GYN in June 2018). She was instructed to make a follow up appointment with her PCP for a recheck of her BP, as well as to discuss her dizzy episodes that have been happening periodically since June. She says it does not affect her balance, and she does not feel like she will fall, and she has not lost consciousness. She is not very concerned about it, but expressed that she will make an appointment on her way out with her PCP.  Smoking status reviewed  Review of Systems  Per HPI, else denies recent illness, fever, headache, chest pain, shortness of breath, weakness   Patient Active Problem List   Diagnosis Date Noted  . Spider bite 06/13/2017  . Cystocele with uterine prolapse 02/19/2017  . PVC (premature  ventricular contraction) 12/04/2016  . Supraspinatus tendon tear, left, sequela 07/10/2016  . Left shoulder pain 02/14/2016  . Prolapse of female pelvic organs 01/27/2016  . Biceps tendinitis on left 07/11/2015  . Right wrist pain 06/21/2015  . Grief reaction 06/21/2015  . Mild cognitive impairment with memory loss 11/09/2014  . ADD (attention deficit hyperactivity disorder, inattentive type) 11/09/2014  . Narcolepsy without cataplexy 08/17/2013  . Chronic diastolic heart failure ([place]) 06/30/2013  . HYPERCHOLESTEROLEMIA 07/13/2009  . FIBROIDS, UTERUS 07/02/2009  . HYPERTENSION, BENIGN 09/25/2007     Objective:  BP (!) 156/90 (BP Location: Left Arm, Patient Position: Sitting, Cuff Size: Normal)   Pulse 72   Temp 98.5 F (36.9 C) (Oral)   Ht 5\' 4"  (1.626 m)   SpO2 98%  Vitals and nursing note reviewed  General: NAD, pleasant Cardiac: RRR Respiratory: normal effort Extremities: no edema or cyanosis.  Skin: warm and dry, 4 papules and 2 macules noted on LLE, as pictured below. Nontender. Nonpurulent. No crepitus. No induration. No other marks noted on skin. Neuro: alert and oriented, no focal deficits    Assessment & Plan:    Spider bite Patient instructed to stop using the alcohol. Patient reports that benadryl makes her too tired and hallucinate, and she is already taking Claritin. She says the itch  is not too bad, and she will try to use some cream at home to help with the itch if it gets too bad. Return precautions were discussed.     [place] Ada Woodbury, DO Family Medicine Resident PGY-1

## 2017-06-13 NOTE — Telephone Encounter (Signed)
Per Dr. Gwendlyn Deutscher, pt may need a tetanus shot. I tried to contact pt to see if this has been done within the last 10 years as she is a Equities trader. However, pt has no documentaion of vaccine in epic or [facility], nor did pt answer and her VM was full. I will continue to try.

## 2017-06-13 NOTE — Telephone Encounter (Signed)
I called and spoke with patient regarding tetanus shot. Although most spiders do not carry tetanus organism, she will benefit from updating her status. She stated she is unable to return today as she already left the office. She will come by tomorrow at 4:30 pm for vaccination.   Page, could you please put on on nurse schedule tomorrow at 4:30 pm for Tdap. Thanks.

## 2017-06-13 NOTE — Patient Instructions (Signed)
Thank you for coming to see me today. It was a pleasure! Today we talked about:   Your spider bite. Please take benadryl as needed fort he itch. Please stop using the alcohol on the affected areas. You can continue neosporin if you like. Please return to the office or to the ED if you develop worsening swelling, tenderness, worsening redness, or fever.   If you have any questions or concerns, please do not hesitate to call the office at [phone].  Take Care,   Martin[place] Arbor Cohen, DO    Spider Bite Spider bites are not common. Most spider bites do not cause serious problems. There are only a few types of spider bites that can cause serious health problems. Follow these instructions at home: Medicine  Take or apply over-the-counter and prescription medicines only as told by your doctor. General instructions  Do not scratch the bite area.  Keep the bite area clean and dry. Wash the bite area with soap and water every day as told by your doctor.  If directed, apply ice to the bite area. ? Put ice in a plastic bag. ? Place a towel between your skin and the bag. ? Leave the ice on for 20 minutes, 2-3 times per day.  Raise (elevate) the affected area above the level of your heart while you are sitting or lying down, if this is possible.  Keep all follow-up visits as told by your doctor. This is important. Contact a doctor if:  Your bite does not get better after 3 days.  Your bite turns black or purple.  Near the bite, you have: ? Redness. ? Swelling (inflammation). ? Pain that is getting worse. Get help right away if:  You get shortness of breath or chest pain.  You have fluid, blood, or pus coming from the bite area.  You have muscle cramps or painful muscle spasms.  You have stomach (abdominal) pain.  You feel sick to your stomach (nauseous) or you throw up (vomit).  You feel more tired or sleepy than you normally do. This information is not intended to replace  advice given to you by your health care provider. Make sure you discuss any questions you have with your health care provider. Document Released: 09/15/2010 Document Revised: 04/09/2016 Document Reviewed: 12/29/2014 Elsevier Interactive Patient Education  [employer].

## 2017-06-13 NOTE — Assessment & Plan Note (Signed)
Patient instructed to stop using the alcohol. Patient reports that benadryl makes her too tired and hallucinate, and she is already taking Claritin. She says the itch is not too bad, and she will try to use some cream at home to help with the itch if it gets too bad. Return precautions were discussed.

## 2017-06-14 ENCOUNTER — Ambulatory Visit (INDEPENDENT_AMBULATORY_CARE_PROVIDER_SITE_OTHER): Payer: 59

## 2017-06-14 DIAGNOSIS — Z23 Encounter for immunization: Secondary | ICD-10-CM

## 2017-06-14 DIAGNOSIS — W57XXXA Bitten or stung by nonvenomous insect and other nonvenomous arthropods, initial encounter: Secondary | ICD-10-CM

## 2017-06-14 NOTE — Telephone Encounter (Signed)
Pt to come in at 430 for Tdap. Will give tdap on blue team.

## 2017-06-14 NOTE — Progress Notes (Signed)
Pt presents in nurse clinic for tdap vaccination. Tdap given RD. Pt tolerated injection well.

## 2017-06-17 ENCOUNTER — Encounter: Payer: Self-pay | Admitting: Family Medicine

## 2017-06-18 ENCOUNTER — Encounter: Payer: Self-pay | Admitting: Family Medicine

## 2017-06-18 ENCOUNTER — Ambulatory Visit (INDEPENDENT_AMBULATORY_CARE_PROVIDER_SITE_OTHER): Payer: 59 | Admitting: Family Medicine

## 2017-06-18 VITALS — BP 140/78 | HR 55 | Temp 98.1°F | Ht 64.0 in | Wt 150.0 lb

## 2017-06-18 DIAGNOSIS — T63301A Toxic effect of unspecified spider venom, accidental (unintentional), initial encounter: Secondary | ICD-10-CM

## 2017-06-18 DIAGNOSIS — E7800 Pure hypercholesterolemia, unspecified: Secondary | ICD-10-CM

## 2017-06-18 DIAGNOSIS — E785 Hyperlipidemia, unspecified: Secondary | ICD-10-CM | POA: Diagnosis not present

## 2017-06-18 DIAGNOSIS — Z131 Encounter for screening for diabetes mellitus: Secondary | ICD-10-CM

## 2017-06-18 DIAGNOSIS — R7309 Other abnormal glucose: Secondary | ICD-10-CM | POA: Diagnosis not present

## 2017-06-18 DIAGNOSIS — I10 Essential (primary) hypertension: Secondary | ICD-10-CM | POA: Diagnosis not present

## 2017-06-18 LAB — POCT GLYCOSYLATED HEMOGLOBIN (HGB A1C): Hemoglobin A1C: 5.6

## 2017-06-18 NOTE — Patient Instructions (Signed)
How to Take Your Blood Pressure You can take your blood pressure at home with a machine. You may need to check your blood pressure at home:  To check if you have high blood pressure (hypertension).  To check your blood pressure over time.  To make sure your blood pressure medicine is working.  Supplies needed: You will need a blood pressure machine, or monitor. You can buy one at a drugstore or online. When choosing one:  Choose one with an arm cuff.  Choose one that wraps around your upper arm. Only one finger should fit between your arm and the cuff.  Do not choose one that measures your blood pressure from your wrist or finger.  Your doctor can suggest a monitor. How to prepare Avoid these things for 30 minutes before checking your blood pressure:  Drinking caffeine.  Drinking alcohol.  Eating.  Smoking.  Exercising.  Five minutes before checking your blood pressure:  Pee.  Sit in a dining chair. Avoid sitting in a soft couch or armchair.  Be quiet. Do not talk.  How to take your blood pressure Follow the instructions that came with your machine. If you have a digital blood pressure monitor, these may be the instructions: 1. Sit up straight. 2. Place your feet on the floor. Do not cross your ankles or legs. 3. Rest your left arm at the level of your heart. You may rest it on a table, desk, or chair. 4. Pull up your shirt sleeve. 5. Wrap the blood pressure cuff around the upper part of your left arm. The cuff should be 1 inch (2.5 cm) above your elbow. It is best to wrap the cuff around bare skin. 6. Fit the cuff snugly around your arm. You should be able to place only one finger between the cuff and your arm. 7. Put the cord inside the groove of your elbow. 8. Press the power button. 9. Sit quietly while the cuff fills with air and loses air. 10. Write down the numbers on the screen. 11. Wait 2-3 minutes and then repeat steps 1-10.  What do the numbers  mean? Two numbers make up your blood pressure. The first number is called systolic pressure. The second is called diastolic pressure. An example of a blood pressure reading is "120 over 80" (or 120/80). If you are an adult and do not have a medical condition, use this guide to find out if your blood pressure is normal: Normal  First number: below 120.  Second number: below 80. Elevated  First number: 120-129.  Second number: below 80. Hypertension stage 1  First number: 130-139.  Second number: 80-89. Hypertension stage 2  First number: 140 or above.  Second number: 90 or above. Your blood pressure is above normal even if only the top or bottom number is above normal. Follow these instructions at home:  Check your blood pressure as often as your doctor tells you to.  Take your monitor to your next doctor's appointment. Your doctor will: ? Make sure you are using it correctly. ? Make sure it is working right.  Make sure you understand what your blood pressure numbers should be.  Tell your doctor if your medicines are causing side effects. Contact a doctor if:  Your blood pressure keeps being high. Get help right away if:  Your first blood pressure number is higher than 180.  Your second blood pressure number is higher than 120. This information is not intended to replace advice given   to you by your health care provider. Make sure you discuss any questions you have with your health care provider. Document Released: 07/26/2008 Document Revised: 07/11/2016 Document Reviewed: 01/20/2016 Elsevier Interactive Patient Education  2018 [employer]  

## 2017-06-18 NOTE — Assessment & Plan Note (Signed)
Compliant with meds. FLP checked today. I will contact her soon with test result.

## 2017-06-18 NOTE — Assessment & Plan Note (Signed)
Due to family hx. A1c reassuring. Healthy diet and exercise recommended. F/U as needed.

## 2017-06-18 NOTE — Assessment & Plan Note (Signed)
Resolved

## 2017-06-18 NOTE — Assessment & Plan Note (Signed)
BP looks good despite off meds today. Will continue same. Home BP monitoring recommended. F/U in 3-6 months.

## 2017-06-18 NOTE — Progress Notes (Addendum)
Subjective:     Patient ID: Kathryn Hendricks, female   DOB: 09-17-1955, 61 y.o.   MRN: [id number]  HPI HTN: Here for follow-up. She is compliant with her 5 mg Lisinopril. She did not take her meds today. She does not check her BP at home as often. Denies any concern today. Hyperlipidemia: She is compliant with meds ( Zocor 40 mg qd). She is here for f/u. DM2 Screen: FHX of DM2, she will like to get screened again. Denies any DM related symptoms. Spider bite: Here for follow-up. Her lesion has resolved.  Current Outpatient Prescriptions on File Prior to Visit  Medication Sig Dispense Refill  . amphetamine-dextroamphetamine (ADDERALL) 10 MG tablet Take 1 tablet (10 mg total) by mouth daily with breakfast. 30 tablet 0  . lisinopril (PRINIVIL,ZESTRIL) 5 MG tablet TAKE 1 TABLET BY MOUTH ONCE DAILY 90 tablet 1  . simvastatin (ZOCOR) 40 MG tablet Take 1 tablet (40 mg total) by mouth at bedtime. 90 tablet 2  . acetaminophen (TYLENOL) 325 MG tablet Take 650 mg by mouth every 6 (six) hours as needed for mild pain or headache.    . albuterol (PROVENTIL HFA;VENTOLIN HFA) 108 (90 Base) MCG/ACT inhaler Inhale 2 puffs into the lungs 4 (four) times daily. (Patient not taking: Reported on 06/18/2017) 1 Inhaler 4  . aspirin 81 MG EC tablet Take 1 tablet (81 mg total) by mouth daily. (Patient not taking: Reported on 06/18/2017) 90 tablet 1  . ibuprofen (ADVIL,MOTRIN) 200 MG tablet Take 400 mg by mouth every 6 (six) hours as needed for headache or moderate pain.    . naproxen (EC NAPROSYN) 500 MG EC tablet Take 1 tablet (500 mg total) by mouth 2 (two) times daily as needed. (Patient not taking: Reported on 06/18/2017) 60 tablet 1  . omeprazole (PRILOSEC) 40 MG capsule Take 1 capsule (40 mg total) by mouth daily. 90 capsule 1   No current facility-administered medications on file prior to visit.    Past Medical History:  Diagnosis Date  . Abdominal pain, left lower quadrant 11/08/2015  . Abnormal Pap smear 2011    . Anxiety   . Arthritis    lower back and shoulders  . Bronchitis    hx - used abluterol inhaler  . Chronic diastolic heart failure ([place])   . Cognitive decline   . FIBROIDS, UTERUS 07/02/2009   Qualifier: Diagnosis of  By: Ta MD, Cat    . GERD (gastroesophageal reflux disease)    occasional  . Hyperlipidemia   . Hypertension   . Narcolepsy   . Premature atrial contractions   . PVC's (premature ventricular contractions)   . Seasonal allergies   . SVD (spontaneous vaginal delivery)    x 3   Vitals:   06/18/17 0838  BP: 140/78  Pulse: (!) 55  Temp: 98.1 F (36.7 C)  TempSrc: Oral  SpO2: 99%  Weight: 150 lb (68 kg)  Height: 5\' 4"  (1.626 m)     Review of Systems  Respiratory: Negative.   Cardiovascular: Negative.   Genitourinary: Negative.   All other systems reviewed and are negative.      Objective:   Physical Exam  Constitutional: She is oriented to person, place, and time. She appears well-developed. No distress.  Cardiovascular: Normal rate, regular rhythm and normal heart sounds.   No murmur heard. Pulmonary/Chest: Effort normal and breath sounds normal. No respiratory distress. She has no wheezes.  Musculoskeletal: Normal range of motion. She exhibits no edema.  Neurological: She  is alert and oriented to person, place, and time.  Skin:  Lesion on calf cleared.  Nursing note and vitals reviewed.        Assessment:     HTN HLD DM2 Screening Insect bite.    Plan:     Check problem list.  Note: Her HR is slightly low, but she is clinically asymptomatic. We will watch for now. F/U as needed.

## 2017-06-19 ENCOUNTER — Ambulatory Visit: Payer: 59

## 2017-06-19 ENCOUNTER — Encounter: Payer: Self-pay | Admitting: *Deleted

## 2017-06-19 ENCOUNTER — Telehealth: Payer: Self-pay | Admitting: *Deleted

## 2017-06-19 LAB — LIPID PANEL
CHOL/HDL RATIO: 2.9 ratio (ref 0.0–4.4)
Cholesterol, Total: 171 mg/dL (ref 100–199)
HDL: 59 mg/dL (ref >39)
LDL Calculated: 100 mg/dL — ABNORMAL HIGH (ref 0–99)
Triglycerides: 61 mg/dL (ref 0–149)
VLDL Cholesterol Cal: 12 mg/dL (ref 5–40)

## 2017-06-19 NOTE — Telephone Encounter (Signed)
mychart message sent to patient. Jazmin Hartsell,CMA  

## 2017-06-19 NOTE — Telephone Encounter (Signed)
Attempted to contact patient but there was no answer and voicemail was full.  Will try to contact again one more time and then will mail a letter. Loyd Marhefka,CMA

## 2017-06-19 NOTE — Telephone Encounter (Signed)
-----   Message from Kinnie Feil, MD sent at 06/19/2017  8:58 AM EDT ----- Please advise patient that her cholesterol test improved from last year. Continue current dose of Lipitor.

## 2017-07-11 ENCOUNTER — Other Ambulatory Visit: Payer: Self-pay | Admitting: Neurology

## 2017-07-11 ENCOUNTER — Telehealth: Payer: Self-pay | Admitting: *Deleted

## 2017-07-11 DIAGNOSIS — G47411 Narcolepsy with cataplexy: Secondary | ICD-10-CM

## 2017-07-11 DIAGNOSIS — F988 Other specified behavioral and emotional disorders with onset usually occurring in childhood and adolescence: Secondary | ICD-10-CM

## 2017-07-11 MED ORDER — AMPHETAMINE-DEXTROAMPHETAMINE 10 MG PO TABS: 10.0000 mg | ORAL_TABLET | Freq: Every day | ORAL | 0 refills | Status: DC

## 2017-07-11 NOTE — Telephone Encounter (Signed)
Patient stopped by the office today to request a RX for her Adderall.  Please call when ready.

## 2017-07-11 NOTE — Telephone Encounter (Signed)
Script will be ready for pick up after lunch today. Called pt and made her aware of this. Pt verbalized understanding.

## 2017-07-16 MED FILL — DEXTROAMP-AMP 10 MG TAB: 10 | 30 days supply | Qty: 30 | Fill #0

## 2017-07-25 MED FILL — CLINDAMYCIN HCL 300 MG CAPS: 300 | 10 days supply | Qty: 40 | Fill #0

## 2017-08-22 MED FILL — LISINOPRIL 5 MG TABLET: 5 | 90 days supply | Qty: 90 | Fill #1

## 2017-09-19 ENCOUNTER — Encounter: Payer: Self-pay | Admitting: Gynecology

## 2017-09-19 ENCOUNTER — Ambulatory Visit (INDEPENDENT_AMBULATORY_CARE_PROVIDER_SITE_OTHER): Payer: 59 | Admitting: Gynecology

## 2017-09-19 VITALS — BP 120/76 | Ht 64.0 in | Wt 152.0 lb

## 2017-09-19 DIAGNOSIS — N952 Postmenopausal atrophic vaginitis: Secondary | ICD-10-CM

## 2017-09-19 DIAGNOSIS — Z01411 Encounter for gynecological examination (general) (routine) with abnormal findings: Secondary | ICD-10-CM

## 2017-09-19 DIAGNOSIS — N3941 Urge incontinence: Secondary | ICD-10-CM

## 2017-09-19 NOTE — Progress Notes (Signed)
    Kathryn Hendricks 1956/05/08 [id number]        61 y.o.  [id number] for annual gynecologic exam.  History of TVH BSO anterior/posterior colporrhaphy this past July.  Has done well since.  Notes some mild urinary urgency particularly after coffee.  No stress incontinence symptoms.  Past medical history,surgical history, problem list, medications, allergies, family history and social history were all reviewed and documented as reviewed in the EPIC chart.  ROS:  Performed with pertinent positives and negatives included in the history, assessment and plan.   Additional significant findings : None   Exam: [name] assistant Vitals:   09/19/17 0759  BP: 120/76  Weight: 152 lb (68.9 kg)  Height: 5\' 4"  (1.626 m)   Body mass index is 26.09 kg/m.  General appearance:  Normal affect, orientation and appearance. Skin: Grossly normal HEENT: Without gross lesions.  No cervical or supraclavicular adenopathy. Thyroid normal.  Lungs:  Clear without wheezing, rales or rhonchi Cardiac: RR, without RMG Abdominal:  Soft, nontender, without masses, guarding, rebound, organomegaly or hernia Breasts:  Examined lying and sitting without masses, retractions, discharge or axillary adenopathy. Pelvic:  Ext, BUS, Vagina: With atrophic changes  Adnexa: Without masses or tenderness    Anus and perineum: Normal   Rectovaginal: Normal sphincter tone without palpated masses or tenderness.    Assessment/Plan:  62 y.o. [id number] female for annual gynecologic exam.   1. Postmenopausal/atrophic genital changes.  No significant hot flushes, night sweats or vaginal dryness. 2. Mild urgency symptoms after coffee.  Discussed behavior modification.  Reviewed possible medication for OAB.  Patient not interested at this time.  Check baseline urine analysis.  Follow-up if continues to be an issue and she wants to rediscuss medication options. 3. Mammography coming due in April/May and I reminded her to schedule this.   Breast exam normal today.  SBE monthly reviewed. 4. Pap smear/HPV 06/2015.  No Pap smear done today.  No history of abnormal Pap smears.  Reviewed current screening guidelines and options to stop screening based on hysterectomy history reviewed.  Will readdress on an annual basis. 5. Colonoscopy 2016.  Repeat at their recommended interval. 6. Health maintenance.  No routine lab work done as patient does this elsewhere.  Follow-up 1 year, sooner as needed.   Anastasio Auerbach MD, 8:20 AM 09/19/2017

## 2017-09-19 NOTE — Patient Instructions (Signed)
Follow-up in 1 year for annual exam, sooner if any issues. 

## 2017-09-20 LAB — URINE CULTURE
MICRO NUMBER:: 90102126
SPECIMEN QUALITY:: ADEQUATE

## 2017-09-20 LAB — URINALYSIS, COMPLETE W/RFL CULTURE
BILIRUBIN URINE: NEGATIVE
Bacteria, UA: NONE SEEN /HPF
GLUCOSE, UA: NEGATIVE
Hyaline Cast: NONE SEEN /LPF
Ketones, ur: NEGATIVE
NITRITES URINE, INITIAL: NEGATIVE
PH: 7 (ref 5.0–8.0)
PROTEIN: NEGATIVE
Specific Gravity, Urine: 1.01 (ref 1.001–1.03)

## 2017-09-20 LAB — CULTURE INDICATED

## 2017-09-20 MED ORDER — CIPROFLOXACIN HCL 250 MG PO TABS: 250.0000 mg | ORAL_TABLET | Freq: Two times a day (BID) | ORAL | 0 refills | Status: DC

## 2017-09-20 MED FILL — CIPROFLOXACIN HCL 250 MG TA: 250 | 3 days supply | Qty: 6 | Fill #0

## 2017-09-20 NOTE — Addendum Note (Signed)
Addended by: Lorine Bears on: 09/20/2017 03:21 PM   Modules accepted: Orders

## 2017-10-08 ENCOUNTER — Other Ambulatory Visit: Payer: Self-pay | Admitting: Neurology

## 2017-10-08 ENCOUNTER — Telehealth: Payer: Self-pay | Admitting: Neurology

## 2017-10-08 DIAGNOSIS — F988 Other specified behavioral and emotional disorders with onset usually occurring in childhood and adolescence: Secondary | ICD-10-CM

## 2017-10-08 DIAGNOSIS — G47411 Narcolepsy with cataplexy: Secondary | ICD-10-CM

## 2017-10-08 MED ORDER — AMPHETAMINE-DEXTROAMPHETAMINE 10 MG PO TABS: 10.0000 mg | ORAL_TABLET | Freq: Every day | ORAL | 0 refills | Status: DC

## 2017-10-08 NOTE — Telephone Encounter (Signed)
Pt calling for a refill of amphetamine-dextroamphetamine (ADDERALL) 10 MG tablet

## 2017-10-08 NOTE — Telephone Encounter (Signed)
Will place at the front later this afternoon for the patient to pick up

## 2017-10-14 MED FILL — DEXTROAMP-AMP 10 MG TAB: 10 | 30 days supply | Qty: 30 | Fill #0

## 2017-11-05 ENCOUNTER — Encounter: Payer: Self-pay | Admitting: Neurology

## 2017-11-05 ENCOUNTER — Ambulatory Visit: Payer: 59 | Admitting: Neurology

## 2017-11-05 ENCOUNTER — Telehealth: Payer: Self-pay | Admitting: Neurology

## 2017-11-05 VITALS — BP 135/78 | HR 72 | Ht 64.0 in | Wt 151.0 lb

## 2017-11-05 DIAGNOSIS — G47411 Narcolepsy with cataplexy: Secondary | ICD-10-CM | POA: Insufficient documentation

## 2017-11-05 DIAGNOSIS — G4710 Hypersomnia, unspecified: Secondary | ICD-10-CM | POA: Diagnosis not present

## 2017-11-05 DIAGNOSIS — I739 Peripheral vascular disease, unspecified: Secondary | ICD-10-CM | POA: Insufficient documentation

## 2017-11-05 DIAGNOSIS — I5032 Chronic diastolic (congestive) heart failure: Secondary | ICD-10-CM

## 2017-11-05 HISTORY — DX: Hypersomnia, unspecified: G47.10

## 2017-11-05 MED ORDER — DEXMETHYLPHENIDATE HCL ER 20 MG PO CP24: 20.0000 mg | ORAL_CAPSULE | Freq: Every day | ORAL | 0 refills | Status: DC

## 2017-11-05 NOTE — Progress Notes (Signed)
PATIENT: Kathryn Hendricks DOB: 1955-11-24  REASON FOR VISIT: follow up- narcolepsy, mild memory impairment, ADD   HISTORY OF PRESENT ILLNESS: narcolepsy, ADD, possible learning disability.     HISTORY: 11-05-2017. I have the pleasure to meet today with Kathryn Hendricks on 05 November 2017.  Kathryn Hendricks is a 62 year old African-American right-handed female with a diagnosis of narcolepsy, possibly cataplexy, and a concern of mild memory impairment.  We suspect that the patient had ADD which affected her ability to perform well in school and she may have a learning disability in addition.  She is treated currently with Adderall as a stimulant to keep her from falling asleep but this is not always working or not working long enough to allow her to participate in a full day's work.  It also leaves her still with a very high degree of daytime sleepiness, unsafe to operate machinery or drive.  Today's Epworth score was endorsed at 20 points out of 24 possible points, she has not yet taken medication for this day.  Her fatigue severity was endorsed at 52 points/ 63 possible points. 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  She also mentioned that she had just a 7th grandchild , 3 month ago.   Today 03/05/17, Kathryn Hendricks is a 62 year old female with a history of narcolepsy and mild memory impairment in ADD. She returns today for follow-up. She reports that she continues to take Adderall 10 mg daily. She does feel that it may not be as effective as it was when she initially started the medication. She says later in the day she is very sleepy. She reports that she does recently had a hysterectomy. She states since then she has noticed increased mood swings. However she does report that she was having some mood swings before the surgery. She does note that she is very forgetful. She currently lives at home alone. She is able to complete all ADLs. She  does operate a motor vehicle and occasionally will get lost but is able to correct this. She returns today for an evaluation  HISTORY 06-19-2016 . Kathryn Hendricks is a 62 year old  Afro-american single female with a history of narcolepsy, mild memory impairment and ADD.  There is a suspicion of a life long learning disability . She lost her sister in late October 2016. She is currently taking Adderall 10 mg daily- works fairly well. 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 She states that usually by the end of her shift she is exhausted. She states that sometimes when she goes home if she tries to nap she will sleep for hours. She is able to complete all ADLs independently. She will undergo a hysterectomy within the next couple of months, she recently saw her cardiologist and was told that her cardiac function is fine, according to her. Fatigue severity is still very high for this patient, a 59 points, the Epworth sleepiness score is still endorsed at 18 points, this makes driving unsafe for her.   04/27/15 (Quanetta Truss): Kathryn Hendricks is a 62 y.o. female , seen here as arevisit from Dr. Gwendlyn Deutscher .The patient was last seen on 01-19-15 .Kathryn Hendricks is a 62 y.o. female seen here as a referral from Dr. Gwendlyn Deutscher for treatment of recently diagnosed Narcolepsy and evaluation of memory concerns. Kathryn Hendricks reports that she had increasing difficulties with sleepiness and felt increasingly fatigued, she was not aware  that she had physically exerted herself. Her primary care physician at the time was [name] and he referred her for a sleep study in 2014. She [facility], resulting in a very normal PSG , but with a sleep latency of 3 minutes , a REM latency was of 67.5 minutes.  Her sleep efficiency was 85.3% and her total sleep time 331 minutes. There were no significant apneas or hypopneas noted, and her AHI was 3.8. There were no periodic  limb movements noted. Study was deemed valid for an MSLT to follow.  The MSLT documented 5 naps with a mean sleep latency of 0.6 minutes and two naps with sleep REM onsets.  This test is diagnostic for the condition of narcolepsy , the MSLT took place on 08-05-13.   Kathryn Hendricks has endorsed again an Epworth Sleepiness Scale of 24 points at the fatigue severity score of 62 out of a possible 63, points. She had stopped modafinil in January after she felt it had become ineffective and told this to Dr. Halford Chessman , her treating sleep physician, after the fact.  She reported falling asleep and feeling tired all the time again she has been is especially being concerned because she is falling asleep when driving. She has tried not to drive alone. She was advised that she is not safe to drive or operate machinery !! The patient endorsed no cataplectic attacks. She also stated that the Provigil made her feel short of breath. Just yesterday she fell asleep at the dinner table when her sister was her Guest of on her. She was very embarrassed. She goes to bed between 7 and 8 PM and usually is asleep promptly. She will sleep through until 5-5:30 AM. She will go once to the bathroom at night on average. He may have throat little periods of waking up but usually is able to fall asleep right away again. Her sleep does not feel restorative to her. She has lots of vivid dreams and sometimes dreams of being chased or by the nightmarish content. She has noticed more trouble with her memory but may be fatigue related but could be an independent finding. She feels sometimes clumsy and unsteady when walking. She is upsetting her family since she begun to repeat herself frequently and has been displacing things. She has more trouble with spelling.  I would like to add that I was able to review her sleep studies here in person and an echo- cardiogram performed on 06-16-13 with a normal ejection fraction of 65% at grade 1  diastolic dysfunction and mild L a dilated patient. They have been previously no focal neurologic deficits noted.   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 She frequenlty fell asleep in school. She was napping daily after school. Life long complaint of excessive daytimesleepiness.She had a [facility] at [facility] and  Dr . Halford Chessman has followed her.   Interval history from 8-31-16Chief complaint according to patient : Mrs. Gindlesperger is here today to discuss the results of an MRI study obtained earlier this summer. She received a call that there were no acute findings but she would like to know what exactly the findings mean. She's also endorsing still a very high degree of daytime sleepiness, 20-24 points . Within our session today we obtained a Mini-Mental Status  Examination .  Mrs. Delo's MRI of the brain from 01-27-15 documented an underdeveloped splenium of the corpus callosum, a thin lipoma and periventricular FLAIR hyperintensities which we present most likely developmental changes, and no acute findings were seen. This MRI could not be compared to previous MRIs as none were available. Mrs. Dlouhy's question if her learning disabilities could be related to an abnormality of the splenium have to be answered as a cautious possibility, this would not explain her hypersomnia and a discussed with her that her excessive daytime sleepiness is not related to the changes in the brain as noted. Her cognitive difficulties have been with her all her life, and a Mini-Mental Status Examination today to exclude the attention and calculation part. The patient scored 25 out of 30 points. This has been a stable finding she was also able to copy an image, she was able to draw a clock face and she named 14 animals in a fluency test.  Sleep habits are as follows: She reports  falling asleep in public places, for example in a restaurant in the waiting room etc. she's not able safely to operate machinery she's not able to safely drive because of her excessive daytime sleepiness. She has been prescribed modafinil as well as a stimulants which have given her a little bit more energy but not having a lasting effect. I would be hesitant to consider this patient a good candidate for Xyrem.  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 The patient works 8 to 16.30 .  06-19-2016 . Mrs. Zellman is a 62 year old  Afro-american single female with a history of narcolepsy, mild memory impairment and ADD.  There is a suspicion of a life long learning  disability . She lost her sister in late October 2016. She is currently taking Adderall 10 mg daily- works fairly well. 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  She states that usually by the end of her shift she is exhausted. She states that sometimes when she goes home if she tries to nap she will sleep for hours. She is able to complete all ADLs independently. She will undergo a hysterectomy within the next couple of months, she recently saw her cardiologist and was told that her cardiac function is fine, according to her. Fatigue severity is still very high for this patient, a 59 points, the Epworth sleepiness score is still endorsed at 18 points, this makes driving unsafe for her.  She inquires about FMLA for her job.   REVIEW OF SYSTEMS: Out of a complete 14 system review of symptoms, the patient complains only of the following symptoms, and all other reviewed systems are negative.  Easily distracted,  Hypersomnia is persistent.   ALLERGIES: Allergies  Allergen Reactions  . Penicillins Shortness Of Breath, Itching and Swelling    Has patient had a PCN reaction causing immediate rash, facial/tongue/throat swelling, SOB or lightheadedness with  hypotension: Yes Has patient had a PCN reaction causing severe rash involving mucus membranes or skin necrosis: No Has patient had a PCN reaction that required hospitalization: No Has patient had a PCN reaction occurring within the last 10 years: No If all of the above answers are "NO", then may proceed with Cephalosporin use.     HOME MEDICATIONS: Outpatient Medications Prior to Visit  Medication Sig Dispense Refill  . acetaminophen (TYLENOL) 325 MG tablet Take 650 mg by mouth every 6 (six) hours  as needed for mild pain or headache.    . albuterol (PROVENTIL HFA;VENTOLIN HFA) 108 (90 Base) MCG/ACT inhaler Inhale 2 puffs into the lungs 4 (four) times daily. 1 Inhaler 4  . amphetamine-dextroamphetamine (ADDERALL) 10 MG tablet Take 1 tablet (10 mg total) by mouth daily with breakfast. 30 tablet 0  . aspirin 81 MG EC tablet Take 1 tablet (81 mg total) by mouth daily. 90 tablet 1  . ciprofloxacin (CIPRO) 250 MG tablet Take 1 tablet (250 mg total) by mouth 2 (two) times daily. 6 tablet 0  . ibuprofen (ADVIL,MOTRIN) 200 MG tablet Take 400 mg by mouth every 6 (six) hours as needed for headache or moderate pain.    Marland Kitchen lisinopril (PRINIVIL,ZESTRIL) 5 MG tablet TAKE 1 TABLET BY MOUTH ONCE DAILY 90 tablet 1  . naproxen (EC NAPROSYN) 500 MG EC tablet Take 1 tablet (500 mg total) by mouth 2 (two) times daily as needed. 60 tablet 1  . omeprazole (PRILOSEC) 40 MG capsule Take 1 capsule (40 mg total) by mouth daily. 90 capsule 1  . simvastatin (ZOCOR) 40 MG tablet Take 1 tablet (40 mg total) by mouth at bedtime. 90 tablet 2   No facility-administered medications prior to visit.     PAST MEDICAL HISTORY: Past Medical History:  Diagnosis Date  . Abdominal pain, left lower quadrant 11/08/2015  . Abnormal Pap smear 2011  . Anxiety   . Arthritis    lower back and shoulders  . Bronchitis    hx - used abluterol inhaler  . Chronic diastolic heart failure ([place])   . Cognitive decline   . FIBROIDS, UTERUS  07/02/2009   Qualifier: Diagnosis of  By: Ta MD, Cat    . GERD (gastroesophageal reflux disease)    occasional  . Hyperlipidemia   . Hypertension   . Narcolepsy   . Premature atrial contractions   . PVC's (premature ventricular contractions)   . Seasonal allergies   . SVD (spontaneous vaginal delivery)    x 3    PAST SURGICAL HISTORY: Past Surgical History:  Procedure Laterality Date  . ANTERIOR AND POSTERIOR REPAIR N/A 02/19/2017   Procedure: ANTERIOR (CYSTOCELE);  Surgeon: Bjorn Loser, MD;  Location: [facility];  Service: Urology;  Laterality: N/A;  . APPENDECTOMY    . COLONOSCOPY    . CYSTOSCOPY N/A 02/19/2017   Procedure: CYSTOSCOPY;  Surgeon: Bjorn Loser, MD;  Location: [facility];  Service: Urology;  Laterality: N/A;  . DILATION AND CURETTAGE OF UTERUS  2010  . HYSTEROSCOPY W/ ENDOMETRIAL ABLATION  2010  . SALPINGOOPHORECTOMY Bilateral 02/19/2017   Procedure: SALPINGO OOPHORECTOMY;  Surgeon: Anastasio Auerbach, MD;  Location: [facility];  Service: Gynecology;  Laterality: Bilateral;  . TUBAL LIGATION    . VAGINAL HYSTERECTOMY N/A 02/19/2017   Procedure: HYSTERECTOMY VAGINAL ,  BSO.;  Surgeon: Anastasio Auerbach, MD;  Location: [facility];  Service: Gynecology;  Laterality: N/A;  DO NOT OPEN LAP INSTRUMENTS  . WISDOM TOOTH EXTRACTION      FAMILY HISTORY: Family History  Problem Relation Age of Onset  . Hypertension Mother   . Diabetes Mother   . Stroke Mother   . CAD Mother 46  . Narcolepsy Mother        not diagnosed  . Hypertension Sister   . Cancer Sister        Colon cancer  . Hypertension Brother   . Cancer Brother        throat  . Diabetes Father   . Heart  disease Father     SOCIAL HISTORY: Social History   Socioeconomic History  . Marital status: Divorced    Spouse name: Not on file  . Number of children: 3  . Years of education: 37  . Highest education level: Not on file  Social Needs  . Financial resource strain: Not on file  . Food insecurity -  worry: Not on file  . Food insecurity - inability: Not on file  . Transportation needs - medical: Not on file  . Transportation needs - non-medical: Not on file  Occupational History  . Occupation: Microbiologist: [employer]  Tobacco Use  . Smoking status: Never Smoker  . Smokeless tobacco: Never Used  Substance and Sexual Activity  . Alcohol use: No    Alcohol/week: 0.0 oz  . Drug use: No  . Sexual activity: No    Birth control/protection: Post-menopausal, Surgical    Comment: 1st intercourse 61 yo-5 partners  Other Topics Concern  . Not on file  Social History Narrative   Caffeine 1 cup coffee in am.      PHYSICAL EXAM  Vitals:   11/05/17 1044  BP: 135/78  Pulse: 72  Weight: 151 lb (68.5 kg)  Height: 5\' 4"  (1.626 m)   Body mass index is 25.92 kg/m.  MMSE - Mini Mental State Exam 03/05/2017 06/19/2016 11/03/2015  Orientation to time 4 4 5   Orientation to Place 5 5 5   Registration 3 3 3   Attention/ Calculation 0 2 4  Recall 1 2 2   Language- name 2 objects 2 2 2   Language- repeat 0 0 0  Language- follow 3 step command 3 3 2   Language- read & follow direction 1 1 1   Write a sentence 0 0 1  Copy design 1 0 0  Total score 20 22 25     Finished High school , had special education classes.  Writing and calculation ability is poor.   General: The patient is awake, alert and appears not in acute distress. The patient is well groomed. Head: Normocephalic, atraumatic. Neck is supple. Mallampati 3, neck circumference:13 inches  Cardiovascular:  Regular rate and rhythm , without  murmurs or carotid bruit, and without distended neck veins. Respiratory: Lungs are clear to auscultation.Skin:  Without evidence of edema, or rash  Neurologic exam : The patient is awake and alert, oriented to place and time. Memory subjective described as impaired- There is a reduced  attention span & concentration ability. Speech is fluent without dysarthria, dysphonia or aphasia. Mood  and affect are appropriate.  Cranial nerves: Pupils are equal and briskly reactive to light. Extraocular movements  in vertical and horizontal planes intact and without nystagmus. Visual fields by finger perimetry are intact.Hearing to finger rub intact.  Facial sensation intact to fine touch. Facial motor strength is symmetric and tongue and uvula move midline.  Motor exam:   Normal tone and normal muscle bulk and symmetric normal strength in all extremities. Sensory:  Fine touch, pinprick and vibration were  normal. Coordination: Rapid alternating movements intact- Finger-to-nose maneuver without evidence of ataxia, dysmetria or tremor. Gait and station: Patient walks without assistive device and is able and assisted stool climb up to the exam table.  Strength within normal limits. Stance is stable and normal- Steps are unfragmented. Romberg testing is normal. Deep tendon reflexes: in the  upper and lower extremities are symmetric, 2 plus.     DIAGNOSTIC DATA (LABS, IMAGING, TESTING) - I reviewed patient records, labs,  notes, testing and imaging myself where available. Labs from 2018 were reviewed.      ASSESSMENT AND PLAN:  1.Narcolepsy poorly controlled on Adderall, one a day 10 mg. No HTN resulting. persistent hypersomnia in narcolepsy. She is easier irritated and wonders if this Adderall.  Cataplexy described as "dropping objects" when startled.  2. Atrial fibrillation- cardiologist with [facility]. May change to ritalin .  3.Memory impairment, subjective - MMSE, dyslexia.  4. ADD, adult - benefit from Adderall or Ritalin .   Overall the patient is doing well. Methylphenidate replaces Adderall for now- see if this works without  Causing impulsivity. The patient's memory has remained stable. We will continue to monitor.  Patient advised that if her depression or angry outbursts worsen she should let us know.   She will follow-up in 3-4 months with NP to see if Ritalin works.  MMSE.   I believe you have a condition called narcolepsy: This means, that you have a sleep disorder that manifests with at times severe excessive sleepiness during the day and often with problems with sleep at night. We may have to try different medications that may help you stay awake during the day. Not everything works with everybody the same way. Wake promoting agents include stimulants and non-stimulant type medications. The most common side effects with stimulants are weight loss, insomnia, nervousness, headaches, palpitations, rise in blood pressure, anxiety. Stimulants can be addictive and subject to abuse. Non-stimulant type wake promoting medications include Provigil and Nuvigil, most common side effects include headaches, nervousness, insomnia, hypertension. In addition there is a medication called Xyrem which has been proven to be very effective in patients with narcolepsy with or without cataplexy. Some patients with narcolepsy report episodes of weakness, such as jaw or facial weakness, legs giving out, feeling wobbly or like "Jell-o", etc. in situations of anxiety, stress, laughter, sudden sadness, surprise, etc., which is called cataplexy. You can also experience episodes of sleep paralysis during which you may feel unable to move upon awakening. Some people experience dreamlike sequences upon awakening or upon drifting off to sleep, called hypnopompic or hypnagogic hallucinations.    Larey Seat, MD  11/05/2017, 10:57 AM [facility] [address] [place], [place] [address] [phone]

## 2017-11-05 NOTE — Telephone Encounter (Signed)
Patient in lobby just returned from pharmacy. They told her insurance will not pay for Ritalin  if over 18 or she has to pay out of pocket. Pharmacy is faxing [place] over info. Patient can wait in lobby a little while or call her at [phone]

## 2017-11-05 NOTE — Telephone Encounter (Signed)
PA complete on the phone with [facility] on 11/05/17. Can take up to 24-72 hours before hearing back. REF # [id number]

## 2017-11-05 NOTE — Patient Instructions (Addendum)
Methylphenidate tablets What is this medicine? METHYLPHENIDATE (meth il FEN i date) is used to treat attention-deficit hyperactivity disorder (ADHD). It is also used to treat narcolepsy. This medicine may be used for other purposes; ask your health care provider or pharmacist if you have questions. COMMON BRAND NAME(S): Methylin, Ritalin What should I tell my health care provider before I take this medicine? They need to know if you have any of these conditions: -anxiety or panic attacks -circulation problems in fingers and toes -glaucoma -hardening or blockages of the arteries or heart blood vessels -heart disease or a heart defect -high blood pressure -history of a drug or alcohol abuse problem -history of stroke -liver disease -mental illness -motor tics, family history or diagnosis of Tourette's syndrome -seizures -suicidal thoughts, plans, or attempt; a previous suicide attempt by you or a family member -thyroid disease -an unusual or allergic reaction to methylphenidate, other medicines, foods, dyes, or preservatives -pregnant or trying to get pregnant -breast-feeding How should I use this medicine? Take this medicine by mouth with a glass of water. Follow the directions on the prescription label. It is best to take this medicine 30 to 45 minutes before meals, unless your doctor tells you otherwise. Take your medicine at regular intervals. Usually the last dose of the day will be taken at least 4 to 6 hours before bedtime, so it will not interfere with sleep. Do not take your medicine more often than directed. A special MedGuide will be given to you by the pharmacist with each prescription and refill. Be sure to read this information carefully each time. Talk to your pediatrician regarding the use of this medicine in children. While this drug may be prescribed for children as young as 6 years of age for selected conditions, precautions do apply. Overdosage: If you think you have  taken too much of this medicine contact a poison control center or emergency room at once. NOTE: This medicine is only for you. Do not share this medicine with others. What if I miss a dose? If you miss a dose, take it as soon as you can. If it is almost time for your next dose, take only that dose. Do not take double or extra doses. What may interact with this medicine? Do not take this medicine with any of the following medications: -lithium -MAOIs like Carbex, Eldepryl, Marplan, Nardil, and Parnate -other stimulant medicines for attention disorders, weight loss, or to stay awake -procarbazine This medicine may also interact with the following medications: -atomoxetine -caffeine -certain medicines for blood pressure, heart disease, irregular heart beat -certain medicines for depression, anxiety, or psychotic disturbances -certain medicines for seizures like carbamazepine, phenobarbital, phenytoin -cold or allergy medicines -warfarin This list may not describe all possible interactions. Give your health care provider a list of all the medicines, herbs, non-prescription drugs, or dietary supplements you use. Also tell them if you smoke, drink alcohol, or use illegal drugs. Some items may interact with your medicine. What should I watch for while using this medicine? Visit your doctor or health care professional for regular checks on your progress. This prescription requires that you follow special procedures with your doctor and pharmacy. You will need to have a new written prescription from your doctor or health care professional every time you need a refill. This medicine may affect your concentration, or hide signs of tiredness. Until you know how this drug affects you, do not drive, ride a bicycle, use machinery, or do anything that needs mental alertness.   prescription from your doctor or health care professional every time you need a refill.  This medicine may affect your concentration, or hide signs of tiredness. Until you know how this drug affects you, do not drive, ride a bicycle, use machinery, or do anything that needs mental alertness.  Tell your doctor or health care professional if this medicine loses its effects, or if you feel you need to take more than the  prescribed amount. Do not change the dosage without talking to your doctor or health care professional.  For males, contact your doctor or health care professional right away if you have an erection that lasts longer than 4 hours or if it becomes painful. This may be a sign of a serious problem and must be treated right away to prevent permanent damage.  Decreased appetite is a common side effect when starting this medicine. Eating small, frequent meals or snacks can help. Talk to your doctor if you continue to have poor eating habits. Height and weight growth of a child taking this medicine will be monitored closely.  Do not take this medicine close to bedtime. It may prevent you from sleeping.  If you are going to need surgery, a MRI, CT scan, or other procedure, tell your doctor that you are taking this medicine. You may need to stop taking this medicine before the procedure.  Tell your doctor or healthcare professional right away if you notice unexplained wounds on your fingers and toes while taking this medicine. You should also tell your healthcare provider if you experience numbness or pain, changes in the skin color, or sensitivity to temperature in your fingers or toes.  What side effects may I notice from receiving this medicine?  Side effects that you should report to your doctor or health care professional as soon as possible:  -allergic reactions like skin rash, itching or hives, swelling of the face, lips, or tongue  -changes in vision  -chest pain or chest tightness  -fast, irregular heartbeat  -fingers or toes feel numb, cool, painful  -hallucination, loss of contact with reality  -high blood pressure  -males: prolonged or painful erection  -seizures  -severe headaches  -shortness of breath  -suicidal thoughts or other mood changes  -trouble walking, dizziness, loss of balance or coordination  -uncontrollable head, mouth, neck, arm, or leg movements  -unusual bleeding or bruising  Side effects that  usually do not require medical attention (report to your doctor or health care professional if they continue or are bothersome):  -anxious  -headache  -loss of appetite  -nausea, vomiting  -trouble sleeping  -weight loss  This list may not describe all possible side effects. Call your doctor for medical advice about side effects. You may report side effects to FDA at 1-800-FDA-1088.  Where should I keep my medicine?  Keep out of the reach of children. This medicine can be abused. Keep your medicine in a safe place to protect it from theft. Do not share this medicine with anyone. Selling or giving away this medicine is dangerous and against the law.  This medicine may cause accidental overdose and death if taken by other adults, children, or pets. Mix any unused medicine with a substance like cat litter or coffee grounds. Then throw the medicine away in a sealed container like a sealed bag or a coffee can with a lid. Do not use the medicine after the expiration date.  Store at room temperature between 15 and 30 degrees C (59 and

## 2017-11-05 NOTE — Telephone Encounter (Signed)
Called pt and made her aware I was completing a PA for her. Pt verbalized understanding.

## 2017-11-11 ENCOUNTER — Other Ambulatory Visit: Payer: Self-pay | Admitting: Neurology

## 2017-11-11 MED ORDER — METHYLPHENIDATE HCL ER (LA) 20 MG PO CP24: 20.0000 mg | ORAL_CAPSULE | ORAL | 0 refills | Status: DC

## 2017-11-11 NOTE — Telephone Encounter (Signed)
PA was denied we will attempt to try Ritalin generic long acting medication instead.

## 2017-11-11 NOTE — Progress Notes (Signed)
I changed from methylphenidate ( generic )  to Ritalin long acting 20 mg based on insurance preference,

## 2017-11-12 ENCOUNTER — Telehealth: Payer: Self-pay | Admitting: Neurology

## 2017-11-12 NOTE — Telephone Encounter (Signed)
PA completed for the pt through cover my meds on the medimpact form. KEY: LYYTNT. Can take up to 5 days before hearing if it will be approved.

## 2017-11-13 NOTE — Telephone Encounter (Signed)
Error

## 2017-11-13 NOTE — Progress Notes (Signed)
[facility] Phone: [phone]   Date of Visit: 11/14/2017   HPI:  Patient presents today for a well woman exam.   Concerns today:  Left knee pain: -Reports of intermittent left knee pain for the past 5 years or more -Pain is mainly with walking.  She also feels a popping sensation that is not painful -Denies any swelling to the joint.  Denies any prior injury or surgeries to the joint -Does improve with rest.  She also uses Voltaren gel and Tylenol which does help some -Denies locking sensation or catching sensation.  Denies any falls. 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  She  Mentioned that she has some sensation of food getting stuck in her throat for the past few months.  She denies any history of tobacco use or smoking.  No pain with swallowing.  Occurs with solid foods at times.  No issues with liquids.  No symptoms of heartburn.   Her mother has a history of throat cancer however he was a smoker.  Periods: History of a total vaginal hysterectomy Contraception: Not applicable Pelvic symptoms: Denies any vaginal discharge that is abnormal, denies any vaginal bleeding or irritation Sexual activity: no STD Screening: Declines Pap smear status: total vaginal hysterectomy and bilateral salpingo oophorectomy for cystocele and uterine prolapse. Pathology unremarkable.  Exercise: work two jobs so she does not have a lot of time to dedicate specifically for exercise.  Reports that her job has been on her feet most of the day. Diet: sweet tooth. She thinks that she eats more sweets than real food.  We discussed the importance of trying to change this. Smoking: Denies Alcohol: Denies Drugs: Denies Mood: PHQ 2 Dentist: yes  ROS:  Review of Systems  Constitutional: Negative for chills, fever and weight loss.  HENT: Negative for sore throat.   Eyes: Negative for  blurred vision and double vision.  Respiratory: Negative for cough and shortness of breath.   Cardiovascular: Negative for chest pain, palpitations and leg swelling.  Gastrointestinal: Negative for abdominal pain, blood in stool, constipation, diarrhea, heartburn, melena, nausea and vomiting.       Trouble swallowing at times   Genitourinary: Negative for dysuria and frequency.  Musculoskeletal: Positive for joint pain. Negative for falls.  Skin: Negative for itching and rash.  Neurological: Negative for dizziness, sensory change, focal weakness, loss of consciousness and headaches.  Endo/Heme/Allergies: Negative for polydipsia. Does not bruise/bleed easily.  Psychiatric/Behavioral: Negative for depression and substance abuse.     [place]:  Cancers in family: Sister had colon cancer at the age of 2.  Brother had throat cancer however he was a smoker.  Other family history Was reviewed and updated in the chart.  PMH: HTN HLD HFpEF, PVC Mild Cognitive Impairment  Narcolepsy ADD  PHYSICAL EXAM: BP 132/74   Pulse 63   Temp 98.1 F (36.7 C) (Oral)   Ht 5\' 4"  (1.626 m)   Wt 154 lb 4.8 oz (70 kg)   SpO2 99%   BMI 26.49 kg/m  Gen: NAD, pleasant, cooperative HEENT: NCAT, PERRL, no palpable thyromegaly or anterior cervical lymphadenopathy, oropharynx appears unremarkable Heart: RRR, no murmurs Lungs: CTAB, NWOB Abdomen: soft, nontender to palpation Neuro: grossly nonfocal, speech normal  MSK: Knees appear symmetrical without any swelling or increased warmth.  Right knee without tenderness to palpation on the  joint lines, patella, patella tendon or quadriceps tendon.  Ligaments are intact.  Lower extremity strength is normal.  Normal sensation to light touch.  Left knee with tenderness to palpation of the lateral joint line greater than the medial joint line.  No tenderness of the patella, patella tendon or quadriceps tendon.  Ligaments are intact.  Strength of the lower extremity is  normal.  Normal sensation to light touch of the lower extremity. Extremities: No lower extremity swelling or calf pain bilaterally.  ASSESSMENT/PLAN:  # Health maintenance:  -mammogram: Due in April.  Reminded patient to make this appointment. -lipid screening: Up-to-date.  Will be due in October -immunizations: She declines the shingles vaccine today. she already had a flu vaccine this season.  - Colon cancer screening: Last colonoscopy in 2011 but it was recommended that she repeat this in 5 years due to family history of colon cancer.  I made a referral to GI for this.  Left knee pain: No signs of effusion or infection.  Likely due to osteoarthritis. Unlikely there is a meniscal injury.  No previous imaging on file noted her symptoms.  Be pretty mild.  We discussed that imaging with an x-ray will not likely change her management currently.  Patient agrees to hold off on imaging for now.  Recommended continuing Voltaren gel and Tylenol as needed for her pain.  We will also try strengthening exercises.  She continues to have bothersome symptoms we can try physical therapy as a next step.  Dysphagia: From her symptoms it seems like she has esophageal dysphagia. Patient does not seem to have any risk factors to be concerned about malignancy.   Brother with history of throat cancer however he was a smoker.  Will refer to GI to see if she would benefit from a upper endoscopy.    She has a history of anemia therefore we will get CBC to evaluate hemoglobin.  We will also get a CMP due to her history of hypertension and hyperlipidemia.   Smiley Houseman, MD PGY [place]

## 2017-11-14 ENCOUNTER — Other Ambulatory Visit: Payer: Self-pay

## 2017-11-14 ENCOUNTER — Encounter: Payer: Self-pay | Admitting: Internal Medicine

## 2017-11-14 ENCOUNTER — Ambulatory Visit (INDEPENDENT_AMBULATORY_CARE_PROVIDER_SITE_OTHER): Payer: 59 | Admitting: Internal Medicine

## 2017-11-14 VITALS — BP 132/74 | HR 63 | Temp 98.1°F | Ht 64.0 in | Wt 154.3 lb

## 2017-11-14 DIAGNOSIS — Z0001 Encounter for general adult medical examination with abnormal findings: Secondary | ICD-10-CM

## 2017-11-14 DIAGNOSIS — E785 Hyperlipidemia, unspecified: Secondary | ICD-10-CM | POA: Diagnosis not present

## 2017-11-14 DIAGNOSIS — R1310 Dysphagia, unspecified: Secondary | ICD-10-CM | POA: Diagnosis not present

## 2017-11-14 DIAGNOSIS — Z1211 Encounter for screening for malignant neoplasm of colon: Secondary | ICD-10-CM

## 2017-11-14 DIAGNOSIS — I10 Essential (primary) hypertension: Secondary | ICD-10-CM | POA: Diagnosis not present

## 2017-11-14 DIAGNOSIS — D649 Anemia, unspecified: Secondary | ICD-10-CM | POA: Diagnosis not present

## 2017-11-14 DIAGNOSIS — Z0000 Encounter for general adult medical examination without abnormal findings: Secondary | ICD-10-CM

## 2017-11-14 MED ORDER — SIMVASTATIN 40 MG PO TABS: 40.0000 mg | ORAL_TABLET | Freq: Every day | ORAL | 2 refills | Status: DC

## 2017-11-14 MED FILL — SIMVASTATIN 40 MG TABLET: 40 | 90 days supply | Qty: 90 | Fill #0

## 2017-11-14 NOTE — Patient Instructions (Addendum)
We made a referral to the GI doctor for your colonoscopy and your sensation of having food get stuck in your throat.   We will get labs today.    Annual physical

## 2017-11-14 NOTE — Telephone Encounter (Signed)
PA approved for the medication from 11/14/2017-11/14/18    REF# [id number]

## 2017-11-15 ENCOUNTER — Encounter: Payer: Self-pay | Admitting: Internal Medicine

## 2017-11-15 LAB — CMP14+EGFR
A/G RATIO: 1.6 (ref 1.2–2.2)
ALK PHOS: 99 IU/L (ref 39–117)
ALT: 14 IU/L (ref 0–32)
AST: 16 IU/L (ref 0–40)
Albumin: 4.2 g/dL (ref 3.5–5.5)
BUN/Creatinine Ratio: 16 (ref 9–23)
BUN: 10 mg/dL (ref 6–24)
Bilirubin Total: 0.2 mg/dL (ref 0.0–1.2)
CO2: 27 mmol/L (ref 20–29)
Calcium: 9.1 mg/dL (ref 8.7–10.2)
Chloride: 103 mmol/L (ref 96–106)
Creatinine, Ser: 0.62 mg/dL (ref 0.57–1.00)
GFR calc Af Amer: 114 mL/min/{1.73_m2} (ref >59)
GFR calc non Af Amer: 99 mL/min/{1.73_m2} (ref >59)
GLOBULIN, TOTAL: 2.6 g/dL (ref 1.5–4.5)
Glucose: 86 mg/dL (ref 65–99)
POTASSIUM: 4.4 mmol/L (ref 3.5–5.2)
Sodium: 144 mmol/L (ref 134–144)
Total Protein: 6.8 g/dL (ref 6.0–8.5)

## 2017-11-15 LAB — CBC
HEMATOCRIT: 39.3 % (ref 34.0–46.6)
Hemoglobin: 12.4 g/dL (ref 11.1–15.9)
MCH: 28.8 pg (ref 26.6–33.0)
MCHC: 31.6 g/dL (ref 31.5–35.7)
MCV: 91 fL (ref 79–97)
PLATELETS: 217 10*3/uL (ref 150–379)
RBC: 4.31 x10E6/uL (ref 3.77–5.28)
RDW: 14 % (ref 12.3–15.4)
WBC: 4.2 10*3/uL (ref 3.4–10.8)

## 2017-11-15 NOTE — Progress Notes (Signed)
Sent letter to patient regarding normal lab work.

## 2017-11-19 ENCOUNTER — Other Ambulatory Visit: Payer: Self-pay | Admitting: Family Medicine

## 2017-11-19 DIAGNOSIS — Z1231 Encounter for screening mammogram for malignant neoplasm of breast: Secondary | ICD-10-CM

## 2017-12-05 MED FILL — METHYLPHENIDATE ER 20 MG CA: 20 | 30 days supply | Qty: 30 | Fill #0

## 2017-12-09 ENCOUNTER — Other Ambulatory Visit: Payer: Self-pay | Admitting: Family Medicine

## 2017-12-12 ENCOUNTER — Other Ambulatory Visit: Payer: Self-pay | Admitting: Neurology

## 2017-12-12 MED FILL — LISINOPRIL 5 MG TABLET: 5 | 90 days supply | Qty: 90 | Fill #0

## 2017-12-12 NOTE — Telephone Encounter (Signed)
Will discuss with Dr Brett Fairy what would be the next treatment plan for her.

## 2017-12-12 NOTE — Telephone Encounter (Signed)
Patient presented to the lobby stating that methylphenidate ER 20 mg made her feel very lightheaded and tired. She just wanted the nurse to be aware of the side effects Best call back is [phone]

## 2017-12-17 ENCOUNTER — Ambulatory Visit: Payer: 59 | Admitting: Cardiology

## 2017-12-17 ENCOUNTER — Encounter: Payer: Self-pay | Admitting: Cardiology

## 2017-12-17 VITALS — BP 128/82 | HR 77 | Ht 64.0 in | Wt 150.8 lb

## 2017-12-17 DIAGNOSIS — I5032 Chronic diastolic (congestive) heart failure: Secondary | ICD-10-CM | POA: Diagnosis not present

## 2017-12-17 DIAGNOSIS — R1310 Dysphagia, unspecified: Secondary | ICD-10-CM

## 2017-12-17 DIAGNOSIS — I493 Ventricular premature depolarization: Secondary | ICD-10-CM | POA: Diagnosis not present

## 2017-12-17 NOTE — Patient Instructions (Addendum)
Medication Instructions:  Your physician recommends that you continue on your current medications as directed. Please refer to the Current Medication list given to you today.   Labwork: None ordered  Testing/Procedures: None ordered  Follow-Up: Your physician wants you to follow-up in: [place] will receive a reminder letter in the mail two months in advance. If you don't receive a letter, please call our office to schedule the follow-up appointment.   You have been referred to [facility] / DR. Havery Moros.  Any Other Special Instructions Will Be Listed Below (If Applicable).     If you need a refill on your cardiac medications before your next appointment, please call your pharmacy.

## 2017-12-17 NOTE — Progress Notes (Signed)
12/17/2017 Kathryn Hendricks   01/12/1956  [id number]  Primary Physician Kinnie Feil, MD Primary Cardiologist: Dr. Angelena Form  Reason for Visit/CC: Follow-up for chronic diastolic heart failure, hypertension PVCs  HPI:  Kathryn Hendricks is a 62 y.o. female who is being seen today for routien yearly cardiac evaluation.  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 She has been followed by Dr. Angelena Form and has a history of chronic diastolic heart failure with previous echocardiogram in 2017 showing grade 1 diastolic dysfunction.  She also has hypertension as well as a history of palpitations.  She wore a 48-hour Holter monitor in the past showing PVCs.  In 2017, she underwent a nuclear stress test as part of preoperative clearance prior to undergoing a hysterectomy.  Stress test showed no ischemia. EF normal. She also has narcolepsy.    Today in clinic, she reports that she has done well from a cardiac standpoint. She denies CP. No dyspnea, LEE, orthopnea and PND. No palpitations. EKG today shows normal sinus rhythm.  Heart rate is 77 bpm.  Blood pressure is well controlled at 128/82. This is followed by PCP. She is on lisinopril and recent BMP showed normal renal function and K.   Her only new complaint today is noncardiac in origin.  She reports recent difficulty swallowing solid foods.  No difficulty with liquids.  No painful swallowing.  She also has some difficulty swallowing pills.  She feels that they often get stuck and she is having to drink lots of water to get medications and food down.  She denies any history of smoking.  She has not been evaluated for this.  Current Meds  Medication Sig  . acetaminophen (TYLENOL) 325 MG tablet Take 650 mg by mouth every 6 (six) hours as needed for mild pain or headache.  . albuterol (PROVENTIL HFA;VENTOLIN HFA) 108 (90 Base) MCG/ACT inhaler Inhale 2 puffs into the lungs 4 (four) times daily.  Marland Kitchen aspirin 81 MG EC tablet  Take 1 tablet (81 mg total) by mouth daily.  Marland Kitchen dexmethylphenidate (FOCALIN XR) 20 MG 24 hr capsule Take 1 capsule (20 mg total) by mouth daily.  Marland Kitchen ibuprofen (ADVIL,MOTRIN) 200 MG tablet Take 400 mg by mouth every 6 (six) hours as needed for headache or moderate pain.  Marland Kitchen lisinopril (PRINIVIL,ZESTRIL) 5 MG tablet TAKE 1 TABLET BY MOUTH ONCE DAILY  . methylphenidate (RITALIN LA) 20 MG 24 hr capsule Take 1 capsule (20 mg total) by mouth every morning.  . simvastatin (ZOCOR) 40 MG tablet Take 1 tablet (40 mg total) by mouth at bedtime.   Allergies  Allergen Reactions  . Penicillins Shortness Of Breath, Itching and Swelling    Has patient had a PCN reaction causing immediate rash, facial/tongue/throat swelling, SOB or lightheadedness with hypotension: Yes Has patient had a PCN reaction causing severe rash involving mucus membranes or skin necrosis: No Has patient had a PCN reaction that required hospitalization: No Has patient had a PCN reaction occurring within the last 10 years: No If all of the above answers are "NO", then may proceed with Cephalosporin use.    Past Medical History:  Diagnosis Date  . Abdominal pain, left lower quadrant 11/08/2015  . Abnormal Pap smear 2011  . Anxiety   . Arthritis    lower back and shoulders  . Bronchitis    hx - used abluterol inhaler  . Chronic diastolic heart failure ([place])   . Cognitive decline   . FIBROIDS,  UTERUS 07/02/2009   Qualifier: Diagnosis of  By: Earley Favor MD, Cat    . GERD (gastroesophageal reflux disease)    occasional  . Hyperlipidemia   . Hypertension   . Narcolepsy   . Premature atrial contractions   . PVC's (premature ventricular contractions)   . Seasonal allergies   . SVD (spontaneous vaginal delivery)    x 3   Family History  Problem Relation Age of Onset  . Hypertension Mother   . Diabetes Mother   . Stroke Mother   . CAD Mother 46  . Narcolepsy Mother        not diagnosed  . Hypertension Sister   . Cancer Sister         Colon cancer  . Hypertension Brother   . Cancer Brother        throat  . Diabetes Father   . Heart disease Father    Past Surgical History:  Procedure Laterality Date  . ANTERIOR AND POSTERIOR REPAIR N/A 02/19/2017   Procedure: ANTERIOR (CYSTOCELE);  Surgeon: Bjorn Loser, MD;  Location: [facility];  Service: Urology;  Laterality: N/A;  . APPENDECTOMY    . COLONOSCOPY    . CYSTOSCOPY N/A 02/19/2017   Procedure: CYSTOSCOPY;  Surgeon: Bjorn Loser, MD;  Location: [facility];  Service: Urology;  Laterality: N/A;  . DILATION AND CURETTAGE OF UTERUS  2010  . HYSTEROSCOPY W/ ENDOMETRIAL ABLATION  2010  . SALPINGOOPHORECTOMY Bilateral 02/19/2017   Procedure: SALPINGO OOPHORECTOMY;  Surgeon: Anastasio Auerbach, MD;  Location: [facility];  Service: Gynecology;  Laterality: Bilateral;  . TUBAL LIGATION    . VAGINAL HYSTERECTOMY N/A 02/19/2017   Procedure: HYSTERECTOMY VAGINAL ,  BSO.;  Surgeon: Anastasio Auerbach, MD;  Location: [facility];  Service: Gynecology;  Laterality: N/A;  DO NOT OPEN LAP INSTRUMENTS  . WISDOM TOOTH EXTRACTION     Social History   Socioeconomic History  . Marital status: Divorced    Spouse name: Not on file  . Number of children: 3  . Years of education: 62  . Highest education level: Not on file  Occupational History  . Occupation: Microbiologist: [employer]  . Financial resource strain: Not on file  . Food insecurity:    Worry: Not on file    Inability: Not on file  . Transportation needs:    Medical: Not on file    Non-medical: Not on file  Tobacco Use  . Smoking status: Never Smoker  . Smokeless tobacco: Never Used  Substance and Sexual Activity  . Alcohol use: No    Alcohol/week: 0.0 oz  . Drug use: No  . Sexual activity: Never    Birth control/protection: Post-menopausal, Surgical    Comment: 1st intercourse 35 yo-5 partners  Lifestyle  . Physical activity:    Days per week: Not on file    Minutes per session: Not on file    . Stress: Not on file  Relationships  . Social connections:    Talks on phone: Not on file    Gets together: Not on file    Attends religious service: Not on file    Active member of club or organization: Not on file    Attends meetings of clubs or organizations: Not on file    Relationship status: Not on file  . Intimate partner violence:    Fear of current or ex partner: Not on file    Emotionally abused: Not on file  Physically abused: Not on file    Forced sexual activity: Not on file  Other Topics Concern  . Not on file  Social History Narrative   Caffeine 1 cup coffee in am.     Review of Systems: General: negative for chills, fever, night sweats or weight changes.  Cardiovascular: negative for chest pain, dyspnea on exertion, edema, orthopnea, palpitations, paroxysmal nocturnal dyspnea or shortness of breath Dermatological: negative for rash Respiratory: negative for cough or wheezing Urologic: negative for hematuria Abdominal: negative for nausea, vomiting, diarrhea, bright red blood per rectum, melena, or hematemesis Neurologic: negative for visual changes, syncope, or dizziness All other systems reviewed and are otherwise negative except as noted above.   Physical Exam:  Blood pressure 128/82, pulse 77, height 5\' 4"  (1.626 m), weight 150 lb 12.8 oz (68.4 kg).  General appearance: alert, cooperative and no distress Neck: no carotid bruit and no JVD Lungs: clear to auscultation bilaterally Heart: regular rate and rhythm, S1, S2 normal, no murmur, click, rub or gallop Extremities: extremities normal, atraumatic, no cyanosis or edema Pulses: 2+ and symmetric Skin: Skin color, texture, turgor normal. No rashes or lesions Neurologic: Grossly normal  EKG NSR. 77 bpm  -- personally reviewed   ASSESSMENT AND PLAN:   1. Chronic Diastolic HF: Echo in 4193 showed grade 1 diastolic dysfunction.  She has had no issues with this, denying dyspnea.  No lower extremity  edema, orthopnea or PND.  She appears euvolemic on physical exam today and blood pressure is well controlled.  2. HTN: Controlled on current regimen at 128/82 today.  She is on lisinopril.  This is followed by her PCP.  Recent laboratory work showed normal renal function and potassium levels.  3. PVCs: Denies any recent issues with palpitations.  EKG shows normal sinus rhythm.  Heart rate is well controlled at 77 bpm.   4.  Dysphasia: Place outpatient referral to GI.  5.  Narcolepsy: followed by PCP   Follow-Up w/ Dr. Angelena Form in 1 year or sooner if needed.   Brittainy Ladoris Gene, MHS [facility] 12/17/2017 2:34 PM

## 2017-12-19 ENCOUNTER — Encounter: Payer: Self-pay | Admitting: Cardiology

## 2017-12-19 NOTE — Addendum Note (Signed)
Addended by: Gaetano Net on: 12/19/2017 08:43 AM   Modules accepted: Orders

## 2017-12-23 ENCOUNTER — Ambulatory Visit
Admission: RE | Admit: 2017-12-23 | Discharge: 2017-12-23 | Disposition: A | Discharge status: Home / Self Care | Payer: 59 | Source: Ambulatory Visit | Attending: Family Medicine | Admitting: Family Medicine

## 2017-12-23 DIAGNOSIS — Z1231 Encounter for screening mammogram for malignant neoplasm of breast: Secondary | ICD-10-CM

## 2017-12-23 NOTE — Telephone Encounter (Signed)
I have called the pt to make her aware that I discussed her symptoms and concerns that she was having with the medication. Dr Brett Fairy is stating that she doesn't feel that the other medication focalin will be helpful to her since this medication was not. She is recommending that we go back to the adderall 10mg  once a day that she was on before.  LVM for the pt to call back so that we could discuss further.

## 2018-01-13 ENCOUNTER — Other Ambulatory Visit: Payer: Self-pay | Admitting: Neurology

## 2018-01-13 MED ORDER — AMPHETAMINE-DEXTROAMPHETAMINE 10 MG PO TABS: 10.0000 mg | ORAL_TABLET | Freq: Every day | ORAL | 0 refills | Status: DC

## 2018-01-13 MED FILL — DEXTROAMP-AMP 10 MG TAB: 10 | 30 days supply | Qty: 30 | Fill #0

## 2018-01-13 NOTE — Telephone Encounter (Signed)
Pt called back. She states that the medication she is taking caused forgetfullness and is tired. I have informed her of what Dr Brett Fairy recommended and she has agreed to restart the adderall 10 mg once a day. I will order and send to Dr Brett Fairy for review. I have taken the other medications out of the med list as the patient is not taking them. Pt verbalized understanding and was appreciative.

## 2018-01-13 NOTE — Addendum Note (Signed)
Addended by: Darleen Crocker on: 01/13/2018 09:50 AM   Modules accepted: Orders

## 2018-01-14 ENCOUNTER — Encounter: Payer: Self-pay | Admitting: Family Medicine

## 2018-01-23 ENCOUNTER — Ambulatory Visit (INDEPENDENT_AMBULATORY_CARE_PROVIDER_SITE_OTHER): Payer: 59 | Admitting: Orthopedic Surgery

## 2018-01-23 ENCOUNTER — Encounter (INDEPENDENT_AMBULATORY_CARE_PROVIDER_SITE_OTHER): Payer: Self-pay | Admitting: Orthopedic Surgery

## 2018-01-23 DIAGNOSIS — M75122 Complete rotator cuff tear or rupture of left shoulder, not specified as traumatic: Secondary | ICD-10-CM | POA: Diagnosis not present

## 2018-01-25 ENCOUNTER — Encounter (INDEPENDENT_AMBULATORY_CARE_PROVIDER_SITE_OTHER): Payer: Self-pay | Admitting: Orthopedic Surgery

## 2018-01-25 NOTE — Progress Notes (Signed)
Office Visit Note   Patient: Kathryn Hendricks           Date of Birth: Aug 01, 1956           MRN: [id number] Visit Date: 01/23/2018 Requested by: Kinnie Feil, MD [address] [place],  [address] PCP: Kinnie Feil, MD  Subjective: Chief Complaint  Patient presents with  . Left Shoulder - Pain    HPI: Patient presents for evaluation of left shoulder.  She was last seen in June 2017.  She reports pain for several months.  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  She describes some weakness.  Previous MRI scan showed essentially 95 to 99% rotator cuff tear of the supraspinatus.  Since I have seen her she has had interval hysterectomy.  It is hard for her to raise up that left arm.              ROS: All systems reviewed are negative as they relate to the chief complaint within the history of present illness.  Patient denies  fevers or chills.   Assessment & Plan: Visit Diagnoses:  1. Nontraumatic complete tear of left rotator cuff     Plan: Impression is worsening of left shoulder known rotator cuff tear but with no evidence of frozen shoulder at this time.  Ultrasound examination demonstrates what appears to be more tearing of the supraspinatus and clinically she has more grinding in the shoulder than she had 2 years ago.  FMLA is filled out for approximately to 1/2 days/month.  I think it would be easy for her shoulder to become very irritated with the type of work she is doing  Follow-Up Instructions: Return if symptoms worsen or fail to improve.   Orders:  No orders of the defined types were placed in this encounter.  No orders of the defined types were placed in this encounter.     Procedures: No procedures performed   Clinical Data: No additional findings.  Objective: Vital Signs: There were no vitals taken for this visit.  Physical Exam:   Constitutional: Patient appears well-developed HEENT:  Head:  Normocephalic Eyes:EOM are normal Neck: Normal range of motion Cardiovascular: Normal rate Pulmonary/chest: Effort normal Neurologic: Patient is alert Skin: Skin is warm Psychiatric: Patient has normal mood and affect    Ortho Exam: Ortho exam demonstrates full active and passive range of motion of the right shoulder.  On the left-hand side she is got some weakness to supraspinatus testing as well as more coarse grinding and crepitus on the right compared to the left.  Patient has palpable radial pulses bilaterally.  No other masses lymph adenopathy or skin changes noted in that shoulder girdle region.  No discrete AC joint tenderness to direct palpation.  Specialty Comments:  No specialty comments available.  Imaging: No results found.   PMFS History: Patient Active Problem List   Diagnosis Date Noted  . Narcolepsy and cataplexy 11/05/2017  . Hypersomnia, persistent 11/05/2017  . Intermittent claudication ([place]) 11/05/2017  . Spider bite 06/13/2017  . S/P total hysterectomy 02/19/2017  . PVC (premature ventricular contraction) 12/04/2016  . Supraspinatus tendon tear, left, sequela 07/10/2016  . Left shoulder pain 02/14/2016  . Biceps tendinitis on left 07/11/2015  . Grief reaction 06/21/2015  . Screening for diabetes mellitus (DM) 06/21/2015  . Mild cognitive impairment with memory loss 11/09/2014  . ADD (attention deficit hyperactivity disorder, inattentive type) 11/09/2014  . Narcolepsy without cataplexy 08/17/2013  .  Chronic diastolic heart failure ([place]) 06/30/2013  . HYPERCHOLESTEROLEMIA 07/13/2009  . HYPERTENSION, BENIGN 09/25/2007   Past Medical History:  Diagnosis Date  . Abdominal pain, left lower quadrant 11/08/2015  . Abnormal Pap smear 2011  . Anxiety   . Arthritis    lower back and shoulders  . Bronchitis    hx - used abluterol inhaler  . Chronic diastolic heart failure ([place])   . Cognitive decline   . FIBROIDS, UTERUS 07/02/2009   Qualifier: Diagnosis of   By: Ta MD, Cat    . GERD (gastroesophageal reflux disease)    occasional  . Hyperlipidemia   . Hypertension   . Narcolepsy   . Premature atrial contractions   . PVC's (premature ventricular contractions)   . Seasonal allergies   . SVD (spontaneous vaginal delivery)    x 3    Family History  Problem Relation Age of Onset  . Hypertension Mother   . Diabetes Mother   . Stroke Mother   . CAD Mother 54  . Narcolepsy Mother        not diagnosed  . Hypertension Sister   . Cancer Sister        Colon cancer  . Hypertension Brother   . Cancer Brother        throat  . Diabetes Father   . Heart disease Father     Past Surgical History:  Procedure Laterality Date  . ANTERIOR AND POSTERIOR REPAIR N/A 02/19/2017   Procedure: ANTERIOR (CYSTOCELE);  Surgeon: Bjorn Loser, MD;  Location: [facility];  Service: Urology;  Laterality: N/A;  . APPENDECTOMY    . COLONOSCOPY    . CYSTOSCOPY N/A 02/19/2017   Procedure: CYSTOSCOPY;  Surgeon: Bjorn Loser, MD;  Location: [facility];  Service: Urology;  Laterality: N/A;  . DILATION AND CURETTAGE OF UTERUS  2010  . HYSTEROSCOPY W/ ENDOMETRIAL ABLATION  2010  . SALPINGOOPHORECTOMY Bilateral 02/19/2017   Procedure: SALPINGO OOPHORECTOMY;  Surgeon: Anastasio Auerbach, MD;  Location: [facility];  Service: Gynecology;  Laterality: Bilateral;  . TUBAL LIGATION    . VAGINAL HYSTERECTOMY N/A 02/19/2017   Procedure: HYSTERECTOMY VAGINAL ,  BSO.;  Surgeon: Anastasio Auerbach, MD;  Location: [facility];  Service: Gynecology;  Laterality: N/A;  DO NOT OPEN LAP INSTRUMENTS  . WISDOM TOOTH EXTRACTION     Social History   Occupational History  . Occupation: Microbiologist: [employer]  Tobacco Use  . Smoking status: Never Smoker  . Smokeless tobacco: Never Used  Substance and Sexual Activity  . Alcohol use: No    Alcohol/week: 0.0 oz  . Drug use: No  . Sexual activity: Never    Birth control/protection: Post-menopausal, Surgical    Comment: 1st  intercourse 29 yo-5 partners

## 2018-01-28 ENCOUNTER — Telehealth (INDEPENDENT_AMBULATORY_CARE_PROVIDER_SITE_OTHER): Payer: Self-pay | Admitting: Orthopedic Surgery

## 2018-01-28 NOTE — Telephone Encounter (Signed)
Patient called to check on forms, she said [employer] needed these as soon as possible. She said the forms were turned in a couple of weeks ago but her payment was turned in 5/30. Patient said she left you a vm. Please advise when you're back in your office, thanks! # [phone]

## 2018-03-06 ENCOUNTER — Telehealth: Payer: Self-pay | Admitting: *Deleted

## 2018-03-06 ENCOUNTER — Other Ambulatory Visit: Payer: Self-pay | Admitting: Neurology

## 2018-03-06 MED ORDER — AMPHETAMINE-DEXTROAMPHETAMINE 10 MG PO TABS: 10.0000 mg | ORAL_TABLET | Freq: Every day | ORAL | 0 refills | Status: DC

## 2018-03-06 MED FILL — AMPHETAMINE-DEXTROAMPHETAMI: 10 | 30 days supply | Qty: 30 | Fill #0

## 2018-03-06 NOTE — Telephone Encounter (Signed)
Needs Adderall refilled. Please send to [facility] outpatient [facility].  Would like to pick it up before 4 today if possible.

## 2018-03-06 NOTE — Telephone Encounter (Signed)
I have routed this request to Dr Penumalli for review. The pt is due for the medication and [place] registry was verified. ° °

## 2018-03-21 MED FILL — LISINOPRIL 5 MG TABLET: 5 | 90 days supply | Qty: 90 | Fill #1

## 2018-04-22 ENCOUNTER — Other Ambulatory Visit: Payer: Self-pay | Admitting: Diagnostic Neuroimaging

## 2018-04-22 MED FILL — AMPHETAMINE-DEXTROAMPHETAMI: 10 | 30 days supply | Qty: 30 | Fill #0

## 2018-05-08 ENCOUNTER — Telehealth: Payer: Self-pay | Admitting: Neurology

## 2018-05-08 ENCOUNTER — Encounter: Payer: Self-pay | Admitting: Neurology

## 2018-05-08 ENCOUNTER — Ambulatory Visit: Payer: 59 | Admitting: Neurology

## 2018-05-08 VITALS — BP 122/84 | HR 64 | Ht 64.0 in | Wt 149.0 lb

## 2018-05-08 DIAGNOSIS — F988 Other specified behavioral and emotional disorders with onset usually occurring in childhood and adolescence: Secondary | ICD-10-CM | POA: Diagnosis not present

## 2018-05-08 DIAGNOSIS — G47411 Narcolepsy with cataplexy: Secondary | ICD-10-CM

## 2018-05-08 DIAGNOSIS — G4710 Hypersomnia, unspecified: Secondary | ICD-10-CM

## 2018-05-08 MED ORDER — AMPHETAMINE-DEXTROAMPHETAMINE 10 MG PO TABS: ORAL_TABLET | ORAL | 0 refills | Status: DC

## 2018-05-08 MED ORDER — MODAFINIL 200 MG PO TABS: 200.0000 mg | ORAL_TABLET | Freq: Every day | ORAL | 5 refills | Status: DC

## 2018-05-08 MED FILL — MODAFINIL 200 MG TABLET: 200 | 30 days supply | Qty: 30 | Fill #0

## 2018-05-08 NOTE — Patient Instructions (Signed)
We will use Adderall 10 mg bid for sleep supression , but I need to inform your cardiologist to keep me in the loop if your heart rate/  function changes.

## 2018-05-08 NOTE — Telephone Encounter (Signed)
Pt. States she will call us to schedule 6 mo f/u w/ Dr. Brett Fairy

## 2018-05-08 NOTE — Progress Notes (Signed)
PATIENT: Kathryn Hendricks DOB: 10/30/1955  REASON FOR VISIT: follow up- narcolepsy, mild memory impairment, ADD   PCP Dr. Gwendlyn Deutscher .  HISTORY OF PRESENT ILLNESS: Narcolepsy - EDS, ADD, possible learning disability.     HISTORY: 05-08-2018, Kathryn Hendricks is a longtime patient of mine, she is meanwhile 62 years of age and she has ongoing difficulties with excessive daytime sleepiness.  She reports that she sometimes has the irresistible urge to go to sleep just for a minute or 2 and these naps may only last a couple of minutes maximal 15 but are needed for her to face the next 2 hours of the day.  She needs these naps to recharge and short intervals.  Unfortunately this interferes with her social life and she has been found falling asleep in front of the grocery store when the rest of her family expected her to come in.  She has been treated with Adderall which has helped somewhat with sleepiness but has not had the long-lasting effect.  She has now developed trouble to sleep at night however- and we need to revisit the stimulant medication. Her daughter is very concerned. Epworth Sleepiness score endorsed at 20-24 points, FSS at 57/63 points, she reports not feeling depressed, not having new medical problems or medication changes.      11-05-2017.I have the pleasure to meet today with Kathryn Hendricks on 05 November 2017.  Kathryn Hendricks is a 62 year old African-American right-handed female with a diagnosis of narcolepsy, possibly cataplexy, and a concern of mild memory impairment.  We suspect that the patient had ADD which affected her ability to perform well in school and she may have a learning disability in addition.  She is treated currently with Adderall as a stimulant to keep her from falling asleep but this is not always working or not working long enough to allow her to participate in a full day's work.  It also leaves her still with a very high degree of daytime sleepiness, unsafe to operate machinery or  drive.  Today's Epworth score was endorsed at 20 points out of 24 possible points, she has not yet taken medication for this day.  Her fatigue severity was endorsed at 52 points/ 63 possible points. 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  She also mentioned that she had just a 7th grandchild , 3 month ago.   Today 03/05/17, Kathryn Hendricks is a 62 year old female with a history of narcolepsy and mild memory impairment in ADD. She returns today for follow-up. She reports that she continues to take Adderall 10 mg daily. She does feel that it may not be as effective as it was when she initially started the medication. She says later in the day she is very sleepy. She reports that she does recently had a hysterectomy. She states since then she has noticed increased mood swings. However she does report that she was having some mood swings before the surgery. She does note that she is very forgetful. She currently lives at home alone. She is able to complete all ADLs. She does operate a motor vehicle and occasionally will get lost but is able to correct this. She returns today for an evaluation  HISTORY 06-19-2016 . [name] is a 62 year old  Afro-american single female with a history of narcolepsy, mild memory impairment and ADD.  There is a suspicion of a life long learning disability . She lost  her sister in late October 2016. She is currently taking Adderall 10 mg daily- works fairly well. 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 She states that usually by the end of her shift she is exhausted. She states that sometimes when she goes home if she tries to nap she will sleep for hours. She is able to complete all ADLs independently. She will undergo a hysterectomy within the next couple of months, she recently saw her cardiologist and was told that her cardiac function is fine, according to her. Fatigue severity is still very high for this  patient, a 60 points, the Epworth sleepiness score is still endorsed at 18 points, this makes driving unsafe for her.   04/27/15 ([place]): Kathryn Hendricks is a 62 y.o. female patient seen here as are visitfrom Dr. Gwendlyn Deutscher .The patient was last seen on 01-19-15 .Kathryn Hendricks is a 62 y.o. female seen here as a referral from Dr. Gwendlyn Deutscher for treatment of recently diagnosed Narcolepsy and evaluation of memory concerns. Kathryn Hendricks reports that she had increasing difficulties with sleepiness and felt increasingly fatigued, she was not aware that she had physically exerted herself. Her primary care physician at the time was [name] and he referred her for a sleep study in 2014. She underwent PSG testing on 06-09-13 at the [facility] laboratory, resulting in a very normal PSG , but with a sleep latency of 3 minutes , a REM latency  of 67.5 minutes.  Her sleep efficiency was 85.3% and her total sleep time 331 minutes. There were no significant apneas or hypopneas noted, and her AHI was 3.8. There were no periodic limb movements noted.  Study was deemed valid for an MSLT to follow.  The MSLT documented 5 naps with a mean sleep latency of 0.6 minutes and two naps with sleep REM onsets.  This test is diagnostic for the condition of narcolepsy , the MSLT took place on 08-05-13.   Kathryn Hendricks has endorsed again an Epworth Sleepiness Scale of 24 points at the fatigue severity score of 62 out of a possible 63, points. She had stopped modafinil in January after she felt it had become ineffective and told this to Dr. Halford Chessman , her treating sleep physician, only after the fact.  She reported falling asleep and feeling tired all the time again she has been is especially being concerned because she is falling asleep when driving. She has tried not to drive alone. She was advised that she is not safe to drive or operate machinery !! The patient endorsed no cataplectic attacks. She also stated  that the Provigil made her feel short of breath. Just yesterday she fell asleep at the dinner table when her sister was her Guest of on her. She was very embarrassed. She goes to bed between 7 and 8 PM and usually is asleep promptly. She will sleep through until 5-5:30 AM. She will go once to the bathroom at night on average. He may have throat little periods of waking up but usually is able to fall asleep right away again. Her sleep does not feel restorative to her. She has lots of vivid dreams and sometimes dreams of being chased or by the nightmarish content. She has noticed more trouble with her memory but may be fatigue related but could be an independent finding. She feels sometimes clumsy and unsteady when walking. She is upsetting her family since she begun to repeat herself frequently and has been displacing things. She has more  trouble with spelling.  I would like to add that I was able to review her sleep studies here in person and an echo- cardiogram performed on 06-16-13 with a normal ejection fraction of 65% at grade 1 diastolic dysfunction and mild L a dilated patient. They have been previously no focal neurologic deficits noted.   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 She frequenlty fell asleep in school. She was napping daily after school. Life long complaint of excessive daytimesleepiness.She had a sleep study at the [facility] and  Dr . Halford Chessman has followed her.   Interval history from 8-31-16Chief complaint according to patient : Kathryn Hendricks is here today to discuss the results of an MRI study obtained earlier this summer. She received a call that there were no acute findings but she would like to know what exactly the findings mean. She's also endorsing still a very high degree of daytime sleepiness, 20-24 points . Within our session today  we obtained a Mini-Mental Status Examination .  Kathryn Hendricks MRI of the brain from 01-27-15 documented an underdeveloped splenium of the corpus callosum, a thin lipoma and periventricular FLAIR hyperintensities which we present most likely developmental changes, and no acute findings were seen. This MRI could not be compared to previous MRIs as none were available. Kathryn Hendricks's question if her learning disabilities could be related to an abnormality of the splenium have to be answered as a cautious possibility, this would not explain her hypersomnia and a discussed with her that her excessive daytime sleepiness is not related to the changes in the brain as noted. Her cognitive difficulties have been with her all her life, and a Mini-Mental Status Examination today to exclude the attention and calculation part. The patient scored 25 out of 30 points. This has been a stable finding she was also able to copy an image, she was able to draw a clock face and she named 14 animals in a fluency test.  Sleep habits are as follows: She reports falling asleep in public places, for example in a restaurant in the waiting room etc. she's not able safely to operate machinery she's not able to safely drive because of her excessive daytime sleepiness. She has been prescribed modafinil as well as a stimulants which have given her a little bit more energy but not having a lasting effect. I would be hesitant to consider this patient a good candidate for Xyrem.  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 The patient works 8 to 16.30 .  06-19-2016 . Kathryn Hendricks is a 62 year old  Afro-american single female with a history of narcolepsy, mild memory impairment and ADD.  There is a suspicion of a life long learning  disability . She lost her sister in late October 2016. She is currently taking Adderall 10 mg daily- works fairly well. 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  She states that usually by the end of her shift she is exhausted. She states that sometimes when she goes home if she tries to nap she will sleep for hours. She is able to complete all ADLs independently. She will undergo a hysterectomy within the next couple of months, she recently saw her cardiologist and was told that her cardiac function is fine, according to her.  Fatigue severity is still very high for this patient, a 59 points, the Epworth sleepiness score is still endorsed at 18 points, this makes driving unsafe for her.  She inquires about FMLA for her job.   REVIEW OF SYSTEMS: Out of a complete 14 system review of symptoms, the patient complains only of the following symptoms, and all other reviewed systems are negative.  Easily distracted,  Hypersomnia is persistent.   ALLERGIES: Allergies  Allergen Reactions  . Penicillins Shortness Of Breath, Itching and Swelling    Has patient had a PCN reaction causing immediate rash, facial/tongue/throat swelling, SOB or lightheadedness with hypotension: Yes Has patient had a PCN reaction causing severe rash involving mucus membranes or skin necrosis: No Has patient had a PCN reaction that required hospitalization: No Has patient had a PCN reaction occurring within the last 10 years: No If all of the above answers are "NO", then may proceed with Cephalosporin use.     HOME MEDICATIONS: Outpatient Medications Prior to Visit  Medication Sig Dispense Refill  . albuterol (PROVENTIL HFA;VENTOLIN HFA) 108 (90 Base) MCG/ACT inhaler Inhale 2 puffs into the lungs 4 (four) times daily. 1 Inhaler 4  . amphetamine-dextroamphetamine (ADDERALL) 10 MG tablet TAKE 1 TABLET (10 MG TOTAL) BY MOUTH DAILY WITH BREAKFAST. 30 tablet 0  . aspirin 81 MG EC tablet Take 1 tablet (81 mg total) by mouth daily. 90 tablet 1  . ibuprofen (ADVIL,MOTRIN) 200 MG tablet Take 400 mg by mouth every 6 (six) hours as needed for headache or  moderate pain.    Marland Kitchen lisinopril (PRINIVIL,ZESTRIL) 5 MG tablet TAKE 1 TABLET BY MOUTH ONCE DAILY 90 tablet 1  . simvastatin (ZOCOR) 40 MG tablet Take 1 tablet (40 mg total) by mouth at bedtime. 90 tablet 2  . acetaminophen (TYLENOL) 325 MG tablet Take 650 mg by mouth every 6 (six) hours as needed for mild pain or headache.     No facility-administered medications prior to visit.     PAST MEDICAL HISTORY: Past Medical History:  Diagnosis Date  . Abdominal pain, left lower quadrant 11/08/2015  . Abnormal Pap smear 2011  . Anxiety   . Arthritis    lower back and shoulders  . Bronchitis    hx - used abluterol inhaler  . Chronic diastolic heart failure ([place])   . Cognitive decline   . FIBROIDS, UTERUS 07/02/2009   Qualifier: Diagnosis of  By: Ta MD, Cat    . GERD (gastroesophageal reflux disease)    occasional  . Hyperlipidemia   . Hypertension   . Narcolepsy   . Premature atrial contractions   . PVC's (premature ventricular contractions)   . Seasonal allergies   . SVD (spontaneous vaginal delivery)    x 3    PAST SURGICAL HISTORY: Past Surgical History:  Procedure Laterality Date  . ANTERIOR AND POSTERIOR REPAIR N/A 02/19/2017   Procedure: ANTERIOR (CYSTOCELE);  Surgeon: Bjorn Loser, MD;  Location: [facility];  Service: Urology;  Laterality: N/A;  . APPENDECTOMY    . COLONOSCOPY    . CYSTOSCOPY N/A 02/19/2017   Procedure: CYSTOSCOPY;  Surgeon: Bjorn Loser, MD;  Location: [facility];  Service: Urology;  Laterality: N/A;  . DILATION AND CURETTAGE OF UTERUS  2010  . HYSTEROSCOPY W/ ENDOMETRIAL ABLATION  2010  . SALPINGOOPHORECTOMY Bilateral 02/19/2017   Procedure: SALPINGO OOPHORECTOMY;  Surgeon: Anastasio Auerbach, MD;  Location: [facility];  Service: Gynecology;  Laterality: Bilateral;  . TUBAL LIGATION    . VAGINAL HYSTERECTOMY N/A 02/19/2017  Procedure: HYSTERECTOMY VAGINAL ,  BSO.;  Surgeon: Anastasio Auerbach, MD;  Location: [facility];  Service: Gynecology;  Laterality: N/A;   DO NOT OPEN LAP INSTRUMENTS  . WISDOM TOOTH EXTRACTION      FAMILY HISTORY: Family History  Problem Relation Age of Onset  . Hypertension Mother   . Diabetes Mother   . Stroke Mother   . CAD Mother 55  . Narcolepsy Mother        not diagnosed  . Hypertension Sister   . Cancer Sister        Colon cancer  . Hypertension Brother   . Cancer Brother        throat  . Diabetes Father   . Heart disease Father     SOCIAL HISTORY: Social History   Socioeconomic History  . Marital status: Divorced    Spouse name: Not on file  . Number of children: 3  . Years of education: 17  . Highest education level: Not on file  Occupational History  . Occupation: Microbiologist: [employer]  . Financial resource strain: Not on file  . Food insecurity:    Worry: Not on file    Inability: Not on file  . Transportation needs:    Medical: Not on file    Non-medical: Not on file  Tobacco Use  . Smoking status: Never Smoker  . Smokeless tobacco: Never Used  Substance and Sexual Activity  . Alcohol use: No    Alcohol/week: 0.0 standard drinks  . Drug use: No  . Sexual activity: Never    Birth control/protection: Post-menopausal, Surgical    Comment: 1st intercourse 64 yo-5 partners  Lifestyle  . Physical activity:    Days per week: Not on file    Minutes per session: Not on file  . Stress: Not on file  Relationships  . Social connections:    Talks on phone: Not on file    Gets together: Not on file    Attends religious service: Not on file    Active member of club or organization: Not on file    Attends meetings of clubs or organizations: Not on file    Relationship status: Not on file  . Intimate partner violence:    Fear of current or ex partner: Not on file    Emotionally abused: Not on file    Physically abused: Not on file    Forced sexual activity: Not on file  Other Topics Concern  . Not on file  Social History Narrative   Caffeine 1 cup  coffee in am.      PHYSICAL EXAM  Vitals:   05/08/18 0924  BP: 122/84  Pulse: 64  Weight: 149 lb (67.6 kg)  Height: 5\' 4"  (1.626 m)   Body mass index is 25.58 kg/m.  MMSE - Mini Mental State Exam 03/05/2017 06/19/2016 11/03/2015  Orientation to time 4 4 5   Orientation to Place 5 5 5   Registration 3 3 3   Attention/ Calculation 0 2 4  Recall 1 2 2   Language- name 2 objects 2 2 2   Language- repeat 0 0 0  Language- follow 3 step command 3 3 2   Language- read & follow direction 1 1 1   Write a sentence 0 0 1  Copy design 1 0 0  Total score 20 22 25     Finished High school , had special education classes.  Writing and calculation ability is poor.   General: The  patient is awake, alert and appears not in acute distress. The patient is well groomed. Head: Normocephalic, atraumatic. Neck is supple. Mallampati 3, neck circumference:13 inches  Cardiovascular:  Regular rate and rhythm , without  murmurs or carotid bruit, and without distended neck veins. Respiratory: Lungs are clear to auscultation.Skin:  Without evidence of edema, or rash  Neurologic exam : The patient is awake and alert, oriented to place and time. Memory subjective described as impaired- There is a reduced attention span & concentration ability. Speech is fluent with mild  dysarthria, dysphonia or aphasia. Mood and affect are appropriate.  Cranial nerves: Pupils are equal and briskly reactive to light. Extraocular movements  in vertical and horizontal planes intact and without nystagmus. Visual fields by finger perimetry are intact.Hearing to finger rub intact.  Facial sensation intact to fine touch. Facial motor strength is symmetric and tongue and uvula move midline.  Motor exam:   Normal tone and normal muscle bulk and symmetric normal strength in all extremities. Sensory:  Fine touch, pinprick and vibration were  normal. Coordination:  Finger-to-nose maneuver without evidence of ataxia, dysmetria or tremor. Gait  and station: Patient walks without assistive device. Strength within normal limits. Stance is stable and normal- Steps are unfragmented. Romberg testing is normal. Deep tendon reflexes: in the  upper and lower extremities are symmetric, 2 plus. No clonus.     DIAGNOSTIC DATA (LABS, IMAGING, TESTING) - I reviewed patient records, labs, notes, testing and imaging myself where available. Labs from 2018 were reviewed.  New family history - a cousin was diagnosed with narcolepsy after sleep related  MVA - her grandson is diagnosed with ADD. 2 grandchildren are very sleepy.   ASSESSMENT AND PLAN:  1.Narcolepsy poorly controlled on Adderall, one a day 10 mg. No HTN resulting. persistent hypersomnia in narcolepsy.  She is easier irritated and wonders if this Adderall. ( likely, it is ) - she falls still asleep, needs to arrange for naps.  Cataplexy described as "dropping objects" when startled.  2. Atrial fibrillation and adderall are not a good mix, but she is unable to function on ritalin- we may need to try Provigil or Nuvigil again.  3.Memory impairment, subjective - MMSE, dyslexia.  4. ADD, adult - benefit from Adderall.  1)Overall the patient is doing well. Methylphenidate replaces Adderall for now- see if this works without  Causing impulsivity.  2) The patient's memory has decreased - daughter Victorino December is very concerned.    She will follow-up in 3-4 months with NP to see if Ritalin works. She needs a  MMSE every 6 month !Asencion Partridge Neka Bise, MD  05/08/2018, 9:51 AM [facility] [address] [place],  [address] [phone]

## 2018-05-13 LAB — NARCOLEPSY EVALUATION
DQB1*06:02: NEGATIVE
HLA-DQ ALPHA: NEGATIVE

## 2018-05-14 ENCOUNTER — Telehealth: Payer: Self-pay | Admitting: Neurology

## 2018-05-14 NOTE — Telephone Encounter (Signed)
-----   Message from Larey Seat, MD sent at 05/13/2018  5:44 PM EDT ----- Narcolepsy alleles negative- hypersomnia patient

## 2018-05-14 NOTE — Telephone Encounter (Signed)
Called the patient and informed her that her narcolepsy lab test came back negative for her carrying the gene. Pt was diagnosed through a sleep study several years ago with narcolepsy. At the last visit  Dr Brett Fairy started the patient on modafinil and pt still has adderall for her ADD. Patient states that with the modafinil her heart is racing and she feels winded. I instructed to try breaking the 200 mg tablet in half and see if she is better with doing that. She is still taking the adderall as well. The two together may had just been a little too much at once. Pt verbalized understanding. Pt will attempt this

## 2018-05-20 MED FILL — AMPHETAMINE SALTS 10 MG TAB: 10 | 30 days supply | Qty: 60 | Fill #0

## 2018-07-18 ENCOUNTER — Other Ambulatory Visit: Payer: Self-pay | Admitting: Family Medicine

## 2018-07-18 ENCOUNTER — Telehealth: Payer: Self-pay | Admitting: Neurology

## 2018-07-18 MED FILL — LISINOPRIL 5 MG TABLET: 5 | 90 days supply | Qty: 90 | Fill #0

## 2018-07-18 NOTE — Telephone Encounter (Signed)
Patient states that amphetamine salts is not agreeing with her. She would like a call back from the nurse. Best call back is [phone]

## 2018-07-21 NOTE — Telephone Encounter (Signed)
Called the patient back. No answer. LVM informing the patient that I had gotten her message. Instructed the pt to call back and leave details on what is going on with the medication. Pt should just be taken the adderall tablets which is what she had been on before. All other meds the pt has tried she hasn't had success with. Informed her to call and discuss more.

## 2018-09-23 DIAGNOSIS — H524 Presbyopia: Secondary | ICD-10-CM | POA: Diagnosis not present

## 2018-10-16 ENCOUNTER — Encounter: Payer: Self-pay | Admitting: Gynecology

## 2018-10-16 ENCOUNTER — Ambulatory Visit (INDEPENDENT_AMBULATORY_CARE_PROVIDER_SITE_OTHER): Payer: 59 | Admitting: Gynecology

## 2018-10-16 VITALS — BP 124/82 | Ht 65.0 in | Wt 146.0 lb

## 2018-10-16 DIAGNOSIS — N952 Postmenopausal atrophic vaginitis: Secondary | ICD-10-CM

## 2018-10-16 DIAGNOSIS — Z01419 Encounter for gynecological examination (general) (routine) without abnormal findings: Secondary | ICD-10-CM

## 2018-10-16 NOTE — Progress Notes (Signed)
    Kathryn Hendricks 02/06/1956 [id number]        62 y.o.  [id number] for annual gynecologic exam.  Doing well without gynecologic complaints.  Past medical history,surgical history, problem list, medications, allergies, family history and social history were all reviewed and documented as reviewed in the EPIC chart.  ROS:  Performed with pertinent positives and negatives included in the history, assessment and plan.   Additional significant findings : None   Exam: [name] assistant Vitals:   10/16/18 1410  BP: 124/82  Weight: 146 lb (66.2 kg)  Height: 5\' 5"  (1.651 m)   Body mass index is 24.3 kg/m.  General appearance:  Normal affect, orientation and appearance. Skin: Grossly normal HEENT: Without gross lesions.  No cervical or supraclavicular adenopathy. Thyroid normal.  Lungs:  Clear without wheezing, rales or rhonchi Cardiac: RR, without RMG Abdominal:  Soft, nontender, without masses, guarding, rebound, organomegaly or hernia Breasts:  Examined lying and sitting without masses, retractions, discharge or axillary adenopathy. Pelvic:  Ext, BUS, Vagina: With atrophic changes  Adnexa: Without masses or tenderness    Anus and perineum: Normal   Rectovaginal: Normal sphincter tone without palpated masses or tenderness.    Assessment/Plan:  63 y.o. [id number] female for annual gynecologic exam.  1. Postmenopausal.  Status post TVH BSO A&P repair 2018.  Doing well with good support on exam. 2. Mammography coming due end of April/May.  Reminded patient to schedule.  Breast exam normal today. 3. Colonoscopy 2016.  Repeat at their recommended interval. 4. Pap smear/HPV 06/2015.  No Pap smear done today.  No history of significant abnormal Pap smears.  Options to stop screening per current screening guidelines based on hysterectomy reviewed.  Will readdress on an annual basis. 5. DEXA never.  Will go ahead and schedule now as she has turned 62. 6. Health maintenance.  No routine lab  work done as patient does this elsewhere.  Follow-up for bone density.  Follow-up for annual exam in 1 year.   Anastasio Auerbach MD, 2:25 PM 10/16/2018

## 2018-10-16 NOTE — Patient Instructions (Signed)
Follow-up for bone density as scheduled  Follow-up for annual exam in 1 year   

## 2018-11-27 MED FILL — LISINOPRIL 5 MG TABLET: 5 | 90 days supply | Qty: 90 | Fill #0

## 2018-12-18 ENCOUNTER — Other Ambulatory Visit: Payer: Self-pay | Admitting: Neurology

## 2018-12-18 ENCOUNTER — Telehealth: Payer: Self-pay | Admitting: Neurology

## 2018-12-18 MED ORDER — AMPHETAMINE-DEXTROAMPHETAMINE 10 MG PO TABS: ORAL_TABLET | ORAL | 0 refills | Status: DC

## 2018-12-18 NOTE — Telephone Encounter (Addendum)
I have routed this request to Dr Brett Fairy for review. The pt is due for the medication and [place] registry was verified. Due to  outpt pharmacy being closed the script will be send to [facility] outpatient.

## 2018-12-18 NOTE — Telephone Encounter (Signed)
Pt called in and needed a refill on her amphetamine-dextroamphetamine (ADDERALL) 10 MG tablet to be sent to [place] [facility], [place].

## 2018-12-19 ENCOUNTER — Telehealth: Payer: Self-pay | Admitting: Neurology

## 2018-12-19 ENCOUNTER — Other Ambulatory Visit: Payer: Self-pay | Admitting: Neurology

## 2018-12-19 MED ORDER — AMPHETAMINE-DEXTROAMPHETAMINE 10 MG PO TABS: ORAL_TABLET | ORAL | 0 refills | Status: DC

## 2018-12-19 MED FILL — AMPHETAMINE-DEXTROAMPHETAMI: 10 | 30 days supply | Qty: 60 | Fill #0

## 2018-12-19 NOTE — Telephone Encounter (Signed)
I called the patient.  The patient needs a prescription for the Adderall sent in.  The note by Dr. Brett Fairy indicates that she was going to send the prescription to [facility], and then she sent her to [facility].  I sent the prescription to [facility].

## 2018-12-19 NOTE — Telephone Encounter (Signed)
Refilled, but apparently not successful yesterday 12-18-2018, repeated today.

## 2019-02-05 ENCOUNTER — Other Ambulatory Visit: Payer: Self-pay | Admitting: Family Medicine

## 2019-02-05 DIAGNOSIS — Z1231 Encounter for screening mammogram for malignant neoplasm of breast: Secondary | ICD-10-CM

## 2019-02-18 ENCOUNTER — Ambulatory Visit
Admission: RE | Admit: 2019-02-18 | Discharge: 2019-02-18 | Disposition: A | Discharge status: Home / Self Care | Payer: 59 | Source: Ambulatory Visit | Attending: Family Medicine | Admitting: Family Medicine

## 2019-02-18 ENCOUNTER — Other Ambulatory Visit: Payer: Self-pay

## 2019-02-18 DIAGNOSIS — Z1231 Encounter for screening mammogram for malignant neoplasm of breast: Secondary | ICD-10-CM | POA: Diagnosis not present

## 2019-03-10 ENCOUNTER — Ambulatory Visit (INDEPENDENT_AMBULATORY_CARE_PROVIDER_SITE_OTHER): Payer: 59 | Admitting: Family Medicine

## 2019-03-10 ENCOUNTER — Other Ambulatory Visit: Payer: Self-pay

## 2019-03-10 ENCOUNTER — Encounter: Payer: Self-pay | Admitting: Family Medicine

## 2019-03-10 VITALS — BP 140/86 | HR 59 | Ht 65.0 in | Wt 153.1 lb

## 2019-03-10 DIAGNOSIS — I5032 Chronic diastolic (congestive) heart failure: Secondary | ICD-10-CM

## 2019-03-10 DIAGNOSIS — L608 Other nail disorders: Secondary | ICD-10-CM | POA: Diagnosis not present

## 2019-03-10 DIAGNOSIS — Z0000 Encounter for general adult medical examination without abnormal findings: Secondary | ICD-10-CM

## 2019-03-10 DIAGNOSIS — I10 Essential (primary) hypertension: Secondary | ICD-10-CM | POA: Diagnosis not present

## 2019-03-10 DIAGNOSIS — E7800 Pure hypercholesterolemia, unspecified: Secondary | ICD-10-CM

## 2019-03-10 DIAGNOSIS — R200 Anesthesia of skin: Secondary | ICD-10-CM | POA: Diagnosis not present

## 2019-03-10 DIAGNOSIS — I739 Peripheral vascular disease, unspecified: Secondary | ICD-10-CM

## 2019-03-10 DIAGNOSIS — E785 Hyperlipidemia, unspecified: Secondary | ICD-10-CM

## 2019-03-10 NOTE — Assessment & Plan Note (Signed)
Stable on current regimen. Continue same. 

## 2019-03-10 NOTE — Assessment & Plan Note (Signed)
No acute exacerbation. Monitor

## 2019-03-10 NOTE — Progress Notes (Signed)
Subjective:     Kathryn Hendricks is a 63 y.o. female and is here for a comprehensive physical exam. The patient reports problems - Left middle finger numbness x 1 year and nail discoloration x 2 years. No change in the discoloration.. Sometimes left feet numbness intermittently. Currently she is asymptomatic. Her hand numbness is worse with cold weather and her foot is worse when she lay down. She got her mammogram and she recently saw her OB/GYN who advised her that she does not need PAP. HTN/HLD:Compliant with meds, here f/u follow-up. CHF/Claudication: Denies any symptoms, feels well.  Social History   Socioeconomic History  . Marital status: Divorced    Spouse name: Not on file  . Number of children: 3  . Years of education: 73  . Highest education level: Not on file  Occupational History  . Occupation: Microbiologist: [employer]  . Financial resource strain: Not on file  . Food insecurity    Worry: Not on file    Inability: Not on file  . Transportation needs    Medical: Not on file    Non-medical: Not on file  Tobacco Use  . Smoking status: Never Smoker  . Smokeless tobacco: Never Used  Substance and Sexual Activity  . Alcohol use: No    Alcohol/week: 0.0 standard drinks  . Drug use: No  . Sexual activity: Never    Birth control/protection: Post-menopausal, Surgical    Comment: 1st intercourse 21 yo-5 partners  Lifestyle  . Physical activity    Days per week: Not on file    Minutes per session: Not on file  . Stress: Not on file  Relationships  . Social Herbalist on phone: Not on file    Gets together: Not on file    Attends religious service: Not on file    Active member of club or organization: Not on file    Attends meetings of clubs or organizations: Not on file    Relationship status: Not on file  . Intimate partner violence    Fear of current or ex partner: Not on file    Emotionally abused: Not on file    Physically  abused: Not on file    Forced sexual activity: Not on file  Other Topics Concern  . Not on file  Social History Narrative   Caffeine 1 cup coffee in am.   Health Maintenance  Topic Date Due  . INFLUENZA VACCINE  03/28/2019  . COLONOSCOPY  04/28/2020  . PAP SMEAR-Modifier  07/14/2020  . MAMMOGRAM  02/17/2021  . TETANUS/TDAP  06/15/2027  . Hepatitis C Screening  Completed  . HIV Screening  Completed    The following portions of the patient's history were reviewed and updated as appropriate: allergies, current medications, past family history, past medical history, past social history, past surgical history and problem list.  Review of Systems Pertinent items noted in HPI and remainder of comprehensive ROS otherwise negative.   Objective:    BP 140/86   Pulse (!) 59   Ht 5\' 5"  (1.651 m)   Wt 153 lb 2 oz (69.5 kg)   SpO2 99%   BMI 25.48 kg/m  General appearance: alert, cooperative and appears stated age Head: Normocephalic, without obvious abnormality, atraumatic Eyes: conjunctivae/corneas clear. PERRL, EOM's intact. Fundi benign. Ears: normal TM's and external ear canals both ears Throat: lips, mucosa, and tongue normal; teeth and gums normal Neck: no adenopathy, no carotid  bruit, no JVD, supple, symmetrical, trachea midline and thyroid not enlarged, symmetric, no tenderness/mass/nodules Lungs: clear to auscultation bilaterally Heart: regular rate and rhythm, S1, S2 normal, no murmur, click, rub or gallop Abdomen: soft, non-tender; bowel sounds normal; no masses,  no organomegaly Pelvic: deferred and usually gets it done with her OB/GYN Extremities: extremities normal, atraumatic, no cyanosis or edema Skin: Skin color, texture, turgor normal. No rashes or lesions. Dark linear discoloration of her middle finger nail Neurologic: Alert and oriented X 3, normal strength and tone. Normal symmetric reflexes. Normal coordination and gait      Assessment:    Healthy female  exam.     HTN HLD CHF Claudication Numbness and finger discoloration Plan:     See After Visit Summary for Counseling Recommendations Patient ID: Kathryn Hendricks, female   DOB: 1956/05/11, 63 y.o.   MRN: [id number]  Well appearing, normal exam except for positive findings on physical exams.  She is up to date with mammogram screen. Due for colonoscopy next year and she will schedule with her GI. I discussed PAP. Due next year per record, however, she had total hysterectomy. She stated that she discussed with her Gyn specialist and they both agreed to stop screening for now. I will update preferance on epic and differ further eval and discussion to her OB/GYN.  BP looks good. Obtain Bmet and FLP for hyperlipidemia as well. \CHF and Claudication stable.  Concern about melanoma of her fingernail. Referral to Derm placed.  No neurologic deficit on exam of her UL and LL.  Obtain nerve stimulation test vs neuro referral given persistent symptoms.  ?? Reynaud given worsening of her symptoms in cold weather.

## 2019-03-10 NOTE — Assessment & Plan Note (Signed)
FLP checked today.

## 2019-03-10 NOTE — Patient Instructions (Signed)
Paresthesia Paresthesia is a burning or prickling feeling. This feeling can happen in any part of the body. It often happens in the hands, arms, legs, or feet. Usually, it is not painful. In most cases, the feeling goes away in a short time and is not a sign of a serious problem. If you have paresthesia that lasts a long time, you may need to be seen by your doctor. Follow these instructions at home: Alcohol use   Do not drink alcohol if: ? Your doctor tells you not to drink. ? You are pregnant, may be pregnant, or are planning to become pregnant.  If you drink alcohol: ? Limit how much you use to:  0-1 drink a day for women.  0-2 drinks a day for men. ? Be aware of how much alcohol is in your drink. In the [place] one drink equals one 12 oz bottle of beer (355 mL), one 5 oz glass of wine (148 mL), or one 1 oz glass of hard liquor (44 mL). Nutrition   Eat a healthy diet. This includes: ? Eating foods that have a lot of fiber in them, such as fresh fruits and vegetables, whole grains, and beans. ? Limiting foods that have a lot of fat and processed sugars in them, such as fried or sweet foods. General instructions  Take over-the-counter and prescription medicines only as told by your doctor.  Do not use any products that have nicotine or tobacco in them, such as cigarettes and e-cigarettes. If you need help quitting, ask your doctor.  If you have diabetes, work with your doctor to make sure your blood sugar stays in a healthy range.  If your feet feel numb: ? Check for redness, warmth, and swelling every day. ? Wear padded socks and comfortable shoes. These help protect your feet.  Keep all follow-up visits as told by your doctor. This is important. Contact a doctor if:  You have paresthesia that gets worse or does not go away.  Your burning or prickling feeling gets worse when you walk.  You have pain or cramps.  You feel dizzy.  You have a rash. Get help right away if  you:  Feel weak.  Have trouble walking or moving.  Have problems speaking, understanding, or seeing.  Feel confused.  Cannot control when you pee (urinate) or poop (have a bowel movement).  Lose feeling (have numbness) after an injury.  Have new weakness in an arm or leg.  Pass out (faint). Summary  Paresthesia is a burning or prickling feeling. It often happens in the hands, arms, legs, or feet.  In most cases, the feeling goes away in a short time and is not a sign of a serious problem.  If you have paresthesia that lasts a long time, you may need to be seen by your doctor. This information is not intended to replace advice given to you by your health care provider. Make sure you discuss any questions you have with your health care provider. Document Released: 07/26/2008 Document Revised: 09/08/2018 Document Reviewed: 08/22/2017 Elsevier Patient Education  2020 [employer].

## 2019-03-10 NOTE — Assessment & Plan Note (Addendum)
No recent issue. Monitor closely. Will d/c from problem list if it remains stable.

## 2019-03-11 ENCOUNTER — Other Ambulatory Visit: Payer: Self-pay | Admitting: Family Medicine

## 2019-03-11 LAB — LIPID PANEL
Chol/HDL Ratio: 3.4 ratio (ref 0.0–4.4)
Cholesterol, Total: 226 mg/dL — ABNORMAL HIGH (ref 100–199)
HDL: 67 mg/dL (ref >39)
LDL Calculated: 145 mg/dL — ABNORMAL HIGH (ref 0–99)
Triglycerides: 71 mg/dL (ref 0–149)
VLDL Cholesterol Cal: 14 mg/dL (ref 5–40)

## 2019-03-11 LAB — BASIC METABOLIC PANEL
BUN/Creatinine Ratio: 26 (ref 12–28)
BUN: 17 mg/dL (ref 8–27)
CO2: 25 mmol/L (ref 20–29)
Calcium: 9.4 mg/dL (ref 8.7–10.3)
Chloride: 104 mmol/L (ref 96–106)
Creatinine, Ser: 0.66 mg/dL (ref 0.57–1.00)
GFR calc Af Amer: 110 mL/min/{1.73_m2} (ref >59)
GFR calc non Af Amer: 96 mL/min/{1.73_m2} (ref >59)
Glucose: 86 mg/dL (ref 65–99)
Potassium: 4.8 mmol/L (ref 3.5–5.2)
Sodium: 143 mmol/L (ref 134–144)

## 2019-03-11 LAB — TSH: TSH: 1.82 u[IU]/mL (ref 0.450–4.500)

## 2019-03-11 MED ORDER — SIMVASTATIN 40 MG PO TABS: 40.0000 mg | ORAL_TABLET | Freq: Every day | ORAL | 2 refills | Status: DC

## 2019-03-11 MED FILL — SIMVASTATIN 40 MG TABLET: 40 | 90 days supply | Qty: 90 | Fill #0

## 2019-03-11 NOTE — Telephone Encounter (Signed)
FLP discussed. She confessed that she had not taken her Statin in over a year. Med refilled. She agreed to get back on it.

## 2019-03-13 ENCOUNTER — Telehealth: Payer: Self-pay

## 2019-03-13 NOTE — Telephone Encounter (Signed)
LM for pt to call back to schedule 1 yr f/u with CM.

## 2019-03-19 ENCOUNTER — Other Ambulatory Visit: Payer: Self-pay | Admitting: Neurology

## 2019-03-19 MED FILL — LISINOPRIL 5 MG TAB: 5 | 90 days supply | Qty: 90 | Fill #0

## 2019-03-19 MED FILL — AMPHETAMINE-DEXTROAMPHETAMI: 10 | 30 days supply | Qty: 60 | Fill #0

## 2019-03-19 NOTE — Telephone Encounter (Signed)
[place] drug registry okay. She has FU scheduled. Sent to Dr Brett Fairy.

## 2019-04-15 ENCOUNTER — Ambulatory Visit: Payer: 59 | Admitting: Neurology

## 2019-04-28 DIAGNOSIS — M8580 Other specified disorders of bone density and structure, unspecified site: Secondary | ICD-10-CM

## 2019-04-28 HISTORY — DX: Other specified disorders of bone density and structure, unspecified site: M85.80

## 2019-05-12 ENCOUNTER — Ambulatory Visit: Payer: 59 | Admitting: Neurology

## 2019-05-19 ENCOUNTER — Other Ambulatory Visit: Payer: Self-pay

## 2019-05-19 ENCOUNTER — Ambulatory Visit (INDEPENDENT_AMBULATORY_CARE_PROVIDER_SITE_OTHER): Payer: 59

## 2019-05-19 ENCOUNTER — Other Ambulatory Visit: Payer: Self-pay | Admitting: Gynecology

## 2019-05-19 DIAGNOSIS — Z01419 Encounter for gynecological examination (general) (routine) without abnormal findings: Secondary | ICD-10-CM

## 2019-05-19 DIAGNOSIS — M8589 Other specified disorders of bone density and structure, multiple sites: Secondary | ICD-10-CM

## 2019-05-19 DIAGNOSIS — Z780 Asymptomatic menopausal state: Secondary | ICD-10-CM

## 2019-05-19 DIAGNOSIS — Z13820 Encounter for screening for osteoporosis: Secondary | ICD-10-CM

## 2019-05-20 ENCOUNTER — Other Ambulatory Visit: Payer: Self-pay | Admitting: Neurology

## 2019-05-20 ENCOUNTER — Telehealth: Payer: Self-pay | Admitting: Neurology

## 2019-05-20 NOTE — Telephone Encounter (Signed)
I have routed this request to Dr Dohmeier for review. The pt is due for the medication and [place] registry was verified.  

## 2019-05-20 NOTE — Telephone Encounter (Signed)
Pt called needing a refill on her amphetamine-dextroamphetamine (ADDERALL) 10 MG tablet sent to the [facility]

## 2019-05-21 ENCOUNTER — Encounter: Payer: Self-pay | Admitting: Gynecology

## 2019-05-21 MED FILL — AMPHETAMINE-DEXTROAMPHETAMI: 10 | 30 days supply | Qty: 60 | Fill #0

## 2019-05-26 ENCOUNTER — Encounter: Payer: Self-pay | Admitting: Gynecology

## 2019-06-05 ENCOUNTER — Telehealth: Payer: Self-pay

## 2019-06-05 NOTE — Telephone Encounter (Signed)
My Chart email returned unread. I called and left messge to call me.  "Kathryn Hendricks,  Your recent bone density does show osteopenia which is some loss of calcium from the bones but not to the extent of osteoporosis.  We do a theoretical fracture risk calculation to help [place] determine whether we think medication is indicated at this time.  Your calculation does not show the need for any medication at this time.  Weightbearing exercise on a regular basis such as walking is beneficial for the bones along with adequate calcium and vitamin D intake.  I would recommend repeating the bone density in 2 years.  Dr. Phineas Real"

## 2019-06-16 ENCOUNTER — Other Ambulatory Visit: Payer: Self-pay

## 2019-06-16 ENCOUNTER — Encounter: Payer: Self-pay | Admitting: Neurology

## 2019-06-16 ENCOUNTER — Ambulatory Visit (INDEPENDENT_AMBULATORY_CARE_PROVIDER_SITE_OTHER): Payer: 59 | Admitting: Neurology

## 2019-06-16 VITALS — BP 134/80 | Temp 97.9°F | Ht 65.0 in | Wt 156.0 lb

## 2019-06-16 DIAGNOSIS — I739 Peripheral vascular disease, unspecified: Secondary | ICD-10-CM | POA: Diagnosis not present

## 2019-06-16 DIAGNOSIS — I493 Ventricular premature depolarization: Secondary | ICD-10-CM

## 2019-06-16 DIAGNOSIS — M25512 Pain in left shoulder: Secondary | ICD-10-CM | POA: Diagnosis not present

## 2019-06-16 DIAGNOSIS — G47411 Narcolepsy with cataplexy: Secondary | ICD-10-CM

## 2019-06-16 DIAGNOSIS — R200 Anesthesia of skin: Secondary | ICD-10-CM

## 2019-06-16 DIAGNOSIS — G4710 Hypersomnia, unspecified: Secondary | ICD-10-CM

## 2019-06-16 DIAGNOSIS — R5382 Chronic fatigue, unspecified: Secondary | ICD-10-CM | POA: Diagnosis not present

## 2019-06-16 DIAGNOSIS — I10 Essential (primary) hypertension: Secondary | ICD-10-CM

## 2019-06-16 DIAGNOSIS — G8929 Other chronic pain: Secondary | ICD-10-CM

## 2019-06-16 DIAGNOSIS — I5032 Chronic diastolic (congestive) heart failure: Secondary | ICD-10-CM | POA: Diagnosis not present

## 2019-06-16 DIAGNOSIS — G3184 Mild cognitive impairment, so stated: Secondary | ICD-10-CM

## 2019-06-16 MED ORDER — SUNOSI 150 MG PO TABS: 150.0000 mg | ORAL_TABLET | Freq: Every morning | ORAL | 5 refills | Status: DC

## 2019-06-16 NOTE — Patient Instructions (Signed)
I ordered a carpal tunnel NCV- and also a HST ( Home Sleep Test)to check of you have nocturnal palpitations -or irregular heart rates-,    I like to start Solriamfetol tablets What is this medicine? SOLRIAMFETOL (sol ri AM fe tol) is used to treat excessive sleepiness caused by certain sleep disorders including narcolepsy and obstructive sleep apnea. This medicine may be used for other purposes; ask your health care provider or pharmacist if you have questions. COMMON BRAND NAME(S): SUNOSI What should I tell my health care provider before I take this medicine? They need to know if you have any of these conditions:  bipolar disorder  diabetes  heart disease  high blood pressure  high cholesterol  history of drug abuse or alcohol abuse problem  history of stroke  kidney disease  schizophrenia  an unusual or allergic reaction to solriamfetol, other medicines, foods, dyes, or preservatives  pregnant or trying to get pregnant  breast-feeding How should I use this medicine? Take this medicine by mouth with a glass of water when you first wake up. Do not take it within 9 hours of your planned bedtime. Follow the directions on the prescription label. You can take it with or without food. If it upsets your stomach, take it with food. Take your medicine at regular intervals. Do not take it more often than directed. Do not stop taking except on your doctor's advice. A special MedGuide will be given to you by the pharmacist with each prescription and refill. Be sure to read this information carefully each time. Talk to your pediatrician regarding the use of this medicine in children. Special care may be needed. Overdosage: If you think you have taken too much of this medicine contact a poison control center or emergency room at once. NOTE: This medicine is only for you. Do not share this medicine with others. What if I miss a dose? If you miss a dose, take it as soon as you can.  However, avoid taking it within 9 hours of your planned bedtime, since you may find it harder to go to sleep. If it is almost time for your next dose, take only that dose. Do not take double or extra doses. What may interact with this medicine? Do not take this medicine with any of the following medications:  MAOIs like Carbex, Eldepryl, Marplan, Nardil, and Parnate This medicine may also interact with the following medications:  certain medicines for Parkinson's disease like levodopa, pramipexole, or ropinirole  medicines that increase blood pressure or heart rate This list may not describe all possible interactions. Give your health care provider a list of all the medicines, herbs, non-prescription drugs, or dietary supplements you use. Also tell them if you smoke, drink alcohol, or use illegal drugs. Some items may interact with your medicine. What should I watch for while using this medicine? Visit your healthcare professional for regular checks on your progress. Tell your healthcare professional if your symptoms do not start to get better or if they get worse. This medicine has a risk of abuse and dependence. Your healthcare provider will check you for this while you take this medicine. What side effects may I notice from receiving this medicine? Side effects that you should report to your doctor or health care professional as soon as possible:  allergic reactions like skin rash, itching, and hives; swelling of the face, lips, or tongue  anxiety  changes in emotions or moods  elevated mood, decreased need for sleep,  racing thoughts, impulsive behavior  fast heartbeat  hallucinations, loss of contact with reality  irritable  signs and symptoms of a dangerous increase in blood pressure like chest pain; shortness of breath; sudden severe headache; vision disturbances; seizures; decreased consciousness  signs and symptoms of a stroke like changes in vision; confusion; trouble  speaking or understanding; severe headaches; sudden numbness or weakness of the face, arm or leg; trouble walking; dizziness; loss of balance or coordination  vomiting Side effects that usually do not require medical attention (report these to your doctor or health care professional if they continue or are bothersome):  decreased appetite  dry mouth  increased sweating  nausea  trouble sleeping This list may not describe all possible side effects. Call your doctor for medical advice about side effects. You may report side effects to FDA at 1-800-FDA-1088. Where should I keep my medicine? Keep out of the reach of children. This medicine can be abused. Keep your medicine in a safe place to protect it from theft. Do not share this medicine with anyone. Selling or giving away this medicine is dangerous and is against the law. Store at room temperature between 15 and 30 degrees C (59 and 86 degrees F). This medicine may cause harm and death if it is taken by other adults, children, or pets. Return medicine that has not been used to an official disposal site. Contact the DEA at 703-080-9777 or your city/county government to find a site. If you cannot return the medicine, mix any unused medicine with a substance like cat litter or coffee grounds. Then throw the medicine away in a sealed container like a sealed bag or coffee can with a lid. Do not use the medicine after the expiration date. NOTE: This sheet is a summary. It may not cover all possible information. If you have questions about this medicine, talk to your doctor, pharmacist, or health care provider.  2020 Elsevier/Gold Standard (2018-05-29 12:40:49)

## 2019-06-16 NOTE — Progress Notes (Signed)
NEUROLOGY SLEEP CLINIC   PATIENT: Kathryn Hendricks DOB: 1956/07/11  REASON FOR VISIT: follow up- narcolepsy, mild memory impairment, ADD   PCP Dr. Gwendlyn Deutscher .  HISTORY OF PRESENT ILLNESS: Narcolepsy - EDS, ADD, possible learning disability.      Rv 06-16-2019, patient referred for a question of numbness in the left hand middle-fingers and in toes of the left foot, sometimes having cramps. Not RLS.    Her Memory test was post ponded during Covid, need to do this today. She has been EDS again, can't find RITALIN of much help- shall try Sunosi.  Marland KitchenEpworth is today 15/24 points, GDS 1/ 15 points.   HISTORY: 05-08-2018, Kathryn Hendricks is a longtime patient of mine, she is meanwhile 63 years of age and she has ongoing difficulties with excessive daytime sleepiness.  She reports that she sometimes has the irresistible urge to go to sleep just for a minute or 2 and these naps may only last a couple of minutes maximal 15 but are needed for her to face the next 2 hours of the day.  She needs these naps to recharge and short intervals.  Unfortunately this interferes with her social life and she has been found falling asleep in front of the grocery store when the rest of her family expected her to come in.  She has been treated with Adderall which has helped somewhat with sleepiness but has not had the long-lasting effect.  She has now developed trouble to sleep at night however- and we need to revisit the stimulant medication. Her daughter is very concerned. Epworth Sleepiness score endorsed at 20-24 points, FSS at 57/63 points, she reports not feeling depressed, not having new medical problems or medication changes.   11-05-2017.I have the pleasure to meet today with Kathryn Hendricks on 05 November 2017.  Kathryn Hendricks is a 63 year old African-American right-handed female with a diagnosis of narcolepsy, possibly cataplexy, and a concern of mild memory impairment.  We suspect that the patient had ADD which affected  her ability to perform well in school and she may have a learning disability in addition.  She is treated currently with Adderall as a stimulant to keep her from falling asleep but this is not always working or not working long enough to allow her to participate in a full day's work.  It also leaves her still with a very high degree of daytime sleepiness, unsafe to operate machinery or drive.  Today's Epworth score was endorsed at 20 points out of 24 possible points, she has not yet taken medication for this day.  Her fatigue severity was endorsed at 52 points/ 63 possible points. 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  She also mentioned that she had just a 7th grandchild , 3 month ago.   Today 03/05/17, [name] is a 63 year old female with a history of narcolepsy and mild memory impairment in ADD. She returns today for follow-up. She reports that she continues to take Adderall 10 mg daily. She does feel that it may not be as effective as it was when she initially started the medication. She says later in the day she is very sleepy. She reports that she does recently had a hysterectomy. She states since then she has noticed increased mood swings. However she does report that she was having some mood swings before the surgery. She does note that she is very forgetful. She  currently lives at home alone. She is able to complete all ADLs. She does operate a motor vehicle and occasionally will get lost but is able to correct this. She returns today for an evaluation  HISTORY 06-19-2016 . Kathryn Hendricks is a 63 year old  Afro-american single female with a history of narcolepsy, mild memory impairment and ADD.  There is a suspicion of a life long learning disability . She lost her sister in late October 2016. She is currently taking Adderall 10 mg daily- works fairly well. 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 She states that usually by the  end of her shift she is exhausted. She states that sometimes when she goes home if she tries to nap she will sleep for hours. She is able to complete all ADLs independently. She will undergo a hysterectomy within the next couple of months, she recently saw her cardiologist and was told that her cardiac function is fine, according to her. Fatigue severity is still very high for this patient, a 59 points, the Epworth sleepiness score is still endorsed at 18 points, this makes driving unsafe for her.   04/27/15 (Sigurd Pugh): Kathryn Hendricks is a 63 y.o. female patient seen here as are visitfrom Dr. Gwendlyn Deutscher .The patient was last seen on 01-19-15 .Kathryn Hendricks is a 63 y.o. female seen here as a referral from Dr. Gwendlyn Deutscher for treatment of recently diagnosed Narcolepsy and evaluation of memory concerns. Kathryn Hendricks reports that she had increasing difficulties with sleepiness and felt increasingly fatigued, she was not aware that she had physically exerted herself. Her primary care physician at the time was [name] and he referred her for a sleep study in 2014. She underwent PSG testing on 06-09-13 at the [facility] laboratory, resulting in a very normal PSG , but with a sleep latency of 3 minutes , a REM latency  of 67.5 minutes.  Her sleep efficiency was 85.3% and her total sleep time 331 minutes. There were no significant apneas or hypopneas noted, and her AHI was 3.8. There were no periodic limb movements noted.  Study was deemed valid for an MSLT to follow.  The MSLT documented 5 naps with a mean sleep latency of 0.6 minutes and two naps with sleep REM onsets.  This test is diagnostic for the condition of narcolepsy , the MSLT took place on 08-05-13.   Kathryn Hendricks has endorsed again an Epworth Sleepiness Scale of 24 points at the fatigue severity score of 62 out of a possible 63, points. She had stopped modafinil in January after she felt it had become ineffective and told this to Dr.  Halford Chessman, her treating sleep physician, only after the fact.  She reported falling asleep and feeling tired all the time again she has been is especially being concerned because she is falling asleep when driving. She has tried not to drive alone. She was advised that she is not safe to drive or operate machinery !! The patient endorsed no cataplectic attacks. She also stated that the Provigil made her feel short of breath. Just yesterday she fell asleep at the dinner table when her sister was her Guest of on her. She was very embarrassed. She goes to bed between 7 and 8 PM and usually is asleep promptly. She will sleep through until 5-5:30 AM.  She will go once to the bathroom at night on average. He may have throat little periods of waking up but usually is able to fall asleep right away again.  Her sleep does not feel restorative to her. She has lots of vivid dreams and sometimes dreams of being chased or by the nightmarish content. She has noticed more trouble with her memory but may be fatigue related but could be an independent finding. She feels sometimes clumsy and unsteady when walking. She is upsetting her family since she begun to repeat herself frequently and has been displacing things. She has more trouble with spelling. I would like to add that I was able to review her sleep studies here in person and an echo- cardiogram performed on 06-16-13 with a normal ejection fraction of 65% at grade 1 diastolic dysfunction and mild L a dilated patient. They have been previously no focal neurologic deficits noted.   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 She frequenlty fell asleep in school. She was napping daily after school. Life long complaint of excessive daytimesleepiness.She had a sleep study at the [facility] and  Dr . Halford Chessman has followed her.  Interval  history from 8-31-2016Chief complaint according to patient : Kathryn Hendricks is here today to discuss the results of an MRI study obtained earlier this summer. She received a call that there were no acute findings but she would like to know what exactly the findings mean. She's also endorsing still a very high degree of daytime sleepiness, 20-24 points . Within our session today we obtained a Mini-Mental Status Examination .  Kathryn Hendricks's MRI of the brain from 01-27-15 documented an underdeveloped splenium of the corpus callosum, a thin lipoma and periventricular FLAIR hyperintensities which we present most likely developmental changes, and no acute findings were seen. This MRI could not be compared to previous MRIs as none were available. Kathryn Hendricks's question if her learning disabilities could be related to an abnormality of the splenium have to be answered as a cautious possibility, this would not explain her hypersomnia and a discussed with her that her excessive daytime sleepiness is not related to the changes in the brain as noted. Her cognitive difficulties have been with her all her life, and a Mini-Mental Status Examination today to exclude the attention and calculation part. The patient scored 25 out of 30 points. This has been a stable finding she was also able to copy an image, she was able to draw a clock face and she named 14 animals in a fluency test.  Sleep habits are as follows: She reports falling asleep in public places, for example in a restaurant in the waiting room etc. she's not able safely to operate machinery she's not able to safely drive because of her excessive daytime sleepiness. She has been prescribed modafinil as well as a stimulants which have given her a little bit more energy but not having a lasting effect. I would be hesitant to consider this patient a good candidate for Xyrem.  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 The patient works 8 to 16.30 .  06-19-2016 . Kathryn Hendricks is a 63 year old  Afro-american single female with a history of narcolepsy, mild memory impairment and ADD.  There is a suspicion of a life long learning  disability . She lost her sister in late October 2016. She is currently taking Adderall 10 mg daily- works fairly well. 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  She states that usually by the end of her shift she is exhausted. She states that sometimes when she goes home if she tries to nap she will sleep for hours. She is able to complete all ADLs independently. She will undergo a hysterectomy within the next couple of months, she recently saw her cardiologist and was told that her cardiac function is fine, according to her. Fatigue severity is still very high for this patient, a 59 points, the Epworth sleepiness score is still endorsed at 18 points, this makes driving unsafe for her.  She inquires about FMLA for her job.   REVIEW OF SYSTEMS: Out of a complete 14 system review of symptoms, the patient complains only of the following symptoms, and all other reviewed systems are negative.  Easily distracted,  Hypersomnia is persistent.   How likely are you to doze in the following situations: 0 = not likely, 1 = slight chance, 2 = moderate chance, 3 = high chance  Sitting and Reading? Watching Television? Sitting inactive in a public place (theater or meeting)? Lying down in the afternoon when circumstances permit? Sitting and talking to someone? Sitting quietly after lunch without alcohol? In a car, while stopped for a few minutes in traffic? As a passenger in a car for an hour without a break?  Total = 15/ 24     ALLERGIES: Allergies  Allergen Reactions  . Penicillins Shortness Of Breath, Itching and Swelling    Has patient had a PCN reaction causing immediate rash, facial/tongue/throat swelling, SOB or lightheadedness with hypotension:  Yes Has patient had a PCN reaction causing severe rash involving mucus membranes or skin necrosis: No Has patient had a PCN reaction that required hospitalization: No Has patient had a PCN reaction occurring within the last 10 years: No If all of the above answers are "NO", then may proceed with Cephalosporin use.     HOME MEDICATIONS: Outpatient Medications Prior to Visit  Medication Sig Dispense Refill  . albuterol (PROVENTIL HFA;VENTOLIN HFA) 108 (90 Base) MCG/ACT inhaler Inhale 2 puffs into the lungs 4 (four) times daily. 1 Inhaler 4  . amphetamine-dextroamphetamine (ADDERALL) 10 MG tablet TAKE 1 TABLET BY MOUTH WITH WATER BEFORE BREAKFAST AND BEFORE LUNCH 60 tablet 0  . aspirin 81 MG EC tablet Take 1 tablet (81 mg total) by mouth daily. 90 tablet 1  . ibuprofen (ADVIL,MOTRIN) 200 MG tablet Take 400 mg by mouth every 6 (six) hours as needed for headache or moderate pain.    Marland Kitchen lisinopril (PRINIVIL,ZESTRIL) 5 MG tablet TAKE 1 TABLET BY MOUTH ONCE DAILY 90 tablet 3  . modafinil (PROVIGIL) 200 MG tablet Take 1 tablet (200 mg total) by mouth daily. 30 tablet 5  . simvastatin (ZOCOR) 40 MG tablet Take 1 tablet (40 mg total) by mouth at bedtime. 90 tablet 2   No facility-administered medications prior to visit.     PAST MEDICAL HISTORY: Past Medical History:  Diagnosis Date  . Abdominal pain, left lower quadrant 11/08/2015  . Abnormal Pap smear 2011  . Anxiety   . Arthritis    lower back and shoulders  . Bronchitis    hx - used abluterol inhaler  . Chronic diastolic heart failure ([place])   . Cognitive decline   . FIBROIDS, UTERUS 07/02/2009   Qualifier: Diagnosis of  By: Ta MD, Cat    . GERD (gastroesophageal reflux disease)    occasional  . Hyperlipidemia   . Hypertension   . Narcolepsy   .  Osteopenia 04/2019   T score -1.7 FRAX 3.4% / 0.2%  . Premature atrial contractions   . PVC's (premature ventricular contractions)   . Seasonal allergies   . SVD (spontaneous vaginal  delivery)    x 3    PAST SURGICAL HISTORY: Past Surgical History:  Procedure Laterality Date  . ANTERIOR AND POSTERIOR REPAIR N/A 02/19/2017   Procedure: ANTERIOR (CYSTOCELE);  Surgeon: Bjorn Loser, MD;  Location: [facility];  Service: Urology;  Laterality: N/A;  . APPENDECTOMY    . COLONOSCOPY    . CYSTOSCOPY N/A 02/19/2017   Procedure: CYSTOSCOPY;  Surgeon: Bjorn Loser, MD;  Location: [facility];  Service: Urology;  Laterality: N/A;  . DILATION AND CURETTAGE OF UTERUS  2010  . HYSTEROSCOPY W/ ENDOMETRIAL ABLATION  2010  . SALPINGOOPHORECTOMY Bilateral 02/19/2017   Procedure: SALPINGO OOPHORECTOMY;  Surgeon: Anastasio Auerbach, MD;  Location: [facility];  Service: Gynecology;  Laterality: Bilateral;  . TUBAL LIGATION    . VAGINAL HYSTERECTOMY N/A 02/19/2017   Procedure: HYSTERECTOMY VAGINAL ,  BSO.;  Surgeon: Anastasio Auerbach, MD;  Location: [facility];  Service: Gynecology;  Laterality: N/A;  DO NOT OPEN LAP INSTRUMENTS  . WISDOM TOOTH EXTRACTION      FAMILY HISTORY: Family History  Problem Relation Age of Onset  . Hypertension Mother   . Diabetes Mother   . Stroke Mother   . CAD Mother 29  . Narcolepsy Mother        not diagnosed  . Hypertension Sister   . Cancer Sister        Colon cancer  . Hypertension Brother   . Cancer Brother        throat  . Diabetes Father   . Heart disease Father     SOCIAL HISTORY: Social History   Socioeconomic History  . Marital status: Divorced    Spouse name: Not on file  . Number of children: 3  . Years of education: 6  . Highest education level: Not on file  Occupational History  . Occupation: Microbiologist: [employer]  . Financial resource strain: Not on file  . Food insecurity    Worry: Not on file    Inability: Not on file  . Transportation needs    Medical: Not on file    Non-medical: Not on file  Tobacco Use  . Smoking status: Never Smoker  . Smokeless tobacco: Never Used  Substance and  Sexual Activity  . Alcohol use: No    Alcohol/week: 0.0 standard drinks  . Drug use: No  . Sexual activity: Never    Birth control/protection: Post-menopausal, Surgical    Comment: 1st intercourse 46 yo-5 partners  Lifestyle  . Physical activity    Days per week: Not on file    Minutes per session: Not on file  . Stress: Not on file  Relationships  . Social Herbalist on phone: Not on file    Gets together: Not on file    Attends religious service: Not on file    Active member of club or organization: Not on file    Attends meetings of clubs or organizations: Not on file    Relationship status: Not on file  . Intimate partner violence    Fear of current or ex partner: Not on file    Emotionally abused: Not on file    Physically abused: Not on file    Forced sexual activity: Not on file  Other Topics Concern  . Not on file  Social History Narrative   Caffeine 1 cup coffee in am.      PHYSICAL EXAM  Vitals:   06/16/19 1354  BP: 134/80  Temp: 97.9 F (36.6 C)  Weight: 156 lb (70.8 kg)  Height: 5\' 5"  (1.651 m)   Body mass index is 25.96 kg/m.  MMSE - Mini Mental State Exam 03/05/2017 06/19/2016 11/03/2015  Orientation to time 4 4 5   Orientation to Place 5 5 5   Registration 3 3 3   Attention/ Calculation 0 2 4  Recall 1 2 2   Language- name 2 objects 2 2 2   Language- repeat 0 0 0  Language- follow 3 step command 3 3 2   Language- read & follow direction 1 1 1   Write a sentence 0 0 1  Copy design 1 0 0  Total score 20 22 25     Finished High school , had special education classes.  Writing and calculation ability is poor.   General: The patient is awake, alert and appears not in acute distress. The patient is well groomed. Head: Normocephalic, atraumatic. Neck is supple. Mallampati 3, neck circumference:13 inches  Cardiovascular:  Regular rate and rhythm , without  murmurs or carotid bruit, and without distended neck veins. Respiratory: Lungs are clear to  auscultation.Skin:  Without evidence of edema, or rash  Neurologic exam : The patient is awake and alert, oriented to place and time. Memory subjective described as impaired- There is a reduced attention span & concentration ability. Speech is fluent with mild  dysarthria, dysphonia or aphasia. Mood and affect are appropriate.  Cranial nerves: Pupils are equal and briskly reactive to light. Extraocular movements  in vertical and horizontal planes intact and without nystagmus. Visual fields by finger perimetry are intact.Hearing to finger rub intact.  Facial sensation intact to fine touch. Facial motor strength is symmetric and tongue and uvula move midline.  Motor exam: Normal tone and normal muscle bulk - there is loss of grip strength and thenar eminence is atrophic.  Sensory:  Fine touch, pinprick and vibration were normal to ankle and wrist. . Coordination:  Finger-to-nose maneuver without evidence of ataxia or tremor. Gait and station: Patient walks without assistive device. Strength within normal limits. Stance is stable and normal- Steps are unfragmented. Romberg testing is normal. Deep tendon reflexes: in the  upper and lower extremities are symmetric, 2 plus. No clonus.     DIAGNOSTIC DATA (LABS, IMAGING, TESTING) - I reviewed patient records, labs, notes, testing and imaging myself where available. Labs from 2018 were reviewed.  New family history - a cousin was diagnosed with narcolepsy after sleep related  MVA - her grandson is diagnosed with ADD. 2 grandchildren are very sleepy.   ASSESSMENT AND PLAN:  1.Narcolepsy poorly was  controlled on Adderall, one a day 10 mg. No HTN resulting. persistent hypersomnia in narcolepsy.  She is easier irritated and wondered if this Adderall. ( likely, it is ) - she falls still asleep, needs to arrange for naps.  Cataplexy described as "dropping objects" when startled.  Sunosi ?   2. New onset numbness in finger of the left hand and left toes-  but weak grip. Will order carpal tunnel test. She noted numbness after ironing clothes.   3.Atrial fibrillation and adderall are not a good mix, but she is unable to function on ritalin- we may need to try Sunosi.    4.Memory impairment, subjective - MMSE, she has dyslexia, ADD, adult  form - benefited in this regard  from Adderall.   I will schedule her again for MMSE with NP next Spring. Started Sunosi and ordered NCV and EMG.     Larey Seat, MD  06/16/2019, 2:15 PM [facility] [address], [place], [place] [address] [phone]

## 2019-06-16 NOTE — Telephone Encounter (Signed)
I sent patient a certified letter that contains Dr. Dorette Grate My Chart note to her in it regarding her BD result.

## 2019-06-17 ENCOUNTER — Telehealth: Payer: Self-pay | Admitting: Neurology

## 2019-06-17 NOTE — Telephone Encounter (Signed)
Pt called stating that the pharmacy called her to inform her that the new medication that was called in for her is not covered by her insurance. Please advise.

## 2019-06-17 NOTE — Telephone Encounter (Signed)
PA is pending and I am most certain it will be approved. Then we can send the pharmacy a copay card for the patient. Waiting on approval form insurance.

## 2019-06-17 NOTE — Telephone Encounter (Signed)
PA submitted through cover my meds/ medimpact.  [id number] Should hear a response in 24 hours or so for the patient.

## 2019-06-18 ENCOUNTER — Encounter: Payer: Self-pay | Admitting: *Deleted

## 2019-06-18 NOTE — Telephone Encounter (Signed)
Faxed copay card for sunosi to [place]. Hopefully that can help with cost.

## 2019-06-18 NOTE — Telephone Encounter (Signed)
Sent her my chart advising her we've not gotten determination yet. We are closed on Fridays, and Kathryn Hendricks will look into it on Monday.

## 2019-06-18 NOTE — Telephone Encounter (Signed)
Pt states that her Pharmacy has called and the new medication she's to start has been denied by her insurance. Pt requesting a call back from RN

## 2019-06-19 MED FILL — SUNOSI 150 MG TABS: 150 | 30 days supply | Qty: 30 | Fill #0

## 2019-06-22 NOTE — Telephone Encounter (Signed)
Received notification from [name] that pt's sunosi Kathryn Hendricks has been approved from 06/19/2019-12/17/2019. To renew this PA when it expires the physician must attest that pt has demonstrated 25% of more improvement in ESS scores compared to baseline. PA ref # [id number].  I have already faxed a sunosi copay card to pt's pharmacy as well. I will fax this PA determination too.

## 2019-06-22 NOTE — Telephone Encounter (Signed)
I called pt to discuss. No answer, left a message asking her to call me back. 

## 2019-06-23 MED FILL — LISINOPRIL 5 MG TAB: 5 | 90 days supply | Qty: 90 | Fill #1

## 2019-06-25 ENCOUNTER — Encounter: Payer: Self-pay | Admitting: Neurology

## 2019-06-25 NOTE — Telephone Encounter (Signed)
Pt does have hypertension and high cholesterol

## 2019-06-25 NOTE — Telephone Encounter (Signed)
Pt called in and stated the Solriamfetol HCl (SUNOSI) 150 MG TABS  is causing her heart race and her bp to increase

## 2019-06-25 NOTE — Telephone Encounter (Signed)
Called the patient to make sure she was cutting the tablet in half to equal the 75 mg dose. There was no answer and VM was full. I will attempt to send a mychart message and see if she gets that message.

## 2019-06-30 ENCOUNTER — Encounter: Payer: Self-pay | Admitting: Family Medicine

## 2019-06-30 ENCOUNTER — Telehealth: Payer: Self-pay

## 2019-06-30 ENCOUNTER — Other Ambulatory Visit: Payer: Self-pay

## 2019-06-30 ENCOUNTER — Ambulatory Visit (INDEPENDENT_AMBULATORY_CARE_PROVIDER_SITE_OTHER): Payer: 59 | Admitting: Family Medicine

## 2019-06-30 DIAGNOSIS — R159 Full incontinence of feces: Secondary | ICD-10-CM | POA: Insufficient documentation

## 2019-06-30 DIAGNOSIS — E7800 Pure hypercholesterolemia, unspecified: Secondary | ICD-10-CM | POA: Diagnosis not present

## 2019-06-30 DIAGNOSIS — I10 Essential (primary) hypertension: Secondary | ICD-10-CM

## 2019-06-30 DIAGNOSIS — R151 Fecal smearing: Secondary | ICD-10-CM | POA: Diagnosis not present

## 2019-06-30 DIAGNOSIS — R202 Paresthesia of skin: Secondary | ICD-10-CM | POA: Insufficient documentation

## 2019-06-30 MED ORDER — ZOSTER VAC RECOMB ADJUVANTED 50 MCG/0.5ML IM SUSR: 0.5000 mL | Freq: Once | INTRAMUSCULAR | 1 refills | Status: AC

## 2019-06-30 NOTE — Progress Notes (Signed)
Subjective:     Patient ID: Kathryn Hendricks, female   DOB: Oct 06, 1955, 63 y.o.   MRN: [id number]  HPI HTN/HLD: Compliant with meds. Here for f/u. Finger numbness:C/O  middle finger numbness with exposure to cold B/L. When out in the cold or in a cold environment, her middle fingers will turn white and get numb. This improves after running warm water over her finger. She denies any pain, no itching or burning sensation. 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 Symptoms has been going on for about 3 years, but more frequent. She discussed this with her neurologist and they recommended EMG testing. GI symptoms: C/O soiling her underpants with stool ongoing for  2 year. Her symptoms is worsening. She denies abdominal pain, no constipation or diarrhea, no blood in her stool. No other GI symptoms. HM: Has not gotten shingrix. She got flu shot at her job.  Current Outpatient Medications on File Prior to Visit  Medication Sig Dispense Refill  . amphetamine-dextroamphetamine (ADDERALL) 10 MG tablet TAKE 1 TABLET BY MOUTH WITH WATER BEFORE BREAKFAST AND BEFORE LUNCH 60 tablet 0  . aspirin 81 MG EC tablet Take 1 tablet (81 mg total) by mouth daily. 90 tablet 1  . lisinopril (PRINIVIL,ZESTRIL) 5 MG tablet TAKE 1 TABLET BY MOUTH ONCE DAILY 90 tablet 3  . simvastatin (ZOCOR) 40 MG tablet Take 1 tablet (40 mg total) by mouth at bedtime. 90 tablet 2  . albuterol (PROVENTIL HFA;VENTOLIN HFA) 108 (90 Base) MCG/ACT inhaler Inhale 2 puffs into the lungs 4 (four) times daily. (Patient not taking: Reported on 06/30/2019) 1 Inhaler 4  . ibuprofen (ADVIL,MOTRIN) 200 MG tablet Take 400 mg by mouth every 6 (six) hours as needed for headache or moderate pain.    . Solriamfetol HCl (SUNOSI) 150 MG TABS Take 150 mg by mouth every morning. (Patient not taking: Reported on 06/30/2019) 30 tablet 5   No current facility-administered medications on file prior to visit.    Past  Medical History:  Diagnosis Date  . Abdominal pain, left lower quadrant 11/08/2015  . Abnormal Pap smear 2011  . Anxiety   . Arthritis    lower back and shoulders  . Bronchitis    hx - used abluterol inhaler  . Chronic diastolic heart failure ([place])   . Cognitive decline   . FIBROIDS, UTERUS 07/02/2009   Qualifier: Diagnosis of  By: Ta MD, Cat    . GERD (gastroesophageal reflux disease)    occasional  . Hyperlipidemia   . Hypertension   . Narcolepsy   . Osteopenia 04/2019   T score -1.7 FRAX 3.4% / 0.2%  . Premature atrial contractions   . PVC's (premature ventricular contractions)   . Seasonal allergies   . SVD (spontaneous vaginal delivery)    x 3     Review of Systems  Respiratory: Negative.   Cardiovascular: Negative.   Gastrointestinal: Negative for abdominal pain, anal bleeding, blood in stool, constipation, nausea and rectal pain.       Rectal incontinence  Genitourinary: Negative.   Neurological:       Numbness  All other systems reviewed and are negative.      Vitals:   06/30/19 1112  BP: 132/88  Pulse: 70  SpO2: 98%  Weight: 156 lb 6.4 oz (70.9 kg)    Objective:   Physical Exam Nursing note reviewed. Exam conducted with a chaperone present.  Cardiovascular:     Rate and  Rhythm: Normal rate and regular rhythm.     Heart sounds: Normal heart sounds. No murmur.  Pulmonary:     Effort: Pulmonary effort is normal. No respiratory distress.     Breath sounds: Normal breath sounds. No stridor. No wheezing or rhonchi.  Abdominal:     General: Abdomen is flat. Bowel sounds are normal. There is no distension.     Palpations: There is no mass.     Tenderness: There is no abdominal tenderness.  Musculoskeletal:     Right wrist: Normal.     Left wrist: Normal.     Right hand: Normal.     Left hand: Normal.     Right lower leg: No edema.     Left lower leg: No edema.  Neurological:     General: No focal deficit present.     Cranial Nerves: Cranial nerves  are intact.     Sensory: Sensation is intact.     Motor: Motor function is intact.     Coordination: Coordination is intact.     Deep Tendon Reflexes: Reflexes are normal and symmetric.     Comments: No numbness or sorry loss over all fingers and hands.        Assessment:     HTN HLD Rectal incontinence Hand numbness and Paresthesia    Plan:     Check problem list. Shingrix escribed. She will get it from her pharmacy.

## 2019-06-30 NOTE — Telephone Encounter (Signed)
Called in voice mail wanting to discuss results.    I called her and left message to call me.

## 2019-06-30 NOTE — Assessment & Plan Note (Signed)
Stable on Zocor 40 mg qd. Recheck FLP in 2021.

## 2019-06-30 NOTE — Assessment & Plan Note (Signed)
Element of CTS and Raynaud's Phenomenon. Cold avoidance discussed. We will consider further workup and treatment if there is no improvement with conservative measures. F/U with neuro for NSC/EMG as scheduled.

## 2019-06-30 NOTE — Patient Instructions (Signed)
It was nice seeing you today. What you described to me sounds like Raynaud's phenomenon which occurs with exposure to cold. Please wear protective gloves most of the time when in a cold environment. We will consider further evaluation and treatment in the future if there is no improvement. I will place referral to the gastroenterologist for your stomach symptoms. Call if you have any questions.

## 2019-06-30 NOTE — Assessment & Plan Note (Addendum)
??   Rectal sphincter incontinence. GI referral placed. Will consider surgical referral after GI eval.

## 2019-06-30 NOTE — Assessment & Plan Note (Signed)
BP looks good on Lisinopril 5 mg qd. May D/C baby ASA for primary prevention. F/U in 6 months.

## 2019-07-01 ENCOUNTER — Telehealth: Payer: Self-pay

## 2019-07-01 ENCOUNTER — Ambulatory Visit (INDEPENDENT_AMBULATORY_CARE_PROVIDER_SITE_OTHER): Payer: 59 | Admitting: Neurology

## 2019-07-01 DIAGNOSIS — I10 Essential (primary) hypertension: Secondary | ICD-10-CM

## 2019-07-01 DIAGNOSIS — G3184 Mild cognitive impairment, so stated: Secondary | ICD-10-CM

## 2019-07-01 DIAGNOSIS — R5382 Chronic fatigue, unspecified: Secondary | ICD-10-CM

## 2019-07-01 DIAGNOSIS — G8929 Other chronic pain: Secondary | ICD-10-CM

## 2019-07-01 DIAGNOSIS — G4710 Hypersomnia, unspecified: Secondary | ICD-10-CM | POA: Diagnosis not present

## 2019-07-01 DIAGNOSIS — I493 Ventricular premature depolarization: Secondary | ICD-10-CM

## 2019-07-01 DIAGNOSIS — M25512 Pain in left shoulder: Secondary | ICD-10-CM

## 2019-07-01 DIAGNOSIS — I5032 Chronic diastolic (congestive) heart failure: Secondary | ICD-10-CM

## 2019-07-01 DIAGNOSIS — G47411 Narcolepsy with cataplexy: Secondary | ICD-10-CM

## 2019-07-01 DIAGNOSIS — I739 Peripheral vascular disease, unspecified: Secondary | ICD-10-CM

## 2019-07-01 DIAGNOSIS — R200 Anesthesia of skin: Secondary | ICD-10-CM

## 2019-07-01 NOTE — Telephone Encounter (Signed)
Received this message from the sleep lab where the patient had called them.   "Pt having complications with new sleep medicine Dr. Brett Fairy has prescribed at last visit. She has had to stop taking it. Pt needs return call back"  I called the patient back and she is at work. I asked her if she took the pill in half and she did. The patient stated it would cause her BP to go up and she was unable to tolerate.The patient had to end the call because she was at work and she states that she will call back.

## 2019-07-01 NOTE — Telephone Encounter (Signed)
Pt having complications with new sleep medicine Dr. Brett Fairy has prescribed at last visit. She has had to stop taking it. Pt needs return call back.

## 2019-07-07 MED FILL — SHINGRIX 50 MCG SUS: 50 | 1 days supply | Qty: 1 | Fill #0

## 2019-07-08 ENCOUNTER — Telehealth: Payer: Self-pay | Admitting: Family Medicine

## 2019-07-08 NOTE — Telephone Encounter (Signed)
HIPAA compliant callback message left.   Patient reported getting flu shot at her job during her last visit. However, it was not updated on file.  Please obtain vaccine information and update on file for her. Thank you.

## 2019-07-08 NOTE — Telephone Encounter (Signed)
Patient calls back and stated she received her flu vaccine in the middle of the month, 10/15. I updated her chart.

## 2019-07-09 ENCOUNTER — Encounter (INDEPENDENT_AMBULATORY_CARE_PROVIDER_SITE_OTHER): Payer: 59 | Admitting: Diagnostic Neuroimaging

## 2019-07-09 ENCOUNTER — Other Ambulatory Visit: Payer: Self-pay

## 2019-07-09 ENCOUNTER — Ambulatory Visit: Payer: 59 | Admitting: Diagnostic Neuroimaging

## 2019-07-09 DIAGNOSIS — R200 Anesthesia of skin: Secondary | ICD-10-CM

## 2019-07-09 DIAGNOSIS — G4710 Hypersomnia, unspecified: Secondary | ICD-10-CM

## 2019-07-09 DIAGNOSIS — Z0289 Encounter for other administrative examinations: Secondary | ICD-10-CM

## 2019-07-09 DIAGNOSIS — I739 Peripheral vascular disease, unspecified: Secondary | ICD-10-CM

## 2019-07-09 DIAGNOSIS — G8929 Other chronic pain: Secondary | ICD-10-CM

## 2019-07-09 DIAGNOSIS — R5382 Chronic fatigue, unspecified: Secondary | ICD-10-CM

## 2019-07-09 DIAGNOSIS — G47411 Narcolepsy with cataplexy: Secondary | ICD-10-CM

## 2019-07-09 DIAGNOSIS — G3184 Mild cognitive impairment, so stated: Secondary | ICD-10-CM

## 2019-07-14 ENCOUNTER — Other Ambulatory Visit: Payer: Self-pay | Admitting: Neurology

## 2019-07-14 ENCOUNTER — Telehealth: Payer: Self-pay | Admitting: Neurology

## 2019-07-14 MED ORDER — AMPHETAMINE-DEXTROAMPHETAMINE 10 MG PO TABS: ORAL_TABLET | ORAL | 0 refills | Status: DC

## 2019-07-14 MED FILL — SHINGRIX 50 MCG SUS: 50 | 1 days supply | Qty: 1 | Fill #0

## 2019-07-14 MED FILL — AMPHETAMINE-DEXTROAMPHETAMI: 10 | 30 days supply | Qty: 60 | Fill #0

## 2019-07-14 NOTE — Telephone Encounter (Signed)
Pt has called re: 2 matters.  One being she would like the results to her most recent testing and the other being she is no longer taking the Solriamfetol HCl (SUNOSI) 150 MG TABS.  Pt states it raised her blood pressure and made her light headed.  Pt asking to go back on her previous medication.  Please call

## 2019-07-14 NOTE — Addendum Note (Signed)
Addended by: Larey Seat on: 07/14/2019 12:41 PM   Modules accepted: Orders

## 2019-07-14 NOTE — Procedures (Signed)
Patient Information     First Name: Kathryn Hendricks Last Name: Roses ID: [id number]  Birth Date: March 11, 1956 Age: 63 Gender: Female  Referring Provider: Kinnie Feil, MD BMI: 26.1 (W=156 lb, H=5' 5'')  Neck Circ.:  13 '' Epworth:  15/24   Sleep Study Information    Study Date: Jul 01, 2019 S/H/A Version: 001.001.001.001 / 4.1.1528 / 73  History:    Kathryn Hendricks is 63 years of age and she has ongoing difficulties with excessive daytime sleepiness.  She reports that she sometimes has the irresistible urge to go to sleep just for a minute or 2 and these naps may only last a couple of minutes maximal 15 but are needed for her to face the next 2 hours of the day.  She needs these naps to recharge for short intervals.  Unfortunately this interferes with her social life and she has been found falling asleep in front of the grocery store when the rest of her family expected her to come in.  She has been treated with Adderall which has helped somewhat with sleepiness but has not had the long-lasting effect.  She has now developed trouble to sleep at night however- and we need to revisit the stimulant medication. Her daughter is very concerned. Epworth Sleepiness score endorsed at 20-24 points, FSS at 57/63 points.  She reports not feeling depressed, not having new medical problems or medication changes.    Summary & Diagnosis:    There is very mild sleep apnea noted at 7.3/h overall AHI, her REM AHI is 19.4/h. Sleep appears fragmented with few and late onset REM sleep. Snoring is present, but oxygen saturation remains stable throughout the night.     Recommendations:     Eve mild apnea may contribute to the severe hypersomnia as reported.  I would like for the patient to either start with an Auto-titrator CPAP, 5-12 cm water 1 cm EPR .A dental device which helps with snoring and mild apnea is less helpful in REM accentuated apnea. Also, encourage patient to avoid left lateral sleep ( see elevated AHI in that  position) . RV after 90 days with NP. Interpreting Physician: Larey Seat, MD            Sleep Summary  Oxygen Saturation Statistics   Start Study Time: End Study Time: Total Recording Time:  7:34:48 PM 5:38:49 AM 10 h, 4 min  Total Sleep Time % REM of Sleep Time:  7 h, 26 min  14.0    Mean: 96 Minimum: 87 Maximum: 99  Mean of Desaturations Nadirs (%):   93  Oxygen Desaturation. %: 4-9 10-20 >20 Total  Events Number Total  11 100.0  0 0.0  0 0.0  11 100.0  Oxygen Saturation: <90 <=88 <85 <80 <70  Duration (minutes): Sleep % 0.0 0.0 0.0 0.0 0.0 0.0 0.0 0.0 0.0 0.0     Respiratory Indices      Total Events REM NREM All Night  pRDI:  75  pAHI:  54 ODI:  11  pAHIc: 0  % CSR: 0.0 22.4 19.4 2.9 0.0 8.2 5.3 1.3 0.0 10.1 7.3 1.5 0.0       Pulse Rate Statistics during Sleep (BPM)      Mean: 60 Minimum: 47 Maximum: 85    Indices are calculated using technically valid sleep time of  7 hrs, 24 min. Central-Indices are calculated using technically valid sleep time of  6  hrs, 32 min. pRDI/pAHI are calculated using oxi  desaturations ? 3%  Body Position Statistics  Position Supine Prone Right Left Non-Supine  Sleep (min) 265.5 0.0 96.5 83.5 180.0  Sleep % 59.5 0.0 21.6 18.7 40.3  pRDI 10.0 N/A 6.2 15.1 10.4  pAHI 6.4 N/A 4.4 13.7 8.7  ODI 1.1 N/A 0.6 3.6 2.0     Snoring Statistics Snoring Level (dB) >40 >50 >60 >70 >80 >Threshold (45)  Sleep (min) 50.9 3.0 0.8 0.0 0.0 21.8  Sleep % 11.4 0.7 0.2 0.0 0.0 4.9    Mean: 41 dB Sleep Stages Chart   Patient Information     First Name: Kathryn Last Name: Hendricks ID: [id number]  Birth Date: [birth year] Age: 46 Gender: Female  Referring Provider: Kinnie Feil, MD BMI: 26.1 (W=156 lb, H=5' 5'')  Neck Circ.:  13 '' Epworth:  15/24   Sleep Study Information    Study Date: Jul 01, 2019 S/H/A Version: 001.001.001.001 / 4.1.1528 / 77  History:    Kathryn Hendricks is 63 years of age and she has ongoing  difficulties with excessive daytime sleepiness.  She reports that she sometimes has the irresistible urge to go to sleep just for a minute or 2 and these naps may only last a couple of minutes maximal 15 but are needed for her to face the next 2 hours of the day.  She needs these naps to recharge for short intervals.  Unfortunately this interferes with her social life and she has been found falling asleep in front of the grocery store when the rest of her family expected her to come in.  She has been treated with Adderall which has helped somewhat with sleepiness but has not had the long-lasting effect.  She has now developed trouble to sleep at night however- and we need to revisit the stimulant medication. Her daughter is very concerned. Epworth Sleepiness score endorsed at 20-24 points, FSS at 57/63 points.  She reports not feeling depressed, not having new medical problems or medication changes.    Summary & Diagnosis:    There is very mild sleep apnea noted at 7.3/h overall AHI, her REM AHI is 19.4/h. Sleep appears fragmented with few and late onset REM sleep. Snoring is present, but oxygen saturation remains stable throughout the night.     Recommendations:     Eve mild apnea may contribute to the severe hypersomnia as reported.  I would like for the patient to either start with an Auto-titrator CPAP, 5-12 cm water 1 cm EPR .A dental device which helps with snoring and mild apnea is less helpful in REM accentuated apnea. Also, encourage patient to avoid left lateral sleep ( see elevated AHI in that position) . RV after 90 days with NP. Interpreting Physician: Larey Seat, MD            Sleep Summary  Oxygen Saturation Statistics   Start Study Time: End Study Time: Total Recording Time:  7:34:48 PM 5:38:49 AM 10 h, 4 min  Total Sleep Time % REM of Sleep Time:  7 h, 26 min  14.0    Mean: 96 Minimum: 87 Maximum: 99  Mean of Desaturations Nadirs (%):   93  Oxygen  Desaturation. %: 4-9 10-20 >20 Total  Events Number Total  11 100.0  0 0.0  0 0.0  11 100.0  Oxygen Saturation: <90 <=88 <85 <80 <70  Duration (minutes): Sleep % 0.0 0.0 0.0 0.0 0.0 0.0 0.0 0.0 0.0 0.0     Respiratory Indices  Total Events REM NREM All Night  pRDI:  75  pAHI:  54 ODI:  11  pAHIc: 0  % CSR: 0.0 22.4 19.4 2.9 0.0 8.2 5.3 1.3 0.0 10.1 7.3 1.5 0.0       Pulse Rate Statistics during Sleep (BPM)      Mean: 60 Minimum: 47 Maximum: 85    Indices are calculated using technically valid sleep time of  7 hrs, 24 min. Central-Indices are calculated using technically valid sleep time of  6  hrs, 32 min. pRDI/pAHI are calculated using oxi desaturations ? 3%  Body Position Statistics  Position Supine Prone Right Left Non-Supine  Sleep (min) 265.5 0.0 96.5 83.5 180.0  Sleep % 59.5 0.0 21.6 18.7 40.3  pRDI 10.0 N/A 6.2 15.1 10.4  pAHI 6.4 N/A 4.4 13.7 8.7  ODI 1.1 N/A 0.6 3.6 2.0     Snoring Statistics Snoring Level (dB) >40 >50 >60 >70 >80 >Threshold (45)  Sleep (min) 50.9 3.0 0.8 0.0 0.0 21.8  Sleep % 11.4 0.7 0.2 0.0 0.0 4.9    Mean: 41 dB Sleep Stages Chart

## 2019-07-14 NOTE — Telephone Encounter (Signed)
Called the patient back and she states that she was unable to tolerate the sunosi medication and felt that it just caused her BP to increase and felt dizzy. Pt is ok with just returning to what she was on before because it helped some and she didn't have the side effects. Pt also was asking about the results from the NCV/EMG. Advised the patient I didn't have those results as of this moment but will contact her once I do. Pt verbalized understanding. Pt had no questions at this time but was encouraged to call back if questions arise.

## 2019-07-15 ENCOUNTER — Telehealth: Payer: Self-pay | Admitting: Neurology

## 2019-07-15 NOTE — Telephone Encounter (Signed)
-----   Message from Larey Seat, MD sent at 07/14/2019 12:41 PM EST ----- Summary & Diagnosis:   There is very mild sleep apnea noted at 7.3/h overall AHI, her  REM AHI is 19.4/h.  Sleep appears fragmented with few and late onset REM sleep.  Snoring is present, but oxygen saturation remains stable  throughout the night.     Recommendations:    Even mild apnea may contribute to the severe hypersomnia as  reported.  I would like for the patient to either start with an  Auto-titrator CPAP, 5-12 cm water 1 cm EPR .A dental device which  helps with snoring and mild apnea is less helpful in REM  accentuated apnea. Also, encourage patient to avoid left lateral  sleep ( see elevated AHI in that position) . RV after 90 days  with NP.

## 2019-07-15 NOTE — Telephone Encounter (Signed)
Called patient to discuss sleep study results. No answer at this time. LVM for the patient to call back.   

## 2019-07-16 NOTE — Procedures (Signed)
[facility]  NCS (NERVE CONDUCTION STUDY) WITH EMG (ELECTROMYOGRAPHY) REPORT   STUDY DATE: 07/09/19 PATIENT NAME: Kathryn Hendricks DOB: Jan 21, 1956 MRN: [id number]  ORDERING CLINICIAN: Larey Seat, MD   TECHNOLOGIST: Sherre Scarlet ELECTROMYOGRAPHER: Earlean Polka. Penumalli, MD  CLINICAL INFORMATION: 63 year old female with hand numbness.  FINDINGS: NERVE CONDUCTION STUDY:  Right median and left ulnar motor responses are normal.  Left median motor response has prolonged distal latency, normal amplitude, normal conduction velocity.  Bilateral median sensory responses are prolonged peak latencies and normal amplitudes.  Left ulnar sensory response is normal.   NEEDLE ELECTROMYOGRAPHY:  Needle examination of left upper extremity deltoid, biceps, triceps, flexor carpi radialis, first dorsal interosseous is normal.   IMPRESSION:   Abnormal study demonstrating: - Mild bilateral median neuropathies at the wrist consistent with mild bilateral carpal tunnel syndrome; this is slightly more affected on the left side.   INTERPRETING PHYSICIAN:  Penni Bombard, MD Certified in Neurology, Neurophysiology and Neuroimaging  [facility] [address] [place], [place] [address] [phone]   [facility]    Nerve / Sites Muscle Latency Ref. Amplitude Ref. Rel Amp Segments Distance Velocity Ref. Area    ms ms mV mV %  cm m/s m/s mVms  L Median - APB     Wrist APB 5.4 ?4.4 5.6 ?4.0 100 Wrist - APB 7   17.8     Upper arm APB 10.1  5.3  94.9 Upper arm - Wrist 24 51 ?49 18.3  R Median - APB     Wrist APB 4.2 ?4.4 6.4 ?4.0 100 Wrist - APB 7   20.6     Upper arm APB 8.6  6.0  92.9 Upper arm - Wrist 23 51 ?49 20.2  L Ulnar - ADM     Wrist ADM 2.9 ?3.3 11.3 ?6.0 100 Wrist - ADM 7   31.8     B.Elbow ADM 6.6  11.5  101 B.Elbow - Wrist 20 55 ?49 36.1     A.Elbow ADM 8.4  11.1  96.8 A.Elbow - B.Elbow 10 55 ?49 35.4         A.Elbow - Wrist            SNC    Nerve / Sites Rec. Site Peak Lat Ref.  Amp Ref. Segments Distance    ms ms V V  cm  L Median - Orthodromic (Dig II, Mid palm)     Dig II Wrist 3.9 ?3.4 23 ?10 Dig II - Wrist 13  R Median - Orthodromic (Dig II, Mid palm)     Dig II Wrist 3.8 ?3.4 13 ?10 Dig II - Wrist 13  L Ulnar - Orthodromic, (Dig V, Mid palm)     Dig V Wrist 2.9 ?3.1 11 ?5 Dig V - Wrist 31           F  Wave    Nerve F Lat Ref.   ms ms  L Ulnar - ADM 29.3 ?32.0       EMG full       EMG Summary Table    Spontaneous MUAP Recruitment  Muscle IA Fib PSW Fasc Other Amp Dur. Poly Pattern  L. Deltoid Normal None None None _______ Normal Normal Normal Normal  L. Biceps brachii Normal None None None _______ Normal Normal Normal Normal  L. Triceps brachii Normal None None None _______ Normal Normal Normal Normal  L. Flexor carpi radialis Normal None None None _______ Normal  Normal Normal Normal  L. First dorsal interosseous Normal None None None _______ Normal Normal Normal Normal

## 2019-07-20 ENCOUNTER — Encounter: Payer: Self-pay | Admitting: Neurology

## 2019-07-20 NOTE — Telephone Encounter (Signed)
Pt returned call and I was able to review with her the nerve conduction studies  "Abnormal study demonstrating:  - Mild bilateral median neuropathies at the wrist consistent with  mild bilateral carpal tunnel syndrome; this is slightly more  affected on the left side."  Advised the patient Dr Brett Fairy would recommend the patient to use bilateral wrist splints to help with treatment of this.    I advised pt that Dr. Brett Fairy reviewed their sleep study results and found that pt has sleep apnea. Dr. Brett Fairy recommends that pt starts auto CPAP. I reviewed PAP compliance expectations with the pt. Pt is agreeable to starting a CPAP. I advised pt that an order will be sent to a DME, [facility], and [facility] will call the pt within about one week after they file with the pt's insurance. [facility] will show the pt how to use the machine, fit for masks, and troubleshoot the CPAP if needed. A follow up appt will need to made for insurance purposes with NP or MD with in 31-90 days of starting the machine. Pt will call back to schedule this because she is unsure of schedule. Pt verbalized understanding of results. Pt had no questions at this time but was encouraged to call back if questions arise. I have sent the order to [facility] and have received confirmation that they have received the order.

## 2019-07-20 NOTE — Telephone Encounter (Signed)
Called the patient for a 2nd time to review SS results as well as NCV results. There was no answer. LVM for the patient to call back.

## 2019-07-28 ENCOUNTER — Telehealth: Payer: Self-pay | Admitting: Neurology

## 2019-07-28 NOTE — Telephone Encounter (Signed)
Pt called stating that she was told to call if she has not received her cpap machine by last week. Pt states she has yet to receive it. Please advise.

## 2019-07-29 MED FILL — SIMVASTATIN 40 MG TABLET: 40 | 90 days supply | Qty: 90 | Fill #1

## 2019-07-29 NOTE — Telephone Encounter (Signed)
Called and LVM for pt. to inform pt that [facility] is who she needs to call. With last week being a holiday the order was sent on monday so it may be a few extra days before they reach out to her. I stated the she could try to reach out to them tomorrow or friday to allow them more time since they were off thur/fri also. I left the pt the number that she can reach them at [phone].

## 2019-07-31 ENCOUNTER — Telehealth: Payer: Self-pay | Admitting: Family Medicine

## 2019-07-31 NOTE — Telephone Encounter (Signed)
Clinical info completed on work form.  Place form in Dr. Macario Golds box for completion.  Jazmin Hartsell, CMA

## 2019-07-31 NOTE — Telephone Encounter (Signed)
Please advise patient that I have reviewed her form and she does not meet any of the health condition qualifications listed on the form to exempt her from working. Form in the RN box. She can return to pick it up.

## 2019-07-31 NOTE — Telephone Encounter (Signed)
Patient came into office to drop off form to be completed by PCP for her job. Patient's last apt in office was on 06-30-2019, and forms were placed in blue team folder.

## 2019-08-04 ENCOUNTER — Telehealth: Payer: Self-pay | Admitting: Neurology

## 2019-08-04 NOTE — Telephone Encounter (Signed)
Patient informed she can pick up form.

## 2019-08-04 NOTE — Telephone Encounter (Signed)
Received a notification from [facility], "Called on 11/27, 12/03, and again today 12/07. No answer LVM. I will be voiding this order, just wanted to make you aware."  Pt will have to contact [facility] to start machine at this point.

## 2019-09-21 ENCOUNTER — Other Ambulatory Visit: Payer: Self-pay

## 2019-09-21 NOTE — Telephone Encounter (Signed)
1) Medication(s) Requested (by name): amphetamine-dextroamphetamine (ADDERALL) 10 MG tablet  2) Pharmacy of Choice: [place], [place] - [address] [facility].  [id number] [place] [address]

## 2019-09-22 MED ORDER — AMPHETAMINE-DEXTROAMPHETAMINE 10 MG PO TABS: ORAL_TABLET | ORAL | 0 refills | Status: DC

## 2019-09-22 MED FILL — AMPHETAMINE-DEXTROAMPHETAMI: 10 | 30 days supply | Qty: 60 | Fill #0

## 2019-10-13 ENCOUNTER — Other Ambulatory Visit: Payer: Self-pay | Admitting: Family Medicine

## 2019-10-13 MED FILL — LISINOPRIL 5 MG TABS: 5 | 90 days supply | Qty: 90 | Fill #0

## 2019-10-13 MED FILL — SHINGRIX 50 MCG SUS: 50 | 1 days supply | Qty: 1 | Fill #1

## 2019-10-15 ENCOUNTER — Ambulatory Visit: Payer: 59 | Admitting: Neurology

## 2019-10-21 DIAGNOSIS — M25511 Pain in right shoulder: Secondary | ICD-10-CM | POA: Diagnosis not present

## 2019-10-21 DIAGNOSIS — M25512 Pain in left shoulder: Secondary | ICD-10-CM | POA: Diagnosis not present

## 2019-11-10 ENCOUNTER — Ambulatory Visit: Payer: 59 | Admitting: Neurology

## 2019-11-12 ENCOUNTER — Other Ambulatory Visit: Payer: Self-pay

## 2019-11-12 ENCOUNTER — Encounter: Payer: Self-pay | Admitting: Obstetrics and Gynecology

## 2019-11-12 ENCOUNTER — Ambulatory Visit (INDEPENDENT_AMBULATORY_CARE_PROVIDER_SITE_OTHER): Payer: 59 | Admitting: Obstetrics and Gynecology

## 2019-11-12 VITALS — BP 120/74 | Ht 64.0 in | Wt 148.0 lb

## 2019-11-12 DIAGNOSIS — Z01419 Encounter for gynecological examination (general) (routine) without abnormal findings: Secondary | ICD-10-CM | POA: Diagnosis not present

## 2019-11-12 DIAGNOSIS — Z1272 Encounter for screening for malignant neoplasm of vagina: Secondary | ICD-10-CM

## 2019-11-12 DIAGNOSIS — M8580 Other specified disorders of bone density and structure, unspecified site: Secondary | ICD-10-CM | POA: Diagnosis not present

## 2019-11-12 NOTE — Progress Notes (Signed)
Kathryn Hendricks 06/30/1956 [id number]  SUBJECTIVE:  64 y.o. [id number] female for annual routine gynecologic exam and Pap smear. She has no gynecologic concerns.  Current Outpatient Medications  Medication Sig Dispense Refill  . albuterol (PROVENTIL HFA;VENTOLIN HFA) 108 (90 Base) MCG/ACT inhaler Inhale 2 puffs into the lungs 4 (four) times daily. 1 Inhaler 4  . amphetamine-dextroamphetamine (ADDERALL) 10 MG tablet TAKE 1 TABLET BY MOUTH WITH WATER BEFORE BREAKFAST AND BEFORE LUNCH 60 tablet 0  . ibuprofen (ADVIL,MOTRIN) 200 MG tablet Take 400 mg by mouth every 6 (six) hours as needed for headache or moderate pain.    Marland Kitchen lisinopril (ZESTRIL) 5 MG tablet TAKE 1 TABLET BY MOUTH ONCE DAILY 90 tablet 1  . simvastatin (ZOCOR) 40 MG tablet Take 1 tablet (40 mg total) by mouth at bedtime. 90 tablet 2  . aspirin 81 MG EC tablet Take 1 tablet (81 mg total) by mouth daily. (Patient not taking: Reported on 11/12/2019) 90 tablet 1   No current facility-administered medications for this visit.   Allergies: Penicillins  No LMP recorded. Patient has had a hysterectomy.  Past medical history,surgical history, problem list, medications, allergies, family history and social history were all reviewed and documented as reviewed in the EPIC chart.  ROS:  Feeling well. No dyspnea or chest pain on exertion.  No abdominal pain, change in bowel habits, black or bloody stools.  No urinary tract symptoms. GYN ROS: no abnormal bleeding, pelvic pain or discharge, no breast pain or new or enlarging lumps on self exam. No neurological complaints.    OBJECTIVE:  BP 120/74   Ht 5\' 4"  (1.626 m)   Wt 148 lb (67.1 kg)   BMI 25.40 kg/m  The patient appears well, alert, oriented x 3, in no distress. ENT normal.  Neck supple. No cervical or supraclavicular adenopathy or thyromegaly.  Lungs are clear, good air entry, no wheezes, rhonchi or rales. S1 and S2 normal, no murmurs, regular rate and rhythm.  Abdomen soft without  tenderness, guarding, mass or organomegaly.  Neurological is normal, no focal findings.  BREAST EXAM: breasts appear normal, no suspicious masses, no skin or nipple changes or axillary nodes  PELVIC EXAM: VULVA: normal appearing vulva with no masses, tenderness or lesions, VAGINA: normal appearing vagina with normal color and discharge, no lesions, CERVIX: surgically absent, UTERUS: surgically absent, vaginal cuff normal and well supported, ADNEXA: no masses or tenderness, RECTAL: Deferred as she will be getting a colonoscopy soon, PAP: Pap smear done today, thin-prep method  Chaperone: [name] present during the examination  ASSESSMENT:  64 y.o. [id number] here for annual gynecologic exam  PLAN:   1.  Postmenopausal.  Prior history of TVH BSO A&P repair in 2018.  Doing well without concerns, normal vaginal cuff support on exam. 2. Pap smear 06/2015.  No significant history of abnormal Pap smears.  Pap smear of vaginal cuff is obtained today.  Option of discontinuing screening is also discussed and the patient would like to continue for now. 3. Mammogram 01/2019.  Normal breast exam today.  She is reminded to schedule an annual mammogram this year when due. 4. Colonoscopy 2016.  Recommended that she follow up at the recommended interval q 5 years per GI given the family history of colon cancer with early onset and passing of her sister due to the disease.   5. Osteopenia.  Baseline DEXA 04/2019.  T score -1.7 AP spine.  FRAX 3.4% / 0.2%.  Encouraged weightbearing exercise, adequate calcium and  vitamin D intake.  We discussed the recommended amounts of calcium and vitamin D supplement to take daily and she will plan to go to the pharmacy to pick up a supplement.  Next DEXA recommended 04/2021 so she plans to schedule this when due. 6. Health maintenance.  No labs today as she normally has these completed with her primary care provider.   Return annually or sooner, prn.  Joseph Pierini MD, FACOG    11/12/19

## 2019-11-12 NOTE — Patient Instructions (Addendum)
We will plan to repeat the DEXA/bone density scan next year.   Continue weight bearing exercise, and vitamin D supplement of at least 1000-2000 IU/day, and calcium intake 1200 mg/day.   When you go to the pharmacy, please ask the pharmacist for a recommendation regarding what supplement to take. Please remember to schedule your annual mammogram this year.

## 2019-11-12 NOTE — Addendum Note (Signed)
Addended by: Nelva Nay on: 11/12/2019 12:27 PM   Modules accepted: Orders

## 2019-11-13 LAB — PAP IG W/ RFLX HPV ASCU

## 2019-11-16 ENCOUNTER — Encounter: Payer: Self-pay | Admitting: Family Medicine

## 2019-11-17 ENCOUNTER — Ambulatory Visit (INDEPENDENT_AMBULATORY_CARE_PROVIDER_SITE_OTHER): Payer: 59 | Admitting: Family Medicine

## 2019-11-17 ENCOUNTER — Other Ambulatory Visit: Payer: Self-pay

## 2019-11-17 ENCOUNTER — Encounter: Payer: Self-pay | Admitting: Family Medicine

## 2019-11-17 DIAGNOSIS — I10 Essential (primary) hypertension: Secondary | ICD-10-CM | POA: Diagnosis not present

## 2019-11-17 DIAGNOSIS — I5032 Chronic diastolic (congestive) heart failure: Secondary | ICD-10-CM | POA: Diagnosis not present

## 2019-11-17 DIAGNOSIS — E7800 Pure hypercholesterolemia, unspecified: Secondary | ICD-10-CM

## 2019-11-17 NOTE — Patient Instructions (Addendum)
[facility]: Nelwyn Salisbury MD  Gastroenterologist in [place], [place]  Address: [address], [place], [place] [address]  Phone: [phone]  It was nice seeing you today. Glad things are working out fine with you and your family. I will like to see you in 4 months. Please call your gastroenterologist to schedule your colonoscopy for September.

## 2019-11-17 NOTE — Assessment & Plan Note (Signed)
I reviewed ECHO done in 2017 with EF of 55% -  60% and G1DD which is trivial. No recent SOB. Continue current BP control regimen. Monitor closely.

## 2019-11-17 NOTE — Assessment & Plan Note (Signed)
BP elevated. She did not take her medication this morning. She is yet to take her breakfast.\ Per the patient, her home BP has been normal. We will not adjust her Lisinopril at this time. Continue home BP monitoring and f/u soon if it remains high. I will see her for routine visit in 4 months.

## 2019-11-17 NOTE — Assessment & Plan Note (Signed)
I reviewed and discussed her recent FLP result with her which was done in July of next year. She is with the impression that she will need lab work today. I advised that we wait till around July of this year to recheck her labs. Continue current dose of Zocor. She agreed with the plan. She will return to [place] in 4 months for fasting lab.

## 2019-11-17 NOTE — Progress Notes (Signed)
    SUBJECTIVE:   CHIEF COMPLAINT / HPI:   HTN/HLD/CHF: She is here for follow-up. She denies chest pain, no SOB, no fatigue. She is compliant with all her meds. Her meds include, Lisinopril 5 mg qd and Zocor 40 mg qd. Here for routine check-up.   PERTINENT  PMH / PSH: Reviewed record.  OBJECTIVE:   BP (!) 142/98   Pulse 62   Ht 5\' 5"  (1.651 m)   Wt 148 lb 3.2 oz (67.2 kg)   SpO2 100%   BMI 24.66 kg/m   Physical Exam Vitals reviewed.  Cardiovascular:     Rate and Rhythm: Normal rate and regular rhythm.     Heart sounds: Normal heart sounds. No murmur.  Pulmonary:     Effort: Pulmonary effort is normal. No respiratory distress.     Breath sounds: Normal breath sounds.  Abdominal:     General: Bowel sounds are normal. There is no distension.     Palpations: Abdomen is soft. There is no mass.     Tenderness: There is no abdominal tenderness.  Musculoskeletal:     Right lower leg: No edema.     Left lower leg: No edema.      ASSESSMENT/PLAN:   Chronic diastolic heart failure I reviewed ECHO done in 2017 with EF of 55% -  60% and G1DD which is trivial. No recent SOB. Continue current BP control regimen. Monitor closely.   HYPERTENSION, BENIGN BP elevated. She did not take her medication this morning. She is yet to take her breakfast.\ Per the patient, her home BP has been normal. We will not adjust her Lisinopril at this time. Continue home BP monitoring and f/u soon if it remains high. I will see her for routine visit in 4 months.  HYPERCHOLESTEROLEMIA I reviewed and discussed her recent FLP result with her which was done in July of next year. She is with the impression that she will need lab work today. I advised that we wait till around July of this year to recheck her labs. Continue current dose of Zocor. She agreed with the plan. She will return to [place] in 4 months for fasting lab.   We discussed GI follow-up for colonoscopy in Sept. Information given on  how to contact GI office for f/u.  Denies calf pain with ambulation. I will delete intermittent claudication from her problem list.  Andrena Mews, MD [place]

## 2019-11-19 ENCOUNTER — Other Ambulatory Visit: Payer: Self-pay | Admitting: Neurology

## 2019-11-19 MED FILL — AMPHETAMINE-DEXTROAMPHETAMI: 10 | 30 days supply | Qty: 60 | Fill #0

## 2019-12-10 ENCOUNTER — Other Ambulatory Visit: Payer: Self-pay

## 2019-12-10 ENCOUNTER — Ambulatory Visit: Payer: 59 | Admitting: Neurology

## 2019-12-10 ENCOUNTER — Encounter: Payer: Self-pay | Admitting: Neurology

## 2019-12-10 VITALS — BP 138/76 | HR 75 | Temp 97.5°F | Ht 64.0 in | Wt 148.0 lb

## 2019-12-10 DIAGNOSIS — I493 Ventricular premature depolarization: Secondary | ICD-10-CM | POA: Diagnosis not present

## 2019-12-10 DIAGNOSIS — I10 Essential (primary) hypertension: Secondary | ICD-10-CM

## 2019-12-10 DIAGNOSIS — G4710 Hypersomnia, unspecified: Secondary | ICD-10-CM

## 2019-12-10 NOTE — Progress Notes (Signed)
NEUROLOGY SLEEP CLINIC   PATIENT: Kathryn Hendricks DOB: 12-11-1955  REASON FOR VISIT: follow up- narcolepsy, mild memory impairment, ADD   PCP Dr. Gwendlyn Deutscher .  HISTORY OF PRESENT ILLNESS: Narcolepsy - EDS, ADD, possible learning disability.    RV 12-10-2019: At the pleasure of seeing Kathryn Hendricks today a 64 year old female patient with a history of narcolepsy cataplexy who in 2016 had a feeling of cognitive decline, last time I saw her she had complained of waking up with headaches sometimes feeling that her brain is foggy and she has a low monthly income that has kept her from obtaining a CPAP therapy.  She did test positive for obstructive sleep apnea however.  We may be able to furnish her with a refurbished CPAP machine.   She underwent a sleep study in November 2020 her apnea index was rather low at 7.3/h in rem sleep however she had an apnea index of 19.4 which makes her a CPAP candidate.  She did not have protracted and prolonged oxygen desaturation and she had rather bradycardic heart rates with a minimum of 47 bpm and a maximum of 85.  She slept mostly in supine position but her highest apnea index was when she slept on her left.  It appears that she is doing best if she is on her right side.   In addition she continues to endorse the Epworth sleepiness score at 20 and the fatigue severity score at 56 points both very highly elevated ratings.   Dr. Leta Baptist did a nerve conduction study with her in November 2020 needle examination of the left upper extremity was normal but she had bilateral mild median neuropathies consistent with carpal tunnel. She may treat this just with a soft brace - no surgery needed.  She is having surgery for rotator cuffs left over right symptoms there- she is left side dominant.    Rv 06-16-2019, patient referred for a question of numbness in the left hand middle-fingers and in toes of the left foot, sometimes having cramps. Not RLS.    Her Memory test  was postponed during Covid, need to do this today.  She has been EDS again, can't find RITALIN of much help- shall try Sunosi.  Marland KitchenEpworth is today 15/24 points, GDS 1/ 15 points.   HISTORY: 05-08-2018, Kathryn Hendricks is a longtime patient of mine, she is meanwhile 64 years of age and she has ongoing difficulties with excessive daytime sleepiness.  She reports that she sometimes has the irresistible urge to go to sleep just for a minute or 2 and these naps may only last a couple of minutes maximal 15 but are needed for her to face the next 2 hours of the day.  She needs these naps to recharge and short intervals.  Unfortunately this interferes with her social life and she has been found falling asleep in front of the grocery store when the rest of her family expected her to come in.  She has been treated with Adderall which has helped somewhat with sleepiness but has not had the long-lasting effect.  She has now developed trouble to sleep at night however- and we need to revisit the stimulant medication. Her daughter is very concerned. Epworth Sleepiness score endorsed at 20-24 points, FSS at 57/63 points, she reports not feeling depressed, not having new medical problems or medication changes.   11-05-2017.I have the pleasure to meet today with Kathryn Hendricks on 05 November 2017.  Kathryn Hendricks is a 64 year old  African-American right-handed female with a diagnosis of narcolepsy, possibly cataplexy, and a concern of mild memory impairment.  We suspect that the patient had ADD which affected her ability to perform well in school and she may have a learning disability in addition.  She is treated currently with Adderall as a stimulant to keep her from falling asleep but this is not always working or not working long enough to allow her to participate in a full day's work.  It also leaves her still with a very high degree of daytime sleepiness, unsafe to operate machinery or drive.  Today's Epworth score was endorsed at 20  points out of 24 possible points, she has not yet taken medication for this day.  Her fatigue severity was endorsed at 52 points/ 63 possible points. 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  She also mentioned that she had just a 7th grandchild , 3 month ago.   Today 03/05/17, Kathryn Hendricks is a 64 year old female with a history of narcolepsy and mild memory impairment in ADD. She returns today for follow-up. She reports that she continues to take Adderall 10 mg daily. She does feel that it may not be as effective as it was when she initially started the medication. She says later in the day she is very sleepy. She reports that she does recently had a hysterectomy. She states since then she has noticed increased mood swings. However she does report that she was having some mood swings before the surgery. She does note that she is very forgetful. She currently lives at home alone. She is able to complete all ADLs. She does operate a motor vehicle and occasionally will get lost but is able to correct this. She returns today for an evaluation  HISTORY 06-19-2016 . Kathryn Hendricks is a 64 year old  Afro-american single female with a history of narcolepsy, mild memory impairment and ADD.  There is a suspicion of a life long learning disability . She lost her sister in late October 2016. She is currently taking Adderall 10 mg daily- works fairly well. 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 She states that usually by the end of her shift she is exhausted. She states that sometimes when she goes home if she tries to nap she will sleep for hours. She is able to complete all ADLs independently. She will undergo a hysterectomy within the next couple of months, she recently saw her cardiologist and was told that her cardiac function is fine, according to her. Fatigue severity is still very high for this patient, a 59 points, the Epworth sleepiness score is  still endorsed at 18 points, this makes driving unsafe for her.   04/27/15 (Dezmon Conover): Kathryn Hendricks is a 64 y.o. female patient seen here as are visitfrom Dr. Gwendlyn Deutscher .The patient was last seen on 01-19-15 .Kathryn Hendricks is a 64 y.o. female seen here as a referral from Dr. Gwendlyn Deutscher for treatment of recently diagnosed Narcolepsy and evaluation of memory concerns. Kathryn Hendricks reports that she had increasing difficulties with sleepiness and felt increasingly fatigued, she was not aware that she had physically exerted herself. Her primary care physician at the time was [name] and he referred her for a sleep study in 2014. She underwent PSG testing on 06-09-13 at the [facility] laboratory, resulting in a very normal PSG , but with a sleep latency of 3 minutes , a REM  latency  of 67.5 minutes.  Her sleep efficiency was 85.3% and her total sleep time 331 minutes. There were no significant apneas or hypopneas noted, and her AHI was 3.8. There were no periodic limb movements noted.  Study was deemed valid for an MSLT to follow.  The MSLT documented 5 naps with a mean sleep latency of 0.6 minutes and two naps with sleep REM onsets.  This test is diagnostic for the condition of narcolepsy , the MSLT took place on 08-05-13.   Kathryn Hendricks has endorsed again an Epworth Sleepiness Scale of 24 points at the fatigue severity score of 62 out of a possible 63, points. She had stopped modafinil in January after she felt it had become ineffective and told this to Dr. Halford Chessman, her treating sleep physician, only after the fact.  She reported falling asleep and feeling tired all the time again she has been is especially being concerned because she is falling asleep when driving. She has tried not to drive alone. She was advised that she is not safe to drive or operate machinery !! The patient endorsed no cataplectic attacks. She also stated that the Provigil made her feel short of breath. Just  yesterday she fell asleep at the dinner table when her sister was her Guest of on her. She was very embarrassed. She goes to bed between 7 and 8 PM and usually is asleep promptly. She will sleep through until 5-5:30 AM.  She will go once to the bathroom at night on average. He may have throat little periods of waking up but usually is able to fall asleep right away again. Her sleep does not feel restorative to her. She has lots of vivid dreams and sometimes dreams of being chased or by the nightmarish content. She has noticed more trouble with her memory but may be fatigue related but could be an independent finding. She feels sometimes clumsy and unsteady when walking. She is upsetting her family since she begun to repeat herself frequently and has been displacing things. She has more trouble with spelling. I would like to add that I was able to review her sleep studies here in person and an echo- cardiogram performed on 06-16-13 with a normal ejection fraction of 65% at grade 1 diastolic dysfunction and mild L a dilated patient. They have been previously no focal neurologic deficits noted.   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 She frequenlty fell asleep in school. She was napping daily after school. Life long complaint of excessive daytimesleepiness.She had a sleep study at the [facility] and  Dr . Halford Chessman has followed her.  Interval history from 8-31-2016Chief complaint according to patient : Kathryn Hendricks is here today to discuss the results of an MRI study obtained earlier this summer. She received a call that there were no acute findings but she would like to know what exactly the findings mean. She's also endorsing still a very high degree of daytime sleepiness, 20-24 points . Within our session today we obtained a Mini-Mental Status Examination .  Mrs.  Hendricks's MRI of the brain from 01-27-15 documented an underdeveloped splenium of the corpus callosum, a thin lipoma and periventricular FLAIR hyperintensities which we present most likely developmental changes, and no acute findings were seen. This MRI could not be compared to previous MRIs as none were available. Kathryn Hendricks's question if  her learning disabilities could be related to an abnormality of the splenium have to be answered as a cautious possibility, this would not explain her hypersomnia and a discussed with her that her excessive daytime sleepiness is not related to the changes in the brain as noted. Her cognitive difficulties have been with her all her life, and a Mini-Mental Status Examination today to exclude the attention and calculation part. The patient scored 25 out of 30 points. This has been a stable finding she was also able to copy an image, she was able to draw a clock face and she named 14 animals in a fluency test.  Sleep habits are as follows: She reports falling asleep in public places, for example in a restaurant in the waiting room etc. she's not able safely to operate machinery she's not able to safely drive because of her excessive daytime sleepiness. She has been prescribed modafinil as well as a stimulants which have given her a little bit more energy but not having a lasting effect. I would be hesitant to consider this patient a good candidate for Xyrem.  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 The patient works 8 to 16.30 .  06-19-2016 . Kathryn Hendricks is a 64 year old  Afro-american single female with a history of narcolepsy, mild memory impairment and ADD.  There is a suspicion of a life long learning  disability . She lost her sister in late October 2016. She is currently taking Adderall 10 mg daily- works fairly well. 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  She states that  usually by the end of her shift she is exhausted. She states that sometimes when she goes home if she tries to nap she will sleep for hours. She is able to complete all ADLs independently. She will undergo a hysterectomy within the next couple of months, she recently saw her cardiologist and was told that her cardiac function is fine, according to her. Fatigue severity is still very high for this patient, a 59 points, the Epworth sleepiness score is still endorsed at 18 points, this makes driving unsafe for her.  She inquires about FMLA for her job.   REVIEW OF SYSTEMS: Out of a complete 14 system review of symptoms, the patient complains only of the following symptoms, and all other reviewed systems are negative.  Easily distracted,  Hypersomnia is persistent.   How likely are you to doze in the following situations: 0 = not likely, 1 = slight chance, 2 = moderate chance, 3 = high chance  Sitting and Reading? Watching Television? Sitting inactive in a public place (theater or meeting)? Lying down in the afternoon when circumstances permit? Sitting and talking to someone? Sitting quietly after lunch without alcohol? In a car, while stopped for a few minutes in traffic? As a passenger in a car for an hour without a break?  Total = 20/ 24   Negative HLA narcolepsy panel.     ALLERGIES: Allergies  Allergen Reactions  . Penicillins Shortness Of Breath, Itching and Swelling    Has patient had a PCN reaction causing immediate rash, facial/tongue/throat swelling, SOB or lightheadedness with hypotension: Yes Has patient had a PCN reaction causing severe rash involving mucus membranes or skin necrosis: No Has patient had a PCN reaction that required hospitalization: No Has patient had a PCN reaction occurring within the last 10 years: No If all of the above  answers are "NO", then may proceed with Cephalosporin use.     HOME MEDICATIONS: Outpatient Medications Prior to Visit  Medication  Sig Dispense Refill  . albuterol (PROVENTIL HFA;VENTOLIN HFA) 108 (90 Base) MCG/ACT inhaler Inhale 2 puffs into the lungs 4 (four) times daily. 1 Inhaler 4  . amphetamine-dextroamphetamine (ADDERALL) 10 MG tablet TAKE 1 TABLET BY MOUTH WITH WATER BEFORE BREAKFAST AND BEFORE LUNCH 60 tablet 0  . ibuprofen (ADVIL,MOTRIN) 200 MG tablet Take 400 mg by mouth every 6 (six) hours as needed for headache or moderate pain.    Marland Kitchen lisinopril (ZESTRIL) 5 MG tablet TAKE 1 TABLET BY MOUTH ONCE DAILY 90 tablet 1  . simvastatin (ZOCOR) 40 MG tablet Take 1 tablet (40 mg total) by mouth at bedtime. 90 tablet 2   No facility-administered medications prior to visit.    PAST MEDICAL HISTORY: Past Medical History:  Diagnosis Date  . Abdominal pain, left lower quadrant 11/08/2015  . Abnormal Pap smear 2011  . Anxiety   . Arthritis    lower back and shoulders  . Bronchitis    hx - used abluterol inhaler  . Chronic diastolic heart failure ([place])   . Cognitive decline   . FIBROIDS, UTERUS 07/02/2009   Qualifier: Diagnosis of  By: Ta MD, Cat    . GERD (gastroesophageal reflux disease)    occasional  . Hyperlipidemia   . Hypertension   . Left shoulder pain 02/14/2016  . Narcolepsy   . Osteopenia 04/2019   T score -1.7 FRAX 3.4% / 0.2%  . Premature atrial contractions   . PVC's (premature ventricular contractions)   . Seasonal allergies   . SVD (spontaneous vaginal delivery)    x 3    PAST SURGICAL HISTORY: Past Surgical History:  Procedure Laterality Date  . ANTERIOR AND POSTERIOR REPAIR N/A 02/19/2017   Procedure: ANTERIOR (CYSTOCELE);  Surgeon: Bjorn Loser, MD;  Location: [facility];  Service: Urology;  Laterality: N/A;  . APPENDECTOMY    . COLONOSCOPY    . CYSTOSCOPY N/A 02/19/2017   Procedure: CYSTOSCOPY;  Surgeon: Bjorn Loser, MD;  Location: [facility];  Service: Urology;  Laterality: N/A;  . DILATION AND CURETTAGE OF UTERUS  2010  . HYSTEROSCOPY W/ ENDOMETRIAL ABLATION  2010  .  SALPINGOOPHORECTOMY Bilateral 02/19/2017   Procedure: SALPINGO OOPHORECTOMY;  Surgeon: Anastasio Auerbach, MD;  Location: [facility];  Service: Gynecology;  Laterality: Bilateral;  . TUBAL LIGATION    . VAGINAL HYSTERECTOMY N/A 02/19/2017   Procedure: HYSTERECTOMY VAGINAL ,  BSO.;  Surgeon: Anastasio Auerbach, MD;  Location: [facility];  Service: Gynecology;  Laterality: N/A;  DO NOT OPEN LAP INSTRUMENTS  . WISDOM TOOTH EXTRACTION      FAMILY HISTORY: Family History  Problem Relation Age of Onset  . Hypertension Mother   . Diabetes Mother   . Stroke Mother   . CAD Mother 33  . Narcolepsy Mother        not diagnosed  . Hypertension Sister   . Cancer Sister        Colon cancer  . Hypertension Brother   . Cancer Brother        throat  . Diabetes Father   . Heart disease Father     SOCIAL HISTORY: Social History   Socioeconomic History  . Marital status: Divorced    Spouse name: Not on file  . Number of children: 3  . Years of education: 42  . Highest education level: Not on file  Occupational History  .  Occupation: Microbiologist: [employer]  Tobacco Use  . Smoking status: Never Smoker  . Smokeless tobacco: Never Used  Substance and Sexual Activity  . Alcohol use: No    Alcohol/week: 0.0 standard drinks  . Drug use: No  . Sexual activity: Never    Birth control/protection: Post-menopausal, Surgical    Comment: 1st intercourse 43 yo-5 partners  Other Topics Concern  . Not on file  Social History Narrative   Caffeine 1 cup coffee in am.   Social Determinants of Health   Financial Resource Strain:   . Difficulty of Paying Living Expenses:   Food Insecurity:   . Worried About Charity fundraiser in the Last Year:   . Arboriculturist in the Last Year:   Transportation Needs:   . Film/video editor (Medical):   Marland Kitchen Lack of Transportation (Non-Medical):   Physical Activity:   . Days of Exercise per Week:   . Minutes of Exercise per Session:   Stress:   .  Feeling of Stress :   Social Connections:   . Frequency of Communication with Friends and Family:   . Frequency of Social Gatherings with Friends and Family:   . Attends Religious Services:   . Active Member of Clubs or Organizations:   . Attends Archivist Meetings:   Marland Kitchen Marital Status:   Intimate Partner Violence:   . Fear of Current or Ex-Partner:   . Emotionally Abused:   Marland Kitchen Physically Abused:   . Sexually Abused:       PHYSICAL EXAM  Vitals:   12/10/19 1044  BP: 138/76  Pulse: 75  Temp: (!) 97.5 F (36.4 C)  Weight: 148 lb (67.1 kg)  Height: 5' 4"  (1.626 m)   Body mass index is 25.4 kg/m.  MMSE - Mini Mental State Exam 03/05/2017 06/19/2016 11/03/2015  Orientation to time 4 4 5   Orientation to Place 5 5 5   Registration 3 3 3   Attention/ Calculation 0 2 4  Recall 1 2 2   Language- name 2 objects 2 2 2   Language- repeat 0 0 0  Language- follow 3 step command 3 3 2   Language- read & follow direction 1 1 1   Write a sentence 0 0 1  Copy design 1 0 0  Total score 20 22 25     Finished High school , had special education classes. Writing and calculation ability is poor.   General: The patient is awake, alert and appears not in acute distress. The patient is well groomed. Head: Normocephalic, atraumatic. Neck is supple. Mallampati 3, neck circumference:13 inches  Cardiovascular:  Regular rate and rhythm , without  murmurs or carotid bruit, and without distended neck veins. Respiratory: Lungs are clear to auscultation.Skin:  Without evidence of edema, or rash  Neurologic exam : The patient is awake and alert, oriented to place and time. Memory subjective described as impaired- There is a reduced attention span & concentration ability. Speech is fluent with mild  dysarthria, dysphonia or aphasia. Mood and affect are appropriate.  Cranial nerves: Pupils are equal and briskly reactive to light. Extraocular movements  in vertical and horizontal planes intact and  without nystagmus. Visual fields by finger perimetry are intact.Hearing to finger rub intact.   Facial sensation intact to fine touch. Facial motor strength is symmetric and tongue and uvula move midline.  Motor exam: Normal tone and normal muscle bulk - there is loss of grip strength and thenar eminence is atrophic.  Sensory:  Fine touch and vibration were normal to ankle and wrist. . Coordination:  Finger-to-nose maneuver without evidence of ataxia or tremor. Gait and station: Patient walks without assistive device. Strength within normal limits.  She is not stooped, no drifting . She walks with a normal base. Stance is stable and normal- Steps are unfragmented.  Deep tendon reflexes: in the  upper and lower extremities are symmetric, 2 plus. No clonus.    DIAGNOSTIC DATA (LABS, IMAGING, TESTING) - I reviewed patient records, labs, notes, testing and imaging myself where available. Labs from 2020 were reviewed.   ideopathic hypersomnia versus narcolepsy- HLA test negative in 2019, MSLT was positive ( ?)  New family history - a cousin was diagnosed with narcolepsy after sleep related  MVA - her grandson is diagnosed with ADD. 2 grandchildren are very sleepy.   ASSESSMENT AND PLAN:  1.Narcolepsy poorly was controlled on Adderall, one a day 10 mg. No HTN resulting. persistent hypersomnia in narcolepsy. Cataplexy described as "dropping objects" when startled.  Sunosi caused tachycardia. .NO Atrial fibrillation was diagnosed- she has SVT -SVT and adderall are not a good mix, but she is unable to function on ritalin- since she failed Sunosi.  2. Carpal tunnel soft braces.  3) headaches - I unlikely related to  OSA- mild and not causing hypoxia- there is no nausea,  No photophobia.   I think HA in AM may be related to HTN. Today BP was 138/ 76. She sleeps until 5 AM, works early shifts. Takes tylenol.     4) cognitive function monitoring . I will schedule her again for MMSE with NP next  autumn.      Larey Seat, MD  12/10/2019, 11:06 AM [facility] [address] [place], [place] [address] [phone]

## 2019-12-10 NOTE — Patient Instructions (Signed)

## 2020-01-01 MED FILL — SIMVASTATIN 40 MG TABLET: 40 | 90 days supply | Qty: 90 | Fill #2

## 2020-01-14 ENCOUNTER — Other Ambulatory Visit: Payer: Self-pay | Admitting: Neurology

## 2020-01-15 ENCOUNTER — Other Ambulatory Visit: Payer: Self-pay | Admitting: Neurology

## 2020-01-15 NOTE — Telephone Encounter (Signed)
Patient called on-call physician, has completed out of her Adderall 10 mg twice a day, check controlled substance registry, last refill was November 19, 2019,  Attempted, but I could not e prescribe. I have called patient to come to office to pick up Hard Copy

## 2020-01-18 MED FILL — AMPHETAMINE-DEXTROAMPHETAMI: 10 | 30 days supply | Qty: 60 | Fill #0

## 2020-01-18 NOTE — Telephone Encounter (Signed)
Pt called stating she has been out of her medication amphetamine-dextroamphetamine (ADDERALL) 10 MG tablet and is needing it filled as soon as possible. Pt is not able to focus and is feeling really tired. Pt is needing it sent to the [facility]. Please advise.

## 2020-01-18 NOTE — Telephone Encounter (Signed)
I have routed this request to Dr Brett Fairy for review. The pt is due for the medication and [place] registry was verified. Waiting on Dr Brett Fairy to send for the patient.

## 2020-01-18 NOTE — Telephone Encounter (Signed)
I signed refill already.

## 2020-01-18 NOTE — Telephone Encounter (Signed)
Pt called back wanting to know what the update is since she is at the pharmacy now. Pt was informed that the message has been routed to the provider and we are just waiting on provider. Pt states that her pharmacy closes at 6 and she would like to be able to get it today due to being completely out oh her Adderall. Please advise.

## 2020-01-19 NOTE — Telephone Encounter (Signed)
Called the patient to make her aware that the refill was sent yesterday evening. She verbalized understanding and was appreciative.

## 2020-01-21 ENCOUNTER — Other Ambulatory Visit: Payer: Self-pay | Admitting: Family Medicine

## 2020-01-21 DIAGNOSIS — Z1231 Encounter for screening mammogram for malignant neoplasm of breast: Secondary | ICD-10-CM

## 2020-01-21 DIAGNOSIS — G4733 Obstructive sleep apnea (adult) (pediatric): Secondary | ICD-10-CM | POA: Diagnosis not present

## 2020-02-10 DIAGNOSIS — M25512 Pain in left shoulder: Secondary | ICD-10-CM | POA: Diagnosis not present

## 2020-02-10 DIAGNOSIS — M7542 Impingement syndrome of left shoulder: Secondary | ICD-10-CM | POA: Diagnosis not present

## 2020-02-16 ENCOUNTER — Telehealth: Payer: Self-pay | Admitting: Family Medicine

## 2020-02-16 NOTE — Progress Notes (Signed)
CARDIOLOGY OFFICE NOTE  Date:  02/23/2020    Kathryn Hendricks Date of Birth: Jun 05, 1956 Medical Record #[id number]  PCP:  Kinnie Feil, MD  Cardiologist:   [facility]  Chief Complaint  Patient presents with  . Follow-up    Seen for Dr. Angelena Form    History of Present Illness: Kathryn Hendricks is a 64 y.o. female who presents today for a work in visit. Seen for Dr. Angelena Form .  She has a history of chronic diastolic heart failure, hypertension, HLD, narcolepsy and PVCs found on prior monitor. EF is normal. Grade 1 DD from echo in 2017. Low risk Myoview in 2017.   She was last seen by Lyda Jester, PA back in April of 2019. Non cardiac chest pain endorsed. Having dysphagia - was referred to GI.   The patient does not have symptoms concerning for COVID-19 infection (fever, chills, cough, or new shortness of breath).   Comes in today. Here alone. Notes a few issues - getting more short of breath and a little lightheaded with rushing and running. Works for [employer] - one morning was running back to car to get her badge - was very short of breath. She noted heart beating fast - this seems to come first followed by the shortness of breath and then getting lightheaded. Getting worse - has had over the last year. Has been found to have OSA - has CPAP - does not use regularly. No chest pain. She admits to lots of fast food - therefore, too much salt.   Past Medical History:  Diagnosis Date  . Abdominal pain, left lower quadrant 11/08/2015  . Abnormal Pap smear 2011  . Anxiety   . Arthritis    lower back and shoulders  . Bronchitis    hx - used abluterol inhaler  . Chronic diastolic heart failure ([place])   . Cognitive decline   . FIBROIDS, UTERUS 07/02/2009   Qualifier: Diagnosis of  By: Ta MD, Cat    . GERD (gastroesophageal reflux disease)    occasional  . Hyperlipidemia   . Hypertension   . Left shoulder pain 02/14/2016  . Narcolepsy   . Osteopenia 04/2019   T score -1.7  FRAX 3.4% / 0.2%  . Premature atrial contractions   . PVC's (premature ventricular contractions)   . Seasonal allergies   . SVD (spontaneous vaginal delivery)    x 3    Past Surgical History:  Procedure Laterality Date  . ANTERIOR AND POSTERIOR REPAIR N/A 02/19/2017   Procedure: ANTERIOR (CYSTOCELE);  Surgeon: Bjorn Loser, MD;  Location: [facility];  Service: Urology;  Laterality: N/A;  . APPENDECTOMY    . COLONOSCOPY    . CYSTOSCOPY N/A 02/19/2017   Procedure: CYSTOSCOPY;  Surgeon: Bjorn Loser, MD;  Location: [facility];  Service: Urology;  Laterality: N/A;  . DILATION AND CURETTAGE OF UTERUS  2010  . HYSTEROSCOPY W/ ENDOMETRIAL ABLATION  2010  . SALPINGOOPHORECTOMY Bilateral 02/19/2017   Procedure: SALPINGO OOPHORECTOMY;  Surgeon: Anastasio Auerbach, MD;  Location: [facility];  Service: Gynecology;  Laterality: Bilateral;  . TUBAL LIGATION    . VAGINAL HYSTERECTOMY N/A 02/19/2017   Procedure: HYSTERECTOMY VAGINAL ,  BSO.;  Surgeon: Anastasio Auerbach, MD;  Location: [facility];  Service: Gynecology;  Laterality: N/A;  DO NOT OPEN LAP INSTRUMENTS  . WISDOM TOOTH EXTRACTION       Medications: Current Meds  Medication Sig  . albuterol (PROVENTIL HFA;VENTOLIN HFA) 108 (90 Base) MCG/ACT inhaler Inhale  2 puffs into the lungs 4 (four) times daily.  Marland Kitchen amphetamine-dextroamphetamine (ADDERALL) 10 MG tablet TAKE 1 TABLET BY MOUTH WITH WATER BEFORE BREAKFAST AND BEFORE LUNCH  . ibuprofen (ADVIL,MOTRIN) 200 MG tablet Take 400 mg by mouth every 6 (six) hours as needed for headache or moderate pain.  Marland Kitchen lisinopril (ZESTRIL) 5 MG tablet TAKE 1 TABLET BY MOUTH ONCE DAILY  . simvastatin (ZOCOR) 40 MG tablet Take 1 tablet (40 mg total) by mouth at bedtime.     Allergies: Allergies  Allergen Reactions  . Penicillins Shortness Of Breath, Itching and Swelling    Has patient had a PCN reaction causing immediate rash, facial/tongue/throat swelling, SOB or lightheadedness with hypotension: Yes Has  patient had a PCN reaction causing severe rash involving mucus membranes or skin necrosis: No Has patient had a PCN reaction that required hospitalization: No Has patient had a PCN reaction occurring within the last 10 years: No If all of the above answers are "NO", then may proceed with Cephalosporin use.     Social History: The patient  reports that she has never smoked. She has never used smokeless tobacco. She reports that she does not drink alcohol and does not use drugs.   Family History: The patient's family history includes CAD (age of onset: 86) in her mother; Cancer in her brother and sister; Diabetes in her father and mother; Heart disease in her father; Hypertension in her brother, mother, and sister; Narcolepsy in her mother; Stroke in her mother.   Review of Systems: Please see the history of present illness.   All other systems are reviewed and negative.   Physical Exam: VS:  BP 140/86   Pulse (!) 58   Ht 5\' 4"  (1.626 m)   Wt 146 lb 3.2 oz (66.3 kg)   SpO2 97%   BMI 25.10 kg/m  .  BMI Body mass index is 25.1 kg/m.  Wt Readings from Last 3 Encounters:  02/23/20 146 lb 3.2 oz (66.3 kg)  12/10/19 148 lb (67.1 kg)  11/17/19 148 lb 3.2 oz (67.2 kg)   BP by me is down to 124/80  General: Pleasant. Well developed, well nourished and in no acute distress.   Neck: Supple, no JVD, carotid bruits, or masses noted.  Cardiac: Regular rate and rhythm. No murmurs, rubs, or gallops. No edema.  Respiratory:  Lungs are clear to auscultation bilaterally with normal work of breathing.  GI: Soft and nontender.  MS: No deformity or atrophy. Gait and ROM intact.  Skin: Warm and dry. Color is normal.  Neuro:  Strength and sensation are intact and no gross focal deficits noted.  Psych: Alert, appropriate and with normal affect.   LABORATORY DATA:  EKG:  EKG is ordered today.  Personally reviewed by me. This demonstrates sinus bradycardia - HR is 58 today.  Lab Results    Component Value Date   WBC 4.2 11/14/2017   HGB 12.4 11/14/2017   HCT 39.3 11/14/2017   PLT 217 11/14/2017   GLUCOSE 86 03/10/2019   CHOL 226 (H) 03/10/2019   TRIG 71 03/10/2019   HDL 67 03/10/2019   LDLDIRECT 136 (H) 08/09/2008   LDLCALC 145 (H) 03/10/2019   ALT 14 11/14/2017   AST 16 11/14/2017   NA 143 03/10/2019   K 4.8 03/10/2019   CL 104 03/10/2019   CREATININE 0.66 03/10/2019   BUN 17 03/10/2019   CO2 25 03/10/2019   TSH 1.820 03/10/2019   INR 0.95 02/07/2017   HGBA1C 5.6  06/18/2017     BNP (last 3 results) No results for input(s): BNP in the last 8760 hours.  ProBNP (last 3 results) No results for input(s): PROBNP in the last 8760 hours.   Other Studies Reviewed Today:  Myoview Study Highlights 2017   Nuclear stress EF: 69%.  There was no ST segment deviation noted during stress.  The study is normal.  This is a low risk study.  The left ventricular ejection fraction is hyperdynamic (>65%).  Echo Study Conclusions 2017  - Left ventricle: The cavity size was normal. Wall thickness was  normal. Systolic function was normal. The estimated ejection  fraction was in the range of 55% to 60%. Wall motion was normal;  there were no regional wall motion abnormalities. Doppler  parameters are consistent with abnormal left ventricular  relaxation (grade 1 diastolic dysfunction).  - Left atrium: The atrium was mildly dilated.  - Pericardium, extracardiac: A trivial pericardial effusion was  identified.   Impressions:   - Normal LV systolic function; probable mild diastolic dysfunction;  mild LAE; trace MR and mild TR.     Assessment/Plan:  1. DOE - known diastolic dysfunction - will get her echo updated. Suspect too much salt use - needs to cut back.   2. Palpitations/heart racing - will arrange for 14 day Zio. She is not on any beta blocker - resting HR is just 58. Lab today.   3. Resting bradycardia - not on any rate slowing  medicines.   4. HTN - recheck by me is ok - would follow for now - on low dose ACE  5. HLD - on statin - needs labs.   6. Narcolepsy - on chronic stimulant therapy. This could certainly be driving her sensation of elevated HRs.   Current medicines are reviewed with the patient today.  The patient does not have concerns regarding medicines other than what has been noted above.  The following changes have been made:  See above.  Labs/ tests ordered today include:    Orders Placed This Encounter  Procedures  . Basic metabolic panel  . CBC  . Hepatic function panel  . Lipid panel  . TSH  . LONG TERM MONITOR (3-14 DAYS)  . EKG 12-Lead  . ECHOCARDIOGRAM COMPLETE     Disposition:   Will see how her studies turn out and then decide about follow up.     Patient is agreeable to this plan and will call if any problems develop in the interim.   SignedTruitt Merle, NP  02/23/2020 10:54 AM  [facility] [address] [place], [place]  [address] Phone: [phone] Fax: [phone]

## 2020-02-16 NOTE — Telephone Encounter (Signed)
HIPAA compliant callback message left.  Please let her know that I got an FMLA form for her. She need an appointment for this as the last time I saw her for shoulder pain was in 2017/2018.   I know she saw orthopedic recently. They cal also complete her FMLA.

## 2020-02-17 NOTE — Telephone Encounter (Signed)
Appt made for 02-23-2020.  Ortho informed her since she isn't getting the surgery right away that she needs to get these forms completed by her primary.  Jensine Luz,CMA

## 2020-02-19 ENCOUNTER — Other Ambulatory Visit: Payer: Self-pay

## 2020-02-19 ENCOUNTER — Ambulatory Visit
Admission: RE | Admit: 2020-02-19 | Discharge: 2020-02-19 | Disposition: A | Discharge status: Home / Self Care | Payer: 59 | Source: Ambulatory Visit | Attending: Family Medicine | Admitting: Family Medicine

## 2020-02-19 DIAGNOSIS — Z1231 Encounter for screening mammogram for malignant neoplasm of breast: Secondary | ICD-10-CM

## 2020-02-21 DIAGNOSIS — G4733 Obstructive sleep apnea (adult) (pediatric): Secondary | ICD-10-CM | POA: Diagnosis not present

## 2020-02-23 ENCOUNTER — Ambulatory Visit (INDEPENDENT_AMBULATORY_CARE_PROVIDER_SITE_OTHER): Payer: 59

## 2020-02-23 ENCOUNTER — Other Ambulatory Visit: Payer: Self-pay

## 2020-02-23 ENCOUNTER — Ambulatory Visit: Payer: 59 | Admitting: Nurse Practitioner

## 2020-02-23 ENCOUNTER — Ambulatory Visit: Payer: 59 | Admitting: Family Medicine

## 2020-02-23 ENCOUNTER — Encounter: Payer: Self-pay | Admitting: Nurse Practitioner

## 2020-02-23 ENCOUNTER — Encounter: Payer: Self-pay | Admitting: *Deleted

## 2020-02-23 VITALS — BP 140/86 | HR 58 | Ht 64.0 in | Wt 146.2 lb

## 2020-02-23 DIAGNOSIS — R002 Palpitations: Secondary | ICD-10-CM

## 2020-02-23 DIAGNOSIS — I10 Essential (primary) hypertension: Secondary | ICD-10-CM

## 2020-02-23 DIAGNOSIS — I493 Ventricular premature depolarization: Secondary | ICD-10-CM

## 2020-02-23 DIAGNOSIS — R0609 Other forms of dyspnea: Secondary | ICD-10-CM

## 2020-02-23 DIAGNOSIS — R0600 Dyspnea, unspecified: Secondary | ICD-10-CM | POA: Diagnosis not present

## 2020-02-23 DIAGNOSIS — I5032 Chronic diastolic (congestive) heart failure: Secondary | ICD-10-CM | POA: Diagnosis not present

## 2020-02-23 DIAGNOSIS — E782 Mixed hyperlipidemia: Secondary | ICD-10-CM | POA: Diagnosis not present

## 2020-02-23 LAB — CBC
Hematocrit: 38.3 % (ref 34.0–46.6)
Hemoglobin: 12.2 g/dL (ref 11.1–15.9)
MCH: 28.4 pg (ref 26.6–33.0)
MCHC: 31.9 g/dL (ref 31.5–35.7)
MCV: 89 fL (ref 79–97)
Platelets: 215 10*3/uL (ref 150–450)
RBC: 4.3 x10E6/uL (ref 3.77–5.28)
RDW: 13.1 % (ref 11.7–15.4)
WBC: 4.6 10*3/uL (ref 3.4–10.8)

## 2020-02-23 LAB — HEPATIC FUNCTION PANEL
ALT: 19 IU/L (ref 0–32)
AST: 20 IU/L (ref 0–40)
Albumin: 4.3 g/dL (ref 3.8–4.8)
Alkaline Phosphatase: 106 IU/L (ref 48–121)
Bilirubin Total: 0.3 mg/dL (ref 0.0–1.2)
Bilirubin, Direct: 0.09 mg/dL (ref 0.00–0.40)
Total Protein: 6.6 g/dL (ref 6.0–8.5)

## 2020-02-23 LAB — LIPID PANEL
Chol/HDL Ratio: 2.4 ratio (ref 0.0–4.4)
Cholesterol, Total: 149 mg/dL (ref 100–199)
HDL: 63 mg/dL (ref >39)
LDL Chol Calc (NIH): 76 mg/dL (ref 0–99)
Triglycerides: 44 mg/dL (ref 0–149)
VLDL Cholesterol Cal: 10 mg/dL (ref 5–40)

## 2020-02-23 LAB — BASIC METABOLIC PANEL
BUN/Creatinine Ratio: 14 (ref 12–28)
BUN: 10 mg/dL (ref 8–27)
CO2: 27 mmol/L (ref 20–29)
Calcium: 9.2 mg/dL (ref 8.7–10.3)
Chloride: 106 mmol/L (ref 96–106)
Creatinine, Ser: 0.69 mg/dL (ref 0.57–1.00)
GFR calc Af Amer: 108 mL/min/{1.73_m2} (ref >59)
GFR calc non Af Amer: 94 mL/min/{1.73_m2} (ref >59)
Glucose: 91 mg/dL (ref 65–99)
Potassium: 4.3 mmol/L (ref 3.5–5.2)
Sodium: 141 mmol/L (ref 134–144)

## 2020-02-23 LAB — TSH: TSH: 1.4 u[IU]/mL (ref 0.450–4.500)

## 2020-02-23 NOTE — Patient Instructions (Addendum)
After Visit Summary:  We will be checking the following labs today - BMET, CBC, HPF, Lipids and TSH   Medication Instructions:    Continue with your current medicines.    If you need a refill on your cardiac medications before your next appointment, please call your pharmacy.     Testing/Procedures To Be Arranged:  Echocardiogram  Zio 14 day monitor  Follow-Up:   Let's see how your studies turn out and then we will decide about follow up.     At [facility], you and your health needs are our priority.  As part of our continuing mission to provide you with exceptional heart care, we have created designated Provider Care Teams.  These Care Teams include your primary Cardiologist (physician) and Advanced Practice Providers (APPs -  Physician Assistants and Nurse Practitioners) who all work together to provide you with the care you need, when you need it.  Special Instructions:  . Stay safe, wash your hands for at least 20 seconds and wear a mask when needed.  . It was good to talk with you today.    Call the [place] office at [phone] if you have any questions, problems or concerns.

## 2020-03-08 ENCOUNTER — Other Ambulatory Visit: Payer: Self-pay | Admitting: Neurology

## 2020-03-09 ENCOUNTER — Telehealth: Payer: Self-pay | Admitting: *Deleted

## 2020-03-09 NOTE — Telephone Encounter (Signed)
Submitted PA on CMM. Key: [id number] - PA Case ID: [id number]. Waiting on determination from [place].

## 2020-03-14 NOTE — Telephone Encounter (Signed)
Received a form from [employer] asking if the patient had tried Evekeo. I do not see where this is ordered or attempted to be ordered from our office. Completed the form and sent to the fax # provided on the form. Will wait for determination. Generic adderall according to the paper should be covered and PA should not be necessary

## 2020-03-17 ENCOUNTER — Other Ambulatory Visit: Payer: Self-pay

## 2020-03-17 ENCOUNTER — Ambulatory Visit (HOSPITAL_COMMUNITY): Payer: 59 | Attending: Cardiology

## 2020-03-17 DIAGNOSIS — I5032 Chronic diastolic (congestive) heart failure: Secondary | ICD-10-CM | POA: Diagnosis not present

## 2020-03-17 DIAGNOSIS — R0609 Other forms of dyspnea: Secondary | ICD-10-CM

## 2020-03-17 DIAGNOSIS — R0600 Dyspnea, unspecified: Secondary | ICD-10-CM | POA: Diagnosis not present

## 2020-03-18 LAB — ECHOCARDIOGRAM COMPLETE
Area-P 1/2: 3.42 cm2
S' Lateral: 2.7 cm

## 2020-03-19 DIAGNOSIS — I493 Ventricular premature depolarization: Secondary | ICD-10-CM | POA: Diagnosis not present

## 2020-03-19 DIAGNOSIS — R002 Palpitations: Secondary | ICD-10-CM | POA: Diagnosis not present

## 2020-03-22 ENCOUNTER — Telehealth: Payer: Self-pay | Admitting: *Deleted

## 2020-03-22 ENCOUNTER — Telehealth: Payer: Self-pay | Admitting: Nurse Practitioner

## 2020-03-22 DIAGNOSIS — G4733 Obstructive sleep apnea (adult) (pediatric): Secondary | ICD-10-CM | POA: Diagnosis not present

## 2020-03-22 DIAGNOSIS — I270 Primary pulmonary hypertension: Secondary | ICD-10-CM

## 2020-03-22 DIAGNOSIS — R0609 Other forms of dyspnea: Secondary | ICD-10-CM

## 2020-03-22 MED FILL — AMPHETAMINE SALTS 10 MG: 10 | 30 days supply | Qty: 60 | Fill #0

## 2020-03-22 NOTE — Telephone Encounter (Signed)
Returned pt's call.  She has been made aware of her echo results. Will refer to Pulmonology and pt aware they will reach out to her to schedule.  Pt verbalized understanding.

## 2020-03-22 NOTE — Telephone Encounter (Signed)
Patient notified. She works at [employer] and would like scans done at [employer]

## 2020-03-22 NOTE — Telephone Encounter (Signed)
Called the patient back because I received a paper from the insurance asking if she had taken a med and I faxed back no. I have also attempted the PA on cover my meds for generic adderall 10 mg and the response "Prior Authorization is not required for this medication dosage form and strength at the quantity and days supply requested."  I have printed this and have sent this to the pharmacy advising them so they can attempt to run the medication again. I am unsure why they are advising a Josem Kaufmann is needing.

## 2020-03-22 NOTE — Telephone Encounter (Signed)
Patient is available tomorrow after 3:30.  I spoke with radiology scheduling (phone -[phone]) and authorization if needed must be done prior to scheduling tests. Will check with precert to see if authorization needed.

## 2020-03-22 NOTE — Telephone Encounter (Signed)
I spoke with patient.  She will have scans done tomorrow.

## 2020-03-22 NOTE — Telephone Encounter (Signed)
Burtis Junes, NP  03/19/2020 9:42 AM EDT     Ok to report. Her echo shows that her pumping function is totally normal. Her valves are basically ok. There is a moderate increase in the BP thru her lungs - indicating pulmonary HTN. This may be causing her shortness of breath. Needs referral to Pulmonary - Dr. Valeta Harms, Azucena Kuba, [place] or [place] please.

## 2020-03-22 NOTE — Telephone Encounter (Signed)
Patient states she is returning call in regards to scheduling scans.

## 2020-03-22 NOTE — Telephone Encounter (Signed)
Kathryn Hendricks is returning Kathryn Hendricks's call.

## 2020-03-22 NOTE — Telephone Encounter (Signed)
No authorization needed. Appointment has been scheduled for 8/5.  I spoke with radiology scheduling and it will be OK for patient to contact them to see if earlier time available.  Radiology has availability on Thursday at 2 PM and Friday at 8 AM.  I spoke with patient and gave her this information along with scheduled appointment date of 8/5.  She will contact radiology scheduling to arrange time that is best with her schedule.

## 2020-03-22 NOTE — Telephone Encounter (Signed)
-----   Message from Burtis Junes, NP sent at 03/19/2020  9:42 AM EDT ----- Ok to report. Her echo shows that her pumping function is totally normal. Her valves are basically ok. There is a moderate increase in the BP thru her lungs - indicating pulmonary HTN. This may be causing her shortness of breath. Needs referral to Pulmonary - Dr. Valeta Harms, Azucena Kuba, [place] or [place] please.

## 2020-03-22 NOTE — Telephone Encounter (Signed)
-----   Message from Burtis Junes, NP sent at 03/22/2020  9:41 AM EDT ----- She has referral to pulmonary in place I would also like to get a VQ scan to rule out PE.

## 2020-03-22 NOTE — Telephone Encounter (Signed)
Called the pt to check on status and she was able to get her medication.

## 2020-03-22 NOTE — Telephone Encounter (Signed)
Pt called wanting to know the update on getting her amphetamine-dextroamphetamine (ADDERALL) 10 MG tablet Pt states that she is not able to function at work without it and is continuously tiered. Pt states she almost fell asleep going to work and had to miss work today due to being so sleepy and exhausted. Please advise.

## 2020-03-23 ENCOUNTER — Other Ambulatory Visit: Payer: Self-pay

## 2020-03-23 ENCOUNTER — Ambulatory Visit (HOSPITAL_COMMUNITY)
Admission: RE | Admit: 2020-03-23 | Discharge: 2020-03-23 | Disposition: A | Discharge status: Home / Self Care | Payer: 59 | Source: Ambulatory Visit | Attending: Nurse Practitioner | Admitting: Nurse Practitioner

## 2020-03-23 DIAGNOSIS — R0602 Shortness of breath: Secondary | ICD-10-CM | POA: Diagnosis not present

## 2020-03-23 DIAGNOSIS — R0600 Dyspnea, unspecified: Secondary | ICD-10-CM | POA: Diagnosis not present

## 2020-03-23 DIAGNOSIS — R0609 Other forms of dyspnea: Secondary | ICD-10-CM

## 2020-03-23 MED ORDER — TECHNETIUM TO 99M ALBUMIN AGGREGATED
4.4000 | Freq: Once | INTRAVENOUS | Status: AC | PRN
  Administered 2020-03-23: 4.4 via INTRAVENOUS

## 2020-03-31 ENCOUNTER — Ambulatory Visit (HOSPITAL_COMMUNITY): Payer: 59

## 2020-04-05 ENCOUNTER — Other Ambulatory Visit: Payer: Self-pay | Admitting: Pulmonary Disease

## 2020-04-05 ENCOUNTER — Ambulatory Visit: Payer: 59 | Admitting: Pulmonary Disease

## 2020-04-05 ENCOUNTER — Encounter: Payer: Self-pay | Admitting: Pulmonary Disease

## 2020-04-05 ENCOUNTER — Other Ambulatory Visit: Payer: Self-pay

## 2020-04-05 VITALS — BP 134/86 | HR 63 | Temp 97.1°F | Ht 64.0 in | Wt 146.0 lb

## 2020-04-05 DIAGNOSIS — R0602 Shortness of breath: Secondary | ICD-10-CM

## 2020-04-05 DIAGNOSIS — J310 Chronic rhinitis: Secondary | ICD-10-CM | POA: Diagnosis not present

## 2020-04-05 DIAGNOSIS — G4730 Sleep apnea, unspecified: Secondary | ICD-10-CM

## 2020-04-05 LAB — CBC WITH DIFFERENTIAL/PLATELET
Basophils Absolute: 0.1 10*3/uL (ref 0.0–0.1)
Basophils Relative: 2.8 % (ref 0.0–3.0)
Eosinophils Absolute: 0.2 10*3/uL (ref 0.0–0.7)
Eosinophils Relative: 5 % (ref 0.0–5.0)
HCT: 38.5 % (ref 36.0–46.0)
Hemoglobin: 12.4 g/dL (ref 12.0–15.0)
Lymphocytes Relative: 50.5 % — ABNORMAL HIGH (ref 12.0–46.0)
Lymphs Abs: 2.4 10*3/uL (ref 0.7–4.0)
MCHC: 32.2 g/dL (ref 30.0–36.0)
MCV: 87.7 fl (ref 78.0–100.0)
Monocytes Absolute: 0.4 10*3/uL (ref 0.1–1.0)
Monocytes Relative: 7.4 % (ref 3.0–12.0)
Neutro Abs: 1.6 10*3/uL (ref 1.4–7.7)
Neutrophils Relative %: 34.3 % — ABNORMAL LOW (ref 43.0–77.0)
Platelets: 223 10*3/uL (ref 150.0–400.0)
RBC: 4.39 Mil/uL (ref 3.87–5.11)
RDW: 14.6 % (ref 11.5–15.5)
WBC: 4.8 10*3/uL (ref 4.0–10.5)

## 2020-04-05 MED ORDER — ALBUTEROL SULFATE HFA 108 (90 BASE) MCG/ACT IN AERS: 2.0000 | INHALATION_SPRAY | Freq: Four times a day (QID) | RESPIRATORY_TRACT | 3 refills | Status: DC | PRN

## 2020-04-05 MED ORDER — IPRATROPIUM BROMIDE 0.03 % NA SOLN: 2.0000 | Freq: Four times a day (QID) | NASAL | 3 refills | Status: DC | PRN

## 2020-04-05 MED ORDER — FLUTICASONE PROPIONATE 50 MCG/ACT NA SUSP
1.0000 | Freq: Every day | NASAL | 2 refills | Status: DC
Start: 2020-04-05 — End: 2020-04-05

## 2020-04-05 MED ORDER — IPRATROPIUM BROMIDE 0.03 % NA SOLN: 2.0000 | Freq: Three times a day (TID) | NASAL | Status: DC

## 2020-04-05 MED FILL — IPRATROPIUM 0.03% SPRAY: 0.03 | 22 days supply | Qty: 30 | Fill #0

## 2020-04-05 MED FILL — ALBUTEROL SULFATE HFA 108 (: 108 (90 BAS | 16 days supply | Qty: 18 | Fill #0

## 2020-04-05 MED FILL — FLUTICASONE PROP 50 MCG SPR: 50 | 60 days supply | Qty: 16 | Fill #0

## 2020-04-05 NOTE — Progress Notes (Signed)
Patient ID: Kathryn Hendricks, female    DOB: 1956/02/08, 64 y.o.   MRN: [id number]  Chief Complaint  Patient presents with  . Consult    referred for pulm htn.  Echo 03/17/20.     Referring provider: Burtis Junes, NP  HPI: Kathryn Hendricks is a 64 year old woman, non-smoker with history of narcolepsy, Obstructive sleep apnea, Heart Failure preserved EF, hypertension and hyperlipidemia who is referred to pulmonary clinic for progressive shortness of breath and concern for pulmonary hypertension.   She was recently evaluated by her cardiology team on 02/23/20 where she reported worsening shortness of breath with exertion that is associated with feeling lightheaded, chest pressure and palpitations. 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 She denies fainting or syncope with the shortness of breath. She denies chest pain. She does having wheezing and chest tightness. She reports certain cleaning agents will cause her to feel more short of breath, cough and wheeze. She also reports strong perfumes and colognes will trigger this reaction. The shortness of breath is somewhat relieved by albuterol.   She has CPAP for her obstructive sleep apnea but admits to not using it often at all. She reports she has sinus congestion, post-nasal drip and runny nose that make it uncomfortable to wear CPAP. She does not use any nasal sprays or rinses.   Recent Echo 03/17/20 showed EF 65-70%, normal LV systolic function, normal right ventricle systolic function and an estimated RVSP 41.65mmHg. Normal mitral valve, normal tricuspid valveand mild aortic valve sclerosis.  Recent pulmonary perfusion scan was normal.   No PFTs on file.   Allergies  Allergen Reactions  . Penicillins Shortness Of Breath, Itching and Swelling    Has patient had a PCN reaction causing immediate rash, facial/tongue/throat swelling, SOB or  lightheadedness with hypotension: Yes Has patient had a PCN reaction causing severe rash involving mucus membranes or skin necrosis: No Has patient had a PCN reaction that required hospitalization: No Has patient had a PCN reaction occurring within the last 10 years: No If all of the above answers are "NO", then may proceed with Cephalosporin use.     Immunization History  Administered Date(s) Administered  . Influenza Whole 06/28/2007, 07/27/2008, 04/28/2016  . Influenza,inj,Quad PF,6+ Mos 05/16/2015  . Influenza-Unspecified 05/27/2013, 05/17/2014, 04/27/2016, 06/11/2019  . Moderna SARS-COVID-2 Vaccination 09/22/2019, 10/09/2019  . Td 06/28/2007  . Tdap 06/14/2017  . Zoster Recombinat (Shingrix) 07/14/2019, 10/14/2019    Past Medical History:  Diagnosis Date  . Abdominal pain, left lower quadrant 11/08/2015  . Abnormal Pap smear 2011  . Anxiety   . Arthritis    lower back and shoulders  . Bronchitis    hx - used abluterol inhaler  . Chronic diastolic heart failure ([place])   . Cognitive decline   . FIBROIDS, UTERUS 07/02/2009   Qualifier: Diagnosis of  By: Ta MD, Cat    . GERD (gastroesophageal reflux disease)    occasional  . Hyperlipidemia   . Hypertension   . Left shoulder pain 02/14/2016  . Narcolepsy   . Osteopenia 04/2019   T score -1.7 FRAX 3.4% / 0.2%  . Premature atrial contractions   . PVC's (premature ventricular contractions)   . Seasonal allergies   . SVD (spontaneous vaginal delivery)    x 3    Tobacco History: Social History   Tobacco Use  Smoking Status Never Smoker  Smokeless Tobacco Never  Used   Counseling given: Not Answered   Outpatient Medications Prior to Visit  Medication Sig Dispense Refill  . amphetamine-dextroamphetamine (ADDERALL) 10 MG tablet TAKE 1 TABLET BY MOUTH WITH WATER BEFORE BREAKFAST AND BEFORE LUNCH 60 tablet 0  . ibuprofen (ADVIL,MOTRIN) 200 MG tablet Take 400 mg by mouth every 6 (six) hours as needed for headache or  moderate pain.    Marland Kitchen lisinopril (ZESTRIL) 5 MG tablet TAKE 1 TABLET BY MOUTH ONCE DAILY 90 tablet 1  . simvastatin (ZOCOR) 40 MG tablet Take 1 tablet (40 mg total) by mouth at bedtime. 90 tablet 2  . albuterol (PROVENTIL HFA;VENTOLIN HFA) 108 (90 Base) MCG/ACT inhaler Inhale 2 puffs into the lungs 4 (four) times daily. 1 Inhaler 4   No facility-administered medications prior to visit.     Review of Systems:   Constitutional:   No  weight loss, night sweats,  Fevers, chills, fatigue, or  lassitude.  HEENT:   No headaches,  Difficulty swallowing,  Tooth/dental problems, or  Sore throat,                No sneezing, itching, ear ache, + nasal congestion, +post nasal drip,   CV:  No chest pain,  Orthopnea, PND, swelling in lower extremities, anasarca, syncope.   GI  No heartburn, indigestion, abdominal pain, nausea, vomiting, diarrhea, change in bowel habits, loss of appetite, bloody stools.   Resp: +shortness of breath with exertion.  No excess mucus, no productive cough,  No non-productive cough,  No coughing up of blood.  No change in color of mucus.  No wheezing.  No chest wall deformity  Skin: no rash or lesions.  GU: no dysuria, change in color of urine, no urgency or frequency.  No flank pain, no hematuria   MS:  No joint pain or swelling.  No decreased range of motion.  No back pain.    Physical Exam  BP 134/86 (BP Location: Left Arm, Cuff Size: Normal)   Pulse 63   Temp (!) 97.1 F (36.2 C) (Temporal)   Ht 5\' 4"  (1.626 m)   Wt 146 lb (66.2 kg)   SpO2 99%   BMI 25.06 kg/m   GEN: A/Ox3; pleasant , NAD, well nourished    HEENT:  [place]/AT,  EACs-clear, TMs-wnl, NOSE-erythematous, clear secretions present, THROAT-clear, no lesions, no postnasal drip or exudate noted.   NECK:  Supple w/ fair ROM; no JVD; normal carotid impulses w/o bruits; no thyromegaly or nodules palpated; no lymphadenopathy.    RESP  Clear  P & A; w/o, wheezes/ rales/ or rhonchi. no accessory muscle use,  no dullness to percussion  CARD:  RRR, no m/r/g, no peripheral edema, pulses intact, no cyanosis or clubbing.  GI:   Soft & nt; nml bowel sounds; no organomegaly or masses detected.   Musco: Warm bil, no deformities or joint swelling noted.   Neuro: alert, no focal deficits noted.    Skin: Warm, no lesions or rashes    Lab Results:  CBC    Component Value Date/Time   WBC 4.8 04/05/2020 1155   RBC 4.39 04/05/2020 1155   HGB 12.4 04/05/2020 1155   HGB 12.2 02/23/2020 1123   HCT 38.5 04/05/2020 1155   HCT 38.3 02/23/2020 1123   PLT 223.0 04/05/2020 1155   PLT 215 02/23/2020 1123   MCV 87.7 04/05/2020 1155   MCV 89 02/23/2020 1123   MCH 28.4 02/23/2020 1123   MCH 28.6 02/26/2017 1446   MCHC 32.2 04/05/2020  1155   RDW 14.6 04/05/2020 1155   RDW 13.1 02/23/2020 1123   LYMPHSABS 2.4 04/05/2020 1155   MONOABS 0.4 04/05/2020 1155   EOSABS 0.2 04/05/2020 1155   BASOSABS 0.1 04/05/2020 1155    BMET    Component Value Date/Time   NA 141 02/23/2020 1123   K 4.3 02/23/2020 1123   CL 106 02/23/2020 1123   CO2 27 02/23/2020 1123   GLUCOSE 91 02/23/2020 1123   GLUCOSE 89 02/07/2017 1100   BUN 10 02/23/2020 1123   CREATININE 0.69 02/23/2020 1123   CREATININE 0.68 04/27/2016 1558   CALCIUM 9.2 02/23/2020 1123   GFRNONAA 94 02/23/2020 1123   GFRNONAA >89 09/10/2013 1136   GFRAA 108 02/23/2020 1123   GFRAA >89 09/10/2013 1136    BNP No results found for: BNP  ProBNP    Component Value Date/Time   PROBNP 245.80 (H) 05/07/2013 1413    Imaging: DG Chest 2 View  Result Date: 03/23/2020 CLINICAL DATA:  Shortness of breath. EXAM: CHEST - 2 VIEW COMPARISON:  09/21/2014 FINDINGS: The cardiac silhouette, mediastinal and hilar contours are within normal limits. The lungs are clear. No pleural effusions or pulmonary lesions. The bony thorax is intact. IMPRESSION: No acute cardiopulmonary findings. Electronically Signed   By: Marijo Sanes M.D.   On: 03/23/2020 15:51   NM  Pulmonary Perf and Vent  Result Date: 03/23/2020 CLINICAL DATA:  Shortness of breath. EXAM: NUCLEAR MEDICINE PERFUSION LUNG SCAN TECHNIQUE: Perfusion images were obtained in multiple projections after intravenous injection of radiopharmaceutical. Ventilation scans intentionally deferred if perfusion scan and chest x-ray adequate for interpretation during COVID 19 epidemic. RADIOPHARMACEUTICALS:  4.4 mCi Tc-64m MAA IV COMPARISON:  Chest x-ray, same date. FINDINGS: Normal perfusion lung scan. No segmental or subsegmental fusion defects to suggest pulmonary embolism. IMPRESSION: Normal perfusion lung scan.  No findings for pulmonary embolism. Electronically Signed   By: Marijo Sanes M.D.   On: 03/23/2020 15:55   ECHOCARDIOGRAM COMPLETE  Result Date: 03/18/2020    ECHOCARDIOGRAM REPORT   Patient Name:   TIFFNEY HAUGHTON [facility] Date of Exam: 03/17/2020 Medical Rec #:  [id number]       Height:       64.0 in Accession #:    [id number]      Weight:       146.2 lb Date of Birth:  August 18, 1956       BSA:          1.712 m Patient Age:    44 years        BP:           140/86 mmHg Patient Gender: F               HR:           75 bpm. Exam Location:  [facility] Procedure: 2D Echo, 3D Echo, Cardiac Doppler, Color Doppler and Strain Analysis Indications:     I50.32 CHF  History:         Patient has prior history of Echocardiogram examinations, most                  recent 04/24/2016. Arrythmias:PVC; Risk Factors:Hypertension and                  Dyslipidemia.  Sonographer:     Jessee Avers, RDCS Referring Phys:  Ko Olina C GERHARDT Diagnosing Phys: Fransico Him MD IMPRESSIONS  1. Left ventricular ejection fraction, by estimation, is 65 to 70%. The left  ventricle has normal function. The left ventricle has no regional wall motion abnormalities. Left ventricular diastolic parameters were normal. The average left ventricular global longitudinal strain is -23.0 %. The global longitudinal strain is normal.  2. Right ventricular systolic  function is normal. The right ventricular size is normal. There is moderately elevated pulmonary artery systolic pressure. The estimated right ventricular systolic pressure is 83.3 mmHg.  3. The mitral valve is normal in structure. Trivial mitral valve regurgitation. No evidence of mitral stenosis.  4. The aortic valve is tricuspid. Aortic valve regurgitation is not visualized. Mild aortic valve sclerosis is present, with no evidence of aortic valve stenosis.  5. The inferior vena cava is normal in size with greater than 50% respiratory variability, suggesting right atrial pressure of 3 mmHg. Comparison(s): 04/24/16 EF 55-60%. FINDINGS  Left Ventricle: Left ventricular ejection fraction, by estimation, is 65 to 70%. The left ventricle has normal function. The left ventricle has no regional wall motion abnormalities. The average left ventricular global longitudinal strain is -23.0 %. The global longitudinal strain is normal. The left ventricular internal cavity size was normal in size. There is no left ventricular hypertrophy. Left ventricular diastolic parameters were normal. Normal left ventricular filling pressure. Right Ventricle: The right ventricular size is normal. No increase in right ventricular wall thickness. Right ventricular systolic function is normal. There is moderately elevated pulmonary artery systolic pressure. The tricuspid regurgitant velocity is 2.90 m/s, and with an assumed right atrial pressure of 8 mmHg, the estimated right ventricular systolic pressure is 82.5 mmHg. Left Atrium: Left atrial size was normal in size. Right Atrium: Right atrial size was normal in size. Pericardium: There is no evidence of pericardial effusion. Mitral Valve: The mitral valve is normal in structure. Normal mobility of the mitral valve leaflets. Trivial mitral valve regurgitation. No evidence of mitral valve stenosis. Tricuspid Valve: The tricuspid valve is normal in structure. Tricuspid valve regurgitation is mild  . No evidence of tricuspid stenosis. Aortic Valve: The aortic valve is tricuspid. Aortic valve regurgitation is not visualized. Mild aortic valve sclerosis is present, with no evidence of aortic valve stenosis. Pulmonic Valve: The pulmonic valve was normal in structure. Pulmonic valve regurgitation is not visualized. No evidence of pulmonic stenosis. Aorta: The aortic root is normal in size and structure. Venous: The inferior vena cava is normal in size with greater than 50% respiratory variability, suggesting right atrial pressure of 3 mmHg. IAS/Shunts: No atrial level shunt detected by color flow Doppler.  LEFT VENTRICLE PLAX 2D LVIDd:         4.00 cm  Diastology LVIDs:         2.70 cm  LV e' lateral:   9.30 cm/s LV PW:         0.70 cm  LV E/e' lateral: 11.0 LV IVS:        0.70 cm  LV e' medial:    8.10 cm/s LVOT diam:     1.90 cm  LV E/e' medial:  12.6 LV SV:         92 LV SV Index:   54       2D Longitudinal Strain LVOT Area:     2.84 cm 2D Strain GLS (A2C):   -22.6 %                         2D Strain GLS (A3C):   -21.2 %  2D Strain GLS (A4C):   -25.1 %                         2D Strain GLS Avg:     -23.0 %                          3D Volume EF:                         3D EF:        65 %                         LV EDV:       108 ml                         LV ESV:       38 ml                         LV SV:        70 ml RIGHT VENTRICLE RV Basal diam:  4.00 cm RV S prime:     15.50 cm/s TAPSE (M-mode): 2.5 cm LEFT ATRIUM             Index       RIGHT ATRIUM           Index LA diam:        3.60 cm 2.10 cm/m  RA Area:     12.90 cm LA Vol (A2C):   43.4 ml 25.34 ml/m RA Volume:   29.40 ml  17.17 ml/m LA Vol (A4C):   36.3 ml 21.20 ml/m LA Biplane Vol: 40.2 ml 23.48 ml/m  AORTIC VALVE LVOT Vmax:   145.00 cm/s LVOT Vmean:  97.200 cm/s LVOT VTI:    0.325 m  AORTA Ao Root diam: 2.40 cm Ao Asc diam:  2.80 cm MITRAL VALVE                TRICUSPID VALVE                             TR Peak grad:    33.6 mmHg                             TR Vmax:        290.00 cm/s MV E velocity: 102.00 cm/s MV A velocity: 85.30 cm/s   SHUNTS MV E/A ratio:  1.20         Systemic VTI:  0.32 m                             Systemic Diam: 1.90 cm Fransico Him MD Electronically signed by Fransico Him MD Signature Date/Time: 03/17/2020/12:06:24 PM    Final (Updated)      Assessment & Plan:  Kathryn Hendricks is a 64 year old woman, non-smoker with history of narcolepsy, Obstructive sleep apnea, Heart Failure preserved EF, hypertension and hyperlipidemia who is referred to pulmonary clinic for progressive shortness of breath and concern for pulmonary hypertension.   Her progressive dyspnea appears more related to asthma with occupational triggers such as cleaning agents. She may also be experiencing dyspnea related  to untreated sleep apnea. Her reactive airways disease is also aggravated by her sinus disease/rhininitis.   For the concern of asthma/reactive airways disease we will order a PFT. She is to use her albuterol every 4-6 hours as needed for now. She will likely require a daily inhaler steroid but I would to obtain baseline PFTs prior to therapy.   We will treat her rhinitis with daily fluticasone nasal spray and ipratropium nasal spray as needed. The goal of this therapy is resolve or reduce her nasal secretions so CPAP therapy is more amenable.   The concern for pulmonary hypertension is low at this time, given her RVSP is only mildly elevated. She has otherwise normal heart function on the echo. There is also no link between her adderall medication leading to pulmonary hypertension in the literature.     We will give her a call with the PFT results and she is to follow up in clinic in 3 months.   Freddi Starr, MD 04/11/2020

## 2020-04-05 NOTE — Patient Instructions (Signed)
Start using ipratropium nasal spray every 6 hours as needed for your runny nose Start using fluticasone nasal spray once daily for your runny nose due to allergies Use the albuterol inhaler every 4-6 hours as needed We will check pulmonary function tests and call you with the results. We will determine if further inhaler therapy is needed at that time.

## 2020-04-22 DIAGNOSIS — G4733 Obstructive sleep apnea (adult) (pediatric): Secondary | ICD-10-CM | POA: Diagnosis not present

## 2020-05-09 ENCOUNTER — Other Ambulatory Visit: Payer: Self-pay | Admitting: Family Medicine

## 2020-05-09 MED FILL — SIMVASTATIN 40 MG TABLET: 40 | 90 days supply | Qty: 90 | Fill #0

## 2020-05-12 DIAGNOSIS — K5900 Constipation, unspecified: Secondary | ICD-10-CM | POA: Diagnosis not present

## 2020-05-12 DIAGNOSIS — Z800 Family history of malignant neoplasm of digestive organs: Secondary | ICD-10-CM | POA: Diagnosis not present

## 2020-05-12 DIAGNOSIS — R159 Full incontinence of feces: Secondary | ICD-10-CM | POA: Diagnosis not present

## 2020-05-12 DIAGNOSIS — K219 Gastro-esophageal reflux disease without esophagitis: Secondary | ICD-10-CM | POA: Diagnosis not present

## 2020-05-12 DIAGNOSIS — Z1211 Encounter for screening for malignant neoplasm of colon: Secondary | ICD-10-CM | POA: Diagnosis not present

## 2020-05-12 MED FILL — CLENPIQ 10-3.5-12 MG-GM -GM: 10-3.5-12 M | 1 days supply | Qty: 320 | Fill #0

## 2020-05-13 ENCOUNTER — Other Ambulatory Visit: Payer: Self-pay | Admitting: Family Medicine

## 2020-05-13 ENCOUNTER — Other Ambulatory Visit: Payer: Self-pay | Admitting: Neurology

## 2020-05-13 MED FILL — AMPHETAMINE SALTS 10 MG: 10 | 30 days supply | Qty: 60 | Fill #0

## 2020-05-16 MED FILL — LISINOPRIL 5 MG TABS: 5 | 90 days supply | Qty: 90 | Fill #0

## 2020-05-26 ENCOUNTER — Other Ambulatory Visit (HOSPITAL_COMMUNITY): Payer: Self-pay

## 2020-05-26 MED FILL — PREVIDENT 5000 SENSITIVE PA: 1.1-5 | 30 days supply | Qty: 100 | Fill #0

## 2020-05-31 ENCOUNTER — Ambulatory Visit (INDEPENDENT_AMBULATORY_CARE_PROVIDER_SITE_OTHER): Payer: 59 | Admitting: Family Medicine

## 2020-05-31 ENCOUNTER — Encounter: Payer: Self-pay | Admitting: Family Medicine

## 2020-05-31 ENCOUNTER — Other Ambulatory Visit (HOSPITAL_COMMUNITY): Payer: Self-pay

## 2020-05-31 ENCOUNTER — Other Ambulatory Visit: Payer: Self-pay

## 2020-05-31 VITALS — BP 165/100 | HR 61 | Ht 64.0 in | Wt 145.2 lb

## 2020-05-31 DIAGNOSIS — Z0000 Encounter for general adult medical examination without abnormal findings: Secondary | ICD-10-CM | POA: Diagnosis not present

## 2020-05-31 DIAGNOSIS — Z23 Encounter for immunization: Secondary | ICD-10-CM | POA: Diagnosis not present

## 2020-05-31 DIAGNOSIS — I10 Essential (primary) hypertension: Secondary | ICD-10-CM

## 2020-05-31 MED ORDER — LISINOPRIL 10 MG PO TABS: 10.0000 mg | ORAL_TABLET | Freq: Every day | ORAL | 1 refills | Status: DC

## 2020-05-31 MED FILL — LISINOPRIL 10 MG TABS: 10 | 90 days supply | Qty: 90 | Fill #0

## 2020-05-31 MED FILL — OMRON 3 SERIES BP MONITOR D: 1 days supply | Qty: 1 | Fill #0

## 2020-05-31 NOTE — Patient Instructions (Addendum)
It was nice seeing you today. Your BP is up. Please increase your Lisinopril to 10 mg daily. Take two tablets of your 5 mg Lisinopril and then pick up your new medication of 10 mg Lisinopril from your pharmacy. I will like to see you back in 2 weeks.  Blood Pressure Record Sheet To take your blood pressure, you will need a blood pressure machine. You can buy a blood pressure machine (blood pressure monitor) at your clinic, drug store, or online. When choosing one, consider:  An automatic monitor that has an arm cuff.  A cuff that wraps snugly around your upper arm. You should be able to fit only one finger between your arm and the cuff.  A device that stores blood pressure reading results.  Do not choose a monitor that measures your blood pressure from your wrist or finger. Follow your health care provider's instructions for how to take your blood pressure. To use this form:  Get one reading in the morning (a.m.) before you take any medicines.  Get one reading in the evening (p.m.) before supper.  Take at least 2 readings with each blood pressure check. This makes sure the results are correct. Wait 1-2 minutes between measurements.  Write down the results in the spaces on this form.  Repeat this once a week, or as told by your health care provider.  Make a follow-up appointment with your health care provider to discuss the results. Blood pressure log Date: _______________________  a.m. _____________________(1st reading) _____________________(2nd reading)  p.m. _____________________(1st reading) _____________________(2nd reading) Date: _______________________  a.m. _____________________(1st reading) _____________________(2nd reading)  p.m. _____________________(1st reading) _____________________(2nd reading) Date: _______________________  a.m. _____________________(1st reading) _____________________(2nd reading)  p.m. _____________________(1st reading) _____________________(2nd  reading) Date: _______________________  a.m. _____________________(1st reading) _____________________(2nd reading)  p.m. _____________________(1st reading) _____________________(2nd reading) Date: _______________________  a.m. _____________________(1st reading) _____________________(2nd reading)  p.m. _____________________(1st reading) _____________________(2nd reading) This information is not intended to replace advice given to you by your health care provider. Make sure you discuss any questions you have with your health care provider. Document Revised: 10/11/2017 Document Reviewed: 08/13/2017 Elsevier Patient Education  [place].

## 2020-05-31 NOTE — Assessment & Plan Note (Signed)
BP elevated today with multiple checks. Increased Lisinopril to 10 mg qd. Continue home BP monitoring. F/U in 2 weeks for reassessment.

## 2020-05-31 NOTE — Progress Notes (Signed)
Subjective:     Kathryn Hendricks is a 64 y.o. female and is here for a comprehensive physical exam. The patient reports problems - Anxious and worried about not having enough money to fix her home, otherwise she is doing well.. She had a headache this morning, but has gone now after she took her BP meds. Social History   Socioeconomic History  . Marital status: Divorced    Spouse name: Not on file  . Number of children: 3  . Years of education: 30  . Highest education level: Not on file  Occupational History  . Occupation: Microbiologist: [employer]  Tobacco Use  . Smoking status: Never Smoker  . Smokeless tobacco: Never Used  Vaping Use  . Vaping Use: Never used  Substance and Sexual Activity  . Alcohol use: No    Alcohol/week: 0.0 standard drinks  . Drug use: No  . Sexual activity: Never    Birth control/protection: Post-menopausal, Surgical    Comment: 1st intercourse 51 yo-5 partners  Other Topics Concern  . Not on file  Social History Narrative   Caffeine 1 cup coffee in am.   Social Determinants of Health   Financial Resource Strain:   . Difficulty of Paying Living Expenses: Not on file  Food Insecurity:   . Worried About Charity fundraiser in the Last Year: Not on file  . Ran Out of Food in the Last Year: Not on file  Transportation Needs:   . Lack of Transportation (Medical): Not on file  . Lack of Transportation (Non-Medical): Not on file  Physical Activity:   . Days of Exercise per Week: Not on file  . Minutes of Exercise per Session: Not on file  Stress:   . Feeling of Stress : Not on file  Social Connections:   . Frequency of Communication with Friends and Family: Not on file  . Frequency of Social Gatherings with Friends and Family: Not on file  . Attends Religious Services: Not on file  . Active Member of Clubs or Organizations: Not on file  . Attends Archivist Meetings: Not on file  . Marital Status: Not on file  Intimate  Partner Violence:   . Fear of Current or Ex-Partner: Not on file  . Emotionally Abused: Not on file  . Physically Abused: Not on file  . Sexually Abused: Not on file   Health Maintenance  Topic Date Due  . INFLUENZA VACCINE  03/27/2020  . COLONOSCOPY  04/28/2020  . MAMMOGRAM  02/18/2022  . PAP SMEAR-Modifier  11/11/2024  . TETANUS/TDAP  06/15/2027  . COVID-19 Vaccine  Completed  . Hepatitis C Screening  Completed  . HIV Screening  Completed    The following portions of the patient's history were reviewed and updated as appropriate: allergies, current medications, past family history, past medical history, past social history, past surgical history and problem list.  Review of Systems Pertinent items noted in HPI and remainder of comprehensive ROS otherwise negative.   Objective:   Vitals:   05/31/20 0910 05/31/20 0934 05/31/20 0935 05/31/20 0938  BP: (!) 150/100 (!) 179/100 (!) 159/80 (!) 165/100  Pulse: 61     SpO2: 98%     Weight: 145 lb 3.2 oz (65.9 kg)     Height: 5\' 4"  (1.626 m)        General appearance: alert, cooperative and appears stated age Head: Normocephalic, without obvious abnormality, atraumatic Eyes: conjunctivae/corneas clear. PERRL, EOM's intact. Fundi  benign. Ears: normal TM's and external ear canals both ears Throat: lips, mucosa, and tongue normal; teeth and gums normal Neck: no adenopathy, no carotid bruit, no JVD, supple, symmetrical, trachea midline and thyroid not enlarged, symmetric, no tenderness/mass/nodules Back: symmetric, no curvature. ROM normal. No CVA tenderness. Lungs: clear to auscultation bilaterally Heart: regular rate and rhythm, S1, S2 normal, no murmur, click, rub or gallop Abdomen: soft, non-tender; bowel sounds normal; no masses,  no organomegaly Extremities: extremities normal, atraumatic, no cyanosis or edema Pulses: 2+ and symmetric Skin: Skin color, texture, turgor normal. No rashes or lesions Lymph nodes: Cervical,  supraclavicular, and axillary nodes normal. Neurologic: Alert and oriented X 3, normal strength and tone. Normal symmetric reflexes. Normal coordination and gait    Assessment:    Healthy female exam.     HTN Plan:   Normal well exam except for elevated BP. She took her meds this morning has been compliant. She does not check her BP at home regularly. Increased Lisinopril to 10 mg qd. She will check BP at her work. F/U in 2 weeks for reassessment. For well visit, she got flu shot today. She already got her two doses of COVID-19 shot. She is up to date with her mammogram. She has an appointment already scheduled for colonoscopy. I discussed PAP with her, technically she can d/c PAP screening now. However, she will like to get it done one more time when she is 71 because of high risk of cancer in her family. Otherwise, she is doing well.  Note that she mentioned that her cardiologist diagnosed her with pulmonary HTN. However, upon record review, she has low concern for Pulm HTN according to her cardiologist's report. See After Visit Summary for Counseling Recommendations

## 2020-06-10 DIAGNOSIS — Z800 Family history of malignant neoplasm of digestive organs: Secondary | ICD-10-CM | POA: Diagnosis not present

## 2020-06-10 DIAGNOSIS — D125 Benign neoplasm of sigmoid colon: Secondary | ICD-10-CM | POA: Diagnosis not present

## 2020-06-10 DIAGNOSIS — Z1211 Encounter for screening for malignant neoplasm of colon: Secondary | ICD-10-CM | POA: Diagnosis not present

## 2020-06-10 DIAGNOSIS — K635 Polyp of colon: Secondary | ICD-10-CM | POA: Diagnosis not present

## 2020-07-07 ENCOUNTER — Encounter: Payer: Self-pay | Admitting: Family Medicine

## 2020-07-07 ENCOUNTER — Ambulatory Visit (INDEPENDENT_AMBULATORY_CARE_PROVIDER_SITE_OTHER): Payer: 59 | Admitting: Family Medicine

## 2020-07-07 ENCOUNTER — Other Ambulatory Visit: Payer: Self-pay

## 2020-07-07 DIAGNOSIS — I10 Essential (primary) hypertension: Secondary | ICD-10-CM

## 2020-07-07 NOTE — Progress Notes (Signed)
    SUBJECTIVE:   CHIEF COMPLAINT / HPI: Hypertension follow-up  Patient recently seen on 05/31/2020 and blood pressure was noted to be elevated at 165/100.  Patient current medication include lisinopril 10 mg.  Clarified if patient was still taking Adderall which she states she is still taking for her narcolepsy.   BP rechecked with 149/84 and 140/79 in bilateral upper extremities. Patient reports some increased dizziness since increasing her lisinopril from 5 to 10mg  at her last visit in Oct. She otherwise feels well and does not have any HA or vision changes nor chest pain.   PERTINENT  PMH / PSH:  HTN   OBJECTIVE:   BP 140/80   Pulse 67   Wt 150 lb 3.2 oz (68.1 kg)   SpO2 99%   BMI 25.78 kg/m   PE  General: female appearing stated age in no acute distress Cardio: Normal S1 and S2, no S3 or S4. Rhythm is regular. No murmurs or rubs.  Bilateral radial pulses palpable Pulm: Clear to auscultation bilaterally, no crackles, wheezing, or diminished breath sounds. Normal respiratory effort Extremities: No peripheral edema. Warm/ well perfused. Neuro: pt alert and oriented x4, able to follow commands, EOMI bilateral, no conjunctival injection   ASSESSMENT/PLAN:   HYPERTENSION, BENIGN Patient with improved blood pressure since her last visit. Given dizzy symptoms with increased BP medication, counseled patient that we will hold off on adding any additional agents and see if these symptoms resolve. Would like for blood pressure to be less than 140 but will be mindful of any development of hypotension. Patient also on adderall for narcolepsy that could contribute to elevated BP  - continue lisinopril 10mg  daily  - advised patient to monitor BP and keep journal of hypotensive symptoms prior to follow up visit       Eulis Foster, MD [place]

## 2020-07-07 NOTE — Patient Instructions (Addendum)
It was a pleasure to see you today!  Thank you for choosing [facility] for your primary care.  Kathryn Hendricks was seen for blood pressure follow up.   Our plans for today were:  Please continue to take your lisinopril 10mg  daily. I also recommend keeping a record of your blood pressure, measuring at least once daily until your follow up visit in 1 month.   Since you are experiencing some lightheadedness with the changes in your blood pressure medication, we will not add any additional therapies today.  Great job on being up-to-date with your healthcare maintenance items!   You should return to our clinic in 4 weeks for blood pressure follow up.    Best Wishes,   Dr. Alba Cory    DASH Eating Plan DASH stands for "Dietary Approaches to Stop Hypertension." The DASH eating plan is a healthy eating plan that has been shown to reduce high blood pressure (hypertension). It may also reduce your risk for type 2 diabetes, heart disease, and stroke. The DASH eating plan may also help with weight loss. What are tips for following this plan?  General guidelines  Avoid eating more than 2,300 mg (milligrams) of salt (sodium) a day. If you have hypertension, you may need to reduce your sodium intake to 1,500 mg a day.  Limit alcohol intake to no more than 1 drink a day for nonpregnant women and 2 drinks a day for men. One drink equals 12 oz of beer, 5 oz of wine, or 1 oz of hard liquor.  Work with your health care provider to maintain a healthy body weight or to lose weight. Ask what an ideal weight is for you.  Get at least 30 minutes of exercise that causes your heart to beat faster (aerobic exercise) most days of the week. Activities may include walking, swimming, or biking.  Work with your health care provider or diet and nutrition specialist (dietitian) to adjust your eating plan to your individual calorie needs. Reading food labels   Check food labels for the amount of  sodium per serving. Choose foods with less than 5 percent of the Daily Value of sodium. Generally, foods with less than 300 mg of sodium per serving fit into this eating plan.  To find whole grains, look for the word "whole" as the first word in the ingredient list. Shopping  Buy products labeled as "low-sodium" or "no salt added."  Buy fresh foods. Avoid canned foods and premade or frozen meals. Cooking  Avoid adding salt when cooking. Use salt-free seasonings or herbs instead of table salt or sea salt. Check with your health care provider or pharmacist before using salt substitutes.  Do not fry foods. Cook foods using healthy methods such as baking, boiling, grilling, and broiling instead.  Cook with heart-healthy oils, such as olive, canola, soybean, or sunflower oil. Meal planning  Eat a balanced diet that includes: ? 5 or more servings of fruits and vegetables each day. At each meal, try to fill half of your plate with fruits and vegetables. ? Up to 6-8 servings of whole grains each day. ? Less than 6 oz of lean meat, poultry, or fish each day. A 3-oz serving of meat is about the same size as a deck of cards. One egg equals 1 oz. ? 2 servings of low-fat dairy each day. ? A serving of nuts, seeds, or beans 5 times each week. ? Heart-healthy fats. Healthy fats called Omega-3 fatty acids are found in  foods such as flaxseeds and coldwater fish, like sardines, salmon, and mackerel.  Limit how much you eat of the following: ? Canned or prepackaged foods. ? Food that is high in trans fat, such as fried foods. ? Food that is high in saturated fat, such as fatty meat. ? Sweets, desserts, sugary drinks, and other foods with added sugar. ? Full-fat dairy products.  Do not salt foods before eating.  Try to eat at least 2 vegetarian meals each week.  Eat more home-cooked food and less restaurant, buffet, and fast food.  When eating at a restaurant, ask that your food be prepared with  less salt or no salt, if possible. What foods are recommended? The items listed may not be a complete list. Talk with your dietitian about what dietary choices are best for you. Grains Whole-grain or whole-wheat bread. Whole-grain or whole-wheat pasta. Brown rice. [place] Morrow. Bulgur. Whole-grain and low-sodium cereals. Pita bread. Low-fat, low-sodium crackers. Whole-wheat flour tortillas. Vegetables Fresh or frozen vegetables (raw, steamed, roasted, or grilled). Low-sodium or reduced-sodium tomato and vegetable juice. Low-sodium or reduced-sodium tomato sauce and tomato paste. Low-sodium or reduced-sodium canned vegetables. Fruits All fresh, dried, or frozen fruit. Canned fruit in natural juice (without added sugar). Meat and other protein foods Skinless chicken or [place]. Ground chicken or [place]. Pork with fat trimmed off. Fish and seafood. Egg whites. Dried beans, peas, or lentils. Unsalted nuts, nut butters, and seeds. Unsalted canned beans. Lean cuts of beef with fat trimmed off. Low-sodium, lean deli meat. Dairy Low-fat (1%) or fat-free (skim) milk. Fat-free, low-fat, or reduced-fat cheeses. Nonfat, low-sodium ricotta or cottage cheese. Low-fat or nonfat yogurt. Low-fat, low-sodium cheese. Fats and oils Soft margarine without trans fats. Vegetable oil. Low-fat, reduced-fat, or light mayonnaise and salad dressings (reduced-sodium). Canola, safflower, olive, soybean, and sunflower oils. Avocado. Seasoning and other foods Herbs. Spices. Seasoning mixes without salt. Unsalted popcorn and pretzels. Fat-free sweets. What foods are not recommended? The items listed may not be a complete list. Talk with your dietitian about what dietary choices are best for you. Grains Baked goods made with fat, such as croissants, muffins, or some breads. Dry pasta or rice meal packs. Vegetables Creamed or fried vegetables. Vegetables in a cheese sauce. Regular canned vegetables (not low-sodium or  reduced-sodium). Regular canned tomato sauce and paste (not low-sodium or reduced-sodium). Regular tomato and vegetable juice (not low-sodium or reduced-sodium). [name]. Olives. Fruits Canned fruit in a light or heavy syrup. Fried fruit. Fruit in cream or butter sauce. Meat and other protein foods Fatty cuts of meat. Ribs. Fried meat. Berniece Salines. Sausage. Bologna and other processed lunch meats. Salami. Fatback. Hotdogs. Bratwurst. Salted nuts and seeds. Canned beans with added salt. Canned or smoked fish. Whole eggs or egg yolks. Chicken or [place] with skin. Dairy Whole or 2% milk, cream, and half-and-half. Whole or full-fat cream cheese. Whole-fat or sweetened yogurt. Full-fat cheese. Nondairy creamers. Whipped toppings. Processed cheese and cheese spreads. Fats and oils Butter. Stick margarine. Lard. Shortening. Ghee. Bacon fat. Tropical oils, such as coconut, palm kernel, or palm oil. Seasoning and other foods Salted popcorn and pretzels. Onion salt, garlic salt, seasoned salt, table salt, and sea salt. Worcestershire sauce. Tartar sauce. Barbecue sauce. Teriyaki sauce. Soy sauce, including reduced-sodium. Steak sauce. Canned and packaged gravies. Fish sauce. Oyster sauce. Cocktail sauce. Horseradish that you find on the shelf. Ketchup. Mustard. Meat flavorings and tenderizers. Bouillon cubes. Hot sauce and Tabasco sauce. Premade or packaged marinades. Premade or packaged taco seasonings. Relishes. Regular  salad dressings. Where to find more information:  National Heart, Lung, and [place]: https://wilson-eaton.com/  American Heart Association: www.heart.org Summary  The DASH eating plan is a healthy eating plan that has been shown to reduce high blood pressure (hypertension). It may also reduce your risk for type 2 diabetes, heart disease, and stroke.  With the DASH eating plan, you should limit salt (sodium) intake to 2,300 mg a day. If you have hypertension, you may need to reduce your sodium  intake to 1,500 mg a day.  When on the DASH eating plan, aim to eat more fresh fruits and vegetables, whole grains, lean proteins, low-fat dairy, and heart-healthy fats.  Work with your health care provider or diet and nutrition specialist (dietitian) to adjust your eating plan to your individual calorie needs. This information is not intended to replace advice given to you by your health care provider. Make sure you discuss any questions you have with your health care provider. Document Revised: 07/26/2017 Document Reviewed: 08/06/2016 Elsevier Patient Education  2020 [employer].

## 2020-07-10 NOTE — Assessment & Plan Note (Signed)
Patient with improved blood pressure since her last visit. Given dizzy symptoms with increased BP medication, counseled patient that we will hold off on adding any additional agents and see if these symptoms resolve. Would like for blood pressure to be less than 140 but will be mindful of any development of hypotension. Patient also on adderall for narcolepsy that could contribute to elevated BP  - continue lisinopril 10mg  daily  - advised patient to monitor BP and keep journal of hypotensive symptoms prior to follow up visit

## 2020-07-11 ENCOUNTER — Other Ambulatory Visit: Payer: Self-pay | Admitting: Neurology

## 2020-07-12 ENCOUNTER — Other Ambulatory Visit: Payer: Self-pay

## 2020-07-12 ENCOUNTER — Ambulatory Visit (INDEPENDENT_AMBULATORY_CARE_PROVIDER_SITE_OTHER): Payer: 59 | Admitting: Pulmonary Disease

## 2020-07-12 DIAGNOSIS — R0602 Shortness of breath: Secondary | ICD-10-CM | POA: Diagnosis not present

## 2020-07-12 LAB — PULMONARY FUNCTION TEST
DL/VA % pred: 109 %
DL/VA: 4.59 ml/min/mmHg/L
DLCO cor % pred: 91 %
DLCO cor: 18.29 ml/min/mmHg
DLCO unc % pred: 91 %
DLCO unc: 18.29 ml/min/mmHg
FEF 25-75 Post: 2.18 L/sec
FEF 25-75 Pre: 1.93 L/sec
FEF2575-%Change-Post: 13 %
FEF2575-%Pred-Post: 109 %
FEF2575-%Pred-Pre: 96 %
FEV1-%Change-Post: 2 %
FEV1-%Pred-Post: 108 %
FEV1-%Pred-Pre: 106 %
FEV1-Post: 2.21 L
FEV1-Pre: 2.17 L
FEV1FVC-%Change-Post: 6 %
FEV1FVC-%Pred-Pre: 99 %
FEV6-%Change-Post: -4 %
FEV6-%Pred-Post: 104 %
FEV6-%Pred-Pre: 109 %
FEV6-Post: 2.63 L
FEV6-Pre: 2.75 L
FEV6FVC-%Pred-Post: 103 %
FEV6FVC-%Pred-Pre: 103 %
FVC-%Change-Post: -4 %
FVC-%Pred-Post: 100 %
FVC-%Pred-Pre: 105 %
FVC-Post: 2.63 L
FVC-Pre: 2.75 L
Post FEV1/FVC ratio: 84 %
Post FEV6/FVC ratio: 100 %
Pre FEV1/FVC ratio: 79 %
Pre FEV6/FVC Ratio: 100 %
RV % pred: 85 %
RV: 1.74 L
TLC % pred: 89 %
TLC: 4.53 L

## 2020-07-12 NOTE — Progress Notes (Signed)
Full PFT performed today. °

## 2020-07-14 MED FILL — AMPHETAMINE SALTS 10 MG: 10 | 30 days supply | Qty: 60 | Fill #0

## 2020-07-26 MED FILL — IPRATROPIUM 0.03% SPRAY: 0.03 | 22 days supply | Qty: 30 | Fill #1

## 2020-07-26 MED FILL — FLUTICASONE PROP 50 MCG SPR: 50 | 60 days supply | Qty: 16 | Fill #1

## 2020-08-08 DIAGNOSIS — M7542 Impingement syndrome of left shoulder: Secondary | ICD-10-CM | POA: Diagnosis not present

## 2020-08-18 ENCOUNTER — Other Ambulatory Visit: Payer: Self-pay

## 2020-08-18 ENCOUNTER — Ambulatory Visit (INDEPENDENT_AMBULATORY_CARE_PROVIDER_SITE_OTHER): Payer: 59

## 2020-08-18 DIAGNOSIS — Z23 Encounter for immunization: Secondary | ICD-10-CM | POA: Diagnosis not present

## 2020-08-18 NOTE — Progress Notes (Signed)
   Covid-19 Vaccination Clinic  Name:  Kathryn Hendricks    MRN: [id number] DOB: [birth year]  08/18/2020   Patient presents to nurse clinic for booster COVID vaccine. Previously documented in chart that patient had received 2-dose Moderna series, however, patient reports receiving Pfizer vaccine. Verified with immunization card and [facility]. Updated Epic to reflect patient receiving Pfizer vaccine, not Moderna. Administered in LD, site unremarkable, tolerated injection well.  [name] was observed post Covid-19 immunization for 15 minutes without incident. She was provided with Vaccine Information Sheet and instruction to access the V-Safe system.   [name] was instructed to call 911 with any severe reactions post vaccine: Marland Kitchen Difficulty breathing  . Swelling of face and throat  . A fast heartbeat  . A bad rash all over body  . Dizziness and weakness  Provided patient with updated immunization record and card.   Talbot Grumbling, RN

## 2020-08-23 DIAGNOSIS — H52223 Regular astigmatism, bilateral: Secondary | ICD-10-CM | POA: Diagnosis not present

## 2020-08-23 DIAGNOSIS — H524 Presbyopia: Secondary | ICD-10-CM | POA: Diagnosis not present

## 2020-08-23 DIAGNOSIS — H5203 Hypermetropia, bilateral: Secondary | ICD-10-CM | POA: Diagnosis not present

## 2020-09-06 MED FILL — PREVIDENT 5000 SENSITIVE PA: 1.1-5 | 30 days supply | Qty: 100 | Fill #1

## 2020-09-15 ENCOUNTER — Other Ambulatory Visit: Payer: Self-pay | Admitting: Neurology

## 2020-09-16 ENCOUNTER — Other Ambulatory Visit: Payer: Self-pay | Admitting: Neurology

## 2020-09-16 MED FILL — AMPHETAMINE SALTS 10 MG: 10 | 30 days supply | Qty: 60 | Fill #0

## 2020-09-16 NOTE — Telephone Encounter (Signed)
Last seen 12/10/19

## 2020-09-23 ENCOUNTER — Other Ambulatory Visit (HOSPITAL_COMMUNITY): Payer: Self-pay | Admitting: Orthopedic Surgery

## 2020-09-23 DIAGNOSIS — G8918 Other acute postprocedural pain: Secondary | ICD-10-CM | POA: Diagnosis not present

## 2020-09-23 DIAGNOSIS — M75122 Complete rotator cuff tear or rupture of left shoulder, not specified as traumatic: Secondary | ICD-10-CM | POA: Diagnosis not present

## 2020-09-23 DIAGNOSIS — M7522 Bicipital tendinitis, left shoulder: Secondary | ICD-10-CM | POA: Diagnosis not present

## 2020-09-23 DIAGNOSIS — M19012 Primary osteoarthritis, left shoulder: Secondary | ICD-10-CM | POA: Diagnosis not present

## 2020-09-23 DIAGNOSIS — M659 Synovitis and tenosynovitis, unspecified: Secondary | ICD-10-CM | POA: Diagnosis not present

## 2020-09-23 DIAGNOSIS — M7542 Impingement syndrome of left shoulder: Secondary | ICD-10-CM | POA: Diagnosis not present

## 2020-09-23 DIAGNOSIS — S43432A Superior glenoid labrum lesion of left shoulder, initial encounter: Secondary | ICD-10-CM | POA: Diagnosis not present

## 2020-09-23 MED FILL — OXYCODONE-APAP 5-325MG: 5-325 | 4 days supply | Qty: 20 | Fill #0

## 2020-09-23 MED FILL — CYCLOBENZAPRINE HCL 10 MG T: 10 | 10 days supply | Qty: 30 | Fill #0

## 2020-09-23 MED FILL — NAPROXEN 500 MG TABLET: 500 | 30 days supply | Qty: 60 | Fill #0

## 2020-09-23 MED FILL — ONDANSETRON HCL 4 MG TABLET: 4 | 4 days supply | Qty: 10 | Fill #0

## 2020-09-28 DIAGNOSIS — Z4889 Encounter for other specified surgical aftercare: Secondary | ICD-10-CM | POA: Diagnosis not present

## 2020-09-28 DIAGNOSIS — M25512 Pain in left shoulder: Secondary | ICD-10-CM | POA: Diagnosis not present

## 2020-09-28 DIAGNOSIS — I890 Lymphedema, not elsewhere classified: Secondary | ICD-10-CM | POA: Diagnosis not present

## 2020-09-28 DIAGNOSIS — M7542 Impingement syndrome of left shoulder: Secondary | ICD-10-CM | POA: Diagnosis not present

## 2020-10-04 ENCOUNTER — Other Ambulatory Visit: Payer: Self-pay | Admitting: Family Medicine

## 2020-10-04 MED FILL — LISINOPRIL 10 MG TABS: 10 | 90 days supply | Qty: 90 | Fill #1

## 2020-10-04 MED FILL — SIMVASTATIN 40 MG TABLET: 40 | 90 days supply | Qty: 90 | Fill #0

## 2020-10-05 DIAGNOSIS — M25512 Pain in left shoulder: Secondary | ICD-10-CM | POA: Diagnosis not present

## 2020-10-09 DIAGNOSIS — I890 Lymphedema, not elsewhere classified: Secondary | ICD-10-CM | POA: Diagnosis not present

## 2020-10-09 DIAGNOSIS — M7542 Impingement syndrome of left shoulder: Secondary | ICD-10-CM | POA: Diagnosis not present

## 2020-10-09 DIAGNOSIS — M25512 Pain in left shoulder: Secondary | ICD-10-CM | POA: Diagnosis not present

## 2020-10-09 DIAGNOSIS — Z4889 Encounter for other specified surgical aftercare: Secondary | ICD-10-CM | POA: Diagnosis not present

## 2020-10-12 DIAGNOSIS — M25512 Pain in left shoulder: Secondary | ICD-10-CM | POA: Diagnosis not present

## 2020-10-14 MED FILL — SIMVASTATIN 40 MG TABLET: 40 | 90 days supply | Qty: 90 | Fill #0

## 2020-10-20 DIAGNOSIS — M25512 Pain in left shoulder: Secondary | ICD-10-CM | POA: Diagnosis not present

## 2020-10-27 DIAGNOSIS — M25512 Pain in left shoulder: Secondary | ICD-10-CM | POA: Diagnosis not present

## 2020-11-01 DIAGNOSIS — M25512 Pain in left shoulder: Secondary | ICD-10-CM | POA: Diagnosis not present

## 2020-11-08 DIAGNOSIS — M25512 Pain in left shoulder: Secondary | ICD-10-CM | POA: Diagnosis not present

## 2020-11-24 DIAGNOSIS — M25512 Pain in left shoulder: Secondary | ICD-10-CM | POA: Diagnosis not present

## 2020-11-25 ENCOUNTER — Encounter: Payer: Self-pay | Admitting: Family Medicine

## 2020-11-25 ENCOUNTER — Ambulatory Visit: Payer: 59 | Admitting: Family Medicine

## 2020-11-25 ENCOUNTER — Other Ambulatory Visit: Payer: Self-pay

## 2020-11-25 VITALS — BP 148/88 | HR 71 | Ht 64.0 in | Wt 158.0 lb

## 2020-11-25 DIAGNOSIS — N3281 Overactive bladder: Secondary | ICD-10-CM | POA: Diagnosis not present

## 2020-11-25 DIAGNOSIS — E785 Hyperlipidemia, unspecified: Secondary | ICD-10-CM

## 2020-11-25 DIAGNOSIS — E7800 Pure hypercholesterolemia, unspecified: Secondary | ICD-10-CM

## 2020-11-25 DIAGNOSIS — E663 Overweight: Secondary | ICD-10-CM

## 2020-11-25 DIAGNOSIS — I5032 Chronic diastolic (congestive) heart failure: Secondary | ICD-10-CM

## 2020-11-25 DIAGNOSIS — I10 Essential (primary) hypertension: Secondary | ICD-10-CM | POA: Diagnosis not present

## 2020-11-25 DIAGNOSIS — R32 Unspecified urinary incontinence: Secondary | ICD-10-CM | POA: Diagnosis not present

## 2020-11-25 LAB — POCT UA - MICROSCOPIC ONLY

## 2020-11-25 LAB — POCT URINALYSIS DIP (MANUAL ENTRY)
Bilirubin, UA: NEGATIVE
Glucose, UA: NEGATIVE mg/dL
Ketones, POC UA: NEGATIVE mg/dL
Leukocytes, UA: NEGATIVE
Nitrite, UA: NEGATIVE
Protein Ur, POC: NEGATIVE mg/dL
Spec Grav, UA: >=1.03 — AB (ref 1.010–1.025)
Urobilinogen, UA: 0.2 E.U./dL
pH, UA: 5.5 (ref 5.0–8.0)

## 2020-11-25 NOTE — Assessment & Plan Note (Signed)
FLP checked. 

## 2020-11-25 NOTE — Patient Instructions (Addendum)
Overactive Bladder, Adult  Overactive bladder is a condition in which a person has a sudden and frequent need to urinate. A person might also leak urine if he or she cannot get to the bathroom fast enough (urinary incontinence). Sometimes, symptoms can interfere with work or social activities. What are the causes? Overactive bladder is associated with poor nerve signals between your bladder and your brain. Your bladder may get the signal to empty before it is full. You may also have very sensitive muscles that make your bladder squeeze too soon. This condition may also be caused by other factors, such as:  Medical conditions: ? Urinary tract infection. ? Infection of nearby tissues. ? Prostate enlargement. ? Bladder stones, inflammation, or tumors. ? Diabetes. ? Muscle or nerve weakness, especially from these conditions:  A spinal cord injury.  Stroke.  Multiple sclerosis.  Parkinson's disease.  Other causes: ? Surgery on the uterus or urethra. ? Drinking too much caffeine or alcohol. ? Certain medicines, especially those that eliminate extra fluid in the body (diuretics). ? Constipation. What increases the risk? You may be at greater risk for overactive bladder if you:  Are an older adult.  Smoke.  Are going through menopause.  Have prostate problems.  Have a neurological disease, such as stroke, dementia, Parkinson's disease, or multiple sclerosis (MS).  Eat or drink alcohol, spicy food, caffeine, and other things that irritate the bladder.  Are overweight or obese. What are the signs or symptoms? Symptoms of this condition include a sudden, strong urge to urinate. Other symptoms include:  Leaking urine.  Urinating 8 or more times a day.  Waking up to urinate 2 or more times overnight. How is this diagnosed? This condition may be diagnosed based on:  Your symptoms and medical history.  A physical exam.  Blood or urine tests to check for possible causes,  such as infection. You may also need to see a health care provider who specializes in urinary tract problems. This is called a urologist. How is this treated? Treatment for overactive bladder depends on the cause of your condition and whether it is mild or severe. Treatment may include:  Bladder training, such as: ? Learning to control the urge to urinate by following a schedule to urinate at regular intervals. ? Doing Kegel exercises to strengthen the pelvic floor muscles that support your bladder.  Special devices, such as: ? Biofeedback. This uses sensors to help you become aware of your body's signals. ? Electrical stimulation. This uses electrodes placed inside the body (implanted) or outside the body. These electrodes send gentle pulses of electricity to strengthen the nerves or muscles that control the bladder. ? Women may use a plastic device, called a pessary, that fits into the vagina and supports the bladder.  Medicines, such as: ? Antibiotics to treat bladder infection. ? Antispasmodics to stop the bladder from releasing urine at the wrong time. ? Tricyclic antidepressants to relax bladder muscles. ? Injections of botulinum toxin type A directly into the bladder tissue to relax bladder muscles.  Surgery, such as: ? A device may be implanted to help manage the nerve signals that control urination. ? An electrode may be implanted to stimulate electrical signals in the bladder. ? A procedure may be done to change the shape of the bladder. This is done only in very severe cases. Follow these instructions at home: Eating and drinking  Make diet or lifestyle changes recommended by your health care provider. These may include: ? Drinking fluids   throughout the day and not only with meals. ? Cutting down on caffeine or alcohol. ? Eating a healthy and balanced diet to prevent constipation. This may include:  Choosing foods that are high in fiber, such as beans, whole grains, and  fresh fruits and vegetables.  Limiting foods that are high in fat and processed sugars, such as fried and sweet foods.   Lifestyle  Lose weight if needed.  Do not use any products that contain nicotine or tobacco. These include cigarettes, chewing tobacco, and vaping devices, such as e-cigarettes. If you need help quitting, ask your health care provider.   General instructions  Take over-the-counter and prescription medicines only as told by your health care provider.  If you were prescribed an antibiotic medicine, take it as told by your health care provider. Do not stop taking the antibiotic even if you start to feel better.  Use any implants or pessary as told by your health care provider.  If needed, wear pads to absorb urine leakage.  Keep a log to track how much and when you drink, and when you need to urinate. This will help your health care provider monitor your condition.  Keep all follow-up visits. This is important. Contact a health care provider if:  You have a fever or chills.  Your symptoms do not get better with treatment.  Your pain and discomfort get worse.  You have more frequent urges to urinate. Get help right away if:  You are not able to control your bladder. Summary  Overactive bladder refers to a condition in which a person has a sudden and frequent need to urinate.  Several conditions may lead to an overactive bladder.  Treatment for overactive bladder depends on the cause and severity of your condition.  Making lifestyle changes, doing Kegel exercises, keeping a log, and taking medicines can help with this condition. This information is not intended to replace advice given to you by your health care provider. Make sure you discuss any questions you have with your health care provider. Document Revised: 05/02/2020 Document Reviewed: 05/02/2020 Elsevier Patient Education  2021 Elsevier Inc.  

## 2020-11-25 NOTE — Assessment & Plan Note (Signed)
Recent ECHO with normal contractility and no diastolic dysfunction. +Pulm HTN I reviewed her cardiologist's note and she was referred to pulm. She is fine otherwise. I will take this diagnosis off her list

## 2020-11-25 NOTE — Progress Notes (Signed)
    SUBJECTIVE:   CHIEF COMPLAINT / HPI:  Urinary Tract Infection  Chronicity: Unable to control her bladder. Episode onset: Started months ago with recent aggravation. The problem occurs intermittently. The problem has been gradually worsening. Quality: No pain. The pain is at a severity of 0/10. There has been no fever. She is not sexually active. There is no history of pyelonephritis. Associated symptoms include frequency and urgency. Pertinent negatives include no discharge, flank pain, hesitancy or nausea. Associated symptoms comments: Patient stated when she has to go, she need to run to the bathroom and most of the time, she does not make it to the bathroom before she wet herself.. She has tried nothing for the symptoms. There is no history of recurrent UTIs.   HTN/HLD: Compliant with her meds, here for f/u.  Weight: Gained few pounds since she had her shoulder surgery, since she is yet to return to work and does not get a lot of exercise. She also enjoys high sugar diet.  Hx of CHF: No respiratory or cardiac symptoms.  HM: Had colonoscopy done last year. Dr. Ernst Hendricks  PERTINENT  PMH / [place]: PMX reviewed  OBJECTIVE:   Vitals:   11/25/20 0953 11/25/20 1033  BP: (!) 150/74 (!) 148/88  Pulse: 71   SpO2: 93%   Weight: 158 lb (71.7 kg)   Height: 5\' 4"  (1.626 m)     Physical Exam Vitals and nursing note reviewed.  Cardiovascular:     Rate and Rhythm: Normal rate and regular rhythm.     Heart sounds: Normal heart sounds. No murmur heard.   Pulmonary:     Effort: No respiratory distress.     Breath sounds: Normal breath sounds. No stridor. No wheezing.  Abdominal:     General: Bowel sounds are normal. There is no distension.     Palpations: Abdomen is soft. There is no mass.     Tenderness: There is no abdominal tenderness.  Musculoskeletal:     Right lower leg: No edema.     Left lower leg: No edema.  Neurological:     Mental Status: She is alert.      ASSESSMENT/PLAN:    Overactive bladder I discussed Myrbetric. However, given that she is already on Adderall, which could interact with Myrbetric, I will hold off on meds for now. UA showed moderate blood with 1-2 RBC - reassuring Repeat UA in 1-2 weeks. Referred to urology for OAB. F/U as needed.  HYPERTENSION, BENIGN BP improved. Monitor closely on Lisinopril. Bmet checked today.  HYPERCHOLESTEROLEMIA FLP checked.  Overweight Diet and exercise counseling done.  Chronic diastolic heart failure (HCC) Recent ECHO with normal contractility and no diastolic dysfunction. +Pulm HTN I reviewed her cardiologist's note and she was referred to pulm. She is fine otherwise. I will take this diagnosis off her list   She signed a ROM form for colonoscopy report  Kathryn Mews, MD [place]

## 2020-11-25 NOTE — Assessment & Plan Note (Signed)
BP improved. Monitor closely on Lisinopril. Bmet checked today.

## 2020-11-25 NOTE — Assessment & Plan Note (Signed)
Diet and exercise counseling done.

## 2020-11-25 NOTE — Assessment & Plan Note (Signed)
I discussed Myrbetric. However, given that she is already on Adderall, which could interact with Myrbetric, I will hold off on meds for now. UA showed moderate blood with 1-2 RBC - reassuring Repeat UA in 1-2 weeks. Referred to urology for OAB. F/U as needed.

## 2020-11-26 ENCOUNTER — Encounter: Payer: Self-pay | Admitting: Family Medicine

## 2020-11-26 LAB — BASIC METABOLIC PANEL
BUN/Creatinine Ratio: 15 (ref 12–28)
BUN: 11 mg/dL (ref 8–27)
CO2: 24 mmol/L (ref 20–29)
Calcium: 9.4 mg/dL (ref 8.7–10.3)
Chloride: 104 mmol/L (ref 96–106)
Creatinine, Ser: 0.71 mg/dL (ref 0.57–1.00)
Glucose: 86 mg/dL (ref 65–99)
Potassium: 4.2 mmol/L (ref 3.5–5.2)
Sodium: 144 mmol/L (ref 134–144)
eGFR: 95 mL/min/{1.73_m2} (ref >59)

## 2020-11-26 LAB — LIPID PANEL
Chol/HDL Ratio: 4.2 ratio (ref 0.0–4.4)
Cholesterol, Total: 243 mg/dL — ABNORMAL HIGH (ref 100–199)
HDL: 58 mg/dL (ref >39)
LDL Chol Calc (NIH): 164 mg/dL — ABNORMAL HIGH (ref 0–99)
Triglycerides: 118 mg/dL (ref 0–149)
VLDL Cholesterol Cal: 21 mg/dL (ref 5–40)

## 2020-11-28 ENCOUNTER — Other Ambulatory Visit (HOSPITAL_COMMUNITY): Payer: Self-pay

## 2020-11-28 ENCOUNTER — Telehealth: Payer: Self-pay | Admitting: Family Medicine

## 2020-11-28 MED ORDER — ROSUVASTATIN CALCIUM 10 MG PO TABS
20.0000 mg | ORAL_TABLET | Freq: Every day | ORAL | 1 refills | Status: DC
Start: 2020-11-28 — End: 2020-11-28
  Filled 2020-11-28: qty 90, 45d supply, fill #0

## 2020-11-28 MED ORDER — ROSUVASTATIN CALCIUM 10 MG PO TABS
10.0000 mg | ORAL_TABLET | Freq: Every day | ORAL | 1 refills | Status: DC
Start: 2020-11-28 — End: 2021-07-06
  Filled 2020-11-28: qty 90, 90d supply, fill #0
  Filled 2021-03-21: qty 90, 90d supply, fill #1

## 2020-11-28 NOTE — Telephone Encounter (Signed)
Poorly controlled LDL/Chol.  She missed couple of her Simvastatin dose in the past week, but otherwise compliant.  I discussed switching from Simvastatin to Crestor.  She agreed with the plan.  I called her pharma to d/c all Simvastatin refills.

## 2020-11-29 DIAGNOSIS — M25512 Pain in left shoulder: Secondary | ICD-10-CM | POA: Diagnosis not present

## 2020-12-05 ENCOUNTER — Other Ambulatory Visit (HOSPITAL_COMMUNITY): Payer: Self-pay

## 2020-12-06 ENCOUNTER — Ambulatory Visit (INDEPENDENT_AMBULATORY_CARE_PROVIDER_SITE_OTHER): Payer: 59 | Admitting: Family Medicine

## 2020-12-06 ENCOUNTER — Encounter: Payer: Self-pay | Admitting: Family Medicine

## 2020-12-06 ENCOUNTER — Other Ambulatory Visit (HOSPITAL_COMMUNITY): Payer: Self-pay

## 2020-12-06 ENCOUNTER — Other Ambulatory Visit: Payer: Self-pay

## 2020-12-06 VITALS — BP 163/83 | HR 76 | Ht 64.0 in | Wt 158.6 lb

## 2020-12-06 DIAGNOSIS — I10 Essential (primary) hypertension: Secondary | ICD-10-CM | POA: Diagnosis not present

## 2020-12-06 DIAGNOSIS — R3981 Functional urinary incontinence: Secondary | ICD-10-CM

## 2020-12-06 DIAGNOSIS — R319 Hematuria, unspecified: Secondary | ICD-10-CM | POA: Diagnosis not present

## 2020-12-06 DIAGNOSIS — R32 Unspecified urinary incontinence: Secondary | ICD-10-CM | POA: Insufficient documentation

## 2020-12-06 LAB — POCT URINALYSIS DIP (MANUAL ENTRY)
Bilirubin, UA: NEGATIVE
Glucose, UA: NEGATIVE mg/dL
Ketones, POC UA: NEGATIVE mg/dL
Leukocytes, UA: NEGATIVE
Nitrite, UA: NEGATIVE
Protein Ur, POC: NEGATIVE mg/dL
Spec Grav, UA: 1.01 (ref 1.010–1.025)
Urobilinogen, UA: 0.2 E.U./dL
pH, UA: 6.5 (ref 5.0–8.0)

## 2020-12-06 LAB — POCT UA - MICROSCOPIC ONLY

## 2020-12-06 MED ORDER — TRAMADOL HCL 50 MG PO TABS
50.0000 mg | ORAL_TABLET | Freq: Four times a day (QID) | ORAL | 0 refills | Status: DC | PRN
  Filled 2020-12-06 – 2020-12-20 (×2): qty 20, 5d supply, fill #0

## 2020-12-06 MED ORDER — LISINOPRIL 20 MG PO TABS
20.0000 mg | ORAL_TABLET | Freq: Every day | ORAL | 1 refills | Status: DC
  Filled 2020-12-06 – 2020-12-20 (×2): qty 90, 90d supply, fill #0
  Filled 2021-04-03: qty 90, 90d supply, fill #1

## 2020-12-06 NOTE — Assessment & Plan Note (Signed)
Her BP remains elevated, despite multiple checks during this visit. I increased her Lisinopril to 20 mg qd. Med escribed to her pharmacy. F/U in 4 weeks for reassessment. Continue home BP monitoring.

## 2020-12-06 NOTE — Progress Notes (Signed)
    SUBJECTIVE:   CHIEF COMPLAINT / HPI:   HTN: She is compliant with Lisinopril 10 mg QD. BP remains elevated. She is here for f/u.  Urine symptoms: Improved incontinence in the past few days. She has been wearing depends diaper. Denies any other urinary symptoms. UA showed + blood during her last visit, here for f/u.   PERTINENT  PMH / PSH: PMX  OBJECTIVE:   Vitals:   12/06/20 1002 12/06/20 1017  BP: (!) 152/93 (!) 163/83  Pulse: 71 76  SpO2: 91% 97%  Weight: 158 lb 9.6 oz (71.9 kg)   Height: 5\' 4"  (1.626 m)    Physical Exam Vitals and nursing note reviewed.  Cardiovascular:     Rate and Rhythm: Normal rate and regular rhythm.     Heart sounds: Normal heart sounds. No murmur heard.   Pulmonary:     Effort: Pulmonary effort is normal. No respiratory distress.     Breath sounds: Normal breath sounds. No wheezing.  Abdominal:     General: Abdomen is flat. Bowel sounds are normal. There is no distension.     Palpations: Abdomen is soft. There is no mass.     Tenderness: There is no abdominal tenderness.  Musculoskeletal:     Right lower leg: No edema.     Left lower leg: No edema.      ASSESSMENT/PLAN:   HYPERTENSION, BENIGN Her BP remains elevated, despite multiple checks during this visit. I increased her Lisinopril to 20 mg qd. Med escribed to her pharmacy. F/U in 4 weeks for reassessment. Continue home BP monitoring.   Urine incontinence Stable while using Depends diaper. No urinary symptoms. Already has urology appointment. UA during last visit showed moderate blood with non-concerning microscopic exam. Repeat UA today shows trace blood with neg RBC on microscopic exam. Again, reassuring. I called with her UA result post visit, as she was able to give urine at the end of the visit. F/U urologist as planned.       Andrena Mews, MD [place]

## 2020-12-06 NOTE — Patient Instructions (Signed)
It was nice seeing you today. Your BP remains elevated. Let us increase your Lisinopril to 20 mg daily. You can take 2 of your 10 mg tablets for now till you pick up the new prescription. Please see me back in 4 week.

## 2020-12-06 NOTE — Assessment & Plan Note (Signed)
Stable while using Depends diaper. No urinary symptoms. Already has urology appointment. UA during last visit showed moderate blood with non-concerning microscopic exam. Repeat UA today shows trace blood with neg RBC on microscopic exam. Again, reassuring. I called with her UA result post visit, as she was able to give urine at the end of the visit. F/U urologist as planned.

## 2020-12-07 DIAGNOSIS — M25512 Pain in left shoulder: Secondary | ICD-10-CM | POA: Diagnosis not present

## 2020-12-16 ENCOUNTER — Other Ambulatory Visit (HOSPITAL_COMMUNITY): Payer: Self-pay

## 2020-12-16 DIAGNOSIS — M25512 Pain in left shoulder: Secondary | ICD-10-CM | POA: Diagnosis not present

## 2020-12-20 ENCOUNTER — Other Ambulatory Visit: Payer: Self-pay | Admitting: Neurology

## 2020-12-20 ENCOUNTER — Other Ambulatory Visit (HOSPITAL_COMMUNITY): Payer: Self-pay

## 2020-12-20 ENCOUNTER — Other Ambulatory Visit: Payer: Self-pay | Admitting: Pulmonary Disease

## 2020-12-20 MED ORDER — ALBUTEROL SULFATE HFA 108 (90 BASE) MCG/ACT IN AERS
2.0000 | INHALATION_SPRAY | Freq: Four times a day (QID) | RESPIRATORY_TRACT | 1 refills | Status: DC | PRN
  Filled 2020-12-20: qty 18, 16d supply, fill #0

## 2020-12-21 ENCOUNTER — Other Ambulatory Visit (HOSPITAL_COMMUNITY): Payer: Self-pay

## 2020-12-21 ENCOUNTER — Other Ambulatory Visit: Payer: Self-pay | Admitting: Neurology

## 2020-12-21 NOTE — Telephone Encounter (Signed)
Last seen 12/10/2019. Last refilled on 09/16/20 per [place] registry. Patient needs a new appointment for further refills.  Refill x1 sent to Dr. Brett Fairy to [facility].

## 2020-12-23 ENCOUNTER — Other Ambulatory Visit (HOSPITAL_COMMUNITY): Payer: Self-pay

## 2020-12-23 DIAGNOSIS — M25512 Pain in left shoulder: Secondary | ICD-10-CM | POA: Diagnosis not present

## 2020-12-26 ENCOUNTER — Other Ambulatory Visit: Payer: Self-pay | Admitting: Neurology

## 2020-12-26 ENCOUNTER — Other Ambulatory Visit (HOSPITAL_COMMUNITY): Payer: Self-pay

## 2020-12-26 ENCOUNTER — Telehealth: Payer: Self-pay | Admitting: Neurology

## 2020-12-26 MED ORDER — AMPHETAMINE-DEXTROAMPHETAMINE 10 MG PO TABS
10.0000 mg | ORAL_TABLET | Freq: Two times a day (BID) | ORAL | 0 refills | Status: DC
  Filled 2020-12-26 – 2021-01-11 (×2): qty 60, 30d supply, fill #0

## 2020-12-26 NOTE — Telephone Encounter (Signed)
Pt. Came into the lobby today and made an appt for a f/u visit so she can get her medication adderall refilled but pt will run out before her appt in July. Best call back is [phone].

## 2020-12-26 NOTE — Telephone Encounter (Signed)
I have routed the RX refill request to Dr Felecia Shelling in Dr Dohmeier's absence. [place] registry verified

## 2020-12-27 ENCOUNTER — Other Ambulatory Visit (HOSPITAL_COMMUNITY): Payer: Self-pay

## 2021-01-04 DIAGNOSIS — N390 Urinary tract infection, site not specified: Secondary | ICD-10-CM | POA: Diagnosis not present

## 2021-01-04 DIAGNOSIS — R351 Nocturia: Secondary | ICD-10-CM | POA: Diagnosis not present

## 2021-01-04 DIAGNOSIS — B951 Streptococcus, group B, as the cause of diseases classified elsewhere: Secondary | ICD-10-CM | POA: Diagnosis not present

## 2021-01-04 DIAGNOSIS — N3941 Urge incontinence: Secondary | ICD-10-CM | POA: Diagnosis not present

## 2021-01-04 DIAGNOSIS — R350 Frequency of micturition: Secondary | ICD-10-CM | POA: Diagnosis not present

## 2021-01-04 DIAGNOSIS — R3121 Asymptomatic microscopic hematuria: Secondary | ICD-10-CM | POA: Diagnosis not present

## 2021-01-05 ENCOUNTER — Other Ambulatory Visit (HOSPITAL_COMMUNITY): Payer: Self-pay

## 2021-01-05 DIAGNOSIS — M25512 Pain in left shoulder: Secondary | ICD-10-CM | POA: Diagnosis not present

## 2021-01-06 ENCOUNTER — Ambulatory Visit: Payer: 59 | Admitting: Family Medicine

## 2021-01-11 ENCOUNTER — Other Ambulatory Visit (HOSPITAL_COMMUNITY): Payer: Self-pay

## 2021-01-11 MED FILL — Sodium Fluoride-Potassium Nitrate Gel 1.1-5%: DENTAL | 30 days supply | Qty: 100 | Fill #0 | Status: AC

## 2021-01-12 ENCOUNTER — Other Ambulatory Visit (HOSPITAL_COMMUNITY): Payer: Self-pay

## 2021-01-17 ENCOUNTER — Ambulatory Visit: Payer: 59 | Admitting: Family Medicine

## 2021-01-17 ENCOUNTER — Other Ambulatory Visit: Payer: Self-pay | Admitting: Family Medicine

## 2021-01-17 DIAGNOSIS — Z1231 Encounter for screening mammogram for malignant neoplasm of breast: Secondary | ICD-10-CM

## 2021-01-18 DIAGNOSIS — K3189 Other diseases of stomach and duodenum: Secondary | ICD-10-CM | POA: Diagnosis not present

## 2021-01-18 DIAGNOSIS — I700 Atherosclerosis of aorta: Secondary | ICD-10-CM | POA: Diagnosis not present

## 2021-01-18 DIAGNOSIS — R3129 Other microscopic hematuria: Secondary | ICD-10-CM | POA: Diagnosis not present

## 2021-01-18 DIAGNOSIS — R3121 Asymptomatic microscopic hematuria: Secondary | ICD-10-CM | POA: Diagnosis not present

## 2021-01-18 DIAGNOSIS — K449 Diaphragmatic hernia without obstruction or gangrene: Secondary | ICD-10-CM | POA: Diagnosis not present

## 2021-01-19 ENCOUNTER — Ambulatory Visit: Payer: 59 | Admitting: Family Medicine

## 2021-01-19 ENCOUNTER — Other Ambulatory Visit: Payer: Self-pay

## 2021-01-19 ENCOUNTER — Encounter: Payer: Self-pay | Admitting: Family Medicine

## 2021-01-19 VITALS — BP 120/68 | HR 84 | Ht 64.0 in | Wt 160.5 lb

## 2021-01-19 DIAGNOSIS — E7800 Pure hypercholesterolemia, unspecified: Secondary | ICD-10-CM

## 2021-01-19 DIAGNOSIS — R635 Abnormal weight gain: Secondary | ICD-10-CM | POA: Insufficient documentation

## 2021-01-19 DIAGNOSIS — I10 Essential (primary) hypertension: Secondary | ICD-10-CM | POA: Diagnosis not present

## 2021-01-19 HISTORY — DX: Abnormal weight gain: R63.5

## 2021-01-19 NOTE — Patient Instructions (Signed)
Thank you for coming in to see [place] today! Please see below to review our plan for today's visit:  1. Continue your Lisinopril 20mg  daily.  2. Return in July 2022 to repeat Lipid Panel/Cholesterol.   Please call the clinic at [phone] if your symptoms worsen or you have any concerns. It was our pleasure to serve you!   Dr. Milus Banister [facility]    [email or web address]">  DASH Eating Plan DASH stands for Dietary Approaches to Stop Hypertension. The DASH eating plan is a healthy eating plan that has been shown to:  Reduce high blood pressure (hypertension).  Reduce your risk for type 2 diabetes, heart disease, and stroke.  Help with weight loss. What are tips for following this plan? Reading food labels  Check food labels for the amount of salt (sodium) per serving. Choose foods with less than 5 percent of the Daily Value of sodium. Generally, foods with less than 300 milligrams (mg) of sodium per serving fit into this eating plan.  To find whole grains, look for the word "whole" as the first word in the ingredient list. Shopping  Buy products labeled as "low-sodium" or "no salt added."  Buy fresh foods. Avoid canned foods and pre-made or frozen meals. Cooking  Avoid adding salt when cooking. Use salt-free seasonings or herbs instead of table salt or sea salt. Check with your health care provider or pharmacist before using salt substitutes.  Do not fry foods. Cook foods using healthy methods such as baking, boiling, grilling, roasting, and broiling instead.  Cook with heart-healthy oils, such as olive, canola, avocado, soybean, or sunflower oil. Meal planning  Eat a balanced diet that includes: ? 4 or more servings of fruits and 4 or more servings of vegetables each day. Try to fill one-half of your plate with fruits and vegetables. ? 6-8 servings of whole grains each day. ? Less than 6 oz (170 g) of  lean meat, poultry, or fish each day. A 3-oz (85-g) serving of meat is about the same size as a deck of cards. One egg equals 1 oz (28 g). ? 2-3 servings of low-fat dairy each day. One serving is 1 cup (237 mL). ? 1 serving of nuts, seeds, or beans 5 times each week. ? 2-3 servings of heart-healthy fats. Healthy fats called omega-3 fatty acids are found in foods such as walnuts, flaxseeds, fortified milks, and eggs. These fats are also found in cold-water fish, such as sardines, salmon, and mackerel.  Limit how much you eat of: ? Canned or prepackaged foods. ? Food that is high in trans fat, such as some fried foods. ? Food that is high in saturated fat, such as fatty meat. ? Desserts and other sweets, sugary drinks, and other foods with added sugar. ? Full-fat dairy products.  Do not salt foods before eating.  Do not eat more than 4 egg yolks a week.  Try to eat at least 2 vegetarian meals a week.  Eat more home-cooked food and less restaurant, buffet, and fast food.   Lifestyle  When eating at a restaurant, ask that your food be prepared with less salt or no salt, if possible.  If you drink alcohol: ? Limit how much you use to:  0-1 drink a day for women who are not pregnant.  0-2 drinks a day for men. ? Be aware of how much alcohol is in your drink. In the [place] one drink equals one 12 oz bottle of beer (355  mL), one 5 oz glass of wine (148 mL), or one 1 oz glass of hard liquor (44 mL). General information  Avoid eating more than 2,300 mg of salt a day. If you have hypertension, you may need to reduce your sodium intake to 1,500 mg a day.  Work with your health care provider to maintain a healthy body weight or to lose weight. Ask what an ideal weight is for you.  Get at least 30 minutes of exercise that causes your heart to beat faster (aerobic exercise) most days of the week. Activities may include walking, swimming, or biking.  Work with your health care provider or  dietitian to adjust your eating plan to your individual calorie needs. What foods should I eat? Fruits All fresh, dried, or frozen fruit. Canned fruit in natural juice (without added sugar). Vegetables Fresh or frozen vegetables (raw, steamed, roasted, or grilled). Low-sodium or reduced-sodium tomato and vegetable juice. Low-sodium or reduced-sodium tomato sauce and tomato paste. Low-sodium or reduced-sodium canned vegetables. Grains Whole-grain or whole-wheat bread. Whole-grain or whole-wheat pasta. Brown rice. [place]. Bulgur. Whole-grain and low-sodium cereals. Pita bread. Low-fat, low-sodium crackers. Whole-wheat flour tortillas. Meats and other proteins Skinless chicken or [place]. Ground chicken or [place]. Pork with fat trimmed off. Fish and seafood. Egg whites. Dried beans, peas, or lentils. Unsalted nuts, nut butters, and seeds. Unsalted canned beans. Lean cuts of beef with fat trimmed off. Low-sodium, lean precooked or cured meat, such as sausages or meat loaves. Dairy Low-fat (1%) or fat-free (skim) milk. Reduced-fat, low-fat, or fat-free cheeses. Nonfat, low-sodium ricotta or cottage cheese. Low-fat or nonfat yogurt. Low-fat, low-sodium cheese. Fats and oils Soft margarine without trans fats. Vegetable oil. Reduced-fat, low-fat, or light mayonnaise and salad dressings (reduced-sodium). Canola, safflower, olive, avocado, soybean, and sunflower oils. Avocado. Seasonings and condiments Herbs. Spices. Seasoning mixes without salt. Other foods Unsalted popcorn and pretzels. Fat-free sweets. The items listed above may not be a complete list of foods and beverages you can eat. Contact a dietitian for more information. What foods should I avoid? Fruits Canned fruit in a light or heavy syrup. Fried fruit. Fruit in cream or butter sauce. Vegetables Creamed or fried vegetables. Vegetables in a cheese sauce. Regular canned vegetables (not low-sodium or reduced-sodium). Regular canned  tomato sauce and paste (not low-sodium or reduced-sodium). Regular tomato and vegetable juice (not low-sodium or reduced-sodium). [name]. Olives. Grains Baked goods made with fat, such as croissants, muffins, or some breads. Dry pasta or rice meal packs. Meats and other proteins Fatty cuts of meat. Ribs. Fried meat. Berniece Salines. Bologna, salami, and other precooked or cured meats, such as sausages or meat loaves. Fat from the back of a pig (fatback). Bratwurst. Salted nuts and seeds. Canned beans with added salt. Canned or smoked fish. Whole eggs or egg yolks. Chicken or [place] with skin. Dairy Whole or 2% milk, cream, and half-and-half. Whole or full-fat cream cheese. Whole-fat or sweetened yogurt. Full-fat cheese. Nondairy creamers. Whipped toppings. Processed cheese and cheese spreads. Fats and oils Butter. Stick margarine. Lard. Shortening. Ghee. Bacon fat. Tropical oils, such as coconut, palm kernel, or palm oil. Seasonings and condiments Onion salt, garlic salt, seasoned salt, table salt, and sea salt. Worcestershire sauce. Tartar sauce. Barbecue sauce. Teriyaki sauce. Soy sauce, including reduced-sodium. Steak sauce. Canned and packaged gravies. Fish sauce. Oyster sauce. Cocktail sauce. Store-bought horseradish. Ketchup. Mustard. Meat flavorings and tenderizers. Bouillon cubes. Hot sauces. Pre-made or packaged marinades. Pre-made or packaged taco seasonings. Relishes. Regular salad dressings. Other foods  Salted popcorn and pretzels. The items listed above may not be a complete list of foods and beverages you should avoid. Contact a dietitian for more information. Where to find more information  National Heart, Lung, and Blood Institute: https://wilson-eaton.com/  American Heart Association: www.heart.org  Academy of Nutrition and Dietetics: www.eatright.[place]: www.kidney.org Summary  The DASH eating plan is a healthy eating plan that has been shown to reduce high blood  pressure (hypertension). It may also reduce your risk for type 2 diabetes, heart disease, and stroke.  When on the DASH eating plan, aim to eat more fresh fruits and vegetables, whole grains, lean proteins, low-fat dairy, and heart-healthy fats.  With the DASH eating plan, you should limit salt (sodium) intake to 2,300 mg a day. If you have hypertension, you may need to reduce your sodium intake to 1,500 mg a day.  Work with your health care provider or dietitian to adjust your eating plan to your individual calorie needs. This information is not intended to replace advice given to you by your health care provider. Make sure you discuss any questions you have with your health care provider. Document Revised: 07/17/2019 Document Reviewed: 07/17/2019 Elsevier Patient Education  2021 [employer].

## 2021-01-19 NOTE — Assessment & Plan Note (Signed)
Blood pressure today 120/60 on lisinopril 20. -Continue lisinopril 20 mg nightly

## 2021-01-19 NOTE — Assessment & Plan Note (Signed)
ASCVD risk 8.1%. -Continue Crestor 10 mg daily -Return July 2022 for repeat lipid panel

## 2021-01-19 NOTE — Assessment & Plan Note (Signed)
Patient with unintentional weight gain, has gained 2 pounds since previous visit in April 2022. -Patient provided with information regarding DASH diet and additional counseling for weight loss approaches with her diet -Was encouraged to walk daily, goal 150 minutes of exercise weekly

## 2021-01-19 NOTE — Progress Notes (Signed)
    SUBJECTIVE:   CHIEF COMPLAINT / HPI:   HTN: BP today 120/68. Patient currently taking Lisinopril 20mg  daily. No symptoms today.   HLD: last lipid panel with cholesterol elevated at 243, LDL elevated at 164. Currently taking Crestor 10mg  every night.  Patient due for repeat lipid panel in July 2022.  Patient's ASCVD risk 8.1%  Weight gain: Patient reports that she has been gaining weight, per chart review appears that she has gained 2 pounds since previous visit in April.  She is asking for information/resources regarding weight loss approaches.   PERTINENT  PMH / PSH:  Patient Active Problem List   Diagnosis Date Noted  . Weight gain 01/19/2021  . Urine incontinence 12/06/2020  . Overactive bladder 11/25/2020  . Osteopenia 11/12/2019  . Paresthesia 06/30/2019  . Narcolepsy and cataplexy 11/05/2017  . Hypersomnia, persistent 11/05/2017  . S/P total hysterectomy 02/19/2017  . PVC (premature ventricular contraction) 12/04/2016  . Supraspinatus tendon tear, left, sequela 07/10/2016  . Biceps tendinitis on left 07/11/2015  . Mild cognitive impairment with memory loss 11/09/2014  . ADD (attention deficit hyperactivity disorder, inattentive type) 11/09/2014  . HYPERCHOLESTEROLEMIA 07/13/2009  . Overweight 07/13/2009  . HYPERTENSION, BENIGN 09/25/2007     OBJECTIVE:   BP 120/68   Pulse 84   Ht 5\' 4"  (1.626 m)   Wt 160 lb 8 oz (72.8 kg)   SpO2 99%   BMI 27.55 kg/m    Physical exam: General: well appearing, no apparent distress Respiratory: CTA bilaterally Cardio: RRR   ASSESSMENT/PLAN:   HYPERTENSION, BENIGN Blood pressure today 120/60 on lisinopril 20. -Continue lisinopril 20 mg nightly  HYPERCHOLESTEROLEMIA ASCVD risk 8.1%. -Continue Crestor 10 mg daily -Return July 2022 for repeat lipid panel  Weight gain Patient with unintentional weight gain, has gained 2 pounds since previous visit in April 2022. -Patient provided with information regarding DASH diet  and additional counseling for weight loss approaches with her diet -Was encouraged to walk daily, goal 150 minutes of exercise weekly     [place]

## 2021-01-25 DIAGNOSIS — M25512 Pain in left shoulder: Secondary | ICD-10-CM | POA: Diagnosis not present

## 2021-01-30 ENCOUNTER — Encounter: Payer: Self-pay | Admitting: Family Medicine

## 2021-02-01 ENCOUNTER — Ambulatory Visit: Payer: 59 | Admitting: Obstetrics & Gynecology

## 2021-02-09 ENCOUNTER — Other Ambulatory Visit (HOSPITAL_COMMUNITY): Payer: Self-pay

## 2021-02-09 MED ORDER — MIRABEGRON ER 50 MG PO TB24
50.0000 mg | ORAL_TABLET | Freq: Every day | ORAL | 11 refills | Status: DC
  Filled 2021-02-09: qty 30, 30d supply, fill #0
  Filled 2021-03-21: qty 30, 30d supply, fill #1
  Filled 2021-04-21: qty 30, 30d supply, fill #2
  Filled 2021-06-21: qty 30, 30d supply, fill #3
  Filled 2021-08-30 (×2): qty 30, 30d supply, fill #4

## 2021-02-10 DIAGNOSIS — N3946 Mixed incontinence: Secondary | ICD-10-CM | POA: Diagnosis not present

## 2021-02-10 DIAGNOSIS — R3121 Asymptomatic microscopic hematuria: Secondary | ICD-10-CM | POA: Diagnosis not present

## 2021-02-21 ENCOUNTER — Telehealth: Payer: Self-pay | Admitting: Orthopedic Surgery

## 2021-02-21 DIAGNOSIS — Z4789 Encounter for other orthopedic aftercare: Secondary | ICD-10-CM | POA: Diagnosis not present

## 2021-02-21 DIAGNOSIS — M25512 Pain in left shoulder: Secondary | ICD-10-CM | POA: Diagnosis not present

## 2021-02-21 NOTE — Telephone Encounter (Signed)
Records refaxed to [facility] [phone]

## 2021-03-08 ENCOUNTER — Encounter: Payer: Self-pay | Admitting: Neurology

## 2021-03-08 ENCOUNTER — Other Ambulatory Visit (HOSPITAL_COMMUNITY): Payer: Self-pay

## 2021-03-08 ENCOUNTER — Ambulatory Visit: Payer: 59 | Admitting: Neurology

## 2021-03-08 VITALS — BP 136/74 | HR 73 | Ht 64.0 in | Wt 163.0 lb

## 2021-03-08 DIAGNOSIS — M25512 Pain in left shoulder: Secondary | ICD-10-CM

## 2021-03-08 DIAGNOSIS — G8929 Other chronic pain: Secondary | ICD-10-CM

## 2021-03-08 DIAGNOSIS — G4719 Other hypersomnia: Secondary | ICD-10-CM | POA: Diagnosis not present

## 2021-03-08 DIAGNOSIS — R41840 Attention and concentration deficit: Secondary | ICD-10-CM | POA: Diagnosis not present

## 2021-03-08 DIAGNOSIS — G4710 Hypersomnia, unspecified: Secondary | ICD-10-CM | POA: Diagnosis not present

## 2021-03-08 MED ORDER — AMPHETAMINE-DEXTROAMPHETAMINE 10 MG PO TABS
10.0000 mg | ORAL_TABLET | Freq: Two times a day (BID) | ORAL | 0 refills | Status: DC
  Filled 2021-03-08: qty 60, 30d supply, fill #0

## 2021-03-08 MED ORDER — MODAFINIL 200 MG PO TABS
200.0000 mg | ORAL_TABLET | Freq: Every day | ORAL | 5 refills | Status: DC
  Filled 2021-03-08 – 2021-03-21 (×2): qty 30, 30d supply, fill #0

## 2021-03-08 NOTE — Patient Instructions (Signed)
Modafinil tablets What is this medication? MODAFINIL (moe DAF i nil) is used to treat excessive sleepiness caused by certain sleep disorders. This includes narcolepsy, sleep apnea, and shift worksleep disorder. This medicine may be used for other purposes; ask your health care provider orpharmacist if you have questions. COMMON BRAND NAME(S): Provigil What should I tell my care team before I take this medication? They need to know if you have any of these conditions: history of depression, mania, or other mental disorder kidney disease liver disease an unusual or allergic reaction to modafinil, other medicines, foods, dyes, or preservatives pregnant or trying to get pregnant breast-feeding How should I use this medication? Take this medicine by mouth with a glass of water. Follow the directions on the prescription label. Take your doses at regular intervals. Do not take your medicine more often than directed. Do not stop taking except on your doctor'sadvice. A special MedGuide will be given to you by the pharmacist with eachprescription and refill. Be sure to read this information carefully each time. Talk to your pediatrician regarding the use of this medicine in children. Thismedicine is not approved for use in children. Overdosage: If you think you have taken too much of this medicine contact apoison control center or emergency room at once. NOTE: This medicine is only for you. Do not share this medicine with others. What if I miss a dose? If you miss a dose, take it as soon as you can. If it is almost time for yournext dose, take only that dose. Do not take double or extra doses. What may interact with this medication? Do not take this medicine with any of the following medications: amphetamine or dextroamphetamine dexmethylphenidate or methylphenidate medicines called MAO Inhibitors like Nardil, Parnate, Marplan, Eldepryl pemoline procarbazine This medicine may also interact with the  following medications: antifungal medicines like itraconazole or ketoconazole barbiturates like phenobarbital birth control pills or other hormone-containing birth control devices or implants carbamazepine cyclosporine diazepam medicines for depression, anxiety, or psychotic disturbances phenytoin propranolol triazolam warfarin This list may not describe all possible interactions. Give your health care provider a list of all the medicines, herbs, non-prescription drugs, or dietary supplements you use. Also tell them if you smoke, drink alcohol, or use illegaldrugs. Some items may interact with your medicine. What should I watch for while using this medication? Visit your doctor or health care provider for regular checks on your progress.The full effects of this medicine may not be seen right away. This medicine may cause serious skin reactions. They can happen weeks to months after starting the medicine. Contact your health care provider right away if you notice fevers or flu-like symptoms with a rash. The rash may be red or purple and then turn into blisters or peeling of the skin. Or, you might notice a red rash with swelling of the face, lips or lymph nodes in your neck or underyour arms. This medicine may affect your concentration, function, or may hide signs that you are tired. You may get dizzy. Do not drive, use machinery, or do anything that needs mental alertness until you know how this drug affects you. Alcohol can make you more dizzy and may interfere with your response to this medicineor your alertness. Avoid alcoholic drinks. Birth control pills may not work properly while you are taking this medicine.Talk to your doctor about using an extra method of birth control. It is unknown if the effects of this medicine will be increased by the use of caffeine. Caffeine  is available in many foods, beverages, and medications. Ask your doctor if you should limit or change your intake of  caffeine-containingproducts while on this medicine. What side effects may I notice from receiving this medication? Side effects that you should report to your doctor or health care professionalas soon as possible: allergic reactions like skin rash, itching or hives, swelling of the face, lips, or tongue anxiety breathing problems chest pain fast, irregular heartbeat hallucinations increased blood pressure rash, fever, and swollen lymph nodes redness, blistering, peeling or loosening of the skin, including inside the mouth sore throat, fever, or chills suicidal thoughts or other mood changes tremors vomiting Side effects that usually do not require medical attention (report to yourdoctor or health care professional if they continue or are bothersome): headache nausea, diarrhea, or stomach upset nervousness trouble sleeping This list may not describe all possible side effects. Call your doctor for medical advice about side effects. You may report side effects to FDA at1-800-FDA-1088. Where should I keep my medication? Keep out of the reach of children. This medicine can be abused. Keep your medicine in a safe place to protect it from theft. Do not share this medicine with anyone. Selling or giving away this medicine is dangerous and against thelaw. This medicine may cause accidental overdose and death if taken by other adults, children, or pets. Mix any unused medicine with a substance like cat litter or coffee grounds. Then throw the medicine away in a sealed container like a sealed bag or a coffee can with a lid. Do not use the medicine after theexpiration date. Store at room temperature between 20 and 25 degrees C (68 and 77 degrees F). NOTE: This sheet is a summary. It may not cover all possible information. If you have questions about this medicine, talk to your doctor, pharmacist, orhealth care provider.  2022 Elsevier/Gold Standard (2018-11-19 10:08:08) Hypersomnia Hypersomnia is a  condition in which a person feels very tired during the day even though he or she gets plenty of sleep at night. A person with this condition may take naps during the day and may find it very difficult to wake up from sleep. Hypersomnia may affect a person's ability to think, concentrate,drive, or remember things. What are the causes? The cause of this condition may not be known. Possible causes include: Certain medicines. Sleep disorders, such as narcolepsy and sleep apnea. Injury to the head, brain, or spinal cord. Drug or alcohol use. Gastroesophageal reflux disease (GERD). Tumors. Certain medical conditions, such as depression, diabetes, or an underactive thyroid gland (hypothyroidism). What are the signs or symptoms? The main symptoms of hypersomnia include: Feeling very tired throughout the day, regardless of how much sleep you got the night before. Having trouble waking up. Others may find it difficult to wake you up when you are sleeping. Sleeping for longer and longer periods at a time. Taking naps throughout the day. Other symptoms may include: Feeling restless, anxious, or annoyed. Lacking energy. Having trouble with: Remembering. Speaking. Thinking. Loss of appetite. Seeing, hearing, tasting, smelling, or feeling things that are not real (hallucinations). How is this diagnosed? This condition may be diagnosed based on: Your symptoms and medical history. Your sleeping habits. Your health care provider may ask you to write down your sleeping habits in a daily sleep log, along with any symptoms you have. A series of tests that are done while you sleep (sleep study or polysomnogram). A test that measures how quickly you can fall asleep during the day (daytime  nap study or multiple sleep latency test). How is this treated? Treatment can help you manage your condition. Treatment may include: Following a regular sleep routine. Lifestyle changes, such as changing your eating  habits, getting regular exercise, and avoiding alcohol or caffeinated beverages. Taking medicines to make you more alert (stimulants) during the day. Treating any underlying medical causes of hypersomnia. Follow these instructions at home: Sleep routine  Schedule the same bedtime and wake-up time each day. Practice a relaxing bedtime routine. This may include reading, meditation, deep breathing, or taking a warm bath before going to sleep. Get regular exercise each day. Avoid strenuous exercise in the evening hours. Keep your sleep environment at a cooler temperature, darkened, and quiet. Sleep with pillows and a mattress that are comfortable and supportive. Schedule short 20-minute naps for when you feel sleepiest during the day. Talk with your employer or teachers about your hypersomnia. If possible, adjust your schedule so that: You have a regular daytime work schedule. You can take a scheduled nap during the day. You do not have to work or be active at night. Do not eat a heavy meal for a few hours before bedtime. Eat your meals at about the same times every day. Avoid drinking alcohol or caffeinated beverages.  Safety  Do not drive or use heavy machinery if you are sleepy. Ask your health care provider if it is safe for you to drive. Wear a life jacket when swimming or spending time near water.  General instructions Take supplements and over-the-counter and prescription medicines only as told by your health care provider. Keep a sleep log that will help your doctor manage your condition. This may include information about: What time you go to bed each night. How often you wake up at night. How many hours you sleep at night. How often and for how long you nap during the day. Any observations from others, such as leg movements during sleep, sleep walking, or snoring. Keep all follow-up visits as told by your health care provider. This is important. Contact a health care provider  if: You have new symptoms. Your symptoms get worse. Get help right away if: You have serious thoughts about hurting yourself or someone else. If you ever feel like you may hurt yourself or others, or have thoughts about taking your own life, get help right away. You can go to your nearest emergency department or call: Your local emergency services (911 in the [place] A suicide crisis helpline, such as the [place] at (817) 719-4579. This is open 24 hours a day. Summary Hypersomnia refers to a condition in which you feel very tired during the day even though you get plenty of sleep at night. A person with this condition may take naps during the day and may find it very difficult to wake up from sleep. Hypersomnia may affect a person's ability to think, concentrate, drive, or remember things. Treatment, such as following a regular sleep routine and making some lifestyle changes, can help you manage your condition. This information is not intended to replace advice given to you by your health care provider. Make sure you discuss any questions you have with your healthcare provider. Document Revised: 06/23/2020 Document Reviewed: 06/23/2020 Elsevier Patient Education  2022 [place] With Attention Deficit Hyperactivity Disorder If you have been diagnosed with attention deficit hyperactivity disorder (ADHD), you may be relieved that you now know why you have felt or behaved a certain way. Still, you may feel overwhelmed  about the treatment ahead. You may also wonder how to get the support you need and how to deal with the condition day-to-day. With treatment and support, you can live with ADHD and manage yoursymptoms. How to manage lifestyle changes Managing stress Stress is your body's reaction to life changes and events, both good and bad. To cope with the stress of an ADHD diagnosis, it may help to: Learn more about ADHD. Exercise regularly. Even a short  daily walk can lower stress levels. Participate in training or education programs (including social skills training classes) that teach you to deal with symptoms.  Medicines Your health care provider may suggest certain medicines if he or she feels that they will help to improve your condition. Stimulant medicines are usually prescribed to treat ADHD, and therapy may also be prescribed. It is important to: Avoid using alcohol and other substances that may prevent your medicines from working properly (may interact). Talk with your pharmacist or health care provider about all the medicines that you take, their possible side effects, and what medicines are safe to take together. Make it your goal to take part in all treatment decisions (shared decision-making). Ask about possible side effects of medicines that your health care provider recommends, and tell him or her how you feel about having those side effects. It is best if shared decision-making with your health care provider is part of your total treatment plan. Relationships To strengthen your relationships with family members while treating your condition, consider taking part in family therapy. You might also attendself-help groups alone or with a loved one. Be honest about how your symptoms affect your relationships. Make an effort to communicate respectfully instead of fighting, and find ways to show others that you care. Psychotherapy may be useful in helping you cope with how ADHD affectsyour relationships. How to recognize changes in your condition The following signs may mean that your treatment is working well and your condition is improving: Consistently being on time for appointments. Being more organized at home and work. Other people noticing improvements in your behavior. Achieving goals that you set for yourself. Thinking more clearly. The following signs may mean that your treatment is not working very well: Feeling impatience or  more confusion. Missing, forgetting, or being late for appointments. An increasing sense of disorganization and messiness. More difficulty in reaching goals that you set for yourself. Loved ones becoming angry or frustrated with you. Follow these instructions at home: Take over-the-counter and prescription medicines only as told by your health care provider. Check with your health care provider before taking any new medicines. Create structure and an organized atmosphere at home. For example: Make a list of tasks, then rank them from most important to least important. Work on one task at a time until your listed tasks are done. Make a daily schedule and follow it consistently every day. Use an appointment calendar, and check it 2 or 3 times a day to keep on track. Keep it with you when you leave the house. Create spaces where you keep certain things, and always put things back in their places after you use them. Keep all follow-up visits as told by your health care provider. This is important. Where to find support Talking to others  Keep emotion out of important discussions and speak in a calm, logical way. Listen closely and patiently to your loved ones. Try to understand their point of view, and try to avoid getting defensive. Take responsibility for the consequences of  your actions. Ask that others do not take your behaviors personally. Aim to solve problems as they come up, and express your feelings instead of bottling them up. Talk openly about what you need from your loved ones and how they can support you. Consider going to family therapy sessions or having your family meet with a specialist who deals with ADHD-related behavior problems.  Finances Not all insurance plans cover mental health care, so it is important to check with your insurance carrier. If paying for co-pays or counseling services is a problem, search for a local or county mental health care center. Public mental  health care services may be offered there at a low cost or no cost when you arenot able to see a private health care provider. If you are taking medicine for ADHD, you may be able to get the generic form, which may be less expensive than brand-name medicine. Some makers of prescription medicines also offer help to patients who cannot afford themedicines that they need. Questions to ask your health care provider: What are the risks and benefits of taking medicines? Would I benefit from therapy? How often should I follow up with a health care provider? Contact a health care provider if: You have side effects from your medicines, such as: Repeated muscle twitches, coughing, or speech outbursts. Sleep problems. Loss of appetite. Depression. New or worsening behavior problems. Dizziness. Unusually fast heartbeat. Stomach pains. Headaches. Get help right away if: You have a severe reaction to a medicine. Your behavior suddenly gets worse. Summary With treatment and support, you can live with ADHD and manage your symptoms. The medicines that are most often prescribed for ADHD are stimulants. Consider taking part in family therapy or self-help groups with family members or friends. When you talk with friends and family about your ADHD, be patient and communicate openly. Take over-the-counter and prescription medicines only as told by your health care provider. Check with your health care provider before taking any new medicines. This information is not intended to replace advice given to you by your health care provider. Make sure you discuss any questions you have with your healthcare provider. Document Revised: 01/27/2020 Document Reviewed: 01/27/2020 Elsevier Patient Education  2022 [employer].

## 2021-03-08 NOTE — Progress Notes (Signed)
NEUROLOGY SLEEP CLINIC   PATIENT: Kathryn Hendricks DOB: 1955/12/20  REASON FOR VISIT: follow up- narcolepsy, mild memory impairment, ADD   PCP Dr. Gwendlyn Deutscher .  HISTORY OF PRESENT ILLNESS: Narcolepsy - EDS, ADD, possible learning disability.    RV : 03-08-2021:  Kathryn Hendricks reports that she has difficulties with her rotator cuff and biceps tendon she had a surgical repair but has not healed as she would like.  So she has been out of work for the last 6 months.  Her Epworth Sleepiness Scale has remained highly elevated at 17 out of 24 points and her explanation was that she cannot use her CPAP reliably.  Apparently it feels to her as if there is much too much pressure suddenly imposed on her.  So she has not been using it recently and it is waking her up.  The DME most telling her to bring in her CPAP for sleep for adjustments.  The patient reports a degree of sleepiness that may not all be up to obstructive sleep apnea but to her underlying condition of persistent hypersomnia.  The patient was diagnosed in November 2020 with sleep apnea it was a very mild sleep apnea with an AHI of 7.3/h the REM AHI was 19.4 and her sleep appeared fragmented snoring was present but oxygen saturation was stable throughout the night.  I have ordered an auto titration CPAP between 5 and 12 cmH2O with 1 cm EPR and we had discussed alternatives I wonder if she may want to try a dental device given that her apnea is REM dependent.    Given her very high Epworth sleepiness scores and the difficulties with CPAP use it may be worse while reducing her AHI is somewhat rather than not treating it at all.    12-10-2019: My pleasure of seeing Kathryn Hendricks , a 65 year old female patient with a history of narcolepsy cataplexy who in 2016 had reported a feeling of cognitive decline, last time I saw her she had complained of waking up with headaches , sometimes feeling that her brain is foggy and she has a low monthly income  that has kept her from obtaining a CPAP therapy.  She did test positive for obstructive sleep apnea however.  We may be able to furnish her with a refurbished CPAP machine.   She underwent a sleep study in November 2020 her apnea index was rather low at 7.3/h in rem sleep however she had an apnea index of 19.4 which makes her a CPAP candidate.  She did not have protracted and prolonged oxygen desaturation and she had rather bradycardic heart rates with a minimum of 47 bpm and a maximum of 85.  She slept mostly in supine position but her highest apnea index was when she slept on her left.  It appears that she is doing best if she is on her right side.   In addition she continues to endorse the Epworth sleepiness score at 20 and the fatigue severity score at 56 points both very highly elevated ratings.   Dr. Leta Baptist did a nerve conduction study with her in November 2020 needle examination of the left upper extremity was normal but she had bilateral mild median neuropathies consistent with carpal tunnel. She may treat this just with a soft brace - no surgery needed.  She is having surgery for rotator cuffs left over right symptoms there- she is left side dominant.    Rv 06-16-2019, patient referred for a question  of numbness in the left hand middle-fingers and in toes of the left foot, sometimes having cramps. Not RLS.    Her Memory test was postponed during Covid, need to do this today.  She has been EDS again, can't find RITALIN of much help- shall try Sunosi.  Marland KitchenEpworth is today 15/24 points, GDS 1/ 15 points.   HISTORY: 05-08-2018, Kathryn Hendricks is a longtime patient of mine, she is meanwhile 65 years of age and she has ongoing difficulties with excessive daytime sleepiness.  She reports that she sometimes has the irresistible urge to go to sleep just for a minute or 2 and these naps may only last a couple of minutes maximal 15 but are needed for her to face the next 2 hours of the day.  She needs  these naps to recharge and short intervals.  Unfortunately this interferes with her social life and she has been found falling asleep in front of the grocery store when the rest of her family expected her to come in.  She has been treated with Adderall which has helped somewhat with sleepiness but has not had the long-lasting effect.  She has now developed trouble to sleep at night however- and we need to revisit the stimulant medication. Her daughter is very concerned. Epworth Sleepiness score endorsed at 20-24 points, FSS at 57/63 points, she reports not feeling depressed, not having new medical problems or medication changes.   11-05-2017.I have the pleasure to meet today with Kathryn Hendricks on 05 November 2017.  Kathryn Hendricks is a 65 year old African-American right-handed female with a diagnosis of narcolepsy, possibly cataplexy, and a concern of mild memory impairment.  We suspect that the patient had ADD which affected her ability to perform well in school and she may have a learning disability in addition.  She is treated currently with Adderall as a stimulant to keep her from falling asleep but this is not always working or not working long enough to allow her to participate in a full day's work.  It also leaves her still with a very high degree of daytime sleepiness, unsafe to operate machinery or drive.  Today's Epworth score was endorsed at 20 points out of 24 possible points, she has not yet taken medication for this day.  Her fatigue severity was endorsed at 52 points/ 63 possible points. 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  She also mentioned that she had just a 7th grandchild , 3 month ago.   Today 03/05/17, Kathryn Hendricks is a 65 year old female with a history of narcolepsy and mild memory impairment in ADD. She returns today for follow-up. She reports that she continues to take Adderall 10 mg daily. She does feel that it may not be as  effective as it was when she initially started the medication. She says later in the day she is very sleepy. She reports that she does recently had a hysterectomy. She states since then she has noticed increased mood swings. However she does report that she was having some mood swings before the surgery. She does note that she is very forgetful. She currently lives at home alone. She is able to complete all ADLs. She does operate a motor vehicle and occasionally will get lost but is able to correct this. She returns today for an evaluation  HISTORY 06-19-2016 . Kathryn Hendricks is a 65 year old  Afro-american single female with a history of narcolepsy, mild  memory impairment and ADD.  There is a suspicion of a life long learning disability . She lost her sister in late October 2016. She is currently taking Adderall 10 mg daily- works fairly well. 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 She states that usually by the end of her shift she is exhausted. She states that sometimes when she goes home if she tries to nap she will sleep for hours. She is able to complete all ADLs independently. She will undergo a hysterectomy within the next couple of months, she recently saw her cardiologist and was told that her cardiac function is fine, according to her. Fatigue severity is still very high for this patient, a 59 points, the Epworth sleepiness score is still endorsed at 18 points, this makes driving unsafe for her.   04/27/15 (Anayah Arvanitis): AVANGELINA FLIGHT is a 65 y.o. female patient seen here as are visit from Dr. Gwendlyn Deutscher .The patient was last seen on 01-19-15 .BRYLEIGH OTTAWAY is a 65 y.o. female  seen here as a referral  from Dr. Gwendlyn Deutscher for treatment of recently diagnosed Narcolepsy and evaluation of memory concerns. Mrs. Yearwood reports that she had increasing difficulties with sleepiness and felt increasingly fatigued,  she was not aware that she had physically exerted herself.  Her primary care physician at the time was  [name] and he referred her for a sleep study in 2014.  She underwent  PSG testing  on 06-09-13 at the  [facility], resulting in a very normal PSG , but with a sleep latency of 3 minutes , a REM latency  of  67.5 minutes.   Her sleep efficiency was 85.3% and her total sleep time 331 minutes. There were no significant apneas or hypopneas noted, and  her AHI was 3.8.  There were no periodic limb movements noted.  Study was deemed valid for an MSLT to follow.   The MSLT documented  5 naps with a mean sleep latency of 0.6 minutes and two naps with sleep REM onsets.   This test is diagnostic for the condition of narcolepsy , the MSLT took place on 08-05-13.   Kathryn Hendricks has endorsed again an Epworth Sleepiness Scale of 24 points at the fatigue severity score of 62 out of a possible 63, points. She had stopped modafinil in January after she felt it had become ineffective and told this to Dr. Halford Chessman, her treating sleep physician, only after the fact.   She reported falling asleep and feeling tired all the time again she has been is especially being concerned because she is falling asleep when driving. She has tried not to drive alone. She was advised that she is not safe to drive or operate machinery !!  The patient endorsed no cataplectic attacks. She also stated that the Provigil made her feel short of breath. Just yesterday she fell asleep at the dinner table when her sister was her Guest of on her. She was very embarrassed. She goes to bed between 7 and 8 PM and usually is asleep promptly. She will sleep through until 5-5:30 AM.  She will go once to the bathroom at night on average. He may have throat little periods of waking up but usually is able to fall asleep right away again. Her sleep does not feel restorative to her. She has lots of vivid dreams and sometimes dreams of being chased or by the nightmarish content. She has noticed more trouble with her memory but may be  fatigue  related but could be an independent finding. She feels sometimes clumsy and unsteady when walking. She is upsetting her family since she begun to repeat herself frequently and has been displacing things. She has more trouble with spelling. I would like to add that I was able to review her sleep studies here in person and an echo- cardiogram performed on 06-16-13 with a normal ejection fraction of 65% at grade 1 diastolic dysfunction and mild L a dilated patient. They have been previously no focal neurologic deficits noted.   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  She frequenlty fell asleep in school. She was napping daily after school. Life long complaint of excessive daytime sleepiness.She had a sleep study at [facility] and  Dr . Halford Chessman has followed her.  Interval history from 04-27-2015  Chief complaint according to patient : Kathryn Hendricks is here today to discuss the results of an MRI study obtained earlier this summer. She received a call that there were no acute findings but she would like to know what exactly the findings mean. She's also endorsing still a very high degree of daytime sleepiness, 20-24 points . Within our session today we obtained a Mini-Mental Status Examination .  Kathryn Hendricks's MRI of the brain from 01-27-15 documented an underdeveloped splenium of the corpus callosum, a thin lipoma and periventricular FLAIR hyperintensities which we present most likely developmental changes, and  no acute findings were seen. This MRI could not be compared to previous MRIs as none were available. Kathryn Hendricks's question if her learning disabilities could be related to an abnormality of the splenium have to be answered as a cautious possibility, this would not explain her hypersomnia and a discussed with her that her excessive daytime sleepiness is not related to  the changes in the brain as noted. Her cognitive difficulties have been with her all her life, and a Mini-Mental Status Examination today to exclude the attention and calculation part. The patient scored 25 out of 30 points. This has been a stable finding she was also able to copy an image, she was able to draw a clock face and she named 14 animals in a fluency test.  Sleep habits are as follows: She reports falling asleep in public places, for example in a restaurant in the waiting room etc. she's not able safely to operate machinery she's not able to safely drive because of her excessive daytime sleepiness. She has been prescribed modafinil as well as a stimulants which have given her a little bit more energy but not having a lasting effect. I would be hesitant to consider this patient a good candidate for Xyrem.  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 The patient works 8 to 16.30 .  06-19-2016 . Kathryn Hendricks is a 65 year old  Afro-american single female with a history of narcolepsy, mild memory impairment and ADD.  There is a suspicion of a life long learning  disability . She lost her sister in late October 2016. She is currently taking Adderall 10 mg daily- works fairly well. 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  She states that usually by the end of her shift she is exhausted. She states that sometimes when she goes home if she tries to nap  she will sleep for hours. She is able to complete all ADLs independently. She will undergo a hysterectomy within the next couple of months, she recently saw her cardiologist and was told that her cardiac function is fine, according to her. Fatigue severity is still very high for this patient, a 59 points, the Epworth sleepiness score is still endorsed at 18 points, this makes driving unsafe for her.  She inquires about FMLA for her job.   REVIEW OF SYSTEMS: Out of a  complete 14 system review of symptoms, the patient complains only of the following symptoms, and all other reviewed systems are negative.  Easily distracted,  Hypersomnia is persistent.   How likely are you to doze in the following situations: 0 = not likely, 1 = slight chance, 2 = moderate chance, 3 = high chance  Sitting and Reading? Watching Television? Sitting inactive in a public place (theater or meeting)? Lying down in the afternoon when circumstances permit? Sitting and talking to someone? Sitting quietly after lunch without alcohol? In a car, while stopped for a few minutes in traffic? As a passenger in a car for an hour without a break?  Total = 20/ 24   Negative HLA narcolepsy panel.     ALLERGIES: Allergies  Allergen Reactions   Penicillins Shortness Of Breath, Itching and Swelling    Has patient had a PCN reaction causing immediate rash, facial/tongue/throat swelling, SOB or lightheadedness with hypotension: Yes Has patient had a PCN reaction causing severe rash involving mucus membranes or skin necrosis: No Has patient had a PCN reaction that required hospitalization: No Has patient had a PCN reaction occurring within the last 10 years: No If all of the above answers are "NO", then may proceed with Cephalosporin use.     HOME MEDICATIONS: Outpatient Medications Prior to Visit  Medication Sig Dispense Refill   acetaminophen (TYLENOL) 500 MG tablet Take 500 mg by mouth 2 (two) times daily as needed.     albuterol (VENTOLIN HFA) 108 (90 Base) MCG/ACT inhaler Inhale 2 puffs into the lungs every 6 (six) hours as needed for wheezing or shortness of breath. 18 g 1   amphetamine-dextroamphetamine (ADDERALL) 10 MG tablet Take 1 tablet (10 mg total) by mouth with water 2 (two) times daily before breakfast and before lunch. 60 tablet 0   Blood Pressure Monitoring (OMRON 3 SERIES BP MONITOR) DEVI USE AS DIRECTED 1 each 0   cyclobenzaprine (FLEXERIL) 10 MG tablet TAKE 1  TABLET BY MOUTH EVERY 6 TO 8 HOURS AS NEEED FOR SPASM 30 tablet 0   fluticasone (FLONASE) 50 MCG/ACT nasal spray PLACE 1 SPRAY INTO BOTH NOSTRILS DAILY. 16 g 2   ipratropium (ATROVENT) 0.03 % nasal spray PLACE 2 SPRAYS INTO BOTH NOSTRILS 4 (FOUR) TIMES DAILY AS NEEDED FOR RHINITIS. 30 mL 3   lisinopril (ZESTRIL) 20 MG tablet Take 1 tablet (20 mg total) by mouth at bedtime. 90 tablet 1   mirabegron ER (MYRBETRIQ) 50 MG TB24 tablet Take 1 tablet (50 mg total) by mouth daily. 30 tablet 11   naproxen (NAPROSYN) 500 MG tablet TAKE 1 TABLET BY MOUTH TWICE DAILY 60 tablet 1   PREVIDENT 5000 SENSITIVE 1.1-5 % GEL USE AS DIRECTED 100 mL 12   rosuvastatin (CRESTOR) 10 MG tablet Take 1 tablet (10 mg total) by mouth daily. 90 tablet 1   traMADol (ULTRAM) 50 MG tablet Take 1 tablet (50 mg total) by mouth every 6 (six) hours as needed. 20 tablet 0   ondansetron (  ZOFRAN) 4 MG tablet TAKE 1 TABLET BY MOUTH EVERY 6 TO 8 HOURS AS NEEDED FOR POST OP NAUSEA (Patient not taking: No sig reported) 10 tablet 0   oxyCODONE-acetaminophen (PERCOCET/ROXICET) 5-325 MG tablet TAKE 1 TABLET BY MOUTH EVERY 4 TO 6 HOURS AS NEEDED FOR PAIN MAX 6 TABS PER DAY (Patient not taking: Reported on 12/06/2020) 20 tablet 0   No facility-administered medications prior to visit.    PAST MEDICAL HISTORY: Past Medical History:  Diagnosis Date   Abdominal pain, left lower quadrant 11/08/2015   Abnormal Pap smear 2011   Anxiety    Arthritis    lower back and shoulders   Bronchitis    hx - used abluterol inhaler   Chronic diastolic heart failure (HCC)    Cognitive decline    FIBROIDS, UTERUS 07/02/2009   Qualifier: Diagnosis of  By: Ta MD, Cat     GERD (gastroesophageal reflux disease)    occasional   Hyperlipidemia    Hypertension    Left shoulder pain 02/14/2016   Narcolepsy    Osteopenia 04/2019   T score -1.7 FRAX 3.4% / 0.2%   Premature atrial contractions    PVC's (premature ventricular contractions)    Seasonal allergies     SVD (spontaneous vaginal delivery)    x 3    PAST SURGICAL HISTORY: Past Surgical History:  Procedure Laterality Date   ANTERIOR AND POSTERIOR REPAIR N/A 02/19/2017   Procedure: ANTERIOR (CYSTOCELE);  Surgeon: Bjorn Loser, MD;  Location: [facility];  Service: Urology;  Laterality: N/A;   APPENDECTOMY     COLONOSCOPY     CYSTOSCOPY N/A 02/19/2017   Procedure: CYSTOSCOPY;  Surgeon: Bjorn Loser, MD;  Location: [facility];  Service: Urology;  Laterality: N/A;   DILATION AND CURETTAGE OF UTERUS  2010   HYSTEROSCOPY W/ ENDOMETRIAL ABLATION  2010   SALPINGOOPHORECTOMY Bilateral 02/19/2017   Procedure: SALPINGO OOPHORECTOMY;  Surgeon: Anastasio Auerbach, MD;  Location: [facility];  Service: Gynecology;  Laterality: Bilateral;   TUBAL LIGATION     VAGINAL HYSTERECTOMY N/A 02/19/2017   Procedure: HYSTERECTOMY VAGINAL ,  BSO.;  Surgeon: Anastasio Auerbach, MD;  Location: [facility];  Service: Gynecology;  Laterality: N/A;  DO NOT OPEN LAP INSTRUMENTS   WISDOM TOOTH EXTRACTION      FAMILY HISTORY: Family History  Problem Relation Age of Onset   Hypertension Mother    Diabetes Mother    Stroke Mother    CAD Mother 26   Narcolepsy Mother        not diagnosed   Hypertension Sister    Cancer Sister        Colon cancer   Hypertension Brother    Cancer Brother        throat   Diabetes Father    Heart disease Father     SOCIAL HISTORY: Social History   Socioeconomic History   Marital status: Divorced    Spouse name: Not on file   Number of children: 3   Years of education: 12   Highest education level: Not on file  Occupational History   Occupation: Microbiologist: [employer]  Tobacco Use   Smoking status: Never   Smokeless tobacco: Never  Vaping Use   Vaping Use: Never used  Substance and Sexual Activity   Alcohol use: No    Alcohol/week: 0.0 standard drinks   Drug use: No   Sexual activity: Never    Birth control/protection: Post-menopausal, Surgical  Comment: 1st intercourse 65 yo-5 partners  Other Topics Concern   Not on file  Social History Narrative   Caffeine 1 cup coffee in am.   Social Determinants of Health   Financial Resource Strain: Not on file  Food Insecurity: Not on file  Transportation Needs: Not on file  Physical Activity: Not on file  Stress: Not on file  Social Connections: Not on file  Intimate Partner Violence: Not on file      PHYSICAL EXAM  Vitals:   03/08/21 1508  BP: 136/74  Pulse: 73  Weight: 163 lb (73.9 kg)  Height: _0  (1.626 m)   Body mass index is 27.98 kg/m.  MMSE - Mini Mental State Exam 03/05/2017 06/19/2016 11/03/2015  Orientation to time _1 Orientation to Place _2 Registration _3 Attention/ Calculation 0 2 4  Recall _4 Language- name 2 objects _5 Language- repeat 0 0 0  Language- follow 3 step command _6 Language- read & follow direction _7 Write a sentence 0 0 1  Copy design 1 0 0  Total score _8 Finished High school , had special education classes. Writing and calculation ability is poor.   General: The patient is awake, alert and appears not in acute distress. The patient is well groomed. Head: Normocephalic, atraumatic. Neck is supple. Mallampati 3, neck circumference:13 inches  Cardiovascular:  Regular rate and rhythm , without  murmurs or carotid bruit, and without distended neck veins. Respiratory: Lungs are clear to auscultation.Skin:  Without evidence of edema, or rash  Neurologic exam : The patient is awake and alert, oriented to place and time. Memory subjective described as impaired- There is a reduced attention span & concentration ability. Speech is fluent with mild  dysarthria, dysphonia or aphasia. Mood and affect are appropriate.  Cranial nerves: Pupils are equal and briskly reactive to light. Extraocular movements  in vertical and horizontal planes intact and without nystagmus. Visual fields by finger perimetry are  intact.Hearing to finger rub intact.   Facial sensation intact to fine touch. Facial motor strength is symmetric and tongue and uvula move midline.  Motor exam: Normal tone and normal muscle bulk - there is loss of grip strength and thenar eminence is atrophic.  Sensory:  Fine touch and vibration were normal to ankle and wrist. . Coordination:  Finger-to-nose maneuver without evidence of ataxia or tremor. Gait and station: Patient walks without assistive device. Strength within normal limits.  She is not stooped, no drifting . She walks with a normal base. Stance is stable and normal- Steps are unfragmented.  Deep tendon reflexes: in the  upper and lower extremities are symmetric, 2 plus. No clonus.    DIAGNOSTIC DATA (LABS, IMAGING, TESTING) - I reviewed patient records, labs, notes, testing and imaging myself where available. Labs from 2020 were reviewed.    OSA on CPAP- poor compliance: Kathryn Hendricks reported that she felt too much pressure and too much humidity were delivered at once.  I today we fitted her with her mask.  A trial run and a mask fitting I also reduced the humidity setting on her CPAP from 4-2 eyes started at 10-minute ramp and I reduced the tubing heating temperature to 60 degrees.  It may still be too high but she will let us know.  She felt that it was comfortable to use the nasal pillow with  these pressures between 5 and 10 cm water.  1) idiopathic hypersomnia versus narcolepsy- HLA test negative in 2019, MSLT was positive ( ?)  New family history - a cousin was diagnosed with narcolepsy after sleep related  MVA - her grandson is diagnosed with ADD. 2 grandchildren are very sleepy.  Residual hypersomnia with use of CPAP.     ASSESSMENT AND PLAN:  1.Narcolepsy poorly was controlled on Adderall, one a day 10 mg. No HTN resulting. persistent hypersomnia in narcolepsy. Cataplexy described as "dropping objects" when startled.  Sunosi caused tachycardia. .NO Atrial  fibrillation was diagnosed- she has SVT -SVT and adderall are not a good mix, but she is unable to function on ritalin- since she failed Sunosi.  2. Carpal tunnel soft braces.  3) OSA - we reset CPAP humidity and RAMP, hopefully improving compliance.   4) cognitive function monitoring . Please have PCP refer for attention deficit testing , since your daughter has similar problems/symptoms and carries this diagnosis.   I will schedule her for MMSE with NP in 5 month , 06-2021.      Larey Seat, MD  03/08/2021, 3:52 PM [facility] [address] [place], [place] [address] [phone]

## 2021-03-15 ENCOUNTER — Other Ambulatory Visit: Payer: Self-pay

## 2021-03-15 ENCOUNTER — Ambulatory Visit
Admission: RE | Admit: 2021-03-15 | Discharge: 2021-03-15 | Disposition: A | Discharge status: Home / Self Care | Payer: 59 | Source: Ambulatory Visit | Attending: Family Medicine | Admitting: Family Medicine

## 2021-03-15 DIAGNOSIS — Z1231 Encounter for screening mammogram for malignant neoplasm of breast: Secondary | ICD-10-CM

## 2021-03-15 IMAGING — MG MM DIGITAL SCREENING BILAT W/ TOMO AND CAD
8 series · 8 of 24 positions shown · non-contrast
Comparison: Previous exam(s).

CLINICAL DATA: Screening.

EXAM:
DIGITAL SCREENING BILATERAL MAMMOGRAM WITH TOMOSYNTHESIS AND CAD
TECHNIQUE: Bilateral screening digital craniocaudal and mediolateral oblique
mammograms were obtained. Bilateral screening digital breast
tomosynthesis was performed. The images were evaluated with
computer-aided detection.

[L MLO synth-2D]
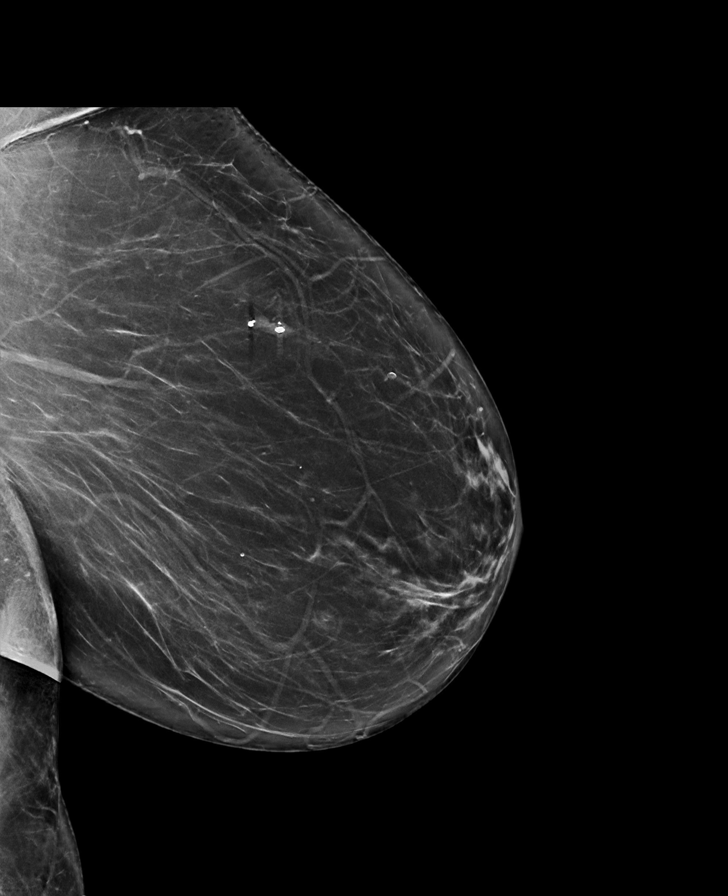

[R CC synth-2D]
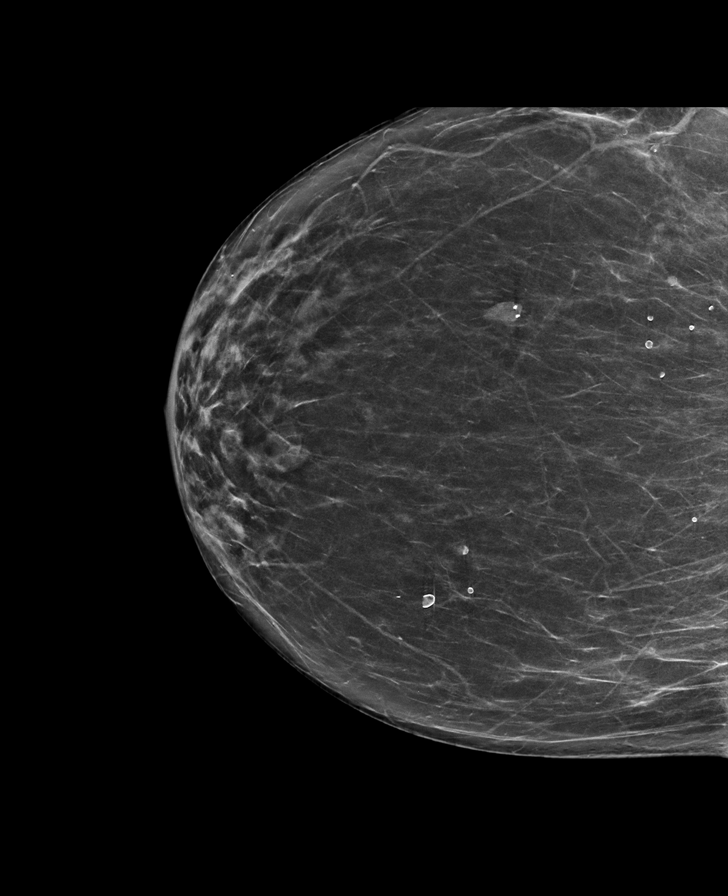

[R MLO synth-2D]
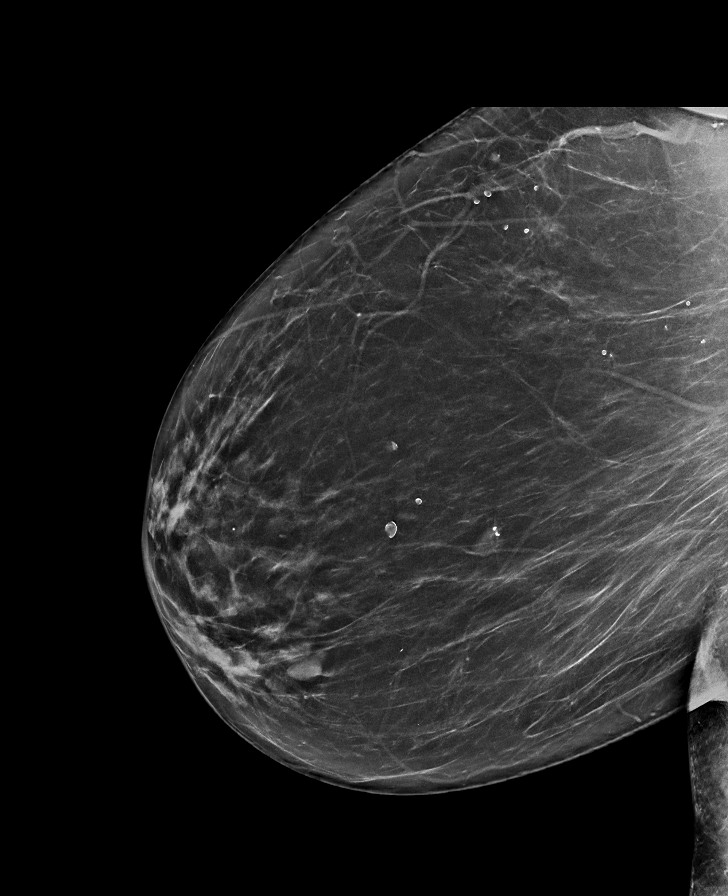

[L CC synth-2D]
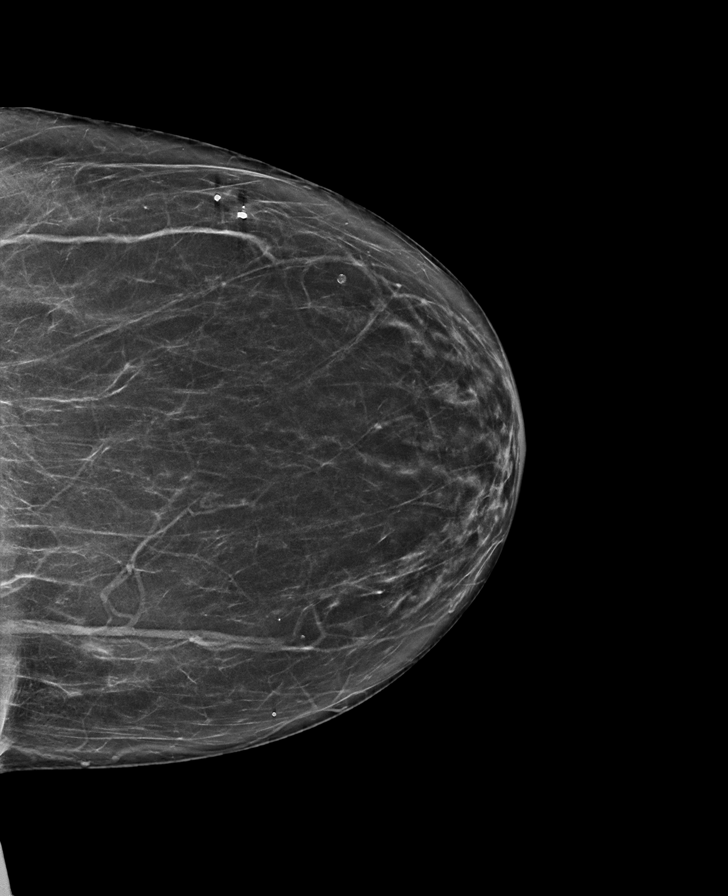

[L MLO tomo · tomo slice 41/80.0]
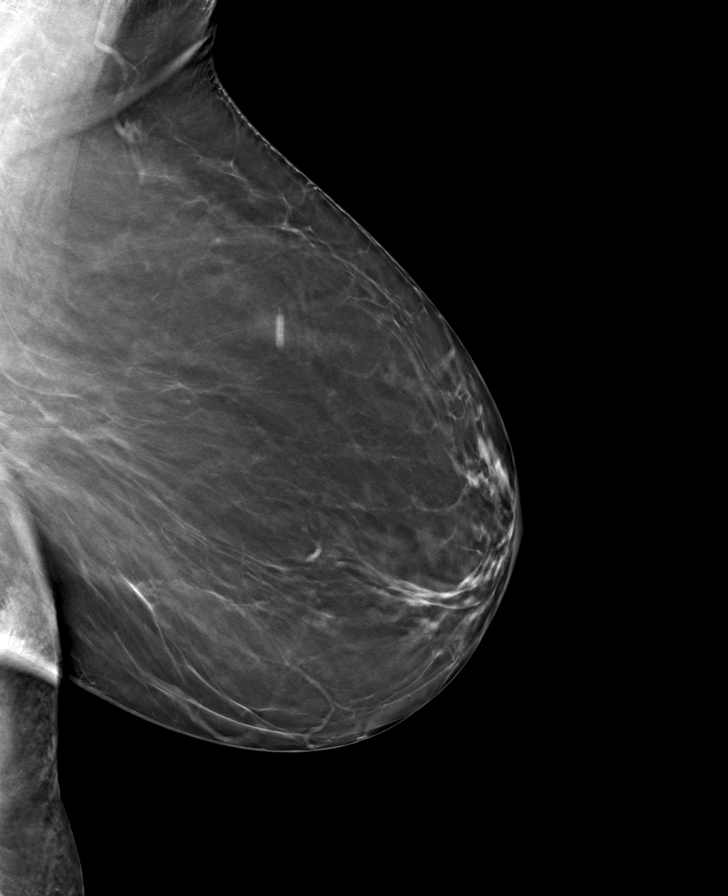

[R MLO tomo · tomo slice 41/82.0]
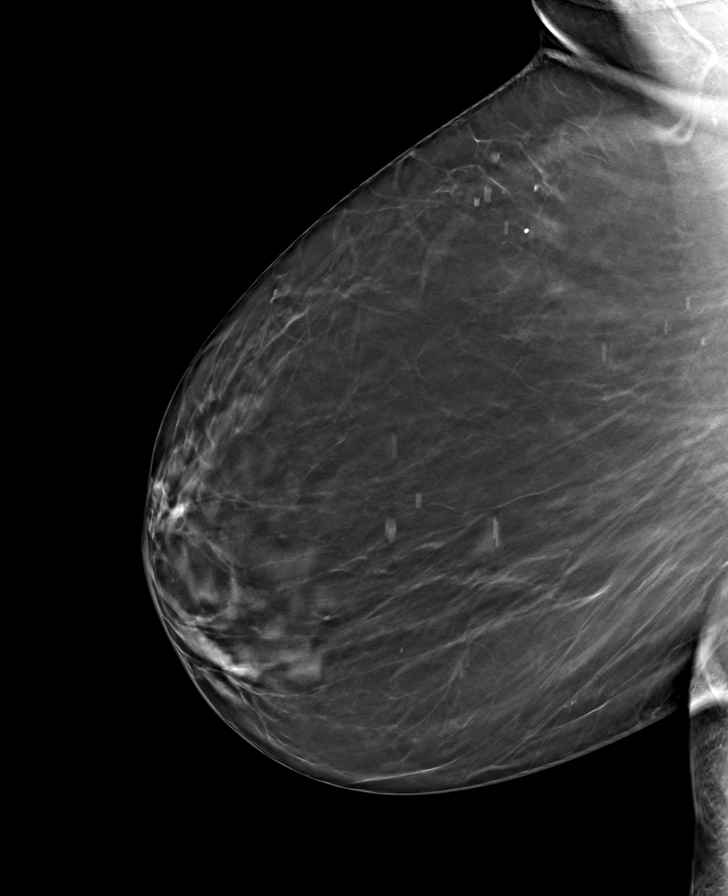

[L CC tomo · tomo slice 35/68.0]
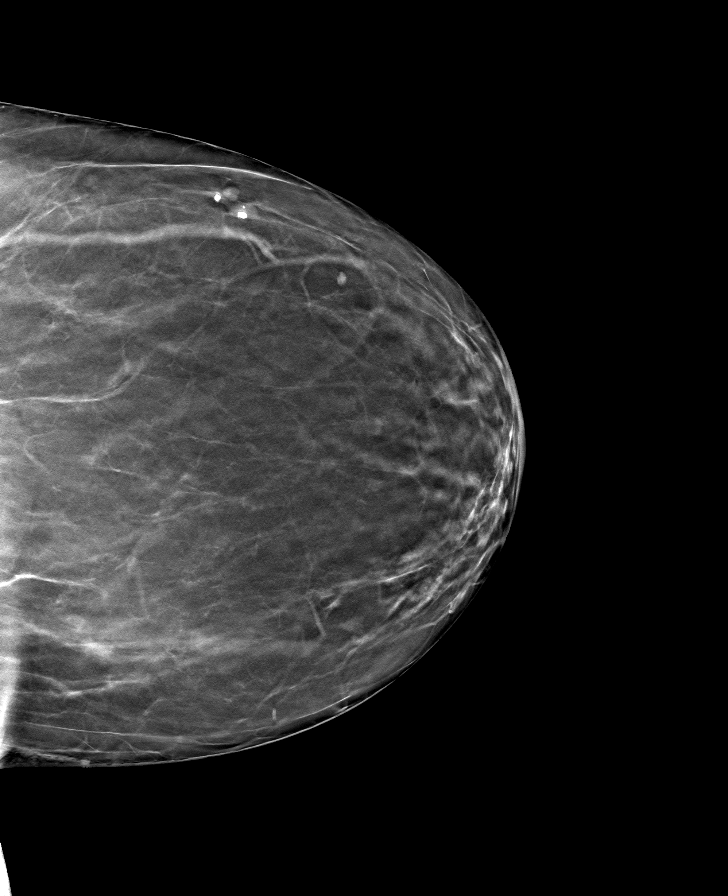

[R CC tomo · tomo slice 36/71.0]
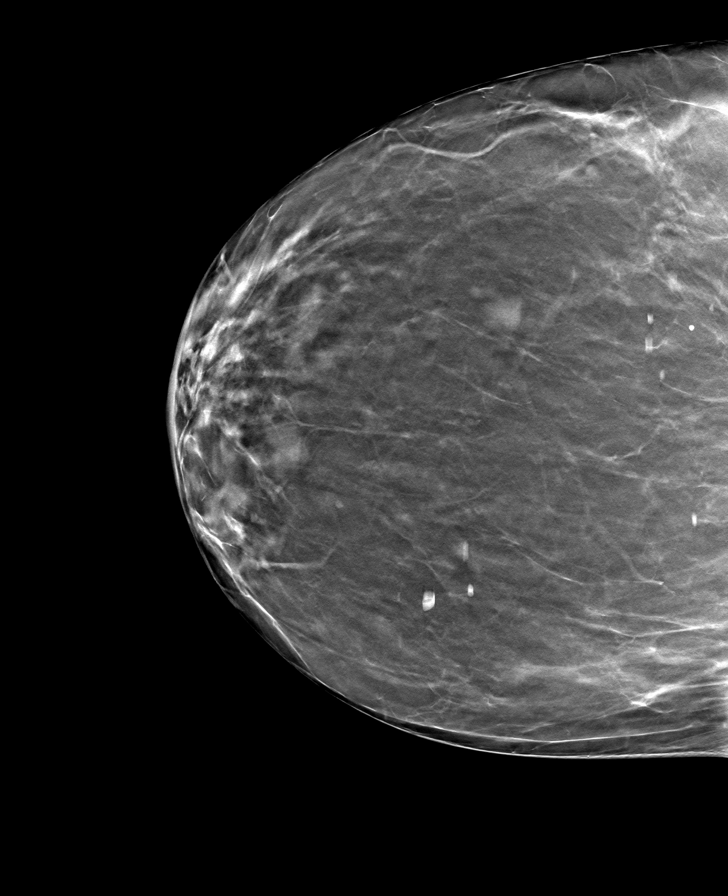

[8 of 24 positions shown; findings below may reference images not displayed]

ACR Breast Density Category b: There are scattered areas of
fibroglandular density.
FINDINGS: There are no findings suspicious for malignancy.
IMPRESSION: No mammographic evidence of malignancy. A result letter of this
screening mammogram will be mailed directly to the patient.

RECOMMENDATION:
Screening mammogram in one year. (Code:[BY])

BI-RADS CATEGORY  1: Negative.

## 2021-03-21 ENCOUNTER — Other Ambulatory Visit (HOSPITAL_COMMUNITY): Payer: Self-pay

## 2021-03-22 ENCOUNTER — Other Ambulatory Visit (HOSPITAL_COMMUNITY): Payer: Self-pay

## 2021-03-22 DIAGNOSIS — Z4789 Encounter for other orthopedic aftercare: Secondary | ICD-10-CM | POA: Diagnosis not present

## 2021-03-24 ENCOUNTER — Other Ambulatory Visit: Payer: Self-pay | Admitting: Neurology

## 2021-03-24 NOTE — Telephone Encounter (Signed)
Patient called after-hours call center with a message stating that she has a severe migraine when she takes her new medication.  I reviewed her chart.  I attempted to call her back but had to leave a voice message.  She was recently started on Provigil.  If she calls back I will ask her to stop taking the Provigil and get in touch with Dr. Brett Fairy during the next workday for alternative medication options. I will also pass on the message to her for review.

## 2021-03-27 NOTE — Addendum Note (Signed)
Addended by: Larey Seat on: 03/27/2021 06:02 PM   Modules accepted: Orders

## 2021-03-27 NOTE — Telephone Encounter (Signed)
Patient reports headaches on Provigil. Will d/c the medication and try wakix or sunosi.

## 2021-03-28 ENCOUNTER — Other Ambulatory Visit (HOSPITAL_COMMUNITY): Payer: Self-pay

## 2021-03-28 MED ORDER — LISDEXAMFETAMINE DIMESYLATE 20 MG PO CAPS
20.0000 mg | ORAL_CAPSULE | ORAL | 0 refills | Status: DC
  Filled 2021-03-28: qty 30, 30d supply, fill #0

## 2021-03-28 NOTE — Telephone Encounter (Signed)
Called the patient back she states that modafinil caused her to have headaches and states that really wasn't helping with the daytime sleepiness. Reviewed medications that she had tried and failed. Pt states that she is willing to try the vyvanse. Informed her that we would start out on low dose and that if she finds after taking for a bit its not helping we have room to increase the dose she can let us know.

## 2021-03-28 NOTE — Addendum Note (Signed)
Addended by: Darleen Crocker on: 03/28/2021 11:07 AM   Modules accepted: Orders

## 2021-03-28 NOTE — Telephone Encounter (Signed)
Pt has tried Sunosi and it caused elevated BP. In reviewing meds tried and failed. Pt has not tried Vyvanse. Discussed with Dr Brett Fairy about trying this medication. Will discuss with the pt to see what she thinks

## 2021-03-29 ENCOUNTER — Other Ambulatory Visit (HOSPITAL_COMMUNITY): Payer: Self-pay

## 2021-04-03 ENCOUNTER — Other Ambulatory Visit (HOSPITAL_COMMUNITY): Payer: Self-pay

## 2021-04-21 ENCOUNTER — Other Ambulatory Visit (HOSPITAL_COMMUNITY): Payer: Self-pay

## 2021-04-21 ENCOUNTER — Other Ambulatory Visit: Payer: Self-pay | Admitting: Neurology

## 2021-04-24 ENCOUNTER — Telehealth: Payer: Self-pay | Admitting: Neurology

## 2021-04-24 ENCOUNTER — Other Ambulatory Visit (HOSPITAL_COMMUNITY): Payer: Self-pay

## 2021-04-24 ENCOUNTER — Other Ambulatory Visit: Payer: Self-pay | Admitting: Neurology

## 2021-04-24 NOTE — Telephone Encounter (Signed)
Pt called she states her sleepiness has gotten worse and she feels miserable. Pt requesting a call back.

## 2021-04-24 NOTE — Telephone Encounter (Signed)
Called the patient and she is not staying awake at all with the medication at 20 mg. She even tried taking 40 mg in the morning and felt no effects

## 2021-04-25 ENCOUNTER — Other Ambulatory Visit (HOSPITAL_COMMUNITY): Payer: Self-pay

## 2021-04-25 MED ORDER — LISDEXAMFETAMINE DIMESYLATE 70 MG PO CAPS
70.0000 mg | ORAL_CAPSULE | Freq: Every day | ORAL | 0 refills | Status: DC
  Filled 2021-04-25: qty 30, 30d supply, fill #0

## 2021-04-25 NOTE — Telephone Encounter (Signed)
Going to vyvanse 70 mg po.

## 2021-04-27 ENCOUNTER — Other Ambulatory Visit (HOSPITAL_COMMUNITY): Payer: Self-pay

## 2021-05-30 ENCOUNTER — Other Ambulatory Visit (HOSPITAL_COMMUNITY): Payer: Self-pay

## 2021-05-30 ENCOUNTER — Other Ambulatory Visit: Payer: Self-pay | Admitting: Neurology

## 2021-05-30 NOTE — Telephone Encounter (Signed)
Patient is up to date on her appointments. Patient is due for a refill on vyvanse. [place] Controlled Substance Registry checked and is appropriate.

## 2021-05-31 ENCOUNTER — Other Ambulatory Visit (HOSPITAL_COMMUNITY): Payer: Self-pay

## 2021-05-31 MED ORDER — LISDEXAMFETAMINE DIMESYLATE 70 MG PO CAPS
70.0000 mg | ORAL_CAPSULE | Freq: Every day | ORAL | 0 refills | Status: DC
  Filled 2021-05-31: qty 30, 30d supply, fill #0

## 2021-06-02 ENCOUNTER — Other Ambulatory Visit (HOSPITAL_COMMUNITY): Payer: Self-pay

## 2021-06-06 ENCOUNTER — Encounter: Payer: Self-pay | Admitting: Family Medicine

## 2021-06-06 ENCOUNTER — Ambulatory Visit (INDEPENDENT_AMBULATORY_CARE_PROVIDER_SITE_OTHER): Payer: 59 | Admitting: Family Medicine

## 2021-06-06 ENCOUNTER — Other Ambulatory Visit: Payer: Self-pay

## 2021-06-06 ENCOUNTER — Ambulatory Visit (INDEPENDENT_AMBULATORY_CARE_PROVIDER_SITE_OTHER): Payer: 59

## 2021-06-06 VITALS — BP 133/78 | HR 77 | Ht 64.0 in | Wt 163.0 lb

## 2021-06-06 DIAGNOSIS — M75102 Unspecified rotator cuff tear or rupture of left shoulder, not specified as traumatic: Secondary | ICD-10-CM

## 2021-06-06 DIAGNOSIS — I10 Essential (primary) hypertension: Secondary | ICD-10-CM | POA: Diagnosis not present

## 2021-06-06 DIAGNOSIS — G47411 Narcolepsy with cataplexy: Secondary | ICD-10-CM | POA: Diagnosis not present

## 2021-06-06 DIAGNOSIS — Z23 Encounter for immunization: Secondary | ICD-10-CM

## 2021-06-06 DIAGNOSIS — E7800 Pure hypercholesterolemia, unspecified: Secondary | ICD-10-CM | POA: Diagnosis not present

## 2021-06-06 DIAGNOSIS — G4730 Sleep apnea, unspecified: Secondary | ICD-10-CM | POA: Diagnosis not present

## 2021-06-06 NOTE — Assessment & Plan Note (Signed)
S/P arthroscopic rotator cuff repair. Patient is unable to use left UL effectively due to pain. She continues to f/u with her orthopedics. She will benefit from PT - she need to discuss this with her orthopedic. Regarding disability, I gave her information on initiating the process with [place] [employer]. If she is unable to complete this process, I will connect her with CCM. She agreed with the plan.

## 2021-06-06 NOTE — Assessment & Plan Note (Signed)
Stable on CPAP 

## 2021-06-06 NOTE — Assessment & Plan Note (Signed)
BP looks great. Continue same regimen.

## 2021-06-06 NOTE — Assessment & Plan Note (Signed)
Stable on Crestor.

## 2021-06-06 NOTE — Patient Instructions (Signed)
It was nice seeing you today. Please contact the department of social service to initiate the disability process. [place]. I will also refer you to Chronic Care Management to guide you through the disability process.  Address: [address] [place], [place] [address] Hours:  Phone: [phone]

## 2021-06-06 NOTE — Assessment & Plan Note (Signed)
Management per her neurologist. She will qualify for disability with her multiple chronic problems. I gave her information on initiating the process with [place] [employer]. If she is unable to complete the disability process, I will connect her with CCM. She agreed with the plan.

## 2021-06-06 NOTE — Progress Notes (Signed)
    SUBJECTIVE:   CHIEF COMPLAINT / HPI:   HTN/HLD: She is compliant with Lisinopril 20 mg qd and Crestor 10 mg QD. No concerns.  Narcolepsy/Sleep apnea: She is plugged in with the [facility] for her Narcolepsy. The neurologist feels her daytime sleepiness could also be related to sleep apnea and fatigue, for which she wears nightly sleep machine. She is currently on Vyvance for this condition.  Left shoulder pain/Nontraumatic complete tear of the left rotator cuff:  She has been out of work since January of 2022 and requested a disability application since she cannot return to work. She follows up closely with the [facility] orthopedic group. Her shoulder pain persists despite rotator cuff surgery.    PERTINENT  PMH / PSH: PMX reviewed  OBJECTIVE:   BP 133/78   Pulse 77   Ht 5\' 4"  (1.626 m)   Wt 163 lb (73.9 kg)   SpO2 100%   BMI 27.98 kg/m   Physical Exam Vitals and nursing note reviewed.  Cardiovascular:     Rate and Rhythm: Normal rate and regular rhythm.     Heart sounds: Normal heart sounds. No murmur heard. Pulmonary:     Effort: Pulmonary effort is normal. No respiratory distress.     Breath sounds: Normal breath sounds. No wheezing or rhonchi.  Abdominal:     General: Abdomen is flat. Bowel sounds are normal.     Palpations: Abdomen is soft. There is no mass.     Tenderness: There is no abdominal tenderness.  Musculoskeletal:     Right shoulder: No swelling. Normal range of motion.     Left shoulder: Tenderness present. No swelling. Decreased range of motion.     Right lower leg: No edema.     Left lower leg: No edema.     ASSESSMENT/PLAN:   HYPERTENSION, BENIGN BP looks great. Continue same regimen.   HYPERCHOLESTEROLEMIA Stable on Crestor.   Rotator cuff tear S/P arthroscopic rotator cuff repair. Patient is unable to use left UL effectively due to pain. She continues to f/u with her orthopedics. She will benefit from PT - she need  to discuss this with her orthopedic. Regarding disability, I gave her information on initiating the process with [place] [employer]. If she is unable to complete this process, I will connect her with CCM. She agreed with the plan.  Narcolepsy and cataplexy Management per her neurologist. She will qualify for disability with her multiple chronic problems. I gave her information on initiating the process with [place] [employer]. If she is unable to complete the disability process, I will connect her with CCM. She agreed with the plan.  Sleep apnea Stable on CPAP.   Flu shot and COVID-19 booster shots discussed and she agreed on getting vaccinated. CMA is aware to vaccinate.  Andrena Mews, MD [place]

## 2021-06-21 ENCOUNTER — Other Ambulatory Visit (HOSPITAL_COMMUNITY): Payer: Self-pay

## 2021-06-22 ENCOUNTER — Telehealth: Payer: Self-pay | Admitting: Neurology

## 2021-06-22 DIAGNOSIS — G4719 Other hypersomnia: Secondary | ICD-10-CM

## 2021-06-22 DIAGNOSIS — G4710 Hypersomnia, unspecified: Secondary | ICD-10-CM

## 2021-06-22 DIAGNOSIS — G3184 Mild cognitive impairment, so stated: Secondary | ICD-10-CM

## 2021-06-22 NOTE — Telephone Encounter (Signed)
Called the patient back and advised that we can send an order for them to do a mask refit to ensure the mask is sealing well as well as order a chin strap. Pt verbalized understanding. Pt had no questions at this time but was encouraged to call back if questions arise.

## 2021-06-22 NOTE — Telephone Encounter (Signed)
Pt called, air pressure on my CPAP machine keep waking me up. Getting too much air. Contacted [facility], they said I'm losing air because I sleep with my mouth open and machine air goes up. Would like a call from nurse.

## 2021-06-22 NOTE — Progress Notes (Signed)
CM sent to [facility] 

## 2021-06-30 NOTE — Telephone Encounter (Signed)
Pt called, my chin strap have not been delivered. Can not to use my CPAP machine. Would like a call from the nurse.

## 2021-07-02 NOTE — Telephone Encounter (Signed)
I will contact the DME company for the patient. We don't have control over the equipment that comes from [place]. Pt must call [employer] at [phone]. I will have the DME company contact the patient to check status.   ** If pt returns call please defer her to calling the DME company.

## 2021-07-03 ENCOUNTER — Other Ambulatory Visit: Payer: Self-pay | Admitting: Neurology

## 2021-07-05 DIAGNOSIS — M25511 Pain in right shoulder: Secondary | ICD-10-CM | POA: Diagnosis not present

## 2021-07-05 DIAGNOSIS — M25512 Pain in left shoulder: Secondary | ICD-10-CM | POA: Diagnosis not present

## 2021-07-05 NOTE — Telephone Encounter (Signed)
Pt has called to check on the status of the mouth piece she was told was being ordered by the RN.  Please call

## 2021-07-06 ENCOUNTER — Other Ambulatory Visit: Payer: Self-pay | Admitting: Family Medicine

## 2021-07-06 ENCOUNTER — Other Ambulatory Visit (HOSPITAL_COMMUNITY): Payer: Self-pay

## 2021-07-06 ENCOUNTER — Other Ambulatory Visit: Payer: Self-pay | Admitting: Neurology

## 2021-07-06 DIAGNOSIS — G4733 Obstructive sleep apnea (adult) (pediatric): Secondary | ICD-10-CM | POA: Diagnosis not present

## 2021-07-06 MED ORDER — LISINOPRIL 20 MG PO TABS
20.0000 mg | ORAL_TABLET | Freq: Every day | ORAL | 1 refills | Status: DC
  Filled 2021-07-06: qty 90, 90d supply, fill #0
  Filled 2021-10-20: qty 90, 90d supply, fill #1

## 2021-07-06 MED ORDER — ROSUVASTATIN CALCIUM 10 MG PO TABS
10.0000 mg | ORAL_TABLET | Freq: Every day | ORAL | 1 refills | Status: DC
  Filled 2021-07-06: qty 90, 90d supply, fill #0
  Filled 2021-10-20: qty 90, 90d supply, fill #1

## 2021-07-06 MED ORDER — LISDEXAMFETAMINE DIMESYLATE 70 MG PO CAPS
70.0000 mg | ORAL_CAPSULE | Freq: Every day | ORAL | 0 refills | Status: DC
  Filled 2021-07-06: qty 30, 30d supply, fill #0

## 2021-07-06 NOTE — Telephone Encounter (Signed)
Received refill request for Vyvanse.  Last OV was on 03/08/21.  Next OV is scheduled for 07/12/21 .  Last RX was written on 05/31/21 for 30 tabs.   [place] Drug Database has been reviewed.

## 2021-07-06 NOTE — Telephone Encounter (Signed)
Called and relayed message to pt. Pt verbalized understanding and had no further questions at this time.

## 2021-07-06 NOTE — Telephone Encounter (Signed)
I have sent the order to [employer] a while back. I contacted them Monday morning to advise the patient was reaching out. I was told someone should be reaching out to the patient. I have messaged their management team toay to see if someone has contacted the patient.  We don't provide the supplies, we just send the order to the company and they will help. Please let the pt know [phone] is the companies number and she needs to check with them unless they have given her a call.

## 2021-07-12 ENCOUNTER — Ambulatory Visit: Payer: 59 | Admitting: Family Medicine

## 2021-07-24 ENCOUNTER — Telehealth: Payer: Self-pay | Admitting: Neurology

## 2021-07-24 NOTE — Telephone Encounter (Signed)
Pt called in with new ins Friday Ins plan MEM ID: [id number] GRP#: [id number] [id number] GRP#: [id number]

## 2021-08-23 ENCOUNTER — Telehealth: Payer: Self-pay | Admitting: Neurology

## 2021-08-23 NOTE — Telephone Encounter (Signed)
I have sent a message to [employer]. From our standpoint we dont send the DME faxes as they have access to epic for orders and notes. There is a up to date order in from October.  They should have everything needed but I am having them check in on this and contact the patient.

## 2021-08-23 NOTE — Telephone Encounter (Signed)
[facility] states they have everything they need and will contact the patient.

## 2021-08-23 NOTE — Telephone Encounter (Signed)
Pt called saying her DME sent over a fax for her to get new supplies for her CPAP machine. Pt requesting a call back.

## 2021-08-30 ENCOUNTER — Other Ambulatory Visit (HOSPITAL_COMMUNITY): Payer: Self-pay

## 2021-08-30 ENCOUNTER — Other Ambulatory Visit: Payer: Self-pay | Admitting: Neurology

## 2021-08-30 MED ORDER — LISDEXAMFETAMINE DIMESYLATE 70 MG PO CAPS
70.0000 mg | ORAL_CAPSULE | Freq: Every day | ORAL | 0 refills | Status: DC
  Filled 2021-08-30: qty 30, 30d supply, fill #0

## 2021-08-30 NOTE — Telephone Encounter (Signed)
Received refill request for Vyvanse.  Last OV was on 03/08/21.  Next OV is scheduled for 09/11/21 .  Last RX was written on 07/06/21 for 30 tabs.   [place] Drug Database has been reviewed.

## 2021-08-31 ENCOUNTER — Other Ambulatory Visit (HOSPITAL_COMMUNITY): Payer: Self-pay

## 2021-09-01 ENCOUNTER — Other Ambulatory Visit (HOSPITAL_COMMUNITY): Payer: Self-pay

## 2021-09-11 ENCOUNTER — Ambulatory Visit: Payer: 59 | Admitting: Family Medicine

## 2021-09-11 ENCOUNTER — Other Ambulatory Visit: Payer: Self-pay

## 2021-09-11 ENCOUNTER — Encounter: Payer: Self-pay | Admitting: Family Medicine

## 2021-09-11 VITALS — BP 116/82 | HR 73 | Ht 64.0 in | Wt 153.5 lb

## 2021-09-11 DIAGNOSIS — G4733 Obstructive sleep apnea (adult) (pediatric): Secondary | ICD-10-CM

## 2021-09-11 DIAGNOSIS — R413 Other amnesia: Secondary | ICD-10-CM

## 2021-09-11 DIAGNOSIS — Z9989 Dependence on other enabling machines and devices: Secondary | ICD-10-CM

## 2021-09-11 DIAGNOSIS — G47411 Narcolepsy with cataplexy: Secondary | ICD-10-CM | POA: Diagnosis not present

## 2021-09-11 NOTE — Progress Notes (Signed)
PATIENT: Kathryn Hendricks DOB: 1955-12-31  REASON FOR VISIT: follow up HISTORY FROM: patient  Chief Complaint  Patient presents with   Follow-up    Rm 2, alone. Pt reports having issues falling asleep at night. Has trouble w memory. Forgetting how to spell. Falling asleep, like the waiting area today. Pt reports having difficulties with CPAP. Feels like it has to much pressure and waking her up at night. MMSE:21     HISTORY OF PRESENT ILLNESS:  09/11/21 ALL:  Kathryn Hendricks is a 66 y.o. female here today for follow up for OSA on CPAP and narcolepsy with cataplexy.  HLA testing negative, MSLT positive 2014.   She continues to have difficulty with CPAP compliance. Dr Dohmeier adjusted tubing temperature, reduced humidity and fitted her for new mask at last appt. She reports that she can not tolerate pressure settings. She feels that her nose burns with use. She does not feeling better and less tired when she uses CPAP. She feels less forgetful when using CPAP all night.   At last visit with Dr Brett Fairy, Adderall was discontinued and modafinil started. It is unclear from notes if she took modafinil or when medication was switched to Vyvanse. She thinks she "had problems with modafinil". She was switched to Vyvanse in 03/2021. Kathryn Hendricks called the office 04/24/2021 reporting Vyvanse 59m not effective. She had increased to 426mand had not noted any improvement. Dose was increased to 7042mShe feels that it may be working better than Adderall. She reports that some days are better than others. She gets very tired during the day. She is no longer working due to shoulder injury and not as active. She falls asleep if she sits for prolonged periods of time.   She was advised to follow up with PCP for request to have formal attention deficit testing. She has not yet reached out. She feels that she is more withdrawn and not wanting to be around people. She has lost some family members around Christmas  and feels more depressed. No SI. She reports symptoms seem to be improving with time. She continues to worry about her memory. She has more trouble remembering how to spell words that she feels should be easy to spell, ie. girl or succeed.   She lives alone. She manages her home and medications independently. She is able to perform ADLs independently. She drives without difficulty. No concerns of falling asleep while driving. She reports having a difficult time in school. She was in special ed classes. Learning has always been difficult for her. Her mother was diagnosed with dementia.     HISTORY: (copied from Dr Dohmeier's previous note)  03-08-2021:  Kathryn Hendricks that she has difficulties with her rotator cuff and biceps tendon she had a surgical repair but has not healed as she would like.  So she has been out of work for the last 6 months.  Her Epworth Sleepiness Scale has remained highly elevated at 17 out of 24 points and her explanation was that she cannot use her CPAP reliably.  Apparently it feels to her as if there is much too much pressure suddenly imposed on her.  So she has not been using it recently and it is waking her up.  The DME most telling her to bring in her CPAP for sleep for adjustments.  The patient reports a degree of sleepiness that may not all be up to obstructive sleep apnea but to her underlying condition of persistent  hypersomnia.  The patient was diagnosed in November 2020 with sleep apnea it was a very mild sleep apnea with an AHI of 7.3/h the REM AHI was 19.4 and her sleep appeared fragmented snoring was present but oxygen saturation was stable throughout the night.  I have ordered an auto titration CPAP between 5 and 12 cmH2O with 1 cm EPR and we had discussed alternatives I wonder if she may want to try a dental device given that her apnea is REM dependent.     Given her very high Epworth sleepiness scores and the difficulties with CPAP use it may be worse while  reducing her AHI is somewhat rather than not treating it at all.   REVIEW OF SYSTEMS: Out of a complete 14 system review of symptoms, the patient complains only of the following symptoms, memory loss, daytime sleepiness, inattention, and all other reviewed systems are negative.  ESS: not performed today   ALLERGIES: Allergies  Allergen Reactions   Penicillins Shortness Of Breath, Itching and Swelling    Has patient had a PCN reaction causing immediate rash, facial/tongue/throat swelling, SOB or lightheadedness with hypotension: Yes Has patient had a PCN reaction causing severe rash involving mucus membranes or skin necrosis: No Has patient had a PCN reaction that required hospitalization: No Has patient had a PCN reaction occurring within the last 10 years: No If all of the above answers are "NO", then may proceed with Cephalosporin use.     HOME MEDICATIONS: Outpatient Medications Prior to Visit  Medication Sig Dispense Refill   acetaminophen (TYLENOL) 500 MG tablet Take 500 mg by mouth 2 (two) times daily as needed.     albuterol (VENTOLIN HFA) 108 (90 Base) MCG/ACT inhaler Inhale 2 puffs into the lungs every 6 (six) hours as needed for wheezing or shortness of breath. 18 g 1   lisdexamfetamine (VYVANSE) 70 MG capsule Take 1 capsule (70 mg total) by mouth daily. 30 capsule 0   lisinopril (ZESTRIL) 20 MG tablet Take 1 tablet (20 mg total) by mouth at bedtime. 90 tablet 1   mirabegron ER (MYRBETRIQ) 50 MG TB24 tablet Take 1 tablet (50 mg total) by mouth daily. 30 tablet 11   naproxen (NAPROSYN) 500 MG tablet TAKE 1 TABLET BY MOUTH TWICE DAILY 60 tablet 1   rosuvastatin (CRESTOR) 10 MG tablet Take 1 tablet (10 mg total) by mouth daily. 90 tablet 1   No facility-administered medications prior to visit.    PAST MEDICAL HISTORY: Past Medical History:  Diagnosis Date   Abdominal pain, left lower quadrant 11/08/2015   Abnormal Pap smear 2011   Anxiety    Arthritis    lower back and  shoulders   Bronchitis    hx - used abluterol inhaler   Chronic diastolic heart failure (HCC)    Cognitive decline    FIBROIDS, UTERUS 07/02/2009   Qualifier: Diagnosis of  By: Earley Favor MD, Cat     GERD (gastroesophageal reflux disease)    occasional   Hyperlipidemia    Hypersomnia, persistent 11/05/2017   Hypertension    Left shoulder pain 02/14/2016   Narcolepsy    Osteopenia 04/2019   T score -1.7 FRAX 3.4% / 0.2%   Premature atrial contractions    PVC's (premature ventricular contractions)    Seasonal allergies    SVD (spontaneous vaginal delivery)    x 3   Weight gain 01/19/2021    PAST SURGICAL HISTORY: Past Surgical History:  Procedure Laterality Date   ANTERIOR AND POSTERIOR REPAIR  N/A 02/19/2017   Procedure: ANTERIOR (CYSTOCELE);  Surgeon: Bjorn Loser, MD;  Location: [facility];  Service: Urology;  Laterality: N/A;   APPENDECTOMY     COLONOSCOPY     CYSTOSCOPY N/A 02/19/2017   Procedure: CYSTOSCOPY;  Surgeon: Bjorn Loser, MD;  Location: [facility];  Service: Urology;  Laterality: N/A;   DILATION AND CURETTAGE OF UTERUS  2010   HYSTEROSCOPY W/ ENDOMETRIAL ABLATION  2010   SALPINGOOPHORECTOMY Bilateral 02/19/2017   Procedure: SALPINGO OOPHORECTOMY;  Surgeon: Anastasio Auerbach, MD;  Location: [facility];  Service: Gynecology;  Laterality: Bilateral;   TUBAL LIGATION     VAGINAL HYSTERECTOMY N/A 02/19/2017   Procedure: HYSTERECTOMY VAGINAL ,  BSO.;  Surgeon: Anastasio Auerbach, MD;  Location: [facility];  Service: Gynecology;  Laterality: N/A;  DO NOT OPEN LAP INSTRUMENTS   WISDOM TOOTH EXTRACTION      FAMILY HISTORY: Family History  Problem Relation Age of Onset   Hypertension Mother    Diabetes Mother    Stroke Mother    CAD Mother 12   Narcolepsy Mother        not diagnosed   Hypertension Sister    Cancer Sister        Colon cancer   Hypertension Brother    Cancer Brother        throat   Diabetes Father    Heart disease Father     SOCIAL HISTORY: Social History    Socioeconomic History   Marital status: Divorced    Spouse name: Not on file   Number of children: 3   Years of education: 12   Highest education level: Not on file  Occupational History   Occupation: Microbiologist: [employer]  Tobacco Use   Smoking status: Never   Smokeless tobacco: Never  Vaping Use   Vaping Use: Never used  Substance and Sexual Activity   Alcohol use: No    Alcohol/week: 0.0 standard drinks   Drug use: No   Sexual activity: Never    Birth control/protection: Post-menopausal, Surgical    Comment: 1st intercourse 61 yo-5 partners  Other Topics Concern   Not on file  Social History Narrative   Caffeine 1 cup coffee in am.   Social Determinants of Health   Financial Resource Strain: Not on file  Food Insecurity: Not on file  Transportation Needs: Not on file  Physical Activity: Not on file  Stress: Not on file  Social Connections: Not on file  Intimate Partner Violence: Not on file     PHYSICAL EXAM  Vitals:   09/11/21 1305  BP: 116/82  Pulse: 73  SpO2: 99%  Weight: 153 lb 8 oz (69.6 kg)  Height: 5' 4"  (1.626 m)   Body mass index is 26.35 kg/m.  Generalized: Well developed, in no acute distress  Cardiology: normal rate and rhythm, no murmur noted Respiratory: clear to auscultation bilaterally  Neurological examination  Mentation: Alert oriented to time, place, history taking. Follows all commands speech and language fluent Cranial nerve II-XII: Pupils were equal round reactive to light. Extraocular movements were full, visual field were full  Motor: The motor testing reveals 5 over 5 strength of all 4 extremities. Good symmetric motor tone is noted throughout.  Gait and station: Gait is normal.    DIAGNOSTIC DATA (LABS, IMAGING, TESTING) - I reviewed patient records, labs, notes, testing and imaging myself where available.  MMSE - Mini Mental State Exam 09/11/2021 03/05/2017 06/19/2016  Orientation to time 5  4 4   Orientation to Place 5 5 5   Registration 3 3 3   Attention/ Calculation 0 0 2  Recall 2 1 2   Language- name 2 objects 2 2 2   Language- repeat 0 0 0  Language- follow 3 step command 2 3 3   Language- read & follow direction 1 1 1   Write a sentence 1 0 0  Copy design 0 1 0  Total score 21 20 22      Lab Results  Component Value Date   WBC 4.8 04/05/2020   HGB 12.4 04/05/2020   HCT 38.5 04/05/2020   MCV 87.7 04/05/2020   PLT 223.0 04/05/2020      Component Value Date/Time   NA 144 11/25/2020 1040   K 4.2 11/25/2020 1040   CL 104 11/25/2020 1040   CO2 24 11/25/2020 1040   GLUCOSE 86 11/25/2020 1040   GLUCOSE 89 02/07/2017 1100   BUN 11 11/25/2020 1040   CREATININE 0.71 11/25/2020 1040   CREATININE 0.68 04/27/2016 1558   CALCIUM 9.4 11/25/2020 1040   PROT 6.6 02/23/2020 1123   ALBUMIN 4.3 02/23/2020 1123   AST 20 02/23/2020 1123   ALT 19 02/23/2020 1123   ALKPHOS 106 02/23/2020 1123   BILITOT 0.3 02/23/2020 1123   GFRNONAA 94 02/23/2020 1123   GFRNONAA >89 09/10/2013 1136   GFRAA 108 02/23/2020 1123   GFRAA >89 09/10/2013 1136   Lab Results  Component Value Date   CHOL 243 (H) 11/25/2020   HDL 58 11/25/2020   LDLCALC 164 (H) 11/25/2020   LDLDIRECT 136 (H) 08/09/2008   TRIG 118 11/25/2020   CHOLHDL 4.2 11/25/2020   Lab Results  Component Value Date   HGBA1C 5.6 06/18/2017   No results found for: VITAMINB12 Lab Results  Component Value Date   TSH 1.400 02/23/2020     ASSESSMENT AND PLAN 66 y.o. year old female  has a past medical history of Abdominal pain, left lower quadrant (11/08/2015), Abnormal Pap smear (2011), Anxiety, Arthritis, Bronchitis, Chronic diastolic heart failure ([place]), Cognitive decline, FIBROIDS, UTERUS (07/02/2009), GERD (gastroesophageal reflux disease), Hyperlipidemia, Hypersomnia, persistent (11/05/2017), Hypertension, Left shoulder pain (02/14/2016), Narcolepsy, Osteopenia (04/2019), Premature atrial contractions, PVC's (premature  ventricular contractions), Seasonal allergies, SVD (spontaneous vaginal delivery), and Weight gain (01/19/2021). here with     ICD-10-CM   1. OSA on CPAP  G47.33    Z99.89     2. Narcolepsy and cataplexy  G47.411     3. Memory loss  R41.3         Kathryn Hendricks continues to have difficulty with CPAP compliance. AHI is 9 at current setting. Central events > obstructive. I have encouraged her to focus on using CPAP every night. I feel using CPAP consistently could help with burning sensation. Aquaphor could also help if nose if dry. Compliance report reveals sub optimal compliance. She was encouraged to continue using CPAP nightly and for greater than 4 hours each night. We will update supply orders as indicated. Risks of untreated sleep apnea review and education materials provided. She will continue Vyvanse 70m daily. May consider talking with PCP if she wishes to have additional testing for ADD. We have discussed MMSE. Stable from 2018. She is completely oriented and able to perform recall tasks. Serial subtractions are difficult as well as following instructions. She can complete tasks but held paper with the wrong hand. Attention deficit as well as intellectual disability may contribute. Formal neurocognitive testing discussed but she is not interested at this time.  She will monitor mood closely and follow up with PCP for any worsening depression. Healthy lifestyle habits encouraged. She will follow up in 3-4 months with Dr Brett Fairy to assist with CPAP settings versus titration study pending compliance review. She verbalizes understanding and agreement with this plan.    No orders of the defined types were placed in this encounter.    No orders of the defined types were placed in this encounter.    I spent 30 minutes of face-to-face and non-face-to-face time with patient.  This included previsit chart review, lab review, study review, order entry, electronic health record documentation,  patient education.   Debbora Presto, FNP-C 09/11/2021, 4:24 PM [facility] [address] [place], [place] [address] [phone]

## 2021-09-11 NOTE — Patient Instructions (Addendum)
Please continue using your CPAP regularly. While your insurance requires that you use CPAP at least 4 hours each night on 70% of the nights, I recommend, that you not skip any nights and use it throughout the night if you can. Getting used to CPAP and staying with the treatment long term does take time and patience and discipline. Untreated obstructive sleep apnea when it is moderate to severe can have an adverse impact on cardiovascular health and raise her risk for heart disease, arrhythmias, hypertension, congestive heart failure, stroke and diabetes. Untreated obstructive sleep apnea causes sleep disruption, nonrestorative sleep, and sleep deprivation. This can have an impact on your day to day functioning and cause daytime sleepiness and impairment of cognitive function, memory loss, mood disturbance, and problems focussing. Using CPAP regularly can improve these symptoms.  Continue Vyvanse 70mg  daily. Continue close follow up with your PCP if depression concerns worsen or do not improve. Consider formal neurocognitive testing if memory worsens.   Please work on using CPAP every night. I am going to ask Dr Brett Fairy to help [place] manage your CPAP setting pending improved compliance.   Management of Memory Problems   There are some general things you can do to help manage your memory problems.  Your memory may not in fact recover, but by using techniques and strategies you will be able to manage your memory difficulties better.   1)  Establish a routine. Try to establish and then stick to a regular routine.  By doing this, you will get used to what to expect and you will reduce the need to rely on your memory.  Also, try to do things at the same time of day, such as taking your medication or checking your calendar first thing in the morning. Think about think that you can do as a part of a regular routine and make a list.  Then enter them into a daily planner to remind you.  This will help you establish  a routine.   2)  Organize your environment. Organize your environment so that it is uncluttered.  Decrease visual stimulation.  Place everyday items such as keys or cell phone in the same place every day (ie.  Basket next to front door) Use post it notes with a brief message to yourself (ie. Turn off light, lock the door) Use labels to indicate where things go (ie. Which cupboards are for food, dishes, etc.) Keep a notepad and pen by the telephone to take messages   3)  Memory Aids A diary or journal/notebook/daily planner Making a list (shopping list, chore list, to do list that needs to be done) Using an alarm as a reminder (kitchen timer or cell phone alarm) Using cell phone to store information (Notes, Calendar, Reminders) Calendar/White board placed in a prominent position Post-it notes   In order for memory aids to be useful, you need to have good habits.  It's no good remembering to make a note in your journal if you don't remember to look in it.  Try setting aside a certain time of day to look in journal.   4)  Improving mood and managing fatigue. There may be other factors that contribute to memory difficulties.  Factors, such as anxiety, depression and tiredness can affect memory. Regular gentle exercise can help improve your mood and give you more energy. Simple relaxation techniques may help relieve symptoms of anxiety Try to get back to completing activities or hobbies you enjoyed doing in the past. Learn  to pace yourself through activities to decrease fatigue. Find out about some local support groups where you can share experiences with others. Try and achieve 7-8 hours of sleep at night.

## 2021-10-10 ENCOUNTER — Telehealth: Payer: Self-pay | Admitting: Family Medicine

## 2021-10-10 ENCOUNTER — Other Ambulatory Visit (HOSPITAL_COMMUNITY): Payer: Self-pay

## 2021-10-10 ENCOUNTER — Other Ambulatory Visit: Payer: Self-pay | Admitting: Pulmonary Disease

## 2021-10-10 NOTE — Telephone Encounter (Signed)
Called pt to further discus. Sleeping issues have been going on for 1-2 months now. Takes Vyvanse early in the morning around 730/8am. However, she is still up all night. Does not go to sleep until 7am some days. Pt states she is not tired, cannot fall asleep. Sometimes, when she puts on CPAP machine, keeps her awake. She is sometimes congested after using CPAP machine the next day. Confirmed she is cleaning regularly (every 2 days). Nose dries out, seems like too much air pressure present, which wakes her up. Puts mouth strap on which helps some. Wakes She feels she is having to constantly blow nose after CPAP use. Uses OTC nasal spray.  Does not take any OTC meds to help w/ sleep. Has never in the past. Falling asleep during the daytime about 2-3 times per day and having fatigue because of this. I reviewed recent CPAP report. Aware compliance below optimal range. AHI elevated. She should try to be using it at least 4 hours or more each night. Aware I will have NP review to see if any changes need to be made.  Last 2-3 months, feeling down. Does not want to do anything/socialize. Feels a little depressed. Advised her to f/u with PCP on this. She verbalized understanding.   Unable to do job d/t injury to shoulder. Not working right now.  She also brought up continued memory issues. Offered referral to neurocognitive testing, she declined, states she does not have funds to do this right now.  Aware I will send to Amy to review and call back w/ recommendation.  Next appt 12/11/21 at 2:30p. Pt verbalized understanding.

## 2021-10-10 NOTE — Telephone Encounter (Signed)
Pt is asking for a call to discuss her not being able to sleep, pt states she was up until 6:30-7:00 this morning, please call.

## 2021-10-10 NOTE — Telephone Encounter (Signed)
Called and spoke with pt. Confirmed she is taking flonase qd. Pulmonologist prescribed this as well as Atrovent. She is not taking Atrovent. States she was told she could take together prn. She will try this. I explained below steps to reset sleep schedule. She is agreeable to try this. She will also try to be more compliant with CPAP machine. If these recommendations do not help, she will call back. She will continue Vyvanse as prescribed for narcolepsy.

## 2021-10-11 ENCOUNTER — Other Ambulatory Visit (HOSPITAL_COMMUNITY): Payer: Self-pay

## 2021-10-11 NOTE — Progress Notes (Signed)
66 y.o. [id number] Divorced Black or African American Not Hispanic or Latino female here for annual exam.  H/O TVH/BSO, A&P in 2018.  Not sexually active. No vaginal bleeding.   OAB, controlled on myrbetriq  She has mild GSI.     No LMP recorded. Patient has had a hysterectomy.          Sexually active: No.  The current method of family planning is status post hysterectomy.    Exercising: No.  The patient does not participate in regular exercise at present. Smoker:  no  Health Maintenance: Pap:  11/12/19 WNL  History of abnormal Pap:  no MMG:  03/17/21 density B Bi-rads 1 neg;  02/19/20 density B Bi-rads 1 neg  BMD:   05/19/19 osteopenia,  T score -1.7 AP spine.  FRAX 3.4% / 0.2%. Colonoscopy: 06/10/20 f/u 5 years (FH colon cancer) TDaP:  06/14/17  Gardasil: n/a   reports that she has never smoked. She has never used smokeless tobacco. She reports that she does not drink alcohol and does not use drugs. 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 3 kids, 10 grandchildren. Everyone is local.   Past Medical History:  Diagnosis Date   Abdominal pain, left lower quadrant 11/08/2015   Abnormal Pap smear 2011   Anxiety    Arthritis    lower back and shoulders   Bronchitis    hx - used abluterol inhaler   Chronic diastolic heart failure (HCC)    Cognitive decline    FIBROIDS, UTERUS 07/02/2009   Qualifier: Diagnosis of  By: Earley Favor MD, Cat     GERD (gastroesophageal reflux disease)    occasional   Hyperlipidemia    Hypersomnia, persistent 11/05/2017   Hypertension    Left shoulder pain 02/14/2016   Narcolepsy    Osteopenia 04/2019   T score -1.7 FRAX 3.4% / 0.2%   Premature atrial contractions    PVC's (premature ventricular contractions)    Seasonal allergies    SVD (spontaneous vaginal delivery)    x 3   Weight gain 01/19/2021    Past Surgical History:  Procedure Laterality Date   ANTERIOR AND POSTERIOR REPAIR N/A 02/19/2017   Procedure: ANTERIOR (CYSTOCELE);  Surgeon: Bjorn Loser, MD;  Location: [facility];  Service: Urology;  Laterality: N/A;   APPENDECTOMY     COLONOSCOPY     CYSTOSCOPY N/A 02/19/2017   Procedure: CYSTOSCOPY;  Surgeon: Bjorn Loser, MD;  Location: [facility];  Service: Urology;  Laterality: N/A;   DILATION AND CURETTAGE OF UTERUS  2010   HYSTEROSCOPY W/ ENDOMETRIAL ABLATION  2010   SALPINGOOPHORECTOMY Bilateral 02/19/2017   Procedure: SALPINGO OOPHORECTOMY;  Surgeon: Anastasio Auerbach, MD;  Location: [facility];  Service: Gynecology;  Laterality: Bilateral;   TUBAL LIGATION     VAGINAL HYSTERECTOMY N/A 02/19/2017   Procedure: HYSTERECTOMY VAGINAL ,  BSO.;  Surgeon: Anastasio Auerbach, MD;  Location: [facility];  Service: Gynecology;  Laterality: N/A;  DO NOT OPEN LAP INSTRUMENTS   WISDOM TOOTH EXTRACTION      Current Outpatient Medications  Medication Sig Dispense Refill   acetaminophen (TYLENOL) 500 MG tablet Take 500 mg by mouth 2 (two) times daily as needed.     albuterol (VENTOLIN HFA) 108 (90 Base) MCG/ACT inhaler Inhale 2 puffs into the lungs every 6 (six) hours as needed for wheezing or shortness of breath. 18 g 1   fluticasone (FLONASE) 50 MCG/ACT nasal spray Place into both nostrils daily.     ipratropium (ATROVENT) 0.03 %  nasal spray Place 2 sprays into both nostrils 4 (four) times daily.     lisdexamfetamine (VYVANSE) 70 MG capsule Take 1 capsule (70 mg total) by mouth daily. 30 capsule 0   lisinopril (ZESTRIL) 20 MG tablet Take 1 tablet (20 mg total) by mouth at bedtime. 90 tablet 1   mirabegron ER (MYRBETRIQ) 50 MG TB24 tablet Take 1 tablet (50 mg total) by mouth daily. 30 tablet 11   rosuvastatin (CRESTOR) 10 MG tablet Take 1 tablet (10 mg total) by mouth daily. 90 tablet 1   No current facility-administered medications for this visit.    Family History  Problem Relation Age of Onset   Hypertension Mother    Diabetes Mother    Stroke Mother    CAD Mother 79   Narcolepsy Mother        not diagnosed   Hypertension Sister     Cancer Sister        Colon cancer   Hypertension Brother    Cancer Brother        throat   Diabetes Father    Heart disease Father     Review of Systems  All other systems reviewed and are negative.  Exam:   BP 128/72    Pulse 66    Ht 5' 4.5" (1.638 m)    Wt 154 lb (69.9 kg)    SpO2 100%    BMI 26.03 kg/m   Weight change: @WEIGHTCHANGE @ Height:   Height: 5' 4.5" (163.8 cm)  Ht Readings from Last 3 Encounters:  10/19/21 5' 4.5" (1.638 m)  09/11/21 5\' 4"  (1.626 m)  06/06/21 5\' 4"  (1.626 m)    General appearance: alert, cooperative and appears stated age Head: Normocephalic, without obvious abnormality, atraumatic Neck: no adenopathy, supple, symmetrical, trachea midline and thyroid normal to inspection and palpation Lungs: clear to auscultation bilaterally Cardiovascular: regular rate and rhythm Breasts: normal appearance, no masses or tenderness Abdomen: soft, non-tender; non distended,  no masses,  no organomegaly Extremities: extremities normal, atraumatic, no cyanosis or edema Skin: Skin color, texture, turgor normal. No rashes or lesions Lymph nodes: Cervical, supraclavicular, and axillary nodes normal. No abnormal inguinal nodes palpated Neurologic: Grossly normal   Pelvic: External genitalia:  no lesions              Urethra:  normal appearing urethra with no masses, tenderness or lesions              Bartholins and Skenes: normal                 Vagina: mildly atrophic appearing vagina with normal color and discharge, no lesions. Grade 1-2 cystocele with valsalva              Cervix: absent               Bimanual Exam:  Uterus:  uterus absent              Adnexa: no mass, fullness, tenderness               Rectovaginal: Confirms               Anus:  normal sphincter tone, no lesions  Lovena Le, CMA chaperoned for the exam.  1. Well woman exam Discussed breast self exam Discussed calcium and vit D intake Mammogram and colonoscopy are UTD  2. Osteopenia,  unspecified location - DG Bone Density; Future  3. Hypoestrogenism - DG Bone Density; Future  4. OAB (overactive bladder)  Controlled with medication - mirabegron ER (MYRBETRIQ) 50 MG TB24 tablet; Take 1 tablet (50 mg total) by mouth daily.  Dispense: 90 tablet; Refill: 3

## 2021-10-19 ENCOUNTER — Encounter: Payer: Self-pay | Admitting: Pulmonary Disease

## 2021-10-19 ENCOUNTER — Ambulatory Visit (INDEPENDENT_AMBULATORY_CARE_PROVIDER_SITE_OTHER): Payer: Self-pay | Admitting: Pulmonary Disease

## 2021-10-19 ENCOUNTER — Other Ambulatory Visit: Payer: Self-pay

## 2021-10-19 ENCOUNTER — Other Ambulatory Visit (HOSPITAL_COMMUNITY): Payer: Self-pay

## 2021-10-19 ENCOUNTER — Encounter: Payer: Self-pay | Admitting: Obstetrics and Gynecology

## 2021-10-19 ENCOUNTER — Ambulatory Visit (INDEPENDENT_AMBULATORY_CARE_PROVIDER_SITE_OTHER): Payer: 59 | Admitting: Obstetrics and Gynecology

## 2021-10-19 VITALS — BP 118/80 | HR 68 | Ht 64.5 in | Wt 154.0 lb

## 2021-10-19 VITALS — BP 128/72 | HR 66 | Ht 64.5 in | Wt 154.0 lb

## 2021-10-19 DIAGNOSIS — N3281 Overactive bladder: Secondary | ICD-10-CM | POA: Diagnosis not present

## 2021-10-19 DIAGNOSIS — E2839 Other primary ovarian failure: Secondary | ICD-10-CM | POA: Diagnosis not present

## 2021-10-19 DIAGNOSIS — J4520 Mild intermittent asthma, uncomplicated: Secondary | ICD-10-CM

## 2021-10-19 DIAGNOSIS — Z01419 Encounter for gynecological examination (general) (routine) without abnormal findings: Secondary | ICD-10-CM

## 2021-10-19 DIAGNOSIS — M8580 Other specified disorders of bone density and structure, unspecified site: Secondary | ICD-10-CM

## 2021-10-19 DIAGNOSIS — J310 Chronic rhinitis: Secondary | ICD-10-CM

## 2021-10-19 MED ORDER — MIRABEGRON ER 50 MG PO TB24
50.0000 mg | ORAL_TABLET | Freq: Every day | ORAL | 3 refills | Status: DC
  Filled 2021-10-19: qty 90, 90d supply, fill #0
  Filled 2021-10-20: qty 30, 30d supply, fill #0
  Filled 2022-01-19: qty 30, 30d supply, fill #1
  Filled 2022-03-20: qty 30, 30d supply, fill #2
  Filled 2022-05-02 – 2022-08-28 (×6): qty 30, 30d supply, fill #3
  Filled 2022-10-02 – 2022-10-15 (×2): qty 30, 30d supply, fill #4

## 2021-10-19 MED ORDER — MONTELUKAST SODIUM 10 MG PO TABS
10.0000 mg | ORAL_TABLET | Freq: Every day | ORAL | 11 refills | Status: AC
  Filled 2021-10-19: qty 30, 30d supply, fill #0

## 2021-10-19 MED ORDER — FLUTICASONE PROPIONATE 50 MCG/ACT NA SUSP
1.0000 | Freq: Every day | NASAL | 11 refills | Status: AC
  Filled 2021-10-19: qty 16, 30d supply, fill #0

## 2021-10-19 MED ORDER — FLUTICASONE PROPIONATE HFA 110 MCG/ACT IN AERO
2.0000 | INHALATION_SPRAY | Freq: Two times a day (BID) | RESPIRATORY_TRACT | 12 refills | Status: DC | PRN
  Filled 2021-10-19: qty 12, 30d supply, fill #0

## 2021-10-19 NOTE — Patient Instructions (Signed)

## 2021-10-19 NOTE — Progress Notes (Signed)
Synopsis: Referred in August 2021 for Shortness of breath  Subjective:   PATIENT ID: Kathryn Hendricks GENDER: female DOB: December 08, 1955, MRN: [id number]   HPI  Chief Complaint  Patient presents with   Follow-up    F/U on SOB. States her allergies have bothering her, causing increased SOB. Has not used Flonase due to not having a RX for it.    Kathryn Hendricks is a 66 year old woman, never smoker with history of narcolepsy, obstructive sleep apnea, heart failure preserved EF, hypertension and hyperlipidemia who returns to pulmonary clinic for asthma follow-up.  She has been doing well since last visit.  She has been using albuterol as needed.  She recently started having an increased shortness of breath due to spring allergens that are arising.  She denies any cough or wheezing.  She has been using Flonase nasal spray which has helped with her sinus congestion and drainage.  Pulmonary function testing from November 2021 was within normal limits.  Past Medical History:  Diagnosis Date   Abdominal pain, left lower quadrant 11/08/2015   Abnormal Pap smear 2011   Anxiety    Arthritis    lower back and shoulders   Bronchitis    hx - used abluterol inhaler   Chronic diastolic heart failure (HCC)    Cognitive decline    FIBROIDS, UTERUS 07/02/2009   Qualifier: Diagnosis of  By: Ta MD, Cat     GERD (gastroesophageal reflux disease)    occasional   Hyperlipidemia    Hypersomnia, persistent 11/05/2017   Hypertension    Left shoulder pain 02/14/2016   Narcolepsy    Osteopenia 04/2019   T score -1.7 FRAX 3.4% / 0.2%   Premature atrial contractions    PVC's (premature ventricular contractions)    Seasonal allergies    SVD (spontaneous vaginal delivery)    x 3   Weight gain 01/19/2021     Family History  Problem Relation Age of Onset   Hypertension Mother    Diabetes Mother    Stroke Mother    CAD Mother 82   Narcolepsy Mother        not diagnosed   Hypertension Sister    Cancer  Sister        Colon cancer   Hypertension Brother    Cancer Brother        throat   Diabetes Father    Heart disease Father      Social History   Socioeconomic History   Marital status: Divorced    Spouse name: Not on file   Number of children: 3   Years of education: 12   Highest education level: Not on file  Occupational History   Occupation: Microbiologist: [employer]  Tobacco Use   Smoking status: Never   Smokeless tobacco: Never  Vaping Use   Vaping Use: Never used  Substance and Sexual Activity   Alcohol use: No    Alcohol/week: 0.0 standard drinks   Drug use: No   Sexual activity: Never    Birth control/protection: Post-menopausal, Surgical    Comment: 1st intercourse 19 yo-5 partners  Other Topics Concern   Not on file  Social History Narrative   Caffeine 1 cup coffee in am.   Social Determinants of Health   Financial Resource Strain: Not on file  Food Insecurity: Not on file  Transportation Needs: Not on file  Physical Activity: Not on file  Stress: Not on file  Social Connections: Not  on file  Intimate Partner Violence: Not on file     Allergies  Allergen Reactions   Penicillins Shortness Of Breath, Itching and Swelling    Has patient had a PCN reaction causing immediate rash, facial/tongue/throat swelling, SOB or lightheadedness with hypotension: Yes Has patient had a PCN reaction causing severe rash involving mucus membranes or skin necrosis: No Has patient had a PCN reaction that required hospitalization: No Has patient had a PCN reaction occurring within the last 10 years: No If all of the above answers are "NO", then may proceed with Cephalosporin use.      Outpatient Medications Prior to Visit  Medication Sig Dispense Refill   acetaminophen (TYLENOL) 500 MG tablet Take 500 mg by mouth 2 (two) times daily as needed.     albuterol (VENTOLIN HFA) 108 (90 Base) MCG/ACT inhaler Inhale 2 puffs into the lungs every 6 (six) hours as  needed for wheezing or shortness of breath. 18 g 1   ipratropium (ATROVENT) 0.03 % nasal spray Place 2 sprays into both nostrils 4 (four) times daily.     lisdexamfetamine (VYVANSE) 70 MG capsule Take 1 capsule (70 mg total) by mouth daily. 30 capsule 0   lisinopril (ZESTRIL) 20 MG tablet Take 1 tablet (20 mg total) by mouth at bedtime. 90 tablet 1   mirabegron ER (MYRBETRIQ) 50 MG TB24 tablet Take 1 tablet (50 mg total) by mouth daily. 90 tablet 3   rosuvastatin (CRESTOR) 10 MG tablet Take 1 tablet (10 mg total) by mouth daily. 90 tablet 1   fluticasone (FLONASE) 50 MCG/ACT nasal spray Place into both nostrils daily.     No facility-administered medications prior to visit.    Review of Systems  Constitutional:  Negative for chills, fever, malaise/fatigue and weight loss.  HENT:  Positive for congestion. Negative for sinus pain and sore throat.   Eyes: Negative.   Respiratory:  Positive for shortness of breath. Negative for cough, hemoptysis, sputum production and wheezing.   Cardiovascular:  Negative for chest pain, palpitations, orthopnea, claudication and leg swelling.  Gastrointestinal:  Negative for abdominal pain, heartburn, nausea and vomiting.  Genitourinary: Negative.   Musculoskeletal:  Negative for joint pain and myalgias.  Skin:  Negative for rash.  Neurological:  Negative for weakness.  Endo/Heme/Allergies: Negative.   Psychiatric/Behavioral: Negative.       Objective:   Vitals:   10/19/21 1454  BP: 118/80  Pulse: 68  SpO2: 98%  Weight: 154 lb (69.9 kg)  Height: 5' 4.5" (1.638 m)     Physical Exam Constitutional:      General: She is not in acute distress.    Appearance: She is not ill-appearing.  HENT:     Head: Normocephalic and atraumatic.  Eyes:     General: No scleral icterus.    Conjunctiva/sclera: Conjunctivae normal.  Cardiovascular:     Rate and Rhythm: Normal rate and regular rhythm.     Pulses: Normal pulses.     Heart sounds: Normal heart  sounds. No murmur heard. Pulmonary:     Effort: Pulmonary effort is normal.     Breath sounds: Normal breath sounds. No wheezing, rhonchi or rales.  Musculoskeletal:     Right lower leg: No edema.     Left lower leg: No edema.  Skin:    General: Skin is warm and dry.  Neurological:     General: No focal deficit present.     Mental Status: She is alert.  Psychiatric:  Mood and Affect: Mood normal.        Behavior: Behavior normal.        Thought Content: Thought content normal.        Judgment: Judgment normal.   CBC    Component Value Date/Time   WBC 4.8 04/05/2020 1155   RBC 4.39 04/05/2020 1155   HGB 12.4 04/05/2020 1155   HGB 12.2 02/23/2020 1123   HCT 38.5 04/05/2020 1155   HCT 38.3 02/23/2020 1123   PLT 223.0 04/05/2020 1155   PLT 215 02/23/2020 1123   MCV 87.7 04/05/2020 1155   MCV 89 02/23/2020 1123   MCH 28.4 02/23/2020 1123   MCH 28.6 02/26/2017 1446   MCHC 32.2 04/05/2020 1155   RDW 14.6 04/05/2020 1155   RDW 13.1 02/23/2020 1123   LYMPHSABS 2.4 04/05/2020 1155   MONOABS 0.4 04/05/2020 1155   EOSABS 0.2 04/05/2020 1155   BASOSABS 0.1 04/05/2020 1155   BMP Latest Ref Rng & Units 11/25/2020 02/23/2020 03/10/2019  Glucose 65 - 99 mg/dL 86 91 86  BUN 8 - 27 mg/dL 11 10 17   Creatinine 0.57 - 1.00 mg/dL 0.71 0.69 0.66  BUN/Creat Ratio 12 - 28 15 14 26   Sodium 134 - 144 mmol/L 144 141 143  Potassium 3.5 - 5.2 mmol/L 4.2 4.3 4.8  Chloride 96 - 106 mmol/L 104 106 104  CO2 20 - 29 mmol/L 24 27 25   Calcium 8.7 - 10.3 mg/dL 9.4 9.2 9.4   Chest imaging: CXR 03/23/20 The cardiac silhouette, mediastinal and hilar contours are within normal limits. The lungs are clear. No pleural effusions or pulmonary lesions. The bony thorax is intact.  V/Q Scan 03/23/20 Normal perfusion lung scan.  No findings for pulmonary embolism.  PFT: PFT Results Latest Ref Rng & Units 07/12/2020  FVC-Pre L 2.75  FVC-Predicted Pre % 105  FVC-Post L 2.63  FVC-Predicted Post % 100   Pre FEV1/FVC % % 79  Post FEV1/FCV % % 84  FEV1-Pre L 2.17  FEV1-Predicted Pre % 106  FEV1-Post L 2.21  DLCO uncorrected ml/min/mmHg 18.29  DLCO UNC% % 91  DLCO corrected ml/min/mmHg 18.29  DLCO COR %Predicted % 91  DLVA Predicted % 109  TLC L 4.53  TLC % Predicted % 89  RV % Predicted % 85    Labs:  Path:  Echo 03/17/2020: LVEF 65 to 70%.  RV systolic function is normal.  RV size is normal.  RVSP estimated 41.6 mmHg.  Heart Catheterization:  Assessment & Plan:   Chronic rhinitis - Plan: fluticasone (FLONASE) 50 MCG/ACT nasal spray  Mild intermittent asthma without complication - Plan: fluticasone (FLOVENT HFA) 110 MCG/ACT inhaler  Discussion: Kathryn Hendricks is a 66 year old woman, never smoker with history of narcolepsy, obstructive sleep apnea, heart failure preserved EF, hypertension and hyperlipidemia who returns to pulmonary clinic for asthma follow-up.  She is having increased shortness of breath with the start of spring allergy season.  She is to continue taking Claritin 10 mg daily.  We will start her on montelukast 10 mg daily.  She is to continue fluticasone nasal spray 1 to 2 sprays per nostril daily.  We will also send in prescription for Flovent 110 mcg 2 puffs twice daily as needed for shortness of breath, cough, wheezing or chest tightness.  She also has albuterol inhaler which she can use as needed.  Follow-up in 1 year.  Freda Jackson, MD [facility] Office: [phone]   Current Outpatient Medications:  acetaminophen (TYLENOL) 500 MG tablet, Take 500 mg by mouth 2 (two) times daily as needed., Disp: , Rfl:    albuterol (VENTOLIN HFA) 108 (90 Base) MCG/ACT inhaler, Inhale 2 puffs into the lungs every 6 (six) hours as needed for wheezing or shortness of breath., Disp: 18 g, Rfl: 1   fluticasone (FLOVENT HFA) 110 MCG/ACT inhaler, Inhale 2 puffs into the lungs 2 (two) times daily as needed., Disp: 12 g, Rfl: 12   ipratropium  (ATROVENT) 0.03 % nasal spray, Place 2 sprays into both nostrils 4 (four) times daily., Disp: , Rfl:    lisdexamfetamine (VYVANSE) 70 MG capsule, Take 1 capsule (70 mg total) by mouth daily., Disp: 30 capsule, Rfl: 0   lisinopril (ZESTRIL) 20 MG tablet, Take 1 tablet (20 mg total) by mouth at bedtime., Disp: 90 tablet, Rfl: 1   mirabegron ER (MYRBETRIQ) 50 MG TB24 tablet, Take 1 tablet (50 mg total) by mouth daily., Disp: 90 tablet, Rfl: 3   rosuvastatin (CRESTOR) 10 MG tablet, Take 1 tablet (10 mg total) by mouth daily., Disp: 90 tablet, Rfl: 1   fluticasone (FLONASE) 50 MCG/ACT nasal spray, Place 1-2 sprays into both nostrils daily., Disp: 16 g, Rfl: 11

## 2021-10-19 NOTE — Patient Instructions (Signed)
Use flovent 169mcg 2 puffs twice daily as needed for shortness of breath - rinse mouth out after each use  Use fluticasone nasal spray, 1-2 sprays per nostril daily  Use claritin 10mg  daily for allergies  Use albuterol inhaler as needed

## 2021-10-20 ENCOUNTER — Other Ambulatory Visit (HOSPITAL_COMMUNITY): Payer: Self-pay

## 2021-10-23 ENCOUNTER — Other Ambulatory Visit: Payer: Self-pay | Admitting: Neurology

## 2021-10-23 ENCOUNTER — Other Ambulatory Visit (HOSPITAL_COMMUNITY): Payer: Self-pay

## 2021-10-23 MED ORDER — LISDEXAMFETAMINE DIMESYLATE 70 MG PO CAPS
70.0000 mg | ORAL_CAPSULE | Freq: Every day | ORAL | 0 refills | Status: DC
  Filled 2021-10-23: qty 30, 30d supply, fill #0

## 2021-10-23 NOTE — Telephone Encounter (Signed)
Controlled Drug Registry checked:  Vyvanse 70mg  last filled on 08/31/21 for #30.  Last visit: 09/01/2021 Next visit: 12/11/2021

## 2021-10-24 DIAGNOSIS — Z0271 Encounter for disability determination: Secondary | ICD-10-CM

## 2021-10-26 ENCOUNTER — Other Ambulatory Visit (HOSPITAL_COMMUNITY): Payer: Self-pay

## 2021-11-28 DIAGNOSIS — Z0289 Encounter for other administrative examinations: Secondary | ICD-10-CM

## 2021-12-04 ENCOUNTER — Other Ambulatory Visit (HOSPITAL_COMMUNITY): Payer: Self-pay

## 2021-12-04 ENCOUNTER — Other Ambulatory Visit: Payer: Self-pay | Admitting: Family Medicine

## 2021-12-05 ENCOUNTER — Other Ambulatory Visit (HOSPITAL_COMMUNITY): Payer: Self-pay

## 2021-12-05 MED ORDER — LISDEXAMFETAMINE DIMESYLATE 70 MG PO CAPS
70.0000 mg | ORAL_CAPSULE | Freq: Every day | ORAL | 0 refills | Status: DC
  Filled 2021-12-05: qty 30, 30d supply, fill #0

## 2021-12-05 NOTE — Telephone Encounter (Signed)
Last OV was on 09/11/21.  ?Next OV is scheduled for 12/11/21.  ?Last RX was written on 10/23/21 for 30 tabs.  ? ?[place] Drug Database has been reviewed. ? ?

## 2021-12-11 ENCOUNTER — Encounter: Payer: Self-pay | Admitting: Family Medicine

## 2021-12-11 ENCOUNTER — Encounter: Payer: Self-pay | Admitting: Neurology

## 2021-12-11 ENCOUNTER — Ambulatory Visit: Payer: 59 | Admitting: Neurology

## 2021-12-11 VITALS — BP 154/86 | HR 75 | Ht 64.5 in | Wt 158.0 lb

## 2021-12-11 DIAGNOSIS — G4710 Hypersomnia, unspecified: Secondary | ICD-10-CM

## 2021-12-11 DIAGNOSIS — R413 Other amnesia: Secondary | ICD-10-CM | POA: Diagnosis not present

## 2021-12-11 DIAGNOSIS — G4719 Other hypersomnia: Secondary | ICD-10-CM | POA: Diagnosis not present

## 2021-12-11 DIAGNOSIS — G47411 Narcolepsy with cataplexy: Secondary | ICD-10-CM | POA: Diagnosis not present

## 2021-12-11 DIAGNOSIS — Z91199 Patient's noncompliance with other medical treatment and regimen due to unspecified reason: Secondary | ICD-10-CM

## 2021-12-11 DIAGNOSIS — R41840 Attention and concentration deficit: Secondary | ICD-10-CM

## 2021-12-11 NOTE — Progress Notes (Signed)
? ? ? ? ?NEUROLOGY SLEEP CLINIC ? ? ?PATIENT: Kathryn Hendricks ?DOB: [birth year] ? ?REASON FOR VISIT: follow up- narcolepsy, mild memory impairment, ADD ? ? PCP Dr. Gwendlyn Deutscher . ? ?HISTORY OF PRESENT ILLNESS: Narcolepsy - EDS, ADD, possible learning disability.  ?  ? ?RV 12-11-2021; ?Kathryn Hendricks:  ?Patient no longer gainfully employed, she lost her job de to sleepiness, was unable to perform at [employer] job. ?She reports still being kept awake by CPAP and is therefor not in compliance.  ?She also may need attention and neurocognitive testing: ?CPAP compliance on average is 2 hours and 6 minutes she has used the machine 35 out of 90 days and over 4 hours 28 out of 90 days.  Minimum pressure was 5 maximum pressure of 10 cm of water without EPR if she has the ability to change humidification herself, residual AHI was 7.2 more central than obstructive apneas remaining 95th percentile pressure was 9.8 cm water air leak at the 95th percentile were 14.6 L/min. ?Nasal pillows were used. She has claustrophobia.  ? ? ? ?Last seen by Debbora Presto, NP -  ?Kathryn Hendricks continues to have difficulty with CPAP compliance. AHI is 9 at current setting. Central events > obstructive. I have encouraged her to focus on using CPAP every night. I feel using CPAP consistently could help with burning sensation. Aquaphor could also help if nose if dry.  ?Compliance report reveals suboptimal compliance. She was encouraged to continue using CPAP nightly and for greater than 4 hours each night. We will update supply orders as indicated. Risks of untreated sleep apnea review and education materials provided. ? She will continue Vyvanse '70mg'$  daily. May consider talking with PCP if she wishes to have additional testing for ADD.  ?We have discussed MMSE. Stable from 2018. She is completely oriented and able to perform recall tasks. Serial subtractions are difficult as well as following instructions. ? She can complete tasks but held paper with the wrong  hand. Attention deficit as well as intellectual disability may contribute. ? Formal neurocognitive testing discussed but she is not interested at this time. ? She will monitor mood closely and follow up with PCP for any worsening depression. Healthy lifestyle habits encouraged.  ?She will follow up in 3-4 months with Dr Brett Fairy to assist with CPAP settings versus titration study pending compliance review. She verbalizes understanding and agreement with this plan.  ? ?RV : 03-08-2021:  ?Kathryn Hendricks reports that she has difficulties with her rotator cuff and biceps tendon she had a surgical repair but has not healed as she would like.  So she has been out of work for the last 6 months.  Her Epworth Sleepiness Scale has remained highly elevated at 17 out of 24 points and her explanation was that she cannot use her CPAP reliably.  Apparently it feels to her as if there is much too much pressure suddenly imposed on her.  So she has not been using it recently and it is waking her up.  The DME most telling her to bring in her CPAP for sleep for adjustments.  The patient reports a degree of sleepiness that may not all be up to obstructive sleep apnea but to her underlying condition of persistent hypersomnia.  The patient was diagnosed in November 2020 with sleep apnea it was a very mild sleep apnea with an AHI of 7.3/h the REM AHI was 19.4 and her sleep appeared fragmented snoring was present but oxygen saturation was stable  throughout the night.  I have ordered an auto titration CPAP between 5 and 12 cmH2O with 1 cm EPR and we had discussed alternatives I wonder if she may want to try a dental device given that her apnea is REM dependent.   ? ?Given her very high Epworth sleepiness scores and the difficulties with CPAP use it may be worse while reducing her AHI is somewhat rather than not treating it at all. ? ? ? ?12-10-2019: ?My pleasure of seeing Kathryn Hendricks , a 66 year old female patient with a history of narcolepsy  cataplexy who in 2016 had reported a feeling of cognitive decline, last time I saw her she had complained of waking up with headaches , sometimes feeling that her brain is foggy and she has a low monthly income that has kept her from obtaining a CPAP therapy.  She did test positive for obstructive sleep apnea however.  We may be able to furnish her with a refurbished CPAP machine.  ? She underwent a sleep study in November 2020 her apnea index was rather low at 7.3/h in rem sleep however she had an apnea index of 19.4 which makes her a CPAP candidate.  She did not have protracted and prolonged oxygen desaturation and she had rather bradycardic heart rates with a minimum of 47 bpm and a maximum of 85.  She slept mostly in supine position but her highest apnea index was when she slept on her left.  It appears that she is doing best if she is on her right side.  ? In addition she continues to endorse the Epworth sleepiness score at 20 and the fatigue severity score at 56 points both very highly elevated ratings.   ?Dr. Leta Baptist did a nerve conduction study with her in November 2020 needle examination of the left upper extremity was normal but she had bilateral mild median neuropathies consistent with carpal tunnel. ?She may treat this just with a soft brace - no surgery needed.  ?She is having surgery for rotator cuffs left over right symptoms there- she is left side dominant.  ? ? ?Rv 06-16-2019, patient referred for a question of numbness in the left hand middle-fingers and in toes of the left foot, sometimes having cramps. Not RLS.    ?Her Memory test was postponed during Covid, need to do this today.  ?She has been EDS again, can't find RITALIN of much help- shall try Sunosi.  ?.Epworth is today 15/24 points, GDS 1/ 15 points.  ? ?HISTORY: 05-08-2018, Kathryn Hendricks is a longtime patient of mine, she is meanwhile 66 years of age and she has ongoing difficulties with excessive daytime sleepiness.  She reports that  she sometimes has the irresistible urge to go to sleep just for a minute or 2 and these naps may only last a couple of minutes maximal 15 but are needed for her to face the next 2 hours of the day.  She needs these naps to recharge and short intervals.  Unfortunately this interferes with her social life and she has been found falling asleep in front of the grocery store when the rest of her family expected her to come in.  She has been treated with Adderall which has helped somewhat with sleepiness but has not had the long-lasting effect.  She has now developed trouble to sleep at night however- and we need to revisit the stimulant medication. Her daughter is very concerned. Epworth Sleepiness score endorsed at 20-24 points, FSS at 57/63 points, she  reports not feeling depressed, not having new medical problems or medication changes. ? ? ?11-05-2017.I have the pleasure to meet today with Kathryn Hendricks on 05 November 2017.  Kathryn Hendricks is a 66 year old African-American right-handed female with a diagnosis of narcolepsy, possibly cataplexy, and a concern of mild memory impairment.  We suspect that the patient had ADD which affected her ability to perform well in school and she may have a learning disability in addition.  She is treated currently with Adderall as a stimulant to keep her from falling asleep but this is not always working or not working long enough to allow her to participate in a full day's work.  It also leaves her still with a very high degree of daytime sleepiness, unsafe to operate machinery or drive.  Today's Epworth score was endorsed at 20 points out of 24 possible points, she has not yet taken medication for this day.  Her fatigue severity was endorsed at 52 points/ 63 possible points. 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  ?She also mentioned that she had just a 7th grandchild , 3 month ago.  ? ?Today 03/05/17, Kathryn Hendricks is a  66 year old female with a history of narcolepsy and mild memory impairment in ADD. She returns today for follow-up. She reports that she continues to take Adderall 10 mg daily. She does feel that it may not

## 2021-12-11 NOTE — Progress Notes (Signed)
Form completion request for rotation cuff tear. I have completed the form and placed in the fax box in the front office on 12/11/21 to be faxed to the [facility]. ?

## 2021-12-11 NOTE — Patient Instructions (Signed)
ASSESSMENT AND PLAN:  ?1.Narcolepsy poorly was controlled on Adderall, one a day 10 mg. No HTN resulting. persistent hypersomnia in narcolepsy. Cataplexy described as "dropping objects" when startled.  ?Sunosi caused tachycardia. Modafinil failed. Blood pressure rose.  ?No Atrial fibrillation was diagnosed- she has SVT - ?SVT and adderall are not a good mix,  ?She remains on VyNase- 70 mg, no side effects except trouble to fall asleep some nights. No caffeine.  ?Her Epworth score is too high to safely drive without medication.  ? ? ?2) OSA - we reset CPAP humidity and RAMP, it did not lead to improvement in compliance.  ?I cannot maintain CPAP prescription for her , supplies etc.  ? ?3) cognitive function testing- she was not referred by PCP, she stated- I will refer to neuropsychologist.  ? attention deficit testing , since her daughter has similar problems/symptoms and carries this diagnosis.  ? ?

## 2021-12-27 ENCOUNTER — Ambulatory Visit (INDEPENDENT_AMBULATORY_CARE_PROVIDER_SITE_OTHER): Payer: 59

## 2021-12-27 ENCOUNTER — Other Ambulatory Visit: Payer: Self-pay | Admitting: Obstetrics and Gynecology

## 2021-12-27 DIAGNOSIS — E2839 Other primary ovarian failure: Secondary | ICD-10-CM

## 2021-12-27 DIAGNOSIS — M8589 Other specified disorders of bone density and structure, multiple sites: Secondary | ICD-10-CM | POA: Diagnosis not present

## 2021-12-27 DIAGNOSIS — M8580 Other specified disorders of bone density and structure, unspecified site: Secondary | ICD-10-CM

## 2021-12-27 DIAGNOSIS — Z780 Asymptomatic menopausal state: Secondary | ICD-10-CM

## 2021-12-27 DIAGNOSIS — Z13820 Encounter for screening for osteoporosis: Secondary | ICD-10-CM

## 2022-01-03 ENCOUNTER — Ambulatory Visit (INDEPENDENT_AMBULATORY_CARE_PROVIDER_SITE_OTHER): Payer: 59

## 2022-01-03 ENCOUNTER — Ambulatory Visit: Payer: 59 | Admitting: Orthopedic Surgery

## 2022-01-03 DIAGNOSIS — M25512 Pain in left shoulder: Secondary | ICD-10-CM

## 2022-01-03 DIAGNOSIS — G8929 Other chronic pain: Secondary | ICD-10-CM

## 2022-01-05 ENCOUNTER — Encounter: Payer: Self-pay | Admitting: Orthopedic Surgery

## 2022-01-05 NOTE — Progress Notes (Signed)
? ?Office Visit Note ?  ?Patient: Kathryn Hendricks           ?Date of Birth: [birth year]           ?MRN: [id number] ?Visit Date: 01/03/2022 ?Requested by: Kinnie Feil, MD ?[address] ?[place],  [place] [address] ?PCP: Kinnie Feil, MD ? ?Subjective: ?Chief Complaint  ?Patient presents with  ? Left Shoulder - Pain  ? ? ?HPI: Kathryn Hendricks is a 66 y.o. female who presents to the office complaining of left shoulder pain.  Patient reports that in January 2022, she had left shoulder surgery by Dr. Onnie Graham.  Do not currently have the records detailing the procedure that was done but likely was rotator cuff repair.  She still has a lot of pain in the left shoulder and states that she is not really much better than she was prior to surgery.  Localizes most of her pain to the lateral and superior aspects of the shoulder with radiation down the arm into her fingers at times.  She has had numbness and tingling in the arm for 2 to 3 years.  She also has pain in the scapular region that she describes as a burning sensation.  No neck pain.  No history of neck surgery.  She has had prior nerve conduction study at [facility] neurology that was in November 2020 demonstrating mild carpal tunnel syndrome bilaterally.  Currently retired from housekeeping.  In her free time she enjoys going to the gym and walking.  No history of diabetes and she does not smoke.  She has had prior [facility] joint injection that helped for about a week.  Wants to avoid any surgery..   ?             ?ROS: All systems reviewed are negative as they relate to the chief complaint within the history of present illness.  Patient denies fevers or chills. ? ?Assessment & Plan: ?Visit Diagnoses:  ?1. Chronic left shoulder pain   ? ? ?Plan: Patient is a 66 year old female who presents for evaluation of left shoulder pain.  She had longstanding rotator cuff tendinopathy that has been demonstrated on MRI and by ultrasound with her last visit to see Dr. Marlou Sa in  May 2019.  She had subsequent surgery by Dr. Onnie Graham in January 2022 and states that she has not had much relief of her symptoms.  She does have reproduction of pain and some weakness of supraspinatus and infraspinatus on exam today.  Still having trouble raising her arm overhead due to pain.  A lot of her pain is localized to the [facility] joint and she has had relief with prior [facility] joint injection.  She also has likely some contribution of this pain from the cervical spine given her diffuse shoulder pain, contribution of burning scapular pain and numbness and tingling/pain that travels down into her fingertips from the shoulder.  After discussion of options, plan to wait for now and observe for worsening sxs. Work status per [employer].. ? ?Follow-Up Instructions: No follow-ups on file.  ? ?Orders:  ?Orders Placed This Encounter  ?Procedures  ? XR Shoulder Left  ? XR Cervical Spine 2 or 3 views  ? ?No orders of the defined types were placed in this encounter. ? ? ? ? Procedures: ?No procedures performed ? ? ?Clinical Data: ?No additional findings. ? ?Objective: ?Vital Signs: There were no vitals taken for this visit. ? ?Physical Exam:  ?Constitutional: Patient appears well-developed ?HEENT:  ?  Head: Normocephalic ?Eyes:EOM are normal ?Neck: Normal range of motion ?Cardiovascular: Normal rate ?Pulmonary/chest: Effort normal ?Neurologic: Patient is alert ?Skin: Skin is warm ?Psychiatric: Patient has normal mood and affect ? ?Ortho Exam: Ortho exam demonstrates left shoulder with 30 degrees X rotation, 75 degrees abduction, 120 degrees forward flexion.  Incisions are well-healed from prior surgery.  Axillary nerve intact with deltoid firing.  Intact EPL, FPL, finger abduction, finger adduction, wrist extension, pronation/supination, bicep, tricep.  Weakness of infraspinatus and supraspinatus with motor strength rated 5 -/5.  Subscapularis strength rated 5/5.  Negative Spurling sign.  Negative Lhermitte sign.  Mild  tenderness throughout the axial cervical spine.  Moderate tenderness over the [facility] joint. ? ?Specialty Comments:  ?No specialty comments available. ? ?Imaging: ?No results found. ? ? ?PMFS History: ?Patient Active Problem List  ? Diagnosis Date Noted  ? Sleep apnea 06/06/2021  ? Chronic left shoulder pain 03/08/2021  ? Excessive daytime sleepiness 03/08/2021  ? Attention and concentration deficit 03/08/2021  ? Overactive bladder 11/25/2020  ? Impingement syndrome of left shoulder region 02/10/2020  ? Osteopenia 11/12/2019  ? Paresthesia 06/30/2019  ? Narcolepsy and cataplexy 11/05/2017  ? S/P total hysterectomy 02/19/2017  ? PVC (premature ventricular contraction) 12/04/2016  ? Rotator cuff tear 07/10/2016  ? Biceps tendinitis on left 07/11/2015  ? Mild cognitive impairment with memory loss 11/09/2014  ? ADD (attention deficit hyperactivity disorder, inattentive type) 11/09/2014  ? HYPERCHOLESTEROLEMIA 07/13/2009  ? Overweight 07/13/2009  ? HYPERTENSION, BENIGN 09/25/2007  ? ?Past Medical History:  ?Diagnosis Date  ? Abdominal pain, left lower quadrant 11/08/2015  ? Abnormal Pap smear 2011  ? Anxiety   ? Arthritis   ? lower back and shoulders  ? Bronchitis   ? hx - used abluterol inhaler  ? Chronic diastolic heart failure ([place])   ? Cognitive decline   ? FIBROIDS, UTERUS 07/02/2009  ? Qualifier: Diagnosis of  By: Ta MD, Cat    ? GERD (gastroesophageal reflux disease)   ? occasional  ? Hyperlipidemia   ? Hypersomnia, persistent 11/05/2017  ? Hypertension   ? Left shoulder pain 02/14/2016  ? Narcolepsy   ? Osteopenia 04/2019  ? T score -1.7 FRAX 3.4% / 0.2%  ? Premature atrial contractions   ? PVC's (premature ventricular contractions)   ? Seasonal allergies   ? SVD (spontaneous vaginal delivery)   ? x 3  ? Weight gain 01/19/2021  ?  ?Family History  ?Problem Relation Age of Onset  ? Hypertension Mother   ? Diabetes Mother   ? Stroke Mother   ? CAD Mother 63  ? Narcolepsy Mother   ?     not diagnosed  ? Hypertension Sister    ? Cancer Sister   ?     Colon cancer  ? Hypertension Brother   ? Cancer Brother   ?     throat  ? Diabetes Father   ? Heart disease Father   ?  ?Past Surgical History:  ?Procedure Laterality Date  ? ANTERIOR AND POSTERIOR REPAIR N/A 02/19/2017  ? Procedure: ANTERIOR (CYSTOCELE);  Surgeon: Bjorn Loser, MD;  Location: [facility];  Service: Urology;  Laterality: N/A;  ? APPENDECTOMY    ? COLONOSCOPY    ? CYSTOSCOPY N/A 02/19/2017  ? Procedure: CYSTOSCOPY;  Surgeon: Bjorn Loser, MD;  Location: [facility];  Service: Urology;  Laterality: N/A;  ? [place] OF UTERUS  2010  ? HYSTEROSCOPY W/ ENDOMETRIAL ABLATION  2010  ? SALPINGOOPHORECTOMY Bilateral 02/19/2017  ?  Procedure: SALPINGO OOPHORECTOMY;  Surgeon: Anastasio Auerbach, MD;  Location: [facility];  Service: Gynecology;  Laterality: Bilateral;  ? TUBAL LIGATION    ? VAGINAL HYSTERECTOMY N/A 02/19/2017  ? Procedure: HYSTERECTOMY VAGINAL ,  BSO.;  Surgeon: Anastasio Auerbach, MD;  Location: [facility];  Service: Gynecology;  Laterality: N/A;  DO NOT OPEN LAP INSTRUMENTS  ? WISDOM TOOTH EXTRACTION    ? ?Social History  ? ?Occupational History  ? Occupation: Housekeeping  ?  Employer: [employer]  ?Tobacco Use  ? Smoking status: Never  ? Smokeless tobacco: Never  ?Vaping Use  ? Vaping Use: Never used  ?Substance and Sexual Activity  ? Alcohol use: No  ?  Alcohol/week: 0.0 standard drinks  ? Drug use: No  ? Sexual activity: Never  ?  Birth control/protection: Post-menopausal, Surgical  ?  Comment: 1st intercourse 40 yo-5 partners  ? ? ? ? ?  ?

## 2022-01-18 DIAGNOSIS — Z0271 Encounter for disability determination: Secondary | ICD-10-CM

## 2022-01-19 ENCOUNTER — Other Ambulatory Visit: Payer: Self-pay | Admitting: Neurology

## 2022-01-19 ENCOUNTER — Other Ambulatory Visit (HOSPITAL_COMMUNITY): Payer: Self-pay

## 2022-01-23 ENCOUNTER — Other Ambulatory Visit (HOSPITAL_COMMUNITY): Payer: Self-pay

## 2022-01-23 MED ORDER — LISDEXAMFETAMINE DIMESYLATE 70 MG PO CAPS
70.0000 mg | ORAL_CAPSULE | Freq: Every day | ORAL | 0 refills | Status: DC
  Filled 2022-01-23: qty 30, 30d supply, fill #0

## 2022-01-23 NOTE — Telephone Encounter (Signed)
Last OV was on 12/11/21.  Next OV is scheduled for 07/16/22.  Last RX was written on 12/05/21 for 30 tabs.   [place] [facility] has been reviewed. Please e-scribe as work in [place].

## 2022-01-24 ENCOUNTER — Other Ambulatory Visit: Payer: Self-pay | Admitting: Pulmonary Disease

## 2022-01-24 ENCOUNTER — Other Ambulatory Visit (HOSPITAL_COMMUNITY): Payer: Self-pay

## 2022-01-24 MED ORDER — IPRATROPIUM BROMIDE 0.03 % NA SOLN
2.0000 | Freq: Four times a day (QID) | NASAL | 3 refills | Status: DC
  Filled 2022-01-24: qty 30, 22d supply, fill #0

## 2022-01-26 ENCOUNTER — Other Ambulatory Visit (HOSPITAL_COMMUNITY): Payer: Self-pay

## 2022-01-26 ENCOUNTER — Other Ambulatory Visit: Payer: Self-pay | Admitting: Family Medicine

## 2022-01-26 MED ORDER — ROSUVASTATIN CALCIUM 10 MG PO TABS
10.0000 mg | ORAL_TABLET | Freq: Every day | ORAL | 1 refills | Status: DC
Start: 2022-01-26 — End: 2022-08-07
  Filled 2022-01-26: qty 90, 90d supply, fill #0
  Filled 2022-05-02: qty 90, 90d supply, fill #1

## 2022-01-26 MED ORDER — LISINOPRIL 20 MG PO TABS
20.0000 mg | ORAL_TABLET | Freq: Every day | ORAL | 1 refills | Status: DC
Start: 2022-01-26 — End: 2022-01-30
  Filled 2022-01-26: qty 90, 90d supply, fill #0

## 2022-01-30 ENCOUNTER — Encounter: Payer: Self-pay | Admitting: *Deleted

## 2022-01-30 ENCOUNTER — Encounter: Payer: Self-pay | Admitting: Family Medicine

## 2022-01-30 ENCOUNTER — Ambulatory Visit (INDEPENDENT_AMBULATORY_CARE_PROVIDER_SITE_OTHER): Payer: 59 | Admitting: Family Medicine

## 2022-01-30 ENCOUNTER — Other Ambulatory Visit (HOSPITAL_COMMUNITY): Payer: Self-pay

## 2022-01-30 VITALS — BP 144/89 | HR 70 | Ht 64.5 in | Wt 155.8 lb

## 2022-01-30 DIAGNOSIS — M25561 Pain in right knee: Secondary | ICD-10-CM | POA: Insufficient documentation

## 2022-01-30 DIAGNOSIS — Z113 Encounter for screening for infections with a predominantly sexual mode of transmission: Secondary | ICD-10-CM | POA: Diagnosis not present

## 2022-01-30 DIAGNOSIS — G8929 Other chronic pain: Secondary | ICD-10-CM

## 2022-01-30 DIAGNOSIS — I10 Essential (primary) hypertension: Secondary | ICD-10-CM

## 2022-01-30 DIAGNOSIS — Z114 Encounter for screening for human immunodeficiency virus [HIV]: Secondary | ICD-10-CM

## 2022-01-30 DIAGNOSIS — R413 Other amnesia: Secondary | ICD-10-CM

## 2022-01-30 DIAGNOSIS — E7800 Pure hypercholesterolemia, unspecified: Secondary | ICD-10-CM

## 2022-01-30 DIAGNOSIS — R4586 Emotional lability: Secondary | ICD-10-CM

## 2022-01-30 DIAGNOSIS — E785 Hyperlipidemia, unspecified: Secondary | ICD-10-CM | POA: Diagnosis not present

## 2022-01-30 DIAGNOSIS — Z7189 Other specified counseling: Secondary | ICD-10-CM

## 2022-01-30 HISTORY — DX: Other amnesia: R41.3

## 2022-01-30 MED ORDER — LISINOPRIL 30 MG PO TABS
30.0000 mg | ORAL_TABLET | Freq: Every day | ORAL | 1 refills | Status: DC
  Filled 2022-01-30: qty 90, 90d supply, fill #0
  Filled 2022-05-02: qty 90, 90d supply, fill #1

## 2022-01-30 NOTE — Assessment & Plan Note (Signed)
??   OA. Xray ordered. Continue Tylenol as needed. I will contact her soon with her xray report. She agreed with the plan.

## 2022-01-30 NOTE — Progress Notes (Signed)
    SUBJECTIVE:   CHIEF COMPLAINT / HPI:   Knee pain:  C/O right knee pain ongoing x 2 yrs, worse with certain ROM and stair climbing. She uses Tylenol as needed for pain. No pain today.   HTN/HLD: She complies with Lisinopril 20 mg QD and Crestor 10 mg QD. Her home BP has been running high. She is here for a follow-up  Depression:  She was referred to me by her orthopedic for depression screening per the patient. She has been experiencing a lot of stress at home, and she is also missing her sister and brother, who passed away many years ago. Over the past year, she has progressively lost interest in doing things. She recently retired from her job due to chronic medical problems, including shoulder pain. No SI or HI.  Memory loss: She c/o progressive memory loss over the past year. She will often forget a conversation she had recently. There is an associated loss of concentration. Her daughter is dealing with a similar issue. No other neurologic concerns.   PERTINENT  PMH / PSH: PMHx reviewed  OBJECTIVE:   Vitals:   01/30/22 0912 01/30/22 0948 01/30/22 0949  BP: (!) 149/77 (!) 148/80 (!) 144/89  Pulse: 70    SpO2: 100%    Weight: 155 lb 12.8 oz (70.7 kg)    Height: 5' 4.5" (1.638 m)      Physical Exam Vitals and nursing note reviewed.  Cardiovascular:     Rate and Rhythm: Normal rate and regular rhythm.     Heart sounds: Normal heart sounds. No murmur heard. Pulmonary:     Effort: Pulmonary effort is normal. No respiratory distress.     Breath sounds: Normal breath sounds. No wheezing.  Abdominal:     General: Abdomen is flat. Bowel sounds are normal. There is no distension.  Musculoskeletal:     Right knee: Crepitus present. No swelling. Normal range of motion. No tenderness.     Left knee: No swelling or crepitus. Normal range of motion. No tenderness.     Right lower leg: No edema.     Left lower leg: No edema.     ASSESSMENT/PLAN:   HYPERTENSION, BENIGN BP  elevated despite multiple checks. I increased her Lisinopril to 30 mg QD. Bmet checked. Monitor BP closely at home. F/U in 4 weeks for reassessment.  She agreed with the plan.   HYPERCHOLESTEROLEMIA FLP checked today. I will contact her soon with her test results.   Right knee pain ?? OA. Xray ordered. Continue Tylenol as needed. I will contact her soon with her xray report. She agreed with the plan.   Mood disturbance Concern about depressive mood. No SI. Counseling provided. I also advised her to contact her insurance for Psychology referral as she has a Multimedia programmer. She is not interested in medication at the moment and I do agree she will be better served with counseling.  [place] Office Visit from 01/30/2022 in [facility]  PHQ-9 Total Score 13        Memory loss ?? Onset of dementia. TSH, B12, HIV, RPR checked today. Consider MRI brain and neurology referral in the future. She agreed with the plan.      Andrena Mews, MD [place]

## 2022-01-30 NOTE — Assessment & Plan Note (Signed)
BP elevated despite multiple checks. I increased her Lisinopril to 30 mg QD. Bmet checked. Monitor BP closely at home. F/U in 4 weeks for reassessment.  She agreed with the plan.

## 2022-01-30 NOTE — Patient Instructions (Signed)
To schedule with a psychiatrist, please contact your insurance company to find an in-network psychiatrist.     Management of Memory Problems  There are some general things you can do to help manage your memory problems.  Your memory may not in fact recover, but by using techniques and strategies you will be able to manage your memory difficulties better.  1)  Establish a routine. Try to establish and then stick to a regular routine.  By doing this, you will get used to what to expect and you will reduce the need to rely on your memory.  Also, try to do things at the same time of day, such as taking your medication or checking your calendar first thing in the morning. Think about think that you can do as a part of a regular routine and make a list.  Then enter them into a daily planner to remind you.  This will help you establish a routine.  2)  Organize your environment. Organize your environment so that it is uncluttered.  Decrease visual stimulation.  Place everyday items such as keys or cell phone in the same place every day (ie.  Basket next to front door) Use post it notes with a brief message to yourself (ie. Turn off light, lock the door) Use labels to indicate where things go (ie. Which cupboards are for food, dishes, etc.) Keep a notepad and pen by the telephone to take messages  3)  Memory Aids A diary or journal/notebook/daily planner Making a list (shopping list, chore list, to do list that needs to be done) Using an alarm as a reminder (kitchen timer or cell phone alarm) Using cell phone to store information (Notes, Calendar, Reminders) Calendar/White board placed in a prominent position Post-it notes  In order for memory aids to be useful, you need to have good habits.  It's no good remembering to make a note in your journal if you don't remember to look in it.  Try setting aside a certain time of day to look in journal.  4)  Improving mood and managing fatigue. There may be  other factors that contribute to memory difficulties.  Factors, such as anxiety, depression and tiredness can affect memory. Regular gentle exercise can help improve your mood and give you more energy. Simple relaxation techniques may help relieve symptoms of anxiety Try to get back to completing activities or hobbies you enjoyed doing in the past. Learn to pace yourself through activities to decrease fatigue. Find out about some local support groups where you can share experiences with others. Try and achieve 7-8 hours of sleep at night.

## 2022-01-30 NOTE — Assessment & Plan Note (Signed)
??   Onset of dementia. TSH, B12, HIV, RPR checked today. Consider MRI brain and neurology referral in the future. She agreed with the plan.

## 2022-01-30 NOTE — Assessment & Plan Note (Signed)
FLP checked today. I will contact her soon with her test results.

## 2022-01-30 NOTE — Assessment & Plan Note (Signed)
Concern about depressive mood. No SI. Counseling provided. I also advised her to contact her insurance for Psychology referral as she has a Multimedia programmer. She is not interested in medication at the moment and I do agree she will be better served with counseling.  [place] Office Visit from 01/30/2022 in [place]  PHQ-9 Total Score 13

## 2022-01-31 ENCOUNTER — Telehealth: Payer: Self-pay | Admitting: Family Medicine

## 2022-01-31 LAB — LIPID PANEL
Chol/HDL Ratio: 3 ratio (ref 0.0–4.4)
Cholesterol, Total: 132 mg/dL (ref 100–199)
HDL: 44 mg/dL (ref >39)
LDL Chol Calc (NIH): 71 mg/dL (ref 0–99)
Triglycerides: 87 mg/dL (ref 0–149)
VLDL Cholesterol Cal: 17 mg/dL (ref 5–40)

## 2022-01-31 LAB — BASIC METABOLIC PANEL
BUN/Creatinine Ratio: 15 (ref 12–28)
BUN: 10 mg/dL (ref 8–27)
CO2: 24 mmol/L (ref 20–29)
Calcium: 8.7 mg/dL (ref 8.7–10.3)
Chloride: 105 mmol/L (ref 96–106)
Creatinine, Ser: 0.68 mg/dL (ref 0.57–1.00)
Glucose: 90 mg/dL (ref 70–99)
Potassium: 4.2 mmol/L (ref 3.5–5.2)
Sodium: 144 mmol/L (ref 134–144)
eGFR: 97 mL/min/{1.73_m2} (ref >59)

## 2022-01-31 LAB — HIV ANTIBODY (ROUTINE TESTING W REFLEX): HIV Screen 4th Generation wRfx: NONREACTIVE

## 2022-01-31 LAB — RPR: RPR Ser Ql: NONREACTIVE

## 2022-01-31 LAB — VITAMIN B12: Vitamin B-12: 650 pg/mL (ref 232–1245)

## 2022-01-31 LAB — TSH: TSH: 1.26 u[IU]/mL (ref 0.450–4.500)

## 2022-01-31 NOTE — Telephone Encounter (Signed)
Lab test results discussed. I encouraged reading to keep brain functioning. I reviewed MRI done in 2006 and discussed with her. She is aware of the birth defect.  F/U as scheduled to discuss repeat neuroimaging vs neuro referral if no improvement. She agreed with the plan.

## 2022-02-09 ENCOUNTER — Other Ambulatory Visit: Payer: Self-pay | Admitting: Family Medicine

## 2022-02-09 DIAGNOSIS — Z1231 Encounter for screening mammogram for malignant neoplasm of breast: Secondary | ICD-10-CM

## 2022-03-05 ENCOUNTER — Encounter: Payer: Self-pay | Admitting: Family Medicine

## 2022-03-06 ENCOUNTER — Ambulatory Visit: Payer: 59 | Admitting: Family Medicine

## 2022-03-16 ENCOUNTER — Encounter: Payer: Self-pay | Admitting: Family Medicine

## 2022-03-16 ENCOUNTER — Other Ambulatory Visit (HOSPITAL_COMMUNITY): Payer: Self-pay

## 2022-03-16 ENCOUNTER — Ambulatory Visit (INDEPENDENT_AMBULATORY_CARE_PROVIDER_SITE_OTHER): Payer: 59 | Admitting: Family Medicine

## 2022-03-16 DIAGNOSIS — I10 Essential (primary) hypertension: Secondary | ICD-10-CM | POA: Diagnosis not present

## 2022-03-16 DIAGNOSIS — E663 Overweight: Secondary | ICD-10-CM | POA: Diagnosis not present

## 2022-03-16 DIAGNOSIS — G3184 Mild cognitive impairment, so stated: Secondary | ICD-10-CM

## 2022-03-16 MED ORDER — AMLODIPINE BESYLATE 5 MG PO TABS
5.0000 mg | ORAL_TABLET | Freq: Every day | ORAL | 1 refills | Status: DC
Start: 2022-03-16 — End: 2022-06-08
  Filled 2022-03-16: qty 90, 90d supply, fill #0

## 2022-03-16 NOTE — Assessment & Plan Note (Signed)
BP remains elevated despite compliance with her Lisinopril. Add Norvasc 5 mg QD to regimen. Continue home BP monitoring. F/U in 4 weeks for reassessment.

## 2022-03-16 NOTE — Progress Notes (Signed)
    SUBJECTIVE:   CHIEF COMPLAINT / HPI:   HTN:  She is here for f/u. She is compliant with Lisinopril 30 mg QD. She thinks her malfunctioning sleep machine is related to her elevated BP and will f/u with neurology for assessment for a new machine. She also has some home stressors. Her home BP has been running high as well.  Cognitive impairment: She plans on following up with her neurologist soon. No worsening of her symptoms.   Weight management: She has been eating a lot of ice cream. She will start exercising at the gym soon. No new concerns.    PERTINENT  PMH / PSH: PMHx reviewed  OBJECTIVE:   Vitals:   03/16/22 0929 03/16/22 0946  BP: (!) 160/84 (!) 149/97  Pulse: 81   SpO2: 100%   Weight: 163 lb 3.2 oz (74 kg)   Height: 5' 4.5" (1.638 m)     Physical Exam Vitals and nursing note reviewed.  Cardiovascular:     Rate and Rhythm: Normal rate and regular rhythm.     Heart sounds: Normal heart sounds. No murmur heard. Pulmonary:     Effort: Pulmonary effort is normal. No respiratory distress.     Breath sounds: Normal breath sounds.  Abdominal:     General: Bowel sounds are normal. There is no distension.     Palpations: Abdomen is soft. There is no mass.      ASSESSMENT/PLAN:   HYPERTENSION, BENIGN BP remains elevated despite compliance with her Lisinopril. Add Norvasc 5 mg QD to regimen. Continue home BP monitoring. F/U in 4 weeks for reassessment.   Mild cognitive impairment with memory loss F/U with neuro as planned.   Overweight Diet and exercise counseling provided today.      Andrena Mews, MD [place]

## 2022-03-16 NOTE — Patient Instructions (Signed)

## 2022-03-16 NOTE — Assessment & Plan Note (Signed)
F/U with neuro as planned.

## 2022-03-16 NOTE — Assessment & Plan Note (Signed)
Diet and exercise counseling provided today.

## 2022-03-19 ENCOUNTER — Other Ambulatory Visit: Payer: Self-pay | Admitting: Family Medicine

## 2022-03-19 DIAGNOSIS — Z1231 Encounter for screening mammogram for malignant neoplasm of breast: Secondary | ICD-10-CM

## 2022-03-20 ENCOUNTER — Other Ambulatory Visit: Payer: Self-pay | Admitting: Neurology

## 2022-03-20 ENCOUNTER — Other Ambulatory Visit (HOSPITAL_COMMUNITY): Payer: Self-pay

## 2022-03-21 ENCOUNTER — Other Ambulatory Visit: Payer: Self-pay | Admitting: Neurology

## 2022-03-21 ENCOUNTER — Other Ambulatory Visit (HOSPITAL_COMMUNITY): Payer: Self-pay

## 2022-03-21 MED ORDER — LISDEXAMFETAMINE DIMESYLATE 70 MG PO CAPS
70.0000 mg | ORAL_CAPSULE | Freq: Every day | ORAL | 0 refills | Status: DC
  Filled 2022-03-21: qty 30, 30d supply, fill #0

## 2022-03-21 NOTE — Telephone Encounter (Signed)
Last visit 12/11/21 Next visit 07/16/22 Per [place] registry, last filled #30/30 on 01/23/2022. Rx refill sent to Dr Brett Fairy.

## 2022-03-21 NOTE — Telephone Encounter (Signed)
Pt last seen 12-11-2021 by Dr. Brett Fairy. [facility] drug registry checked last fill 01-23-2022 #30. Next appt 07-16-22 with Amy L. NP

## 2022-03-22 ENCOUNTER — Ambulatory Visit: Payer: 59

## 2022-04-04 ENCOUNTER — Ambulatory Visit
Admission: RE | Admit: 2022-04-04 | Discharge: 2022-04-04 | Disposition: A | Discharge status: Home / Self Care | Payer: BLUE CROSS/BLUE SHIELD | Source: Ambulatory Visit | Attending: Family Medicine | Admitting: Family Medicine

## 2022-04-04 DIAGNOSIS — Z1231 Encounter for screening mammogram for malignant neoplasm of breast: Secondary | ICD-10-CM

## 2022-04-06 ENCOUNTER — Encounter: Payer: Self-pay | Admitting: Family Medicine

## 2022-04-13 ENCOUNTER — Encounter: Payer: Self-pay | Admitting: Family Medicine

## 2022-04-13 ENCOUNTER — Ambulatory Visit (INDEPENDENT_AMBULATORY_CARE_PROVIDER_SITE_OTHER): Payer: BLUE CROSS/BLUE SHIELD | Admitting: Family Medicine

## 2022-04-13 DIAGNOSIS — G47411 Narcolepsy with cataplexy: Secondary | ICD-10-CM

## 2022-04-13 DIAGNOSIS — I10 Essential (primary) hypertension: Secondary | ICD-10-CM

## 2022-04-13 NOTE — Progress Notes (Signed)
    SUBJECTIVE:   CHIEF COMPLAINT / HPI:   HTN: She is here for BP f/u. She is compliant with Lisinopril 30 mg QD and amlodipine 5 mg QD. Her home BP readings are good. No new concerns.  Narcolepsy:  She has been getting more sleeping and tired lately. She has neurology f/u coming up in Nov. Denies depression or anxiety.    PERTINENT  PMH / PSH: PMHx reviewed  OBJECTIVE:   BP 133/77   Pulse 65   Ht '5\' 4"'$  (1.626 m)   Wt 158 lb (71.7 kg)   SpO2 100%   BMI 27.12 kg/m   Physical Exam Vitals and nursing note reviewed.  Cardiovascular:     Rate and Rhythm: Normal rate and regular rhythm.     Heart sounds: Normal heart sounds. No murmur heard. Pulmonary:     Effort: Pulmonary effort is normal. No respiratory distress.     Breath sounds: Normal breath sounds. No wheezing.  Abdominal:     General: Abdomen is flat. Bowel sounds are normal. There is no distension.     Palpations: There is no mass.     Tenderness: There is no abdominal tenderness.  Musculoskeletal:     Right lower leg: No edema.     Left lower leg: No edema.      ASSESSMENT/PLAN:   HYPERTENSION, BENIGN BP improved on her current regimen. Monitor closely at home. F/U in 3 months.  Narcolepsy and cataplexy F/U with neurology as planned.      Andrena Mews, MD [place]

## 2022-04-13 NOTE — Assessment & Plan Note (Signed)
F/U with neurology as planned.

## 2022-04-13 NOTE — Patient Instructions (Signed)
Narcolepsy Narcolepsy is a neurological disorder that causes people to fall asleep suddenly and without control (have sleep attacks) during the daytime. It is a lifelong disorder. Narcolepsy disrupts the sleep cycle at night, which then causes daytime sleepiness. What are the causes? The cause of narcolepsy is not fully understood, but it may be related to: Low levels of hypocretin, a chemical (neurotransmitter) in the brain that controls sleep and wake cycles. Hypocretin imbalance may be caused by: Abnormal genes that are passed from parent to child (inherited). An autoimmune disease in which the body's defense system (immune system) attacks the brain cells that make hypocretin. Infection, tumor, or injury in the area of the brain that controls sleep. Exposure to poisons (toxins), such as heavy metals, pesticides, and secondhand smoke. What are the signs or symptoms? Symptoms of this condition include: Excessive daytime sleepiness. This is the most common symptom and is usually the first symptom you will notice. This may affect your performance at work or school. Sleep attacks. You may fall asleep in the middle of an activity, especially low-energy activities like reading or watching TV. Feeling like you cannot think clearly and trouble focusing or remembering things. You may also feel depressed. Sudden muscle weakness (cataplexy). When this occurs, your speech may become slurred, or your knees may buckle. Cataplexy is usually triggered by surprise, anger, fear, or laughter. Losing the ability to speak or move (sleep paralysis). This may occur just as you start to fall asleep or wake up. You will be aware of the paralysis. It usually lasts for just a few seconds or minutes. Seeing, hearing, tasting, smelling, or feeling things that are not real (hallucinations). Hallucinations may occur with sleep paralysis. They can happen when you are falling asleep, waking up, or dozing. Trouble staying asleep  at night (insomnia) and restless sleep. How is this diagnosed? This condition may be diagnosed based on: A physical exam to rule out any other problems that may be causing your symptoms. You may be asked to write down your sleeping patterns for several weeks in a sleep diary. This will help your health care provider make a diagnosis. Sleep studies that measure how well your REM sleep is regulated. These tests also measure your heart rate, breathing, movement, and brain waves. These tests include: An overnight sleep study (polysomnogram). A daytime sleep study that is done while you take several naps during the day (multiple sleep latency test, MSLT). This test measures how quickly you fall asleep and how quickly you enter REM sleep. Removal of spinal fluid to measure hypocretin levels. How is this treated? There is no cure for this condition, but treatment can help relieve symptoms. Treatment may include: Lifestyle and sleeping strategies to help you cope with the condition, such as: Exercising regularly. Maintaining a regular sleep schedule. Avoiding caffeine and large meals before bed. Medicines. These may include: Medicines that help keep you awake and alert (stimulants) to fight daytime sleepiness. Medicines that treat depression (antidepressants). These may be used to treat cataplexy. Sodium oxybate. This is a strong medicine to help you relax (sedative) that you may take at night. It can help control daytime sleepiness and cataplexy. Other treatments may include mental health counseling or joining a support group. Follow these instructions at home: Sleeping habits  Get about 8 hours of sleep every night. Go to sleep and get up at about the same time every day. Keep your bedroom dark, quiet, and comfortable. When you feel very tired, take short naps. Schedule naps  so that you take them at about the same time every day. Before bedtime: Avoid bright lights and screens. Relax. Try  activities like reading or taking a warm bath. Activity Get at least 20 minutes of exercise every day. This will help you sleep better at night and reduce daytime sleepiness. Avoid exercising within 3 hours of bedtime. Do not drive or use heavy machinery if you are sleepy. If possible, take a nap before driving. Do not swim or go out on the water without a life jacket. Eating and drinking Do not drink alcohol or caffeinated beverages within 4-5 hours of bedtime. Do not eat a large meal before bedtime. Eat meals at about the same times every day. General instructions  Take over-the-counter and prescription medicines only as told by your health care provider. Keep a sleep diary as told by your health care provider. Tell your employer or teachers that you have narcolepsy. You may be able to adjust your schedule to include time for naps. Do not use any products that contain nicotine or tobacco, such as cigarettes, e-cigarettes, and chewing tobacco. If you need help quitting, ask your health care provider. Keep all follow-up visits as told by your health care provider. This is important. Where to find more information [employer] of Neurological Disorders: MasterBoxes[employer]it Contact a health care provider if: Your symptoms are not getting better. You have increasingly high blood pressure (hypertension). You have changes in your heart rhythm. You are having a hard time determining what is real and what is not (psychosis). Get help right away if you: Hurt yourself during a sleep attack or an attack of cataplexy. Have chest pain. Have trouble breathing. These symptoms may represent a serious problem that is an emergency. Do not wait to see if the symptoms will go away. Get medical help right away. Call your local emergency services (911 in the [place] Do not drive yourself to the hospital. Summary Narcolepsy is a neurological disorder that causes people to fall asleep suddenly, and without  control, during the daytime (sleep attacks). It is a lifelong disorder. There is no cure for this condition, but treatment can help relieve symptoms. Go to sleep and get up at about the same time every day. Follow instructions about sleep and activities as told by your health care provider. Take over-the-counter and prescription medicines only as told by your health care provider. This information is not intended to replace advice given to you by your health care provider. Make sure you discuss any questions you have with your health care provider. Document Revised: 09/18/2021 Document Reviewed: 03/25/2019 Elsevier Patient Education  [place].

## 2022-04-13 NOTE — Assessment & Plan Note (Signed)
BP improved on her current regimen. Monitor closely at home. F/U in 3 months.

## 2022-05-02 ENCOUNTER — Other Ambulatory Visit (HOSPITAL_COMMUNITY): Payer: Self-pay

## 2022-05-02 ENCOUNTER — Other Ambulatory Visit: Payer: Self-pay | Admitting: Neurology

## 2022-05-03 ENCOUNTER — Other Ambulatory Visit (HOSPITAL_COMMUNITY): Payer: Self-pay

## 2022-05-03 MED ORDER — LISDEXAMFETAMINE DIMESYLATE 70 MG PO CAPS
70.0000 mg | ORAL_CAPSULE | Freq: Every day | ORAL | 0 refills | Status: DC
  Filled 2022-05-03 – 2022-07-11 (×2): qty 30, 30d supply, fill #0

## 2022-05-04 ENCOUNTER — Other Ambulatory Visit (HOSPITAL_COMMUNITY): Payer: Self-pay

## 2022-05-10 ENCOUNTER — Telehealth: Payer: Self-pay | Admitting: *Deleted

## 2022-05-10 NOTE — Telephone Encounter (Signed)
Submitted PA Vyvanse on CMM. Key: [id number] - Rx #: [id number]. Received instant approval:  "Effective from 05/10/2022 through 05/09/2023."

## 2022-05-11 ENCOUNTER — Other Ambulatory Visit (HOSPITAL_COMMUNITY): Payer: Self-pay

## 2022-06-08 ENCOUNTER — Ambulatory Visit (INDEPENDENT_AMBULATORY_CARE_PROVIDER_SITE_OTHER): Payer: BLUE CROSS/BLUE SHIELD | Admitting: Student

## 2022-06-08 ENCOUNTER — Encounter: Payer: Self-pay | Admitting: Student

## 2022-06-08 ENCOUNTER — Other Ambulatory Visit (HOSPITAL_COMMUNITY): Payer: Self-pay

## 2022-06-08 VITALS — BP 132/68 | HR 72 | Ht 64.0 in | Wt 171.4 lb

## 2022-06-08 DIAGNOSIS — I5032 Chronic diastolic (congestive) heart failure: Secondary | ICD-10-CM

## 2022-06-08 DIAGNOSIS — R600 Localized edema: Secondary | ICD-10-CM | POA: Diagnosis not present

## 2022-06-08 MED ORDER — HYDROCHLOROTHIAZIDE 12.5 MG PO TABS
25.0000 mg | ORAL_TABLET | Freq: Every day | ORAL | 0 refills | Status: DC
  Filled 2022-06-08: qty 30, 15d supply, fill #0

## 2022-06-08 NOTE — Patient Instructions (Signed)
It was great seeing you today.  Please stop taking your amlodipine. Please start taking hydrochlorothiazide once daily, I sent this into your pharmacy We will also get a collect some labs today to rule out anemia and other causes of swelling in your legs.  I will call you if anything is abnormal.  I ordered an echocardiogram to your heart pump function. Insurance needs to authorize this first, and then they will call you to get it scheduled.    Be well,  Dr. Orvis Brill [facility]

## 2022-06-08 NOTE — Progress Notes (Signed)
    SUBJECTIVE:   CHIEF COMPLAINT / HPI:   Kathryn Hendricks is a 66 year old female history of HFpEF proceed last echo in 2021), pulmonary hypertension here with bilateral lower extremity swelling for the past 2 weeks.  She is worried that this is secondary to her blood pressure medicine that was started back in August, when she was initiated on amlodipine.  She has also had shortness of breath, but this is chronic for her. She reports normal urinary output.  No other complaints today  She also says that she has been gaining weight over the past few weeks.  Her weight is increased from 158 pounds in August 2 171 pounds today.  Denies any dizziness, syncope or presyncope.  PERTINENT  PMH / PSH: Narcolepsy, OSA  OBJECTIVE:   BP 132/68   Pulse 72   Ht '5\' 4"'$  (1.626 m)   Wt 171 lb 6.4 oz (77.7 kg)   SpO2 98%   BMI 29.42 kg/m   General: Alert and cooperative and appears to be in no acute distress Cardio: Normal S1 and S2, no S3 or S4. Rhythm is regular. No murmurs or rubs.   Pulm: Clear to auscultation bilaterally, no crackles, wheezing, or diminished breath sounds. Normal respiratory effort Abdomen: Bowel sounds normal. Abdomen soft and non-tender.  Extremities: Edema bilaterally up to the midshin, nonpitting.  Warm/ well perfused.  Strong radial pulses. Neuro: Cranial nerves grossly intact  ASSESSMENT/PLAN:   Bilateral lower extremity edema 66 year old female here with 2 weeks of acute bilateral lower extremity edema.  Differential includes CHF, anemia, edema secondary to calcium channel blocker, lymphedema. I am thinking her edema could be a side effect of her amlodipine.  Given her dyspnea, I am also leaning towards CHF.  However, her lung sounds are clear and she does have a chronic history of shortness of breath with pulmonary hypertension.   -We will obtain an echocardiogram since her last one was in 2021, and showed preserved EF. -Obtain BNP, CBC -Stop amlodipine -Start HCTZ 12 and  half milligrams daily, can uptitrate if needed depending on blood pressure -Encourage patient to call should she have weight gain at home consistently for the next week, or swelling does not improve.   Of note, patient has not been able to afford her Vyvanse or Myrbetriq since she had an insurance change from [employer] to [employer].  I will send a message to pharmacy team to see if they could help assist in looking into this.  Orvis Brill, [place]

## 2022-06-08 NOTE — Assessment & Plan Note (Signed)
66 year old female here with 2 weeks of acute bilateral lower extremity edema.  Differential includes CHF, anemia, edema secondary to calcium channel blocker, lymphedema. I am thinking her edema could be a side effect of her amlodipine.  Given her dyspnea, I am also leaning towards CHF.  However, her lung sounds are clear and she does have a chronic history of shortness of breath with pulmonary hypertension.   -We will obtain an echocardiogram since her last one was in 2021, and showed preserved EF. -Obtain BNP, CBC -Stop amlodipine -Start HCTZ 12 and half milligrams daily, can uptitrate if needed depending on blood pressure -Encourage patient to call should she have weight gain at home consistently for the next week, or swelling does not improve.

## 2022-06-09 LAB — CBC
Hematocrit: 39.1 % (ref 34.0–46.6)
Hemoglobin: 12.2 g/dL (ref 11.1–15.9)
MCH: 27.5 pg (ref 26.6–33.0)
MCHC: 31.2 g/dL — ABNORMAL LOW (ref 31.5–35.7)
MCV: 88 fL (ref 79–97)
Platelets: 253 10*3/uL (ref 150–450)
RBC: 4.44 x10E6/uL (ref 3.77–5.28)
RDW: 14.1 % (ref 11.7–15.4)
WBC: 8.1 10*3/uL (ref 3.4–10.8)

## 2022-06-09 LAB — BRAIN NATRIURETIC PEPTIDE: BNP: 28.8 pg/mL (ref 0.0–100.0)

## 2022-06-25 ENCOUNTER — Telehealth: Payer: Self-pay | Admitting: *Deleted

## 2022-06-25 ENCOUNTER — Ambulatory Visit (INDEPENDENT_AMBULATORY_CARE_PROVIDER_SITE_OTHER): Payer: BLUE CROSS/BLUE SHIELD

## 2022-06-25 DIAGNOSIS — Z23 Encounter for immunization: Secondary | ICD-10-CM

## 2022-06-25 NOTE — Progress Notes (Signed)
   Covid-19 Vaccination Clinic  Name:  Kathryn Hendricks    MRN: [id number] DOB: Jun 12, 1956  06/25/2022  Patient presents to nurse clinic for [place] vaccination. Administered in LD, site unremarkable, tolerated injection well.   [name] was observed post Covid-19 immunization for 15 minutes without incident. She was provided with Vaccine Information Sheet and instruction to access the V-Safe system.   [name] was instructed to call 911 with any severe reactions post vaccine: Difficulty breathing  Swelling of face and throat  A fast heartbeat  A bad rash all over body  Dizziness and weakness     Talbot Grumbling, RN

## 2022-06-25 NOTE — Telephone Encounter (Signed)
PA request faxed to [place] at [phone] for echo.  Kathryn Hendricks,CMA

## 2022-06-25 NOTE — Progress Notes (Signed)
Patient presents to nurse clinic for flu vaccination. Administered in RD, site unremarkable, tolerated injection well.   Talbot Grumbling, RN

## 2022-07-11 ENCOUNTER — Other Ambulatory Visit (HOSPITAL_COMMUNITY): Payer: Self-pay

## 2022-07-11 NOTE — Telephone Encounter (Signed)
Patient will no longer have BCBS and will get disability Dec 1 and then change to [facility]. She said she is having issues getting the medication covered. Is there any type of plan that can help woth payment? She almost had an accident because she is so tired. Thank you

## 2022-07-11 NOTE — Telephone Encounter (Signed)
See other telephone note/Vyvanse PA.

## 2022-07-11 NOTE — Telephone Encounter (Signed)
Called pt back. She has been out of Vyvanse for 2-3 months. She tried filling in September but cost was 150.00 with new insurance: BC[employer]. Advised we did get auth via [employer] effective from 05/10/2022 through 05/09/2023.   She thinks she tried filling before this and never tried filling after. She will call the pharmacy for them to re-run claim and see if cost is lower now that PA on file. She will call back if any other issues arise.

## 2022-07-11 NOTE — Telephone Encounter (Signed)
From Selinda Eon S/front desk: "Patient will no longer have BCBS and will get disability Dec 1 and then change to [facility]. She said she is having issues getting the medication covered. Is there any type of plan that can help woth payment? She almost had an accident because she is so tired. Thank you"   Called and LVM for pt to call office.  If she is referencing her Vyvanse, I found [employer] that may be able to help:  "Simple[employer]: apply online or call us at [phone]. One of our advocates will reach out to you within 24 hours for a telephone interview. During that interview, you'll be asked for the information we will need in order to match you up with the most suitable Vyvanse patient assistance programs for your situation.  We'll submit applications on your behalf to the programs we think are most likely to approve you based on your insurance coverage (or lack thereof) and your financial circumstances. Then, if you're approved, we'll help you get you enrolled right away so you can start saving money on your Vyvanse prescription."

## 2022-07-11 NOTE — Telephone Encounter (Signed)
Pt is asking for a call back in response to what Terrence Dupont, RN needed to relay

## 2022-07-16 ENCOUNTER — Ambulatory Visit: Payer: BLUE CROSS/BLUE SHIELD | Admitting: Family Medicine

## 2022-07-16 ENCOUNTER — Other Ambulatory Visit (HOSPITAL_COMMUNITY): Payer: Self-pay

## 2022-07-16 ENCOUNTER — Other Ambulatory Visit: Payer: Self-pay | Admitting: Student

## 2022-07-17 ENCOUNTER — Encounter: Payer: Self-pay | Admitting: Family Medicine

## 2022-07-17 ENCOUNTER — Ambulatory Visit (INDEPENDENT_AMBULATORY_CARE_PROVIDER_SITE_OTHER): Payer: BLUE CROSS/BLUE SHIELD | Admitting: Family Medicine

## 2022-07-17 ENCOUNTER — Other Ambulatory Visit (HOSPITAL_COMMUNITY): Payer: Self-pay

## 2022-07-17 VITALS — BP 145/78 | HR 58 | Ht 64.0 in

## 2022-07-17 DIAGNOSIS — R600 Localized edema: Secondary | ICD-10-CM

## 2022-07-17 DIAGNOSIS — I10 Essential (primary) hypertension: Secondary | ICD-10-CM

## 2022-07-17 MED ORDER — HYDROCHLOROTHIAZIDE 12.5 MG PO TABS
12.5000 mg | ORAL_TABLET | Freq: Every day | ORAL | 3 refills | Status: DC
  Filled 2022-07-17: qty 30, 30d supply, fill #0
  Filled 2022-08-28: qty 30, 30d supply, fill #1
  Filled 2022-10-02: qty 30, 30d supply, fill #2
  Filled 2022-11-12: qty 30, 30d supply, fill #3

## 2022-07-17 NOTE — Progress Notes (Signed)
    SUBJECTIVE:   CHIEF COMPLAINT / HPI:   HTN:  She was started on HCTZ 12.5 mg last week and has been taking one tablet daily due to her confusion about the dosing instructions. She ran out of her HCTZ 5 days ago. She has been compliant with Lisinopril 30 mg QD. No new concerns.   Edema: She is here to follow up for ankle edema, which has improved since she started HCTZ. No new concerns.  PERTINENT  PMH / PSH: PMHx reviewed  OBJECTIVE:   Vitals:   07/17/22 0954 07/17/22 1010  BP: (!) 140/76 (!) 145/78  Pulse: 65 (!) 58  SpO2: 100%   Height: '5\' 4"'$  (1.626 m)     Physical Exam Vitals and nursing note reviewed.  Cardiovascular:     Rate and Rhythm: Normal rate and regular rhythm.     Pulses: Normal pulses.     Heart sounds: Normal heart sounds. No murmur heard. Pulmonary:     Effort: Pulmonary effort is normal. No respiratory distress.     Breath sounds: Normal breath sounds. No wheezing.  Musculoskeletal:     Comments: No edema of her right ankle. Trace edema of her left ankle.       ASSESSMENT/PLAN:   HYPERTENSION, BENIGN Continue Lisinopril 30 mg QD and HCTZ 12.5 mg QD. Monitor BP closely at home. Refills provided.   Bilateral lower extremity edema Ankle edema improved on HCTZ 12.5 mg QD. Continue same. She deferred ECHO test due to insurance coverage.  I discussed reduce salt intake, LL elevation and reduction in salt intake. She verbalized understanding.      Andrena Mews, MD [place]

## 2022-07-17 NOTE — Assessment & Plan Note (Signed)
Continue Lisinopril 30 mg QD and HCTZ 12.5 mg QD. Monitor BP closely at home. Refills provided.

## 2022-07-17 NOTE — Assessment & Plan Note (Signed)
Ankle edema improved on HCTZ 12.5 mg QD. Continue same. She deferred ECHO test due to insurance coverage.  I discussed reduce salt intake, LL elevation and reduction in salt intake. She verbalized understanding.

## 2022-07-17 NOTE — Patient Instructions (Signed)
Edema ? ?Edema is when you have too much fluid in your body or under your skin. Edema may make your legs, feet, and ankles swell. Swelling often happens in looser tissues, such as around your eyes. This is a common condition. It gets more common as you get older. ?There are many possible causes of edema. These include: ?Eating too much salt (sodium). ?Being on your feet or sitting for a long time. ?Certain medical conditions, such as: ?Pregnancy. ?Heart failure. ?Liver disease. ?Kidney disease. ?Cancer. ?Hot weather may make edema worse. Edema is usually painless. Your skin may look swollen or shiny. ?Follow these instructions at home: ?Medicines ?Take over-the-counter and prescription medicines only as told by your doctor. ?Your doctor may prescribe a medicine to help your body get rid of extra water (diuretic). Take this medicine if you are told to take it. ?Eating and drinking ?Eat a low-salt (low-sodium) diet as told by your doctor. Sometimes, eating less salt may reduce swelling. ?Depending on the cause of your swelling, you may need to limit how much fluid you drink (fluid restriction). ?General instructions ?Raise the injured area above the level of your heart while you are sitting or lying down. ?Do not sit still or stand for a long time. ?Do not wear tight clothes. Do not wear garters on your upper legs. ?Exercise your legs. This can help the swelling go down. ?Wear compression stockings as told by your doctor. It is important that these are the right size. These should be prescribed by your doctor to prevent possible injuries. ?If elastic bandages or wraps are recommended, use them as told by your doctor. ?Contact a doctor if: ?Treatment is not working. ?You have heart, liver, or kidney disease and have symptoms of edema. ?You have sudden and unexplained weight gain. ?Get help right away if: ?You have shortness of breath or chest pain. ?You cannot breathe when you lie down. ?You have pain, redness, or  warmth in the swollen areas. ?You have heart, liver, or kidney disease and get edema all of a sudden. ?You have a fever and your symptoms get worse all of a sudden. ?These symptoms may be an emergency. Get help right away. Call 911. ?Do not wait to see if the symptoms will go away. ?Do not drive yourself to the hospital. ?Summary ?Edema is when you have too much fluid in your body or under your skin. ?Edema may make your legs, feet, and ankles swell. Swelling often happens in looser tissues, such as around your eyes. ?Raise the injured area above the level of your heart while you are sitting or lying down. ?Follow your doctor's instructions about diet and how much fluid you can drink. ?This information is not intended to replace advice given to you by your health care provider. Make sure you discuss any questions you have with your health care provider. ?Document Revised: 04/17/2021 Document Reviewed: 04/17/2021 ?Elsevier Patient Education ? 2023 Elsevier Inc. ? ?

## 2022-08-02 ENCOUNTER — Telehealth: Payer: Self-pay | Admitting: Family Medicine

## 2022-08-02 NOTE — Telephone Encounter (Signed)
Pt reschedule note to try to get in earlier than March

## 2022-08-07 ENCOUNTER — Other Ambulatory Visit: Payer: Self-pay | Admitting: Family Medicine

## 2022-08-07 ENCOUNTER — Other Ambulatory Visit (HOSPITAL_COMMUNITY): Payer: Self-pay

## 2022-08-07 MED ORDER — LISINOPRIL 30 MG PO TABS
30.0000 mg | ORAL_TABLET | Freq: Every day | ORAL | 1 refills | Status: DC
  Filled 2022-08-07: qty 90, 90d supply, fill #0
  Filled 2022-11-12: qty 90, 90d supply, fill #1

## 2022-08-07 MED ORDER — ROSUVASTATIN CALCIUM 10 MG PO TABS
10.0000 mg | ORAL_TABLET | Freq: Every day | ORAL | 1 refills | Status: DC
  Filled 2022-08-07: qty 90, 90d supply, fill #0
  Filled 2022-11-12: qty 90, 90d supply, fill #1

## 2022-08-08 ENCOUNTER — Other Ambulatory Visit (HOSPITAL_COMMUNITY): Payer: Self-pay

## 2022-08-14 ENCOUNTER — Other Ambulatory Visit (HOSPITAL_COMMUNITY): Payer: Self-pay

## 2022-08-14 ENCOUNTER — Ambulatory Visit (INDEPENDENT_AMBULATORY_CARE_PROVIDER_SITE_OTHER): Payer: Medicaid Other | Admitting: Family Medicine

## 2022-08-14 VITALS — BP 110/62 | HR 80 | Wt 170.0 lb

## 2022-08-14 DIAGNOSIS — R059 Cough, unspecified: Secondary | ICD-10-CM | POA: Insufficient documentation

## 2022-08-14 DIAGNOSIS — J4520 Mild intermittent asthma, uncomplicated: Secondary | ICD-10-CM

## 2022-08-14 DIAGNOSIS — R051 Acute cough: Secondary | ICD-10-CM | POA: Diagnosis present

## 2022-08-14 MED ORDER — FLUTICASONE PROPIONATE HFA 110 MCG/ACT IN AERO
2.0000 | INHALATION_SPRAY | Freq: Two times a day (BID) | RESPIRATORY_TRACT | 12 refills | Status: DC | PRN
  Filled 2022-08-14 – 2022-11-12 (×2): qty 12, 30d supply, fill #0
  Filled 2022-12-20: qty 12, 30d supply, fill #1

## 2022-08-14 MED ORDER — ALBUTEROL SULFATE HFA 108 (90 BASE) MCG/ACT IN AERS
2.0000 | INHALATION_SPRAY | Freq: Four times a day (QID) | RESPIRATORY_TRACT | 1 refills | Status: AC | PRN
  Filled 2022-08-14: qty 18, 17d supply, fill #0
  Filled 2022-11-12: qty 18, 25d supply, fill #1

## 2022-08-14 NOTE — Progress Notes (Signed)
    SUBJECTIVE:   CHIEF COMPLAINT / HPI:   Patient presents with productive cough with clear phlegm, congestion, rhinorrhea, fever (Tmax 100.1) and intermittent chills. These symptoms have been ongoing for the past 3 weeks which she reports that her symptoms have improved a lot to the point where her nose is sore from all the blowing and still having a linger cough. Denies dyspnea and chest pain. History of mild asthma, uses flovent inhaler daily since she's been sick but otherwise uses it as needed. Since she has been ill, she has also used the albuterol thus far 3 times total. Sick contacts include grandchildren and oldest daughter. Grandchildren attends school.   OBJECTIVE:   BP 110/62   Pulse 80   Wt 170 lb (77.1 kg)   SpO2 99%   BMI 29.18 kg/m   General: Patient tired appearing, in no acute distress. HEENT: no evidence of cervical LAD CV: RRR, no murmurs or gallops auscultated Resp: CTAB, no wheezing, rales or rhonchi noted  ASSESSMENT/PLAN:   Cough -acute in the setting of viral etiology, improving -reassurance provided as she likely got infection from grandchildren who may have caught something at school -conservative measures discussed including regular use of honey -reassuringly no dyspnea in the setting of history of mild intermittent asthma, flovent and albuterol inhaler refills provided with instruction to use flovent daily regardless of symptoms for maintenance  -ED precautions discussed     Donney Dice, [place]

## 2022-08-14 NOTE — Patient Instructions (Signed)
It was great seeing you today!  Today we discussed your symptoms, I am sorry that you are not completely back to normal but I am glad that your symptoms have improved a significant amount. It is likely that your symptoms are due to a viral infection. Unfortunately, the cough from a viral infection often lingers so this may take more time to completely resolve. Honey and warm fluids can help as well.   I have sent in refills of your flovent and albuterol inhalers, please use the flovent daily and use the albuterol as needed.   If you start to have shortness of breath or chest pain then please go to the emergency department.   Please follow up at your next scheduled appointment, if anything arises between now and then, please don't hesitate to contact our office.   Thank you for allowing [place] to be a part of your medical care!  Thank you, Dr. Larae Grooms  Also a reminder of our clinic's no-show policy. Please make sure to arrive at least 15 minutes prior to your scheduled appointment time. Please try to cancel before 24 hours if you are not able to make it. If you no-show for 2 appointments then you will be receiving a warning letter. If you no-show after 3 visits, then you may be at risk of being dismissed from our clinic. This is to ensure that everyone is able to be seen in a timely manner. Thank you, we appreciate your assistance with this!

## 2022-08-14 NOTE — Assessment & Plan Note (Signed)
-  acute in the setting of viral etiology, improving -reassurance provided as she likely got infection from grandchildren who may have caught something at school -conservative measures discussed including regular use of honey -reassuringly no dyspnea in the setting of history of mild intermittent asthma, flovent and albuterol inhaler refills provided with instruction to use flovent daily regardless of symptoms for maintenance  -ED precautions discussed

## 2022-08-21 ENCOUNTER — Other Ambulatory Visit (HOSPITAL_COMMUNITY): Payer: Self-pay

## 2022-08-22 ENCOUNTER — Telehealth: Payer: Self-pay

## 2022-08-22 ENCOUNTER — Other Ambulatory Visit (HOSPITAL_COMMUNITY): Payer: Self-pay

## 2022-08-22 NOTE — Telephone Encounter (Signed)
Patient called because she hasn't been able to fill her Mirabegron ER 50 mg TB24 for awhile because of insurance.  Now she has Medicaid (she was confused if it was Medicaid or Medicare) and she wants to try to get Rx but pharmacy told her it needs PA.  I spoke with Anderson Malta and she has started the PA but it has not been completed because of issue with ins co. She called and spoke with the pharmacy and the ins card they have on file is [employer] [facility] with an effective date 08/27/2022.    I called patient back and left message that she needs to provide the pharmacy with her current insurance card and let them run the Rx through on that.

## 2022-08-28 ENCOUNTER — Other Ambulatory Visit: Payer: Self-pay

## 2022-08-28 ENCOUNTER — Other Ambulatory Visit (HOSPITAL_COMMUNITY): Payer: Self-pay

## 2022-08-31 ENCOUNTER — Telehealth: Payer: Self-pay | Admitting: Family Medicine

## 2022-08-31 NOTE — Telephone Encounter (Signed)
Clinical info completed on Disability  form.  Placed form in PCP's box for completion.    When form is completed, please route note to "RN Team" and place in wall pocket in front office.   Salvatore Marvel, CMA

## 2022-08-31 NOTE — Telephone Encounter (Signed)
Patient fropped off disability form to be completed. Last DOS was 08/14/22. Placed in [employer].

## 2022-09-03 NOTE — Telephone Encounter (Signed)
Called patient and informed. Paperwork placed at front desk for patient to pick up.   Talbot Grumbling, RN

## 2022-10-02 ENCOUNTER — Telehealth: Payer: Self-pay

## 2022-10-02 ENCOUNTER — Other Ambulatory Visit (HOSPITAL_COMMUNITY): Payer: Self-pay

## 2022-10-02 ENCOUNTER — Other Ambulatory Visit: Payer: Self-pay

## 2022-10-02 ENCOUNTER — Other Ambulatory Visit (HOSPITAL_BASED_OUTPATIENT_CLINIC_OR_DEPARTMENT_OTHER): Payer: Self-pay

## 2022-10-02 NOTE — Telephone Encounter (Signed)
[place] called stating patient needing PA for Myrbetriq.  PA started in Cover My Meds.

## 2022-10-04 ENCOUNTER — Other Ambulatory Visit (HOSPITAL_COMMUNITY): Payer: Self-pay

## 2022-10-04 ENCOUNTER — Telehealth: Payer: Self-pay | Admitting: Orthopedic Surgery

## 2022-10-05 ENCOUNTER — Other Ambulatory Visit (HOSPITAL_COMMUNITY): Payer: Self-pay

## 2022-10-08 ENCOUNTER — Other Ambulatory Visit (HOSPITAL_COMMUNITY): Payer: Self-pay

## 2022-10-10 ENCOUNTER — Other Ambulatory Visit (HOSPITAL_COMMUNITY): Payer: Self-pay

## 2022-10-12 ENCOUNTER — Other Ambulatory Visit (HOSPITAL_COMMUNITY): Payer: Self-pay

## 2022-10-15 ENCOUNTER — Telehealth: Payer: Self-pay | Admitting: Obstetrics and Gynecology

## 2022-10-15 ENCOUNTER — Other Ambulatory Visit (HOSPITAL_COMMUNITY): Payer: Self-pay

## 2022-10-15 NOTE — Telephone Encounter (Signed)
See telephone encounter dated 10/02/22.   Encounter closed.

## 2022-10-15 NOTE — Telephone Encounter (Signed)
Kathryn Hendricks  [facility] Rx Refill; [facility] Triage21 minutes ago (3:20 PM)   MM Call from patient requesting medication be changed to a generic  brand first so her insurance will cover it and she can fill the Rx. Bladder medication, name unknown.  Patient is requesting a call back to confirm receipt of this call/message. Request that the pharmacy & insurance company be contacted as well.  [place]  Per [email or web address] PA denied 10/02/22.    Call placed to [facility] at [phone], spoke with Lattie Haw.  Covered alternatives provided: oxybutynin ER, solifenacin, toviaz.   Dr. Talbert Nan -please advise on alternative.    Call placed to patient to provide update. Patient has been on medication since 12/2020. Resubmitted PA with additional information. Advised I will send to Dr. Talbert Nan to review alternatives. Our office will return call once reviewed. Patient agreeable and appreciative of call.

## 2022-10-16 ENCOUNTER — Other Ambulatory Visit (HOSPITAL_COMMUNITY): Payer: Self-pay

## 2022-10-16 MED ORDER — SOLIFENACIN SUCCINATE 5 MG PO TABS
5.0000 mg | ORAL_TABLET | Freq: Every day | ORAL | 1 refills | Status: DC
  Filled 2022-10-16: qty 30, 30d supply, fill #0
  Filled 2022-12-14: qty 30, 30d supply, fill #1

## 2022-10-16 NOTE — Telephone Encounter (Signed)
There is not a generic medication she can take that is in the same category as the Myrbetriq she is on. She can try a different type of medication.  Please offer her Solifenacin (Vesicare), 5 mg a day. The biggest side effects of this category of medication are constipation and dry mouth. If she wants to try it, please call in a 30 day supply with one refill. She is due for an annual exam, we can f/u then. She may need an adjustment in her dose.

## 2022-10-16 NOTE — Telephone Encounter (Signed)
Denied on February 19 Request Reference Number: [id number]. MYRBETRIQ TAB 50MG is denied for not meeting the prior authorization requirement(s). For further questions, call [place] at [phone] for more information.   Spoke with patient, advised per Dr. Talbert Nan.  Patient agreeable to recommendations, Rx sent. Patient is scheduled for AEX 12/19/22. Patient verbalizes understanding and agreeable.   Routing to provider for final review. Patient is agreeable to disposition. Will close encounter.

## 2022-10-18 ENCOUNTER — Other Ambulatory Visit (HOSPITAL_COMMUNITY): Payer: Self-pay

## 2022-10-30 ENCOUNTER — Encounter: Payer: Self-pay | Admitting: Family Medicine

## 2022-10-30 ENCOUNTER — Ambulatory Visit (INDEPENDENT_AMBULATORY_CARE_PROVIDER_SITE_OTHER): Payer: Medicare Other | Admitting: Family Medicine

## 2022-10-30 ENCOUNTER — Other Ambulatory Visit (HOSPITAL_COMMUNITY): Payer: Self-pay

## 2022-10-30 VITALS — BP 134/77 | HR 68 | Ht 64.0 in | Wt 177.2 lb

## 2022-10-30 DIAGNOSIS — Z91199 Patient's noncompliance with other medical treatment and regimen due to unspecified reason: Secondary | ICD-10-CM | POA: Diagnosis not present

## 2022-10-30 DIAGNOSIS — R413 Other amnesia: Secondary | ICD-10-CM

## 2022-10-30 DIAGNOSIS — G47411 Narcolepsy with cataplexy: Secondary | ICD-10-CM

## 2022-10-30 DIAGNOSIS — R41840 Attention and concentration deficit: Secondary | ICD-10-CM

## 2022-10-30 DIAGNOSIS — G3184 Mild cognitive impairment, so stated: Secondary | ICD-10-CM

## 2022-10-30 DIAGNOSIS — G4719 Other hypersomnia: Secondary | ICD-10-CM

## 2022-10-30 MED ORDER — LISDEXAMFETAMINE DIMESYLATE 70 MG PO CAPS
70.0000 mg | ORAL_CAPSULE | Freq: Every day | ORAL | 0 refills | Status: DC
  Filled 2022-10-30: qty 30, 30d supply, fill #0

## 2022-10-30 NOTE — Patient Instructions (Addendum)
Below is our plan:  We will continue Vyvanse '70mg'$  daily. I have called in a new rx. Please let me know if you have trouble getting this medication.   I will document findings from your disability evaluation for memory loss. Please continue memory compensation strategies. Please talk to your primary care provider about any concerns of anxiety.   Please continue using your CPAP regularly. While your insurance requires that you use CPAP at least 4 hours each night on 70% of the nights, I recommend, that you not skip any nights and use it throughout the night if you can. Getting used to CPAP and staying with the treatment long term does take time and patience and discipline. Untreated obstructive sleep apnea when it is moderate to severe can have an adverse impact on cardiovascular health and raise her risk for heart disease, arrhythmias, hypertension, congestive heart failure, stroke and diabetes. Untreated obstructive sleep apnea causes sleep disruption, nonrestorative sleep, and sleep deprivation. This can have an impact on your day to day functioning and cause daytime sleepiness and impairment of cognitive function, memory loss, mood disturbance, and problems focussing. Using CPAP regularly can improve these symptoms.  Please make sure you are staying well hydrated. I recommend 50-60 ounces daily. Well balanced diet and regular exercise encouraged. Consistent sleep schedule with 6-8 hours recommended.   Please continue follow up with care team as directed.   Follow up with me in 6 months   You may receive a survey regarding today's visit. I encourage you to leave honest feed back as I do use this information to improve patient care. Thank you for seeing me today!   Management of Memory Problems   There are some general things you can do to help manage your memory problems.  Your memory may not in fact recover, but by using techniques and strategies you will be able to manage your memory  difficulties better.   1)  Establish a routine. Try to establish and then stick to a regular routine.  By doing this, you will get used to what to expect and you will reduce the need to rely on your memory.  Also, try to do things at the same time of day, such as taking your medication or checking your calendar first thing in the morning. Think about think that you can do as a part of a regular routine and make a list.  Then enter them into a daily planner to remind you.  This will help you establish a routine.   2)  Organize your environment. Organize your environment so that it is uncluttered.  Decrease visual stimulation.  Place everyday items such as keys or cell phone in the same place every day (ie.  Basket next to front door) Use post it notes with a brief message to yourself (ie. Turn off light, lock the door) Use labels to indicate where things go (ie. Which cupboards are for food, dishes, etc.) Keep a notepad and pen by the telephone to take messages   3)  Memory Aids A diary or journal/notebook/daily planner Making a list (shopping list, chore list, to do list that needs to be done) Using an alarm as a reminder (kitchen timer or cell phone alarm) Using cell phone to store information (Notes, Calendar, Reminders) Calendar/White board placed in a prominent position Post-it notes   In order for memory aids to be useful, you need to have good habits.  It's no good remembering to make a note in your  journal if you don't remember to look in it.  Try setting aside a certain time of day to look in journal.   4)  Improving mood and managing fatigue. There may be other factors that contribute to memory difficulties.  Factors, such as anxiety, depression and tiredness can affect memory. Regular gentle exercise can help improve your mood and give you more energy. Exercise: there are short videos created by the [employer] on Health specially for older adults: https://bit.ly/2I30q97.   Mediterranean diet: which emphasizes fruits, vegetables, whole grains, legumes, fish, and other seafood; unsaturated fats such as olive oils; and low amounts of red meat, eggs, and sweets. A variation of this, called MIND ([place] Intervention for Neurodegenerative Delay) incorporates the DASH (Dietary Approaches to Stop Hypertension) diet, which has been shown to lower high blood pressure, a risk factor for Alzheimer's disease. More information at: RepublicForum.gl.  Aerobic exercise that improve heart health is also good for the mind.  [employer] on Aging have short videos for exercises that you can do at home: GoldCloset.com.ee Simple relaxation techniques may help relieve symptoms of anxiety Try to get back to completing activities or hobbies you enjoyed doing in the past. Learn to pace yourself through activities to decrease fatigue. Find out about some local support groups where you can share experiences with others. Try and achieve 7-8 hours of sleep at night.

## 2022-10-30 NOTE — Progress Notes (Signed)
PATIENT: Kathryn Hendricks DOB: 1955/11/20  REASON FOR VISIT: follow up HISTORY FROM: patient  Chief Complaint  Patient presents with   Follow-up    RM EMG 3, alone. She has been out of her Vyvanse, now has [employer].     HISTORY OF PRESENT ILLNESS:  10/30/22 ALL:  Kathryn Hendricks returns for follow up for OSA non compliant with CPAP use, hypersomnia versus narcolepsy (negative HLA but positive MSLT in 2014) and memory loss.   She was continued on Vyvanse '70mg'$  daily but reports not being able to get it due to cost for the past 7 months. She has been more tired and sleepy. She is now covered with [place] health Medicare and Medicaid.   She was referred to Dr Sima Matas for formal neurocognitive testing. She reports not hearing back regarding an appointment. She was approved for social security disability. She reports having three different memory test but unsure of the results. She is trying read more and doing crossword puzzles.   She has resumed CPAP therapy. She has tried using it every night for the past week.     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 She reports still being kept awake by CPAP and is therefor not in compliance.  She also may need attention and neurocognitive testing: CPAP compliance on average is 2 hours and 6 minutes she has used the machine 35 out of 90 days and over 4 hours 28 out of 90 days.  Minimum pressure was 5 maximum pressure of 10 cm of water without EPR if she has the ability to change humidification herself, residual AHI was 7.2 more central than obstructive apneas remaining 95th percentile pressure was 9.8 cm water air leak at the 95th percentile were 14.6 L/min. Nasal pillows were used. She has claustrophobia.   09/11/2021 ALL: Kathryn Hendricks is a 67 y.o. female here today for follow up for OSA on CPAP and narcolepsy with cataplexy.  HLA testing negative, MSLT positive 2014.    She continues to have difficulty with CPAP compliance. Dr Dohmeier adjusted tubing temperature, reduced humidity and fitted her for new mask at last appt. She reports that she can not tolerate pressure settings. She feels that her nose burns with use. She does not feeling better and less tired when she uses CPAP. She feels less forgetful when using CPAP all night.   At last visit with Dr Brett Fairy, Adderall was discontinued and modafinil started. It is unclear from notes if she took modafinil or when medication was switched to Vyvanse. She thinks she "had problems with modafinil". She was switched to Vyvanse in 03/2021. Renate called the office 04/24/2021 reporting Vyvanse '20mg'$  not effective. She had increased to '40mg'$  and had not noted any improvement. Dose was increased to '70mg'$ . She feels that it may be working better than Adderall. She reports that some days are better than others. She gets very tired during the day. She is no longer working due to shoulder injury and not as active. She falls asleep if she sits for prolonged periods of time.   She was advised to follow up with PCP for request to have formal attention deficit testing. She has not yet reached out. She feels that she is more withdrawn and not wanting to be around people. She has lost some family members around Christmas and feels more depressed. No SI. She reports symptoms seem to be improving with  time. She continues to worry about her memory. She has more trouble remembering how to spell words that she feels should be easy to spell, ie. girl or succeed.   She lives alone. She manages her home and medications independently. She is able to perform ADLs independently. She drives without difficulty. No concerns of falling asleep while driving. She reports having a difficult time in school. She was in special ed classes. Learning has always been difficult for her. Her mother was diagnosed with dementia.     HISTORY: (copied from Dr  Dohmeier's previous note)  03-08-2021:  Kathryn Hendricks reports that she has difficulties with her rotator cuff and biceps tendon she had a surgical repair but has not healed as she would like.  So she has been out of work for the last 6 months.  Her Epworth Sleepiness Scale has remained highly elevated at 17 out of 24 points and her explanation was that she cannot use her CPAP reliably.  Apparently it feels to her as if there is much too much pressure suddenly imposed on her.  So she has not been using it recently and it is waking her up.  The DME most telling her to bring in her CPAP for sleep for adjustments.  The patient reports a degree of sleepiness that may not all be up to obstructive sleep apnea but to her underlying condition of persistent hypersomnia.  The patient was diagnosed in November 2020 with sleep apnea it was a very mild sleep apnea with an AHI of 7.3/h the REM AHI was 19.4 and her sleep appeared fragmented snoring was present but oxygen saturation was stable throughout the night.  I have ordered an auto titration CPAP between 5 and 12 cmH2O with 1 cm EPR and we had discussed alternatives I wonder if she may want to try a dental device given that her apnea is REM dependent.     Given her very high Epworth sleepiness scores and the difficulties with CPAP use it may be worse while reducing her AHI is somewhat rather than not treating it at all.   REVIEW OF SYSTEMS: Out of a complete 14 system review of symptoms, the patient complains only of the following symptoms, memory loss, daytime sleepiness, inattention, and all other reviewed systems are negative.  ESS: 10/24  ALLERGIES: Allergies  Allergen Reactions   Penicillins Shortness Of Breath, Itching and Swelling    Has patient had a PCN reaction causing immediate rash, facial/tongue/throat swelling, SOB or lightheadedness with hypotension: Yes Has patient had a PCN reaction causing severe rash involving mucus membranes or skin necrosis:  No Has patient had a PCN reaction that required hospitalization: No Has patient had a PCN reaction occurring within the last 10 years: No If all of the above answers are "NO", then may proceed with Cephalosporin use.     HOME MEDICATIONS: Outpatient Medications Prior to Visit  Medication Sig Dispense Refill   acetaminophen (TYLENOL) 500 MG tablet Take 500 mg by mouth 2 (two) times daily as needed.     albuterol (VENTOLIN HFA) 108 (90 Base) MCG/ACT inhaler Inhale 2 puffs into the lungs every 6 (six) hours as needed for wheezing or shortness of breath. 18 g 1   fluticasone (FLONASE) 50 MCG/ACT nasal spray Place 1-2 sprays into both nostrils daily. 16 g 11   fluticasone (FLOVENT HFA) 110 MCG/ACT inhaler Inhale 2 puffs into the lungs 2 (two) times daily as needed. 12 g 12   hydrochlorothiazide (HYDRODIURIL) 12.5 MG tablet  Take 1 tablet (12.5 mg total) by mouth daily. 30 tablet 3   ipratropium (ATROVENT) 0.03 % nasal spray Place 2 sprays into both nostrils 4 (four) times daily as needed for rhinitis 30 mL 3   lisinopril (ZESTRIL) 30 MG tablet Take 1 tablet (30 mg total) by mouth at bedtime. 90 tablet 1   montelukast (SINGULAIR) 10 MG tablet Take 1 tablet (10 mg total) by mouth at bedtime. 30 tablet 11   rosuvastatin (CRESTOR) 10 MG tablet Take 1 tablet (10 mg total) by mouth daily. 90 tablet 1   solifenacin (VESICARE) 5 MG tablet Take 1 tablet (5 mg total) by mouth daily. 30 tablet 1   lisdexamfetamine (VYVANSE) 70 MG capsule Take 1 capsule (70 mg total) by mouth daily. 30 capsule 0   No facility-administered medications prior to visit.    PAST MEDICAL HISTORY: Past Medical History:  Diagnosis Date   Abdominal pain, left lower quadrant 11/08/2015   Abnormal Pap smear 2011   Anxiety    Arthritis    lower back and shoulders   Bronchitis    hx - used abluterol inhaler   Chronic diastolic heart failure (HCC)    Cognitive decline    FIBROIDS, UTERUS 07/02/2009   Qualifier: Diagnosis of  By:  Ta MD, Cat     GERD (gastroesophageal reflux disease)    occasional   Hyperlipidemia    Hypersomnia, persistent 11/05/2017   Hypertension    Left shoulder pain 02/14/2016   Memory loss 01/30/2022   Narcolepsy    Osteopenia 04/2019   T score -1.7 FRAX 3.4% / 0.2%   Premature atrial contractions    PVC's (premature ventricular contractions)    Seasonal allergies    SVD (spontaneous vaginal delivery)    x 3   Weight gain 01/19/2021    PAST SURGICAL HISTORY: Past Surgical History:  Procedure Laterality Date   ANTERIOR AND POSTERIOR REPAIR N/A 02/19/2017   Procedure: ANTERIOR (CYSTOCELE);  Surgeon: Bjorn Loser, MD;  Location: [facility];  Service: Urology;  Laterality: N/A;   APPENDECTOMY     COLONOSCOPY     CYSTOSCOPY N/A 02/19/2017   Procedure: CYSTOSCOPY;  Surgeon: Bjorn Loser, MD;  Location: [facility];  Service: Urology;  Laterality: N/A;   DILATION AND CURETTAGE OF UTERUS  2010   HYSTEROSCOPY W/ ENDOMETRIAL ABLATION  2010   SALPINGOOPHORECTOMY Bilateral 02/19/2017   Procedure: SALPINGO OOPHORECTOMY;  Surgeon: Anastasio Auerbach, MD;  Location: [facility];  Service: Gynecology;  Laterality: Bilateral;   TUBAL LIGATION     VAGINAL HYSTERECTOMY N/A 02/19/2017   Procedure: HYSTERECTOMY VAGINAL ,  BSO.;  Surgeon: Anastasio Auerbach, MD;  Location: [facility];  Service: Gynecology;  Laterality: N/A;  DO NOT OPEN LAP INSTRUMENTS   WISDOM TOOTH EXTRACTION      FAMILY HISTORY: Family History  Problem Relation Age of Onset   Hypertension Mother    Diabetes Mother    Stroke Mother    CAD Mother 67   Narcolepsy Mother        not diagnosed   Hypertension Sister    Cancer Sister        Colon cancer   Hypertension Brother    Cancer Brother        throat   Diabetes Father    Heart disease Father     SOCIAL HISTORY: Social History   Socioeconomic History   Marital status: Divorced    Spouse name: Not on file   Number of children: 3  Years of education: 102   Highest education  level: Not on file  Occupational History   Occupation: Housekeeping    Employer: [employer]  Tobacco Use   Smoking status: Never    Passive exposure: Never   Smokeless tobacco: Never  Vaping Use   Vaping Use: Never used  Substance and Sexual Activity   Alcohol use: No    Alcohol/week: 0.0 standard drinks of alcohol   Drug use: No   Sexual activity: Never    Birth control/protection: Post-menopausal, Surgical    Comment: 1st intercourse 67 yo-5 partners  Other Topics Concern   Not on file  Social History Narrative   Caffeine 1 cup coffee in am.   Social Determinants of Health   Financial Resource Strain: Not on file  Food Insecurity: Not on file  Transportation Needs: Not on file  Physical Activity: Not on file  Stress: Not on file  Social Connections: Not on file  Intimate Partner Violence: Not on file     PHYSICAL EXAM  Vitals:   10/30/22 1433  BP: 134/77  Pulse: 68  Weight: 177 lb 3.2 oz (80.4 kg)  Height: '5\' 4"'$  (1.626 m)    Body mass index is 30.42 kg/m.  Generalized: Well developed, in no acute distress  Cardiology: normal rate and rhythm, no murmur noted Respiratory: clear to auscultation bilaterally  Neurological examination  Mentation: Alert oriented to time, place, history taking. Follows all commands speech and language fluent Cranial nerve II-XII: Pupils were equal round reactive to light. Extraocular movements were full, visual field were full  Motor: The motor testing reveals 5 over 5 strength of all 4 extremities. Good symmetric motor tone is noted throughout.  Gait and station: Gait is normal.    DIAGNOSTIC DATA (LABS, IMAGING, TESTING) - I reviewed patient records, labs, notes, testing and imaging myself where available.     10/30/2022    2:40 PM 09/11/2021    1:14 PM 03/05/2017    8:36 AM  MMSE - Mini Mental State Exam  Orientation to time '5 5 4  '$ Orientation to Place '5 5 5  '$ Registration '3 3 3  '$ Attention/ Calculation 0 0 0  Recall '2 2  1  '$ Language- name 2 objects '2 2 2  '$ Language- repeat 0 0 0  Language- follow 3 step command '3 2 3  '$ Language- read & follow direction '1 1 1  '$ Write a sentence 0 1 0  Copy design 0 0 1  Total score '21 21 20     '$ Lab Results  Component Value Date   WBC 8.1 06/08/2022   HGB 12.2 06/08/2022   HCT 39.1 06/08/2022   MCV 88 06/08/2022   PLT 253 06/08/2022      Component Value Date/Time   NA 144 01/30/2022 1044   K 4.2 01/30/2022 1044   CL 105 01/30/2022 1044   CO2 24 01/30/2022 1044   GLUCOSE 90 01/30/2022 1044   GLUCOSE 89 02/07/2017 1100   BUN 10 01/30/2022 1044   CREATININE 0.68 01/30/2022 1044   CREATININE 0.68 04/27/2016 1558   CALCIUM 8.7 01/30/2022 1044   PROT 6.6 02/23/2020 1123   ALBUMIN 4.3 02/23/2020 1123   AST 20 02/23/2020 1123   ALT 19 02/23/2020 1123   ALKPHOS 106 02/23/2020 1123   BILITOT 0.3 02/23/2020 1123   GFRNONAA 94 02/23/2020 1123   GFRNONAA >89 09/10/2013 1136   GFRAA 108 02/23/2020 1123   GFRAA >89 09/10/2013 1136   Lab Results  Component Value  Date   CHOL 132 01/30/2022   HDL 44 01/30/2022   LDLCALC 71 01/30/2022   LDLDIRECT 136 (H) 08/09/2008   TRIG 87 01/30/2022   CHOLHDL 3.0 01/30/2022   Lab Results  Component Value Date   HGBA1C 5.6 06/18/2017   Lab Results  Component Value Date   VITAMINB12 650 01/30/2022   Lab Results  Component Value Date   TSH 1.260 01/30/2022     ASSESSMENT AND PLAN 67 y.o. year old female  has a past medical history of Abdominal pain, left lower quadrant (11/08/2015), Abnormal Pap smear (2011), Anxiety, Arthritis, Bronchitis, Chronic diastolic heart failure ([place]), Cognitive decline, FIBROIDS, UTERUS (07/02/2009), GERD (gastroesophageal reflux disease), Hyperlipidemia, Hypersomnia, persistent (11/05/2017), Hypertension, Left shoulder pain (02/14/2016), Memory loss (01/30/2022), Narcolepsy, Osteopenia (04/2019), Premature atrial contractions, PVC's (premature ventricular contractions), Seasonal allergies, SVD  (spontaneous vaginal delivery), and Weight gain (01/19/2021). here with     ICD-10-CM   1. Narcolepsy and cataplexy  G47.411     2. Memory loss  R41.3     3. Excessive daytime sleepiness  G47.19     4. Poor compliance with CPAP treatment  Z91.199     5. Mild cognitive impairment with memory loss  G31.84     6. Attention and concentration deficit  R41.840         Kathryn Hendricks continues to have difficulty with CPAP compliance but is improving. AHI is 3 at current autopap setting. Compliance report reveals sub optimal compliance now at 67% daily and 60% 4 hour usage. She was encouraged to continue using CPAP nightly and for greater than 4 hours each night. We will update supply orders as indicated. Risks of untreated sleep apnea review and education materials provided. She will continue Vyvanse '70mg'$  daily. May consider talking with PCP if she wishes to have additional testing for ADD. We have discussed MMSE. Stable from 2023. She is completely oriented and able to perform recall tasks. Serial subtractions are difficult as well as following instructions. Formal neurocognitive testing discussed but she is not interested at this time as she recently had memory testing with DSS showing anxiety as most likely contributor to deficits. She has been approved for disability. She will monitor mood closely and follow up with PCP for any concerns of anxiety or depression. Healthy lifestyle habits encouraged. She will follow up with me in 6 months. She verbalizes understanding and agreement with this plan.    No orders of the defined types were placed in this encounter.    Meds ordered this encounter  Medications   lisdexamfetamine (VYVANSE) 70 MG capsule    Sig: Take 1 capsule (70 mg total) by mouth daily.    Dispense:  30 capsule    Refill:  0    Order Specific Question:   Supervising Provider    Answer:   Melvenia Beam [id number]     I spent 30 minutes of face-to-face and non-face-to-face  time with patient.  This included previsit chart review, lab review, study review, order entry, electronic health record documentation, patient education.   Debbora Presto, FNP-C 10/30/2022, 4:10 PM [facility] [address] [place], [place] [address] [phone]

## 2022-10-31 ENCOUNTER — Ambulatory Visit (INDEPENDENT_AMBULATORY_CARE_PROVIDER_SITE_OTHER): Payer: Medicare Other

## 2022-10-31 ENCOUNTER — Ambulatory Visit (INDEPENDENT_AMBULATORY_CARE_PROVIDER_SITE_OTHER): Payer: Medicare Other | Admitting: Orthopedic Surgery

## 2022-10-31 DIAGNOSIS — M545 Low back pain, unspecified: Secondary | ICD-10-CM

## 2022-11-03 ENCOUNTER — Encounter: Payer: Self-pay | Admitting: Orthopedic Surgery

## 2022-11-03 NOTE — Progress Notes (Signed)
Office Visit Note   Patient: Kathryn Hendricks           Date of Birth: Oct 09, 1955           MRN: [id number] Visit Date: 10/31/2022 Requested by: Kinnie Feil, MD [address] [place],  [place] [address] PCP: Kinnie Feil, MD  Subjective: Chief Complaint  Patient presents with   Shoulder Pain    HPI: Kathryn Hendricks is a 67 y.o. female who presents to the office reporting low back pain as well as some left shoulder pain.  She states she has dropped things in the morning.  Has occasional pain in the shoulder which is helped by Tylenol.  She also reports years of low back pain.  She really cannot stand to cook for very long.  Has had symptoms related to the back for many years with radiation into the posterior leg region.  No numbness and tingling.  Has tried home exercise program of stretching and strengthening but that has not helped.  Denies any fevers or chills or saddle paresthesias         ROS: All systems reviewed are negative as they relate to the chief complaint within the history of present illness.  Patient denies fevers or chills.  Assessment & Plan: Visit Diagnoses:  1. Low back pain, unspecified back pain laterality, unspecified chronicity, unspecified whether sciatica present     Plan: Impression is long history of low back pain with possible stenosis versus facet arthritis giving her symptoms.  Left shoulder doing reasonably well.  No indication for further workup on the shoulder.  MRI scan indicated for the lumbar spine to evaluate for stenosis versus facet arthritis.  Follow-up after that study.  Follow-Up Instructions: No follow-ups on file.   Orders:  Orders Placed This Encounter  Procedures   XR Lumbar Spine 2-3 Views   MR Lumbar Spine w/o contrast   No orders of the defined types were placed in this encounter.     Procedures: No procedures performed   Clinical Data: No additional findings.  Objective: Vital Signs: There were no  vitals taken for this visit.  Physical Exam:  Constitutional: Patient appears well-developed HEENT:  Head: Normocephalic Eyes:EOM are normal Neck: Normal range of motion Cardiovascular: Normal rate Pulmonary/chest: Effort normal Neurologic: Patient is alert Skin: Skin is warm Psychiatric: Patient has normal mood and affect  Ortho Exam: Ortho exam demonstrates mild pain with forward lateral bending.  No trochanteric tenderness is noted.  Patient has 5 out of 5 ankle dorsiflexion plantarflexion quad hamstring strength.  No groin pain with internal or external rotation of either hip.  Pedal pulses palpable.  No paresthesias L1 S1 bilaterally.  Pain is slightly worse with extension compared to flexion.  Specialty Comments:  No specialty comments available.  Imaging: No results found.   PMFS History: Patient Active Problem List   Diagnosis Date Noted   Cough 08/14/2022   Bilateral lower extremity edema 06/08/2022   Right knee pain 01/30/2022   Mood disturbance 01/30/2022   Sleep apnea 06/06/2021   Chronic left shoulder pain 03/08/2021   Excessive daytime sleepiness 03/08/2021   Attention and concentration deficit 03/08/2021   Overactive bladder 11/25/2020   Impingement syndrome of left shoulder region 02/10/2020   Osteopenia 11/12/2019   Paresthesia 06/30/2019   Narcolepsy and cataplexy 11/05/2017   S/P total hysterectomy 02/19/2017   PVC (premature ventricular contraction) 12/04/2016   Rotator cuff tear 07/10/2016   Biceps tendinitis on left 07/11/2015  Mild cognitive impairment with memory loss 11/09/2014   ADD (attention deficit hyperactivity disorder, inattentive type) 11/09/2014   HYPERCHOLESTEROLEMIA 07/13/2009   Overweight 07/13/2009   HYPERTENSION, BENIGN 09/25/2007   Past Medical History:  Diagnosis Date   Abdominal pain, left lower quadrant 11/08/2015   Abnormal Pap smear 2011   Anxiety    Arthritis    lower back and shoulders   Bronchitis    hx - used  abluterol inhaler   Chronic diastolic heart failure (HCC)    Cognitive decline    FIBROIDS, UTERUS 07/02/2009   Qualifier: Diagnosis of  By: Earley Favor MD, Cat     GERD (gastroesophageal reflux disease)    occasional   Hyperlipidemia    Hypersomnia, persistent 11/05/2017   Hypertension    Left shoulder pain 02/14/2016   Memory loss 01/30/2022   Narcolepsy    Osteopenia 04/2019   T score -1.7 FRAX 3.4% / 0.2%   Premature atrial contractions    PVC's (premature ventricular contractions)    Seasonal allergies    SVD (spontaneous vaginal delivery)    x 3   Weight gain 01/19/2021    Family History  Problem Relation Age of Onset   Hypertension Mother    Diabetes Mother    Stroke Mother    CAD Mother 58   Narcolepsy Mother        not diagnosed   Hypertension Sister    Cancer Sister        Colon cancer   Hypertension Brother    Cancer Brother        throat   Diabetes Father    Heart disease Father     Past Surgical History:  Procedure Laterality Date   ANTERIOR AND POSTERIOR REPAIR N/A 02/19/2017   Procedure: ANTERIOR (CYSTOCELE);  Surgeon: Bjorn Loser, MD;  Location: [facility];  Service: Urology;  Laterality: N/A;   APPENDECTOMY     COLONOSCOPY     CYSTOSCOPY N/A 02/19/2017   Procedure: CYSTOSCOPY;  Surgeon: Bjorn Loser, MD;  Location: [facility];  Service: Urology;  Laterality: N/A;   DILATION AND CURETTAGE OF UTERUS  2010   HYSTEROSCOPY W/ ENDOMETRIAL ABLATION  2010   SALPINGOOPHORECTOMY Bilateral 02/19/2017   Procedure: SALPINGO OOPHORECTOMY;  Surgeon: Anastasio Auerbach, MD;  Location: [facility];  Service: Gynecology;  Laterality: Bilateral;   TUBAL LIGATION     VAGINAL HYSTERECTOMY N/A 02/19/2017   Procedure: HYSTERECTOMY VAGINAL ,  BSO.;  Surgeon: Anastasio Auerbach, MD;  Location: [facility];  Service: Gynecology;  Laterality: N/A;  DO NOT OPEN LAP INSTRUMENTS   WISDOM TOOTH EXTRACTION     Social History   Occupational History   Occupation: Microbiologist: [employer]  Tobacco Use   Smoking status: Never    Passive exposure: Never   Smokeless tobacco: Never  Vaping Use   Vaping Use: Never used  Substance and Sexual Activity   Alcohol use: No    Alcohol/week: 0.0 standard drinks of alcohol   Drug use: No   Sexual activity: Never    Birth control/protection: Post-menopausal, Surgical    Comment: 1st intercourse 69 yo-5 partners

## 2022-11-05 DIAGNOSIS — Z0289 Encounter for other administrative examinations: Secondary | ICD-10-CM

## 2022-11-07 ENCOUNTER — Telehealth: Payer: Self-pay | Admitting: *Deleted

## 2022-11-07 NOTE — Telephone Encounter (Signed)
Not able to reach pt. We are not filling out patient form @ this time. Pt need to take this form to her PCP.

## 2022-11-12 ENCOUNTER — Other Ambulatory Visit (HOSPITAL_COMMUNITY): Payer: Self-pay

## 2022-11-13 ENCOUNTER — Encounter (HOSPITAL_BASED_OUTPATIENT_CLINIC_OR_DEPARTMENT_OTHER): Payer: Self-pay

## 2022-11-13 ENCOUNTER — Emergency Department (HOSPITAL_BASED_OUTPATIENT_CLINIC_OR_DEPARTMENT_OTHER): Payer: Medicare Other

## 2022-11-13 ENCOUNTER — Emergency Department (HOSPITAL_BASED_OUTPATIENT_CLINIC_OR_DEPARTMENT_OTHER)
Admission: EM | Admit: 2022-11-13 | Discharge: 2022-11-13 | Disposition: A | Discharge status: Home / Self Care | Payer: Medicare Other | Attending: Emergency Medicine | Admitting: Emergency Medicine

## 2022-11-13 ENCOUNTER — Telehealth: Payer: Self-pay | Admitting: Orthopedic Surgery

## 2022-11-13 ENCOUNTER — Other Ambulatory Visit: Payer: Self-pay

## 2022-11-13 ENCOUNTER — Emergency Department (HOSPITAL_BASED_OUTPATIENT_CLINIC_OR_DEPARTMENT_OTHER): Payer: Medicare Other | Admitting: Radiology

## 2022-11-13 DIAGNOSIS — R0602 Shortness of breath: Secondary | ICD-10-CM

## 2022-11-13 DIAGNOSIS — D72829 Elevated white blood cell count, unspecified: Secondary | ICD-10-CM | POA: Insufficient documentation

## 2022-11-13 DIAGNOSIS — R079 Chest pain, unspecified: Secondary | ICD-10-CM | POA: Diagnosis not present

## 2022-11-13 DIAGNOSIS — R072 Precordial pain: Secondary | ICD-10-CM | POA: Diagnosis not present

## 2022-11-13 DIAGNOSIS — R791 Abnormal coagulation profile: Secondary | ICD-10-CM | POA: Insufficient documentation

## 2022-11-13 DIAGNOSIS — R0789 Other chest pain: Secondary | ICD-10-CM | POA: Diagnosis not present

## 2022-11-13 LAB — BASIC METABOLIC PANEL
Anion gap: 6 (ref 5–15)
BUN: 12 mg/dL (ref 8–23)
CO2: 29 mmol/L (ref 22–32)
Calcium: 9.1 mg/dL (ref 8.9–10.3)
Chloride: 106 mmol/L (ref 98–111)
Creatinine, Ser: 0.87 mg/dL (ref 0.44–1.00)
GFR, Estimated: >60 mL/min (ref >60)
Glucose, Bld: 96 mg/dL (ref 70–99)
Potassium: 3.6 mmol/L (ref 3.5–5.1)
Sodium: 141 mmol/L (ref 135–145)

## 2022-11-13 LAB — CBC
HCT: 35.1 % — ABNORMAL LOW (ref 36.0–46.0)
Hemoglobin: 11 g/dL — ABNORMAL LOW (ref 12.0–15.0)
MCH: 27.7 pg (ref 26.0–34.0)
MCHC: 31.3 g/dL (ref 30.0–36.0)
MCV: 88.4 fL (ref 80.0–100.0)
Platelets: 270 10*3/uL (ref 150–400)
RBC: 3.97 MIL/uL (ref 3.87–5.11)
RDW: 15.2 % (ref 11.5–15.5)
WBC: 12.6 10*3/uL — ABNORMAL HIGH (ref 4.0–10.5)
nRBC: 0.2 % (ref 0.0–0.2)

## 2022-11-13 LAB — D-DIMER, QUANTITATIVE: D-Dimer, Quant: 0.58 ug/mL-FEU — ABNORMAL HIGH (ref 0.00–0.50)

## 2022-11-13 LAB — TROPONIN I (HIGH SENSITIVITY)
Troponin I (High Sensitivity): 5 ng/L (ref <18)
Troponin I (High Sensitivity): 6 ng/L (ref <18)

## 2022-11-13 LAB — BRAIN NATRIURETIC PEPTIDE: B Natriuretic Peptide: 144.6 pg/mL — ABNORMAL HIGH (ref 0.0–100.0)

## 2022-11-13 MED ORDER — IOHEXOL 350 MG/ML SOLN
100.0000 mL | Freq: Once | INTRAVENOUS | Status: AC | PRN
  Administered 2022-11-13: 75 mL via INTRAVENOUS

## 2022-11-13 NOTE — Discharge Instructions (Addendum)
You are seen in the emergency department for chest pain shortness of breath.  You had blood work EKG chest x-ray and a CAT scan of your chest that did not show an obvious explanation for your symptoms.  Please contact your cardiology team and your pulmonary team for follow-up.  Continue your regular medications.  Return to the emergency department if any worsening or concerning symptoms.

## 2022-11-13 NOTE — ED Triage Notes (Signed)
Patient here POV from Home.  Endorses Intermittent Throbbing CP to Central Chest mostly since last PM. Some SOB.  No N/V.   NAD Noted during Triage. A&Ox4. GCS 15. Ambulatory.

## 2022-11-13 NOTE — Telephone Encounter (Signed)
[place] forms received. Please advise work status. Thank you!

## 2022-11-13 NOTE — ED Provider Notes (Signed)
[facility] Provider Note   CSN: [id number] Arrival date & time: 11/13/22  1755     History {Add pertinent medical, surgical, social history, OB history to HPI:1} Chief Complaint  Patient presents with   Chest Pain    Kathryn Hendricks is a 67 y.o. female.  She is here with a complaint of some substernal chest pain that is been on and off since yesterday.  Last for an hour at a time.  Does not seem to be provoked by exertion.  She said she also feels short of breath with it.  She is taken Tylenol for it which seems to help.  She said she sees a cardiologist at [facility] and is also seen a pulmonologist.  Non-smoker.  The history is provided by the patient.  Chest Pain Pain location:  Substernal area Pain quality: aching   Pain radiates to:  L shoulder Pain severity:  Moderate Onset quality:  Gradual Duration:  24 hours Timing:  Intermittent Progression:  Unchanged Chronicity:  New Context: at rest   Worsened by:  Nothing Ineffective treatments: tylenol. Associated symptoms: shortness of breath   Associated symptoms: no cough, no diaphoresis, no dysphagia, no fever, no nausea and no vomiting   Risk factors: no coronary artery disease        Home Medications Prior to Admission medications   Medication Sig Start Date End Date Taking? Authorizing Provider  acetaminophen (TYLENOL) 500 MG tablet Take 500 mg by mouth 2 (two) times daily as needed.    [provider]  albuterol (VENTOLIN HFA) 108 (90 Base) MCG/ACT inhaler Inhale 2 puffs into the lungs every 6 (six) hours as needed for wheezing or shortness of breath. 08/14/22   Ganta, Anupa, DO  fluticasone (FLONASE) 50 MCG/ACT nasal spray Place 1-2 sprays into both nostrils daily. 10/19/21   Freddi Starr, MD  fluticasone (FLOVENT HFA) 110 MCG/ACT inhaler Inhale 2 puffs into the lungs 2 (two) times daily as needed. 08/14/22   Ganta, Anupa, DO  hydrochlorothiazide (HYDRODIURIL)  12.5 MG tablet Take 1 tablet (12.5 mg total) by mouth daily. 07/17/22   Kinnie Feil, MD  ipratropium (ATROVENT) 0.03 % nasal spray Place 2 sprays into both nostrils 4 (four) times daily as needed for rhinitis 01/24/22   Freddi Starr, MD  lisdexamfetamine (VYVANSE) 70 MG capsule Take 1 capsule (70 mg total) by mouth daily. 10/30/22   Lomax, Amy, NP  lisinopril (ZESTRIL) 30 MG tablet Take 1 tablet (30 mg total) by mouth at bedtime. 08/07/22   Martyn Malay, MD  montelukast (SINGULAIR) 10 MG tablet Take 1 tablet (10 mg total) by mouth at bedtime. 10/19/21   Freddi Starr, MD  rosuvastatin (CRESTOR) 10 MG tablet Take 1 tablet (10 mg total) by mouth daily. 08/07/22   Martyn Malay, MD  solifenacin (VESICARE) 5 MG tablet Take 1 tablet (5 mg total) by mouth daily. 10/16/22   Salvadore Dom, MD  amphetamine-dextroamphetamine (ADDERALL) 10 MG tablet Take 1 tablet (10 mg total) by mouth with water 2 (two) times daily before breakfast and before lunch. 03/08/21 03/08/21  Dohmeier, Asencion Partridge, MD      Allergies    Penicillins    Review of Systems   Review of Systems  Constitutional:  Negative for diaphoresis and fever.  HENT:  Negative for trouble swallowing.   Respiratory:  Positive for shortness of breath. Negative for cough.   Cardiovascular:  Positive for chest pain.  Gastrointestinal:  Negative for nausea and vomiting.    Physical Exam Updated Vital Signs BP (!) 145/75 (BP Location: Left Arm)   Pulse 61   Temp 98.4 F (36.9 C) (Oral)   Resp 18   Ht 5\' 3"  (1.6 m)   Wt 77.1 kg   SpO2 100%   BMI 30.11 kg/m  Physical Exam Vitals and nursing note reviewed.  Constitutional:      General: She is not in acute distress.    Appearance: She is well-developed.  HENT:     Head: Normocephalic and atraumatic.  Eyes:     Conjunctiva/sclera: Conjunctivae normal.  Cardiovascular:     Rate and Rhythm: Normal rate and regular rhythm.     Heart sounds: Normal heart sounds. No murmur  heard. Pulmonary:     Effort: Pulmonary effort is normal. No respiratory distress.     Breath sounds: Normal breath sounds.  Abdominal:     Palpations: Abdomen is soft.     Tenderness: There is no abdominal tenderness.  Musculoskeletal:        General: No swelling. Normal range of motion.     Cervical back: Neck supple.     Right lower leg: No tenderness.     Left lower leg: No tenderness.  Skin:    General: Skin is warm and dry.     Capillary Refill: Capillary refill takes less than 2 seconds.  Neurological:     General: No focal deficit present.     Mental Status: She is alert.     ED Results / Procedures / Treatments   Labs (all labs ordered are listed, but only abnormal results are displayed) Labs Reviewed  CBC - Abnormal; Notable for the following components:      Result Value   WBC 12.6 (*)    Hemoglobin 11.0 (*)    HCT 35.1 (*)    All other components within normal limits  BASIC METABOLIC PANEL  TROPONIN I (HIGH SENSITIVITY)  TROPONIN I (HIGH SENSITIVITY)    EKG EKG Interpretation  Date/Time:  Tuesday November 13 2022 18:06:05 EDT Ventricular Rate:  63 PR Interval:  148 QRS Duration: 96 QT Interval:  394 QTC Calculation: 403 R Axis:   36 Text Interpretation: Normal sinus rhythm Possible Left atrial enlargement Cannot rule out Anterior infarct , age undetermined Abnormal ECG When compared with ECG of 12-Oct-2006 01:21, Nonspecific T wave abnormality now evident in Anterior leads Confirmed by Aletta Edouard [phone]) on 11/13/2022 6:10:36 PM  Radiology DG Chest 2 View  Result Date: 11/13/2022 CLINICAL DATA:  Chest pain. EXAM: CHEST - 2 VIEW COMPARISON:  March 23, 2020 FINDINGS: The heart size and mediastinal contours are within normal limits. Mild, diffusely increased interstitial lung markings are noted. There is no evidence of focal consolidation, pleural effusion or pneumothorax. The visualized skeletal structures are unremarkable. IMPRESSION: Mildly increased  interstitial lung markings which is likely, in part, chronic in nature. A mild superimposed component of interstitial edema cannot be excluded. Electronically Signed   By: Virgina Norfolk M.D.   On: 11/13/2022 18:48    Procedures Procedures  {Document cardiac monitor, telemetry assessment procedure when appropriate:1}  Medications Ordered in ED Medications - No data to display  ED Course/ Medical Decision Making/ A&P   {   Click here for ABCD2, HEART and other calculatorsREFRESH Note before signing :1}                          Medical Decision  Making Amount and/or Complexity of Data Reviewed Labs: ordered. Radiology: ordered.   This patient complains of ***; this involves an extensive number of treatment Options and is a complaint that carries with it a high risk of complications and morbidity. The differential includes ***  I ordered, reviewed and interpreted labs, which included *** I ordered medication *** and reviewed PMP when indicated. I ordered imaging studies which included *** and I independently    visualized and interpreted imaging which showed *** Additional history obtained from *** Previous records obtained and reviewed *** I consulted *** and discussed lab and imaging findings and discussed disposition.  Cardiac monitoring reviewed, *** Social determinants considered, *** Critical Interventions: ***  After the interventions stated above, I reevaluated the patient and found *** Admission and further testing considered, ***   {Document critical care time when appropriate:1} {Document review of labs and clinical decision tools ie heart score, Chads2Vasc2 etc:1}  {Document your independent review of radiology images, and any outside records:1} {Document your discussion with family members, caretakers, and with consultants:1} {Document social determinants of health affecting pt's care:1} {Document your decision making why or why not admission, treatments were  needed:1} Final Clinical Impression(s) / ED Diagnoses Final diagnoses:  None    Rx / DC Orders ED Discharge Orders     None

## 2022-11-13 NOTE — ED Notes (Signed)
Report given to the next RN... 

## 2022-11-14 NOTE — Progress Notes (Unsigned)
Office Visit    Patient Name: [name] Date of Encounter: 11/14/2022  Primary Care Provider:  Kinnie Feil, MD Primary Cardiologist:  None Primary Electrophysiologist: None  Chief Complaint    Kathryn Hendricks is a 67 y.o. female with PMH of chronic diastolic CHF, HTN, GERD, narcolepsy, HLD, PVCs OSA (on CPAP) who presents today for post ED follow-up of chest pain.  Past Medical History    Past Medical History:  Diagnosis Date   Abdominal pain, left lower quadrant 11/08/2015   Abnormal Pap smear 2011   Anxiety    Arthritis    lower back and shoulders   Bronchitis    hx - used abluterol inhaler   Chronic diastolic heart failure (HCC)    Cognitive decline    FIBROIDS, UTERUS 07/02/2009   Qualifier: Diagnosis of  By: Ta MD, Cat     GERD (gastroesophageal reflux disease)    occasional   Hyperlipidemia    Hypersomnia, persistent 11/05/2017   Hypertension    Left shoulder pain 02/14/2016   Memory loss 01/30/2022   Narcolepsy    Osteopenia 04/2019   T score -1.7 FRAX 3.4% / 0.2%   Premature atrial contractions    PVC's (premature ventricular contractions)    Seasonal allergies    SVD (spontaneous vaginal delivery)    x 3   Weight gain 01/19/2021   Past Surgical History:  Procedure Laterality Date   ANTERIOR AND POSTERIOR REPAIR N/A 02/19/2017   Procedure: ANTERIOR (CYSTOCELE);  Surgeon: Bjorn Loser, MD;  Location: [facility];  Service: Urology;  Laterality: N/A;   APPENDECTOMY     COLONOSCOPY     CYSTOSCOPY N/A 02/19/2017   Procedure: CYSTOSCOPY;  Surgeon: Bjorn Loser, MD;  Location: [facility];  Service: Urology;  Laterality: N/A;   DILATION AND CURETTAGE OF UTERUS  2010   HYSTEROSCOPY W/ ENDOMETRIAL ABLATION  2010   SALPINGOOPHORECTOMY Bilateral 02/19/2017   Procedure: SALPINGO OOPHORECTOMY;  Surgeon: Anastasio Auerbach, MD;  Location: [facility];  Service: Gynecology;  Laterality: Bilateral;   TUBAL LIGATION     VAGINAL HYSTERECTOMY N/A 02/19/2017    Procedure: HYSTERECTOMY VAGINAL ,  BSO.;  Surgeon: Anastasio Auerbach, MD;  Location: [facility];  Service: Gynecology;  Laterality: N/A;  DO NOT OPEN LAP INSTRUMENTS   WISDOM TOOTH EXTRACTION      Allergies  Allergies  Allergen Reactions   Penicillins Shortness Of Breath, Itching and Swelling    Has patient had a PCN reaction causing immediate rash, facial/tongue/throat swelling, SOB or lightheadedness with hypotension: Yes Has patient had a PCN reaction causing severe rash involving mucus membranes or skin necrosis: No Has patient had a PCN reaction that required hospitalization: No Has patient had a PCN reaction occurring within the last 10 years: No If all of the above answers are "NO", then may proceed with Cephalosporin use.     History of Present Illness    Kathryn Hendricks  is a 67 year old female with the above mention past medical history who presents today for recent ED follow-up of chest pain.  Ms. has been followed by Dr. Angelena Form previously in 2015 when she was seen for referral of dyspnea and palpitations.  She had a previous echo completed in 2014 with normal LV size and function and enlarged left atrium.  She was given a 48-hour event monitor and underwent exercise treadmill stress test to rule out ischemia.  The event monitor showed sinus rhythm with rare PACs and PVCs  and patient was unable to reach target heart rate with treadmill stress test and was not available for Lexiscan stress test.  There was no further testing necessary at that time.  She underwent a stress test in 2017 prior to hysterectomy procedure that was normal and low risk.  She was seen last in 01/2020 by Truitt Merle, NP and had complaints of shortness of breath with lightheadedness.  She also noted palpitations and underwent 14-day ZIO monitor for further evaluation that showed no arrhythmias and predominant sinus rhythm.  She had a repeat 2D echo as well that revealed EF of 65 to 70% with no RWMA and pulmonary  hypertension with normal RV systolic function.  She was referred to pulmonary for further evaluation of elevated lung pressures. She was seen in the ED on 11/13/2022 with complaint of intermittent throbbing chest pain centrally with some shortness of breath.  She endorses the discomfort as being provoked by exertion.  She had a elevated D-dimer and CT angio showed no evidence of PE but shows pulmonary hypertension.  There was also indication of slightly elevated white count and low hemoglobin.  BNP was also noted to be slightly elevated and troponins were normal.  She was discharged in stable condition.    Since last being seen in the office patient reports***.  Patient denies chest pain, palpitations, dyspnea, PND, orthopnea, nausea, vomiting, dizziness, syncope, edema, weight gain, or early satiety.     ***Notes:  Home Medications    Current Outpatient Medications  Medication Sig Dispense Refill   acetaminophen (TYLENOL) 500 MG tablet Take 500 mg by mouth 2 (two) times daily as needed.     albuterol (VENTOLIN HFA) 108 (90 Base) MCG/ACT inhaler Inhale 2 puffs into the lungs every 6 (six) hours as needed for wheezing or shortness of breath. 18 g 1   fluticasone (FLONASE) 50 MCG/ACT nasal spray Place 1-2 sprays into both nostrils daily. 16 g 11   fluticasone (FLOVENT HFA) 110 MCG/ACT inhaler Inhale 2 puffs into the lungs 2 (two) times daily as needed. 12 g 12   hydrochlorothiazide (HYDRODIURIL) 12.5 MG tablet Take 1 tablet (12.5 mg total) by mouth daily. 30 tablet 3   ipratropium (ATROVENT) 0.03 % nasal spray Place 2 sprays into both nostrils 4 (four) times daily as needed for rhinitis 30 mL 3   lisdexamfetamine (VYVANSE) 70 MG capsule Take 1 capsule (70 mg total) by mouth daily. 30 capsule 0   lisinopril (ZESTRIL) 30 MG tablet Take 1 tablet (30 mg total) by mouth at bedtime. 90 tablet 1   montelukast (SINGULAIR) 10 MG tablet Take 1 tablet (10 mg total) by mouth at bedtime. 30 tablet 11    rosuvastatin (CRESTOR) 10 MG tablet Take 1 tablet (10 mg total) by mouth daily. 90 tablet 1   solifenacin (VESICARE) 5 MG tablet Take 1 tablet (5 mg total) by mouth daily. 30 tablet 1   No current facility-administered medications for this visit.     Review of Systems  Please see the history of present illness.    (+)*** (+)***  All other systems reviewed and are otherwise negative except as noted above.  Physical Exam    Wt Readings from Last 3 Encounters:  11/13/22 170 lb (77.1 kg)  10/30/22 177 lb 3.2 oz (80.4 kg)  08/14/22 170 lb (77.1 kg)   VE:LFYBO were no vitals filed for this visit.,There is no height or weight on file to calculate BMI.  Constitutional:      Appearance: Healthy  appearance. Not in distress.  Neck:     Vascular: JVD normal.  Pulmonary:     Effort: Pulmonary effort is normal.     Breath sounds: No wheezing. No rales. Diminished in the bases Cardiovascular:     Normal rate. Regular rhythm. Normal S1. Normal S2.      Murmurs: There is no murmur.  Edema:    Peripheral edema absent.  Abdominal:     Palpations: Abdomen is soft non tender. There is no hepatomegaly.  Skin:    General: Skin is warm and dry.  Neurological:     General: No focal deficit present.     Mental Status: Alert and oriented to person, place and time.     Cranial Nerves: Cranial nerves are intact.  EKG/LABS/ Recent Cardiac Studies    ECG personally reviewed by me today - ***  Risk Assessment/Calculations:   {Does this patient have ATRIAL FIBRILLATION?:[phone]}        Lab Results  Component Value Date   WBC 12.6 (H) 11/13/2022   HGB 11.0 (L) 11/13/2022   HCT 35.1 (L) 11/13/2022   MCV 88.4 11/13/2022   PLT 270 11/13/2022   Lab Results  Component Value Date   CREATININE 0.87 11/13/2022   BUN 12 11/13/2022   NA 141 11/13/2022   K 3.6 11/13/2022   CL 106 11/13/2022   CO2 29 11/13/2022   Lab Results  Component Value Date   ALT 19 02/23/2020   AST 20 02/23/2020    ALKPHOS 106 02/23/2020   BILITOT 0.3 02/23/2020   Lab Results  Component Value Date   CHOL 132 01/30/2022   HDL 44 01/30/2022   LDLCALC 71 01/30/2022   LDLDIRECT 136 (H) 08/09/2008   TRIG 87 01/30/2022   CHOLHDL 3.0 01/30/2022    Lab Results  Component Value Date   HGBA1C 5.6 06/18/2017    Cardiac Studies & Procedures     STRESS TESTS  MYOCARDIAL PERFUSION IMAGING 04/24/2016  Narrative  Nuclear stress EF: 69%.  There was no ST segment deviation noted during stress.  The study is normal.  This is a low risk study.  The left ventricular ejection fraction is hyperdynamic (>65%).   ECHOCARDIOGRAM  ECHOCARDIOGRAM COMPLETE 03/18/2020  Narrative ECHOCARDIOGRAM REPORT    Patient Name:   HANA ROCHFORD [id number] Date of Exam: 03/17/2020 Medical Rec #:  [id number]       Height:       64.0 in Accession #:    [id number]      Weight:       146.2 lb Date of Birth:  Apr 21, 1956       BSA:          1.712 m Patient Age:    13 years        BP:           140/86 mmHg Patient Gender: F               HR:           75 bpm. Exam Location:  [facility]  Procedure: 2D Echo, 3D Echo, Cardiac Doppler, Color Doppler and Strain Analysis  Indications:     I50.32 CHF  History:         Patient has prior history of Echocardiogram examinations, most recent 04/24/2016. Arrythmias:PVC; Risk Factors:Hypertension and Dyslipidemia.  Sonographer:     Jessee Avers, RDCS Referring Phys:  [place] C GERHARDT Diagnosing Phys: Fransico Him MD  IMPRESSIONS   1. Left ventricular  ejection fraction, by estimation, is 65 to 70%. The left ventricle has normal function. The left ventricle has no regional wall motion abnormalities. Left ventricular diastolic parameters were normal. The average left ventricular global longitudinal strain is -23.0 %. The global longitudinal strain is normal. 2. Right ventricular systolic function is normal. The right ventricular size is normal. There is moderately elevated  pulmonary artery systolic pressure. The estimated right ventricular systolic pressure is 99991111 mmHg. 3. The mitral valve is normal in structure. Trivial mitral valve regurgitation. No evidence of mitral stenosis. 4. The aortic valve is tricuspid. Aortic valve regurgitation is not visualized. Mild aortic valve sclerosis is present, with no evidence of aortic valve stenosis. 5. The inferior vena cava is normal in size with greater than 50% respiratory variability, suggesting right atrial pressure of 3 mmHg.  Comparison(s): 04/24/16 EF 55-60%.  FINDINGS Left Ventricle: Left ventricular ejection fraction, by estimation, is 65 to 70%. The left ventricle has normal function. The left ventricle has no regional wall motion abnormalities. The average left ventricular global longitudinal strain is -23.0 %. The global longitudinal strain is normal. The left ventricular internal cavity size was normal in size. There is no left ventricular hypertrophy. Left ventricular diastolic parameters were normal. Normal left ventricular filling pressure.  Right Ventricle: The right ventricular size is normal. No increase in right ventricular wall thickness. Right ventricular systolic function is normal. There is moderately elevated pulmonary artery systolic pressure. The tricuspid regurgitant velocity is 2.90 m/s, and with an assumed right atrial pressure of 8 mmHg, the estimated right ventricular systolic pressure is 99991111 mmHg.  Left Atrium: Left atrial size was normal in size.  Right Atrium: Right atrial size was normal in size.  Pericardium: There is no evidence of pericardial effusion.  Mitral Valve: The mitral valve is normal in structure. Normal mobility of the mitral valve leaflets. Trivial mitral valve regurgitation. No evidence of mitral valve stenosis.  Tricuspid Valve: The tricuspid valve is normal in structure. Tricuspid valve regurgitation is mild . No evidence of tricuspid stenosis.  Aortic Valve: The  aortic valve is tricuspid. Aortic valve regurgitation is not visualized. Mild aortic valve sclerosis is present, with no evidence of aortic valve stenosis.  Pulmonic Valve: The pulmonic valve was normal in structure. Pulmonic valve regurgitation is not visualized. No evidence of pulmonic stenosis.  Aorta: The aortic root is normal in size and structure.  Venous: The inferior vena cava is normal in size with greater than 50% respiratory variability, suggesting right atrial pressure of 3 mmHg.  IAS/Shunts: No atrial level shunt detected by color flow Doppler.   LEFT VENTRICLE PLAX 2D LVIDd:         4.00 cm  Diastology LVIDs:         2.70 cm  LV e' lateral:   9.30 cm/s LV PW:         0.70 cm  LV E/e' lateral: 11.0 LV IVS:        0.70 cm  LV e' medial:    8.10 cm/s LVOT diam:     1.90 cm  LV E/e' medial:  12.6 LV SV:         92 LV SV Index:   54       2D Longitudinal Strain LVOT Area:     2.84 cm 2D Strain GLS (A2C):   -22.6 % 2D Strain GLS (A3C):   -21.2 % 2D Strain GLS (A4C):   -25.1 % 2D Strain GLS Avg:     -23.0 %  3D Volume EF: 3D EF:        65 % LV EDV:       108 ml LV ESV:       38 ml LV SV:        70 ml  RIGHT VENTRICLE RV Basal diam:  4.00 cm RV S prime:     15.50 cm/s TAPSE (M-mode): 2.5 cm  LEFT ATRIUM             Index       RIGHT ATRIUM           Index LA diam:        3.60 cm 2.10 cm/m  RA Area:     12.90 cm LA Vol (A2C):   43.4 ml 25.34 ml/m RA Volume:   29.40 ml  17.17 ml/m LA Vol (A4C):   36.3 ml 21.20 ml/m LA Biplane Vol: 40.2 ml 23.48 ml/m AORTIC VALVE LVOT Vmax:   145.00 cm/s LVOT Vmean:  97.200 cm/s LVOT VTI:    0.325 m  AORTA Ao Root diam: 2.40 cm Ao Asc diam:  2.80 cm  MITRAL VALVE                TRICUSPID VALVE TR Peak grad:   33.6 mmHg TR Vmax:        290.00 cm/s MV E velocity: 102.00 cm/s MV A velocity: 85.30 cm/s   SHUNTS MV E/A ratio:  1.20         Systemic VTI:  0.32 m Systemic Diam: 1.90 cm  Fransico Him MD Electronically  signed by Fransico Him MD Signature Date/Time: 03/17/2020/12:06:24 PM    Final (Updated)    MONITORS  LONG TERM MONITOR (3-14 DAYS) 03/23/2020  Narrative Sinus rhythm No premature beats noted on strips available No atrial flutter or atrial fibrillation No AV block           Assessment & Plan    1.  Chest pain: -Patient was seen in the ED 2 days prior with complaint of central throbbing chest pain produced by exertion.  She underwent extensive ACS evaluation that was negative. -She had a previous Lexiscan Myoview completed in 2017 that was normal. -Today patient reports***  2.  Chronic diastolic CHF: -Most recent 2D echo completed 2021 with normal EF and pulmonary hypertension noted -Today patient is***  3.  Essential hypertension: -Patient's blood pressure today is***  4.  History of PVCs: -Patient had previous 14-day ZIO monitor completed that was normal showed controlled heart rate      Disposition: Follow-up with None or APP in *** months {Are you ordering a CV Procedure (e.g. stress test, cath, DCCV, TEE, etc)?   Press F2        :[id number]   Medication Adjustments/Labs and Tests Ordered: Current medicines are reviewed at length with the patient today.  Concerns regarding medicines are outlined above.   Signed, Mable Fill, Marissa Nestle, NP 11/14/2022, 1:00 PM [facility]  Note:  This document was prepared using Dragon voice recognition software and may include unintentional dictation errors.

## 2022-11-15 ENCOUNTER — Encounter: Payer: Self-pay | Admitting: Family Medicine

## 2022-11-15 ENCOUNTER — Other Ambulatory Visit (HOSPITAL_COMMUNITY): Payer: Self-pay

## 2022-11-15 ENCOUNTER — Encounter: Payer: Self-pay | Admitting: Nurse Practitioner

## 2022-11-15 ENCOUNTER — Ambulatory Visit: Payer: Medicare Other | Attending: Nurse Practitioner | Admitting: Nurse Practitioner

## 2022-11-15 VITALS — BP 134/80 | HR 84 | Ht 63.0 in | Wt 180.0 lb

## 2022-11-15 DIAGNOSIS — I493 Ventricular premature depolarization: Secondary | ICD-10-CM | POA: Diagnosis not present

## 2022-11-15 DIAGNOSIS — R079 Chest pain, unspecified: Secondary | ICD-10-CM | POA: Diagnosis not present

## 2022-11-15 DIAGNOSIS — I10 Essential (primary) hypertension: Secondary | ICD-10-CM | POA: Diagnosis not present

## 2022-11-15 DIAGNOSIS — R600 Localized edema: Secondary | ICD-10-CM | POA: Diagnosis not present

## 2022-11-15 DIAGNOSIS — I5032 Chronic diastolic (congestive) heart failure: Secondary | ICD-10-CM

## 2022-11-15 MED ORDER — FUROSEMIDE 20 MG PO TABS
20.0000 mg | ORAL_TABLET | Freq: Every day | ORAL | 1 refills | Status: DC | PRN
  Filled 2022-11-15 – 2022-12-20 (×2): qty 30, 30d supply, fill #0

## 2022-11-15 NOTE — Patient Instructions (Signed)
Medication Instructions:  START Lasix 20mg  Take 1 tablet as needed for shortness of breath or puffy ankles. *If you need a refill on your cardiac medications before your next appointment, please call your pharmacy*   Lab Work: None ordered   Testing/Procedures: Your physician has requested that you have an echocardiogram. Echocardiography is a painless test that uses sound waves to create images of your heart. It provides your doctor with information about the size and shape of your heart and how well your heart's chambers and valves are working. This procedure takes approximately one hour. There are no restrictions for this procedure. Please do NOT wear cologne, perfume, aftershave, or lotions (deodorant is allowed). Please arrive 15 minutes prior to your appointment time.  Your physician has requested that you have a lexiscan myoview. For further information please visit [email or web address]. Please follow instruction sheet, as given.    Follow-Up: At [facility], you and your health needs are our priority.  As part of our continuing mission to provide you with exceptional heart care, we have created designated Provider Care Teams.  These Care Teams include your primary Cardiologist (physician) and Advanced Practice Providers (APPs -  Physician Assistants and Nurse Practitioners) who all work together to provide you with the care you need, when you need it.  We recommend signing up for the patient portal called "MyChart".  Sign up information is provided on this After Visit Summary.  MyChart is used to connect with patients for Virtual Visits (Telemedicine).  Patients are able to view lab/test results, encounter notes, upcoming appointments, etc.  Non-urgent messages can be sent to your provider as well.   To learn more about what you can do with MyChart, go to [email or web address].    Your next appointment:   2 month(s)  Provider:   Ambrose Pancoast, NP      Other  Instructions You have been referred to PharmD to discuss [facility].

## 2022-11-15 NOTE — Progress Notes (Signed)
I completed her [employer] Disability Form.  Form placed in the fax box and a copy made to be scanned to her file.

## 2022-11-21 ENCOUNTER — Ambulatory Visit: Payer: Medicare Other | Admitting: Orthopedic Surgery

## 2022-11-22 NOTE — Telephone Encounter (Signed)
Her PCP filled this out as well it looks like. I'd hold off until we see what her MRI scan looks like from her last visit with Dr Marlou Sa

## 2022-11-30 ENCOUNTER — Other Ambulatory Visit (HOSPITAL_COMMUNITY): Payer: Self-pay

## 2022-12-01 ENCOUNTER — Ambulatory Visit
Admission: RE | Admit: 2022-12-01 | Discharge: 2022-12-01 | Disposition: A | Discharge status: Home / Self Care | Payer: Medicare Other | Source: Ambulatory Visit | Attending: Orthopedic Surgery | Admitting: Orthopedic Surgery

## 2022-12-01 DIAGNOSIS — M545 Low back pain, unspecified: Secondary | ICD-10-CM

## 2022-12-03 ENCOUNTER — Ambulatory Visit (INDEPENDENT_AMBULATORY_CARE_PROVIDER_SITE_OTHER): Payer: No Typology Code available for payment source | Admitting: Orthopedic Surgery

## 2022-12-03 DIAGNOSIS — M545 Low back pain, unspecified: Secondary | ICD-10-CM

## 2022-12-05 ENCOUNTER — Encounter: Payer: Self-pay | Admitting: Orthopedic Surgery

## 2022-12-05 NOTE — Progress Notes (Signed)
Office Visit Note   Patient: Kathryn Hendricks           Date of Birth: [birth year]           MRN: [id number] Visit Date: 12/03/2022 Requested by: Doreene Eland, MD [address] [place],  [place] [address] PCP: Doreene Eland, MD  Subjective: Chief Complaint  Patient presents with   Other     Scan review    HPI: Kathryn Hendricks is a 67 y.o. female who presents to the office reporting low back pain.  Since she was last seen she had an MRI scan.  Does hurt her to stand.  More right-sided versus left-sided.  MRI scan is reviewed with the patient and it does not show anything really operative.  Mild spondylosis.  No significant nerve compression.  Takes Tylenol for symptoms.                ROS: All systems reviewed are negative as they relate to the chief complaint within the history of present illness.  Patient denies fevers or chills.  Assessment & Plan: Visit Diagnoses: No diagnosis found.  Plan: Impression is low back pain with symptoms that the patient wants to live with.  I do not think an injection is necessarily indicated based on the lack of concrete identifiable pathology on the MRI scan.  I think stretching and walking for exercise would be helpful.  She will follow-up as needed.  Follow-Up Instructions: No follow-ups on file.   Orders:  No orders of the defined types were placed in this encounter.  No orders of the defined types were placed in this encounter.     Procedures: No procedures performed   Clinical Data: No additional findings.  Objective: Vital Signs: There were no vitals taken for this visit.  Physical Exam:  Constitutional: Patient appears well-developed HEENT:  Head: Normocephalic Eyes:EOM are normal Neck: Normal range of motion Cardiovascular: Normal rate Pulmonary/chest: Effort normal Neurologic: Patient is alert Skin: Skin is warm Psychiatric: Patient has normal mood and affect  Ortho Exam: Ortho exam unchanged.  Good  motor strength in the bilateral lower extremities with normal gait alignment and no muscle wasting or atrophy.  Some pain with forward lateral bending.  Specialty Comments:  No specialty comments available.  Imaging: No results found.   PMFS History: Patient Active Problem List   Diagnosis Date Noted   Cough 08/14/2022   Bilateral lower extremity edema 06/08/2022   Right knee pain 01/30/2022   Mood disturbance 01/30/2022   Sleep apnea 06/06/2021   Chronic left shoulder pain 03/08/2021   Excessive daytime sleepiness 03/08/2021   Attention and concentration deficit 03/08/2021   Overactive bladder 11/25/2020   Impingement syndrome of left shoulder region 02/10/2020   Osteopenia 11/12/2019   Paresthesia 06/30/2019   Narcolepsy and cataplexy 11/05/2017   S/P total hysterectomy 02/19/2017   PVC (premature ventricular contraction) 12/04/2016   Rotator cuff tear 07/10/2016   Biceps tendinitis on left 07/11/2015   Mild cognitive impairment with memory loss 11/09/2014   ADD (attention deficit hyperactivity disorder, inattentive type) 11/09/2014   HYPERCHOLESTEROLEMIA 07/13/2009   Overweight 07/13/2009   HYPERTENSION, BENIGN 09/25/2007   Past Medical History:  Diagnosis Date   Abdominal pain, left lower quadrant 11/08/2015   Abnormal Pap smear 2011   Anxiety    Arthritis    lower back and shoulders   Bronchitis    hx - used abluterol inhaler   Chronic diastolic heart failure  Cognitive decline    FIBROIDS, UTERUS 07/02/2009   Qualifier: Diagnosis of  By: Ta MD, Cat     GERD (gastroesophageal reflux disease)    occasional   Hyperlipidemia    Hypersomnia, persistent 11/05/2017   Hypertension    Left shoulder pain 02/14/2016   Memory loss 01/30/2022   Narcolepsy    Osteopenia 04/2019   T score -1.7 FRAX 3.4% / 0.2%   Premature atrial contractions    PVC's (premature ventricular contractions)    Seasonal allergies    SVD (spontaneous vaginal delivery)    x 3   Weight gain  01/19/2021    Family History  Problem Relation Age of Onset   Hypertension Mother    Diabetes Mother    Stroke Mother    CAD Mother [id number]   Narcolepsy Mother        not diagnosed   Hypertension Sister    Cancer Sister        Colon cancer   Hypertension Brother    Cancer Brother        throat   Diabetes Father    Heart disease Father     Past Surgical History:  Procedure Laterality Date   ANTERIOR AND POSTERIOR REPAIR N/A 02/19/2017   Procedure: ANTERIOR (CYSTOCELE);  Surgeon: Alfredo MartinezMacDiarmid, Scott, MD;  Location: [facility];  Service: Urology;  Laterality: N/A;   APPENDECTOMY     COLONOSCOPY     CYSTOSCOPY N/A 02/19/2017   Procedure: CYSTOSCOPY;  Surgeon: Alfredo MartinezMacDiarmid, Scott, MD;  Location: [facility];  Service: Urology;  Laterality: N/A;   DILATION AND CURETTAGE OF UTERUS  2010   HYSTEROSCOPY W/ ENDOMETRIAL ABLATION  2010   SALPINGOOPHORECTOMY Bilateral 02/19/2017   Procedure: SALPINGO OOPHORECTOMY;  Surgeon: Dara LordsFontaine, Timothy P, MD;  Location: [facility];  Service: Gynecology;  Laterality: Bilateral;   TUBAL LIGATION     VAGINAL HYSTERECTOMY N/A 02/19/2017   Procedure: HYSTERECTOMY VAGINAL ,  BSO.;  Surgeon: Dara LordsFontaine, Timothy P, MD;  Location: [facility];  Service: Gynecology;  Laterality: N/A;  DO NOT OPEN LAP INSTRUMENTS   WISDOM TOOTH EXTRACTION     Social History   Occupational History   Occupation: Equities traderHousekeeping    Employer: [employer]  Tobacco Use   Smoking status: Never    Passive exposure: Never   Smokeless tobacco: Never  Vaping Use   Vaping Use: Never used  Substance and Sexual Activity   Alcohol use: No    Alcohol/week: 0.0 standard drinks of alcohol   Drug use: No   Sexual activity: Never    Birth control/protection: Post-menopausal, Surgical    Comment: 1st intercourse 19 yo-5 partners

## 2022-12-12 ENCOUNTER — Telehealth (HOSPITAL_COMMUNITY): Payer: Self-pay | Admitting: *Deleted

## 2022-12-12 NOTE — Telephone Encounter (Signed)
Per DPR left detailed instructions for mpi study scheduled on 12/20/22.

## 2022-12-13 NOTE — Progress Notes (Signed)
67 y.o. [id number] Divorced Black or African American Not Hispanic or Latino female here for annual exam.   H/O TVH/BSO, A&P in 2018.    OAB, controlled on Vesicare  She has slight constipation from the Vesicare.   She has an area of tenderness on her right lateral chest wall.   Under lots of stress, son has been living with her and is struggling with depression  No LMP recorded. Patient has had a hysterectomy.          Sexually active: No.  The current method of family planning is status post hysterectomy.    Exercising: Yes.     [facility]  Smoker:  no  Health Maintenance: Pap:  11/12/19 WNL  History of abnormal Pap:  no MMG:  04/04/22 Bi-rads 1 neg  BMD:   01/01/22: T score -2.0, FRAX 3.7/0.3% 05/19/19 osteopenia,  T score -1.7 AP spine.  FRAX 3.4% / 0.2%. Colonoscopy: 06/10/20 f/u 5 years (FH colon cancer) TDaP:  06/14/17  Gardasil: n/a   reports that she has never smoked. She has never been exposed to tobacco smoke. She has never used smokeless tobacco. She reports that she does not drink alcohol and does not use drugs. 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 3 kids, 10 grandchildren. Everyone is local.   Past Medical History:  Diagnosis Date   Abdominal pain, left lower quadrant 11/08/2015   Abnormal Pap smear 2011   Anxiety    Arthritis    lower back and shoulders   Bronchitis    hx - used abluterol inhaler   Chronic diastolic heart failure    Cognitive decline    FIBROIDS, UTERUS 07/02/2009   Qualifier: Diagnosis of  By: Ta MD, Cat     GERD (gastroesophageal reflux disease)    occasional   Hyperlipidemia    Hypersomnia, persistent 11/05/2017   Hypertension    Left shoulder pain 02/14/2016   Memory loss 01/30/2022   Narcolepsy    Osteopenia 04/2019   T score -1.7 FRAX 3.4% / 0.2%   Premature atrial contractions    PVC's (premature ventricular contractions)    Seasonal allergies    SVD (spontaneous vaginal delivery)    x 3   Weight gain 01/19/2021    Past Surgical  History:  Procedure Laterality Date   ANTERIOR AND POSTERIOR REPAIR N/A 02/19/2017   Procedure: ANTERIOR (CYSTOCELE);  Surgeon: Alfredo Martinez, MD;  Location: [facility];  Service: Urology;  Laterality: N/A;   APPENDECTOMY     COLONOSCOPY     CYSTOSCOPY N/A 02/19/2017   Procedure: CYSTOSCOPY;  Surgeon: Alfredo Martinez, MD;  Location: [facility];  Service: Urology;  Laterality: N/A;   DILATION AND CURETTAGE OF UTERUS  2010   HYSTEROSCOPY W/ ENDOMETRIAL ABLATION  2010   SALPINGOOPHORECTOMY Bilateral 02/19/2017   Procedure: SALPINGO OOPHORECTOMY;  Surgeon: Dara Lords, MD;  Location: [facility];  Service: Gynecology;  Laterality: Bilateral;   TUBAL LIGATION     VAGINAL HYSTERECTOMY N/A 02/19/2017   Procedure: HYSTERECTOMY VAGINAL ,  BSO.;  Surgeon: Dara Lords, MD;  Location: [facility];  Service: Gynecology;  Laterality: N/A;  DO NOT OPEN LAP INSTRUMENTS   WISDOM TOOTH EXTRACTION      Current Outpatient Medications  Medication Sig Dispense Refill   acetaminophen (TYLENOL) 500 MG tablet Take 500 mg by mouth 2 (two) times daily as needed.     albuterol (VENTOLIN HFA) 108 (90 Base) MCG/ACT inhaler Inhale 2 puffs into the lungs every 6 (six)  hours as needed for wheezing or shortness of breath. 18 g 1   fluticasone (FLONASE) 50 MCG/ACT nasal spray Place 1-2 sprays into both nostrils daily. 16 g 11   fluticasone (FLOVENT HFA) 110 MCG/ACT inhaler Inhale 2 puffs into the lungs 2 (two) times daily as needed. 12 g 12   furosemide (LASIX) 20 MG tablet Take 1 tablet (20 mg total) by mouth daily as needed (shortness of breath or puffy ankles). 30 tablet 1   hydrochlorothiazide (HYDRODIURIL) 12.5 MG tablet Take 1 tablet (12.5 mg total) by mouth daily. 30 tablet 3   ipratropium (ATROVENT) 0.03 % nasal spray Place 2 sprays into both nostrils 4 (four) times daily as needed for rhinitis 30 mL 3   lisdexamfetamine (VYVANSE) 70 MG capsule Take 1 capsule (70 mg total) by mouth daily. 30 capsule 0   lisinopril  (ZESTRIL) 30 MG tablet Take 1 tablet (30 mg total) by mouth at bedtime. 90 tablet 1   montelukast (SINGULAIR) 10 MG tablet Take 1 tablet (10 mg total) by mouth at bedtime. 30 tablet 11   rosuvastatin (CRESTOR) 10 MG tablet Take 1 tablet (10 mg total) by mouth daily. 90 tablet 1   solifenacin (VESICARE) 5 MG tablet Take 1 tablet (5 mg total) by mouth daily. 30 tablet 1   No current facility-administered medications for this visit.    Family History  Problem Relation Age of Onset   Hypertension Mother    Diabetes Mother    Stroke Mother    CAD Mother 67   Narcolepsy Mother        not diagnosed   Hypertension Sister    Cancer Sister        Colon cancer   Hypertension Brother    Cancer Brother        throat   Diabetes Father    Heart disease Father     Review of Systems  All other systems reviewed and are negative.   Exam:   BP 122/74   Pulse 66   Ht  (1.6 m) Comment: patient reported due to hair.  Wt 172 lb (78 kg)   SpO2 99%   BMI 30.47 kg/m   Weight change: @ Height:   Height:  (160 cm) (patient reported due to hair.)  Ht Readings from Last 3 Encounters:  12/19/22  (1.6 m)  11/15/22  (1.6 m)  11/13/22  (1.6 m)    General appearance: alert, cooperative and appears stated age Head: Normocephalic, without obvious abnormality, atraumatic Neck: no adenopathy, supple, symmetrical, trachea midline and thyroid normal to inspection and palpation Breasts: normal appearance, no masses or tenderness. Chest wall lateral to her right breast is tender  Abdomen: soft, non-tender; non distended,  no masses,  no organomegaly Extremities: extremities normal, atraumatic, no cyanosis or edema Skin: Skin color, texture, turgor normal. No rashes or lesions Lymph nodes: Cervical, supraclavicular, and axillary nodes normal. No abnormal inguinal nodes palpated Neurologic: Grossly normal   Pelvic: External genitalia:  no lesions              Urethra:   normal appearing urethra with no masses, tenderness or lesions              Bartholins and Skenes: normal                 Vagina: atrophic appearing vagina with normal color and discharge, no lesions  Cervix: absent               Bimanual Exam:  Uterus:  uterus absent              Adnexa: no mass, fullness, tenderness               Rectovaginal: Confirms               Anus:  normal sphincter tone, no lesions  Carolynn Serve, CMA chaperoned for the exam.  1. Encounter for breast and pelvic examination Discussed breast self exam No pap needed  Mammogram due in 9/24 She will f/u with her primary for chest wall pain (can use ice/heat and ibuprofen)  2. Personal history of other medical treatment  3. Osteopenia, unspecified location DEXA due in 10/25 Discussed calcium and vit D intake Discussed exercise  4. OAB (overactive bladder) Controlled on medication - solifenacin (VESICARE) 5 MG tablet; Take 1 tablet (5 mg total) by mouth daily.  Dispense: 90 tablet; Refill: 3

## 2022-12-14 ENCOUNTER — Other Ambulatory Visit (HOSPITAL_COMMUNITY): Payer: Self-pay

## 2022-12-19 ENCOUNTER — Other Ambulatory Visit (HOSPITAL_COMMUNITY): Payer: Self-pay

## 2022-12-19 ENCOUNTER — Encounter: Payer: Self-pay | Admitting: Obstetrics and Gynecology

## 2022-12-19 ENCOUNTER — Ambulatory Visit (INDEPENDENT_AMBULATORY_CARE_PROVIDER_SITE_OTHER): Payer: Medicaid Other | Admitting: Obstetrics and Gynecology

## 2022-12-19 VITALS — BP 122/74 | HR 66 | Ht 63.0 in | Wt 172.0 lb

## 2022-12-19 DIAGNOSIS — Z9189 Other specified personal risk factors, not elsewhere classified: Secondary | ICD-10-CM

## 2022-12-19 DIAGNOSIS — Z01419 Encounter for gynecological examination (general) (routine) without abnormal findings: Secondary | ICD-10-CM

## 2022-12-19 DIAGNOSIS — Z9289 Personal history of other medical treatment: Secondary | ICD-10-CM

## 2022-12-19 DIAGNOSIS — M8580 Other specified disorders of bone density and structure, unspecified site: Secondary | ICD-10-CM | POA: Diagnosis not present

## 2022-12-19 DIAGNOSIS — N3281 Overactive bladder: Secondary | ICD-10-CM | POA: Diagnosis not present

## 2022-12-19 DIAGNOSIS — K219 Gastro-esophageal reflux disease without esophagitis: Secondary | ICD-10-CM | POA: Insufficient documentation

## 2022-12-19 DIAGNOSIS — Z800 Family history of malignant neoplasm of digestive organs: Secondary | ICD-10-CM | POA: Insufficient documentation

## 2022-12-19 MED ORDER — SOLIFENACIN SUCCINATE 5 MG PO TABS
5.0000 mg | ORAL_TABLET | Freq: Every day | ORAL | 3 refills | Status: DC
  Filled 2022-12-19: qty 90, 90d supply, fill #0
  Filled 2023-03-26: qty 90, 90d supply, fill #1
  Filled 2023-06-28: qty 90, 90d supply, fill #2
  Filled 2023-10-02: qty 90, 90d supply, fill #3

## 2022-12-19 NOTE — Patient Instructions (Signed)

## 2022-12-20 ENCOUNTER — Ambulatory Visit (HOSPITAL_BASED_OUTPATIENT_CLINIC_OR_DEPARTMENT_OTHER): Payer: No Typology Code available for payment source

## 2022-12-20 ENCOUNTER — Other Ambulatory Visit: Payer: Self-pay

## 2022-12-20 ENCOUNTER — Ambulatory Visit (HOSPITAL_COMMUNITY): Payer: No Typology Code available for payment source | Attending: Nurse Practitioner

## 2022-12-20 ENCOUNTER — Other Ambulatory Visit (HOSPITAL_COMMUNITY): Payer: Self-pay

## 2022-12-20 ENCOUNTER — Other Ambulatory Visit: Payer: Self-pay | Admitting: Family Medicine

## 2022-12-20 DIAGNOSIS — I493 Ventricular premature depolarization: Secondary | ICD-10-CM

## 2022-12-20 DIAGNOSIS — R079 Chest pain, unspecified: Secondary | ICD-10-CM | POA: Diagnosis not present

## 2022-12-20 DIAGNOSIS — I5030 Unspecified diastolic (congestive) heart failure: Secondary | ICD-10-CM

## 2022-12-20 DIAGNOSIS — I5032 Chronic diastolic (congestive) heart failure: Secondary | ICD-10-CM

## 2022-12-20 DIAGNOSIS — I082 Rheumatic disorders of both aortic and tricuspid valves: Secondary | ICD-10-CM

## 2022-12-20 DIAGNOSIS — I10 Essential (primary) hypertension: Secondary | ICD-10-CM | POA: Insufficient documentation

## 2022-12-20 LAB — MYOCARDIAL PERFUSION IMAGING
LV dias vol: 44 mL (ref 46–106)
LV sys vol: 10 mL
Nuc Stress EF: 78 %
Peak HR: 102 {beats}/min
Rest HR: 67 {beats}/min
Rest Nuclear Isotope Dose: 10 mCi
SDS: 1
SRS: 0
SSS: 1
ST Depression (mm): 0 mm
Stress Nuclear Isotope Dose: 31.8 mCi
TID: 0.93

## 2022-12-20 MED ORDER — REGADENOSON 0.4 MG/5ML IV SOLN
0.4000 mg | Freq: Once | INTRAVENOUS | Status: AC
  Administered 2022-12-20: 0.4 mg via INTRAVENOUS

## 2022-12-20 MED ORDER — TECHNETIUM TC 99M TETROFOSMIN IV KIT
31.8000 | PACK | Freq: Once | INTRAVENOUS | Status: AC | PRN
  Administered 2022-12-20: 31.8 via INTRAVENOUS

## 2022-12-20 MED ORDER — TECHNETIUM TC 99M TETROFOSMIN IV KIT
10.0000 | PACK | Freq: Once | INTRAVENOUS | Status: AC | PRN
  Administered 2022-12-20: 10 via INTRAVENOUS

## 2022-12-21 DIAGNOSIS — Z0100 Encounter for examination of eyes and vision without abnormal findings: Secondary | ICD-10-CM | POA: Diagnosis not present

## 2022-12-21 LAB — ECHOCARDIOGRAM COMPLETE
Area-P 1/2: 4.02 cm2
Height: 63 in
S' Lateral: 1.8 cm
Weight: 2880 oz

## 2022-12-24 ENCOUNTER — Ambulatory Visit: Payer: No Typology Code available for payment source

## 2022-12-24 NOTE — Progress Notes (Unsigned)
Patient ID: Kathryn Hendricks                 DOB: 06-17-1956                    MRN: [id number]     HPI: Kathryn Hendricks is a 67 y.o. female patient referred to pharmacy clinic by *** to initiate weight loss therapy with GLP1-RA. PMH is significant for obesity, HFpEF, HTN, HLD, and OSA (on CPAP). Most recent BMI 31.2.  Baseline/current weight and BMI: 180 lbs, 31.2 Current meds that affect weight: Vyvanse  Patient was recently in the ED on 11/13/2022 for intermittent throbbing chest pain with SOB. She had an elevated D-Dimer at the time. CT angio showed no evidence of PE but did show pulmonary HTN. Troponins were normal and she was discharged in stable condition.   At last visit with Robin Searing, NP on 11/15/2022, patient reported feeling much better though still experiencing occasional episodes of mild chest pain that resolve with Tylenol. BP at this visit was 134/80 mmHg and HR was 84 bpm. She reported struggling with drug addiction and stress. She also noted she had started exercising due to increased weight gain over the past few years. Patient states that DOE has worsened and it has become more difficult to resolve after rest. Patient was euvolemic on physical exam, but she noted that LEE had become more prevalent over last few months. Due to this, furosemide 20 mg daily PRN was started.   - Doesn't seem like her insurance covers Wegovy or Zepbound at this time  *** Follow-up visit  Assess % weight loss Assess adverse effects Missed doses  Diet:   Exercise:   Family History:   Social History:   Labs: Lab Results  Component Value Date   HGBA1C 5.6 06/18/2017    Wt Readings from Last 1 Encounters:  12/20/22 180 lb (81.6 kg)    BP Readings from Last 1 Encounters:  12/19/22 122/74   Pulse Readings from Last 1 Encounters:  12/19/22 66       Component Value Date/Time   CHOL 132 01/30/2022 1044   TRIG 87 01/30/2022 1044   HDL 44 01/30/2022 1044   CHOLHDL 3.0 01/30/2022  1044   CHOLHDL 3.4 06/05/2016 0903   VLDL 19 06/05/2016 0903   LDLCALC 71 01/30/2022 1044   LDLDIRECT 136 (H) 08/09/2008 2048    Past Medical History:  Diagnosis Date   Abdominal pain, left lower quadrant 11/08/2015   Abnormal Pap smear 2011   Anxiety    Arthritis    lower back and shoulders   Bronchitis    hx - used abluterol inhaler   Chronic diastolic heart failure (HCC)    Cognitive decline    FIBROIDS, UTERUS 07/02/2009   Qualifier: Diagnosis of  By: Ta MD, Cat     GERD (gastroesophageal reflux disease)    occasional   Hyperlipidemia    Hypersomnia, persistent 11/05/2017   Hypertension    Left shoulder pain 02/14/2016   Memory loss 01/30/2022   Narcolepsy    Osteopenia 04/2019   T score -1.7 FRAX 3.4% / 0.2%   Premature atrial contractions    PVC's (premature ventricular contractions)    Seasonal allergies    SVD (spontaneous vaginal delivery)    x 3   Weight gain 01/19/2021    Current Outpatient Medications on File Prior to Visit  Medication Sig Dispense Refill   acetaminophen (TYLENOL) 500 MG tablet Take 500 mg  by mouth 2 (two) times daily as needed.     albuterol (VENTOLIN HFA) 108 (90 Base) MCG/ACT inhaler Inhale 2 puffs into the lungs every 6 (six) hours as needed for wheezing or shortness of breath. 18 g 1   fluticasone (FLONASE) 50 MCG/ACT nasal spray Place 1-2 sprays into both nostrils daily. 16 g 11   fluticasone (FLOVENT HFA) 110 MCG/ACT inhaler Inhale 2 puffs into the lungs 2 (two) times daily as needed. 12 g 12   furosemide (LASIX) 20 MG tablet Take 1 tablet (20 mg total) by mouth daily as needed (shortness of breath or puffy ankles). 30 tablet 1   hydrochlorothiazide (HYDRODIURIL) 12.5 MG tablet Take 1 tablet (12.5 mg total) by mouth daily. 30 tablet 3   ipratropium (ATROVENT) 0.03 % nasal spray Place 2 sprays into both nostrils 4 (four) times daily as needed for rhinitis 30 mL 3   lisdexamfetamine (VYVANSE) 70 MG capsule Take 1 capsule (70 mg total) by mouth  daily. 30 capsule 0   lisinopril (ZESTRIL) 30 MG tablet Take 1 tablet (30 mg total) by mouth at bedtime. 90 tablet 1   montelukast (SINGULAIR) 10 MG tablet Take 1 tablet (10 mg total) by mouth at bedtime. 30 tablet 11   rosuvastatin (CRESTOR) 10 MG tablet Take 1 tablet (10 mg total) by mouth daily. 90 tablet 1   solifenacin (VESICARE) 5 MG tablet Take 1 tablet (5 mg total) by mouth daily. 90 tablet 3   No current facility-administered medications on file prior to visit.    Allergies  Allergen Reactions   Penicillins Itching, Shortness Of Breath, Swelling and Other (See Comments)    Has patient had a PCN reaction causing immediate rash, facial/tongue/throat swelling, SOB or lightheadedness with hypotension: Yes  Has patient had a PCN reaction causing severe rash involving mucus membranes or skin necrosis: No  Has patient had a PCN reaction that required hospitalization: No  Has patient had a PCN reaction occurring within the last 10 years: No  If all of the above answers are "NO", then may proceed with Cephalosporin use.   Penicillin G Other (See Comments)     Assessment/Plan:  1. Weight loss - Patient has not met goal of at least 5% of body weight loss with comprehensive lifestyle modifications alone in the past 3-6 months. Pharmacotherapy is appropriate to pursue as augmentation. Will start ***. Confirmed patient not ***pregnant and no personal or family history of medullary thyroid carcinoma (MTC) or Multiple Endocrine Neoplasia syndrome type 2 (MEN 2). Injection technique reviewed at today's visit.  Advised patient on common side effects including nausea, diarrhea, dyspepsia, decreased appetite, and fatigue. Counseled patient on reducing meal size and how to titrate medication to minimize side effects. Counseled patient to call if intolerable side effects or if experiencing dehydration, abdominal pain, or dizziness. Patient will adhere to dietary modifications and will target at least  150 minutes of moderate intensity exercise weekly.   Titration Plan:  Will plan to follow the titration plan as below, pending patient is tolerating each dose before increasing to the next. Can slow titration if needed for tolerability.    -Month 1: Inject *** SQ once weekly x 4 weeks -Month 2: Inject *** SQ once weekly x 4 weeks -Month 3: Inject *** SQ once weekly x 4 weeks -Month 4+: Inject *** SQ once weekly   Follow up in ***.

## 2022-12-31 ENCOUNTER — Telehealth: Payer: Self-pay | Admitting: Pharmacist

## 2022-12-31 ENCOUNTER — Ambulatory Visit: Payer: No Typology Code available for payment source

## 2022-12-31 NOTE — Telephone Encounter (Signed)
Patient referred to Pharm.D. clinic for weight loss medication by Alden Server.  Patient has both Medicare and Medicaid.  Unfortunately neither of these pay for weight loss medications.  Patient is not diabetic therefore would not be able to use GLP-1's on her diabetes indication.  Called patient and explained this, offered that she still could still come in to talk about lifestyle and exercise or we could cancel the appointment.  Patient wished to cancel appointment since we did not have any medication options for her.

## 2023-01-02 ENCOUNTER — Telehealth: Payer: Self-pay | Admitting: Nurse Practitioner

## 2023-01-02 NOTE — Telephone Encounter (Signed)
Kathryn Hendricks was contacted this afternoon to discuss questions related to her most recent 2D echo. She had all questions answered to her satisfaction and thanked me for the call today. I advised her that we will also discuss her results further at her follow-up on 01/15/2023.

## 2023-01-04 ENCOUNTER — Other Ambulatory Visit: Payer: Self-pay | Admitting: Family Medicine

## 2023-01-04 ENCOUNTER — Other Ambulatory Visit (HOSPITAL_COMMUNITY): Payer: Self-pay

## 2023-01-07 ENCOUNTER — Other Ambulatory Visit (HOSPITAL_COMMUNITY): Payer: Self-pay

## 2023-01-07 MED ORDER — LISDEXAMFETAMINE DIMESYLATE 70 MG PO CAPS
70.0000 mg | ORAL_CAPSULE | Freq: Every day | ORAL | 0 refills | Status: DC
  Filled 2023-01-07: qty 30, 30d supply, fill #0

## 2023-01-07 NOTE — Telephone Encounter (Signed)
Last seen on 10/30/22 per note " We will continue Vyvanse 70mg  daily. " Follow up scheduled on 05/07/23  Last filled on 11/14/22 #30 tablets (30 day supply) Rx pending to be signed

## 2023-01-11 ENCOUNTER — Other Ambulatory Visit (HOSPITAL_COMMUNITY): Payer: Self-pay

## 2023-01-11 ENCOUNTER — Other Ambulatory Visit: Payer: Self-pay | Admitting: Family Medicine

## 2023-01-11 MED ORDER — ROSUVASTATIN CALCIUM 10 MG PO TABS
10.0000 mg | ORAL_TABLET | Freq: Every day | ORAL | 1 refills | Status: DC
  Filled 2023-01-11: qty 90, 90d supply, fill #0

## 2023-01-12 ENCOUNTER — Other Ambulatory Visit (HOSPITAL_COMMUNITY): Payer: Self-pay

## 2023-01-14 ENCOUNTER — Other Ambulatory Visit: Payer: Self-pay

## 2023-01-14 NOTE — Progress Notes (Unsigned)
Office Visit    Patient Name: Kathryn Hendricks Date of Encounter: 01/14/2023  Primary Care Provider:  Doreene Eland, MD Primary Cardiologist:  None Primary Electrophysiologist: None   Past Medical History    Past Medical History:  Diagnosis Date   Abdominal pain, left lower quadrant 11/08/2015   Abnormal Pap smear 2011   Anxiety    Arthritis    lower back and shoulders   Bronchitis    hx - used abluterol inhaler   Chronic diastolic heart failure (HCC)    Cognitive decline    FIBROIDS, UTERUS 07/02/2009   Qualifier: Diagnosis of  By: Janalyn Harder MD, Cat     GERD (gastroesophageal reflux disease)    occasional   Hyperlipidemia    Hypersomnia, persistent 11/05/2017   Hypertension    Left shoulder pain 02/14/2016   Memory loss 01/30/2022   Narcolepsy    Osteopenia 04/2019   T score -1.7 FRAX 3.4% / 0.2%   Premature atrial contractions    PVC's (premature ventricular contractions)    Seasonal allergies    SVD (spontaneous vaginal delivery)    x 3   Weight gain 01/19/2021   Past Surgical History:  Procedure Laterality Date   ANTERIOR AND POSTERIOR REPAIR N/A 02/19/2017   Procedure: ANTERIOR (CYSTOCELE);  Surgeon: Alfredo Martinez, MD;  Location: [facility];  Service: Urology;  Laterality: N/A;   APPENDECTOMY     COLONOSCOPY     CYSTOSCOPY N/A 02/19/2017   Procedure: CYSTOSCOPY;  Surgeon: Alfredo Martinez, MD;  Location: [facility];  Service: Urology;  Laterality: N/A;   DILATION AND CURETTAGE OF UTERUS  2010   HYSTEROSCOPY W/ ENDOMETRIAL ABLATION  2010   SALPINGOOPHORECTOMY Bilateral 02/19/2017   Procedure: SALPINGO OOPHORECTOMY;  Surgeon: Dara Lords, MD;  Location: [facility];  Service: Gynecology;  Laterality: Bilateral;   TUBAL LIGATION     VAGINAL HYSTERECTOMY N/A 02/19/2017   Procedure: HYSTERECTOMY VAGINAL ,  BSO.;  Surgeon: Dara Lords, MD;  Location: [facility];  Service: Gynecology;  Laterality: N/A;  DO NOT OPEN LAP INSTRUMENTS   WISDOM TOOTH EXTRACTION       Allergies  Allergies  Allergen Reactions   Penicillins Itching, Shortness Of Breath, Swelling and Other (See Comments)    Has patient had a PCN reaction causing immediate rash, facial/tongue/throat swelling, SOB or lightheadedness with hypotension: Yes  Has patient had a PCN reaction causing severe rash involving mucus membranes or skin necrosis: No  Has patient had a PCN reaction that required hospitalization: No  Has patient had a PCN reaction occurring within the last 10 years: No  If all of the above answers are "NO", then may proceed with Cephalosporin use.   Penicillin G Other (See Comments)     History of Present Illness    Kathryn Hendricks is a 67 y.o. female with PMH of chronic diastolic CHF, HTN, GERD, narcolepsy, HLD, PVCs OSA (on CPAP) who presents today for 81-month follow-up.  [name] has been followed by Dr. Clifton James previously in 2015 when she was seen for referral of dyspnea and palpitations. She had a previous echo completed in 2014 with normal LV size and function and enlarged left atrium. She was given a 48-hour event monitor and underwent exercise treadmill stress test to rule out ischemia. The event monitor showed sinus rhythm with rare PACs and PVCs and patient was unable to reach target heart rate with treadmill stress test and was not available for Lexiscan stress test. There was no  further testing necessary at that time. She underwent a stress test in 2017 prior to hysterectomy procedure that was normal and low risk. She was seen in the ED on 11/13/2022 with complaint of intermittent throbbing chest pain centrally with some shortness of breath. She endorses the discomfort as being provoked by exertion. She had a elevated D-dimer and CT angio showed no evidence of PE but shows pulmonary hypertension. There was also indication of slightly elevated white count and low hemoglobin. BNP was also noted to be slightly elevated and troponins were normal. She was discharged  in stable condition.  She was seen in follow-up on 11/15/2022 and still endorsed chest pain.  She was sent for [facility] that was low risk and 2D echo was repeated that showed EF of 65 to 70% with grade 1 DD and normal RV systolic function, moderately dilated LA, mild calcification of aortic valve.  Since last being seen in the office patient reports***.  Patient denies chest pain, palpitations, dyspnea, PND, orthopnea, nausea, vomiting, dizziness, syncope, edema, weight gain, or early satiety.     ***Notes:  Home Medications    Current Outpatient Medications  Medication Sig Dispense Refill   acetaminophen (TYLENOL) 500 MG tablet Take 500 mg by mouth 2 (two) times daily as needed.     albuterol (VENTOLIN HFA) 108 (90 Base) MCG/ACT inhaler Inhale 2 puffs into the lungs every 6 (six) hours as needed for wheezing or shortness of breath. 18 g 1   fluticasone (FLONASE) 50 MCG/ACT nasal spray Place 1-2 sprays into both nostrils daily. 16 g 11   fluticasone (FLOVENT HFA) 110 MCG/ACT inhaler Inhale 2 puffs into the lungs 2 (two) times daily as needed. 12 g 12   furosemide (LASIX) 20 MG tablet Take 1 tablet (20 mg total) by mouth daily as needed (shortness of breath or puffy ankles). 30 tablet 1   hydrochlorothiazide (HYDRODIURIL) 12.5 MG tablet Take 1 tablet (12.5 mg total) by mouth daily. 30 tablet 3   ipratropium (ATROVENT) 0.03 % nasal spray Place 2 sprays into both nostrils 4 (four) times daily as needed for rhinitis 30 mL 3   lisdexamfetamine (VYVANSE) 70 MG capsule Take 1 capsule (70 mg total) by mouth daily. 30 capsule 0   lisinopril (ZESTRIL) 30 MG tablet Take 1 tablet (30 mg total) by mouth at bedtime. 90 tablet 1   montelukast (SINGULAIR) 10 MG tablet Take 1 tablet (10 mg total) by mouth at bedtime. 30 tablet 11   rosuvastatin (CRESTOR) 10 MG tablet Take 1 tablet (10 mg total) by mouth daily. 90 tablet 1   solifenacin (VESICARE) 5 MG tablet Take 1 tablet (5 mg total) by mouth daily.  90 tablet 3   No current facility-administered medications for this visit.     Review of Systems  Please see the history of present illness.    (+)*** (+)***  All other systems reviewed and are otherwise negative except as noted above.  Physical Exam    Wt Readings from Last 3 Encounters:  12/20/22 180 lb (81.6 kg)  12/19/22 172 lb (78 kg)  11/15/22 180 lb (81.6 kg)   BJ:YNWGN were no vitals filed for this visit.,There is no height or weight on file to calculate BMI.  Constitutional:      Appearance: Healthy appearance. Not in distress.  Neck:     Vascular: JVD normal.  Pulmonary:     Effort: Pulmonary effort is normal.     Breath sounds: No wheezing. No rales. Diminished in  the bases Cardiovascular:     Normal rate. Regular rhythm. Normal S1. Normal S2.      Murmurs: There is no murmur.  Edema:    Peripheral edema absent.  Abdominal:     Palpations: Abdomen is soft non tender. There is no hepatomegaly.  Skin:    General: Skin is warm and dry.  Neurological:     General: No focal deficit present.     Mental Status: Alert and oriented to person, place and time.     Cranial Nerves: Cranial nerves are intact.  EKG/LABS/ Recent Cardiac Studies    ECG personally reviewed by me today - ***  Cardiac Studies & Procedures     STRESS TESTS  MYOCARDIAL PERFUSION IMAGING 12/20/2022  Narrative   The study is normal. The study is low risk.   No ST deviation was noted.   Left ventricular function is normal. Nuclear stress EF: 78 %. The left ventricular ejection fraction is hyperdynamic (>65%). End diastolic cavity size is normal.   Prior study available for comparison from 04/24/2016.   ECHOCARDIOGRAM  ECHOCARDIOGRAM COMPLETE 12/21/2022  Narrative ECHOCARDIOGRAM REPORT    Patient Name:   MARTAVIA REISER ZOXWR Date of Exam: 12/20/2022 Medical Rec #:  [id number]       Height:       63.0 in Accession #:    [id number]      Weight:       180.0 lb Date of Birth:  May 24, 1956        BSA:          1.849 m Patient Age:    67 years        BP:           122/74 mmHg Patient Gender: F               HR:           72 bpm. Exam Location:  [address]  Procedure: 2D Echo, 3D Echo, Cardiac Doppler and Color Doppler  Indications:    R06.00 Dyspnea  History:        Patient has prior history of Echocardiogram examinations, most recent 03/17/2020. Arrythmias:PVC; Risk Factors:Hypertension and Dyslipidemia.  Sonographer:    Daphine Deutscher RDCS Referring Phys: [id number] Devoria Albe Theo Reither  IMPRESSIONS   1. Left ventricular ejection fraction, by estimation, is 65 to 70%. The left ventricle has normal function. The left ventricle has no regional wall motion abnormalities. Left ventricular diastolic parameters are consistent with Grade I diastolic dysfunction (impaired relaxation). 2. Right ventricular systolic function is normal. The right ventricular size is normal. There is normal pulmonary artery systolic pressure. The estimated right ventricular systolic pressure is 29.2 mmHg. 3. Left atrial size was mild to moderately dilated. 4. The mitral valve is normal in structure. No evidence of mitral valve regurgitation. No evidence of mitral stenosis. 5. The aortic valve is tricuspid. There is mild calcification of the aortic valve. Aortic valve regurgitation is not visualized. Aortic valve sclerosis/calcification is present, without any evidence of aortic stenosis. 6. The inferior vena cava is normal in size with greater than 50% respiratory variability, suggesting right atrial pressure of 3 mmHg.  FINDINGS Left Ventricle: Left ventricular ejection fraction, by estimation, is 65 to 70%. The left ventricle has normal function. The left ventricle has no regional wall motion abnormalities. The left ventricular internal cavity size was normal in size. There is no left ventricular hypertrophy. Left ventricular diastolic parameters are consistent with Grade I diastolic dysfunction  (  impaired relaxation).  Right Ventricle: The right ventricular size is normal. No increase in right ventricular wall thickness. Right ventricular systolic function is normal. There is normal pulmonary artery systolic pressure. The tricuspid regurgitant velocity is 2.46 m/s, and with an assumed right atrial pressure of 5 mmHg, the estimated right ventricular systolic pressure is 29.2 mmHg.  Left Atrium: Left atrial size was mild to moderately dilated.  Right Atrium: Right atrial size was normal in size.  Pericardium: There is no evidence of pericardial effusion.  Mitral Valve: The mitral valve is normal in structure. No evidence of mitral valve regurgitation. No evidence of mitral valve stenosis.  Tricuspid Valve: The tricuspid valve is normal in structure. Tricuspid valve regurgitation is mild . No evidence of tricuspid stenosis.  Aortic Valve: The aortic valve is tricuspid. There is mild calcification of the aortic valve. Aortic valve regurgitation is not visualized. Aortic valve sclerosis/calcification is present, without any evidence of aortic stenosis.  Pulmonic Valve: The pulmonic valve was normal in structure. Pulmonic valve regurgitation is trivial. No evidence of pulmonic stenosis.  Aorta: The aortic root is normal in size and structure.  Venous: The inferior vena cava is normal in size with greater than 50% respiratory variability, suggesting right atrial pressure of 3 mmHg.  IAS/Shunts: No atrial level shunt detected by color flow Doppler.   LEFT VENTRICLE PLAX 2D LVIDd:         3.40 cm   Diastology LVIDs:         1.80 cm   LV e' medial:    6.04 cm/s LV PW:         0.80 cm   LV E/e' medial:  12.3 LV IVS:        0.80 cm   LV e' lateral:   10.20 cm/s LVOT diam:     1.70 cm   LV E/e' lateral: 7.3 LV SV:         47 LV SV Index:   25 LVOT Area:     2.27 cm  3D Volume EF: 3D EF:        56 % LV EDV:       107 ml LV ESV:       48 ml LV SV:        60 ml  RIGHT VENTRICLE              IVC RV Basal diam:  2.90 cm     IVC diam: 1.60 cm RV S prime:     14.20 cm/s TAPSE (M-mode): 1.6 cm  LEFT ATRIUM             Index        RIGHT ATRIUM          Index LA diam:        4.20 cm 2.27 cm/m   RA Area:     8.02 cm LA Vol (A2C):   54.0 ml 29.21 ml/m  RA Volume:   16.50 ml 8.92 ml/m LA Vol (A4C):   45.8 ml 24.77 ml/m LA Biplane Vol: 49.7 ml 26.88 ml/m AORTIC VALVE LVOT Vmax:   87.40 cm/s LVOT Vmean:  61.300 cm/s LVOT VTI:    0.206 m  AORTA Ao Root diam: 2.90 cm Ao Asc diam:  3.00 cm  MITRAL VALVE               TRICUSPID VALVE MV Area (PHT): 4.02 cm    TR Peak grad:   24.2 mmHg MV Decel Time: 189  msec    TR Vmax:        246.00 cm/s MV E velocity: 74.40 cm/s MV A velocity: 84.10 cm/s  SHUNTS MV E/A ratio:  0.88        Systemic VTI:  0.21 m Systemic Diam: 1.70 cm  Arvilla Meres MD Electronically signed by Arvilla Meres MD Signature Date/Time: 12/21/2022/1:02:57 PM    Final    MONITORS  LONG TERM MONITOR (3-14 DAYS) 03/23/2020  Narrative Sinus rhythm No premature beats noted on strips available No atrial flutter or atrial fibrillation No AV block           Risk Assessment/Calculations:   {Does this patient have ATRIAL FIBRILLATION?[phone]}        Lab Results  Component Value Date   WBC 12.6 (H) 11/13/2022   HGB 11.0 (L) 11/13/2022   HCT 35.1 (L) 11/13/2022   MCV 88.4 11/13/2022   PLT 270 11/13/2022   Lab Results  Component Value Date   CREATININE 0.87 11/13/2022   BUN 12 11/13/2022   NA 141 11/13/2022   K 3.6 11/13/2022   CL 106 11/13/2022   CO2 29 11/13/2022   Lab Results  Component Value Date   ALT 19 02/23/2020   AST 20 02/23/2020   ALKPHOS 106 02/23/2020   BILITOT 0.3 02/23/2020   Lab Results  Component Value Date   CHOL 132 01/30/2022   HDL 44 01/30/2022   LDLCALC 71 01/30/2022   LDLDIRECT 136 (H) 08/09/2008   TRIG 87 01/30/2022   CHOLHDL 3.0 01/30/2022    Lab Results  Component Value Date    HGBA1C 5.6 06/18/2017     Assessment & Plan     1.  Chest pain: -Patient was seen in the ED 2 days prior with complaint of central throbbing chest pain produced by exertion.  She underwent extensive ACS evaluation that was negative. -She had a previous Lexiscan Myoview completed in 2017 that was normal. -Today patient reports that she is experienced occasional episodes of chest pain that are relieved with Tylenol. -We will have her complete a Lexiscan Myoview for further restratification. -She was advised and given ED return precautions if chest pain is returned and has changed in intensity in nature.   2.  Chronic diastolic CHF: -Most recent 2D echo completed 2021 with normal EF and pulmonary hypertension noted -Today patient is euvolemic on exam with trace lower extremity swelling.  She reports increased shortness of breath with activity that has been harder to resolved with rest. -We will repeat 2D echo to evaluate heart structure and pulmonary hypertension -We will add Lasix 20 mg as needed due to shortness of breath and lower extremity swelling -Patient has noticed significant weight gain and will be referred to Pharm.D. for consideration of Wegovy -Low sodium diet, fluid restriction <2L, and daily weights encouraged. Educated to contact our office for weight gain of 2 lbs overnight or 5 lbs in one week.    3.  Essential hypertension: -Patient's blood pressure today is controlled at 134/80 -Continue HCTZ 12.5 mg daily and lisinopril 30 mg daily   4.  History of PVCs: -Patient had previous 14-day ZIO monitor completed that was normal showed controlled heart rate -Today she reports no ongoing palpitations   5.  Lower extremity edema: -Patient has noticed bilateral lower extremity edema has been more prevalent over the past few months. -She will start Lasix 20 mg as needed and was advised to abstain from excess salt in her diet.  Disposition: Follow-up with None or APP in ***  months {Are you ordering a CV Procedure (e.g. stress test, cath, DCCV, TEE, etc)?   Press F2        :[id number]}   Medication Adjustments/Labs and Tests Ordered: Current medicines are reviewed at length with the patient today.  Concerns regarding medicines are outlined above.   Signed, Napoleon Form, Leodis Rains, NP 01/14/2023, 6:40 PM [facility]

## 2023-01-15 ENCOUNTER — Other Ambulatory Visit (HOSPITAL_COMMUNITY): Payer: Self-pay

## 2023-01-15 ENCOUNTER — Ambulatory Visit: Payer: No Typology Code available for payment source | Attending: Nurse Practitioner | Admitting: Nurse Practitioner

## 2023-01-15 ENCOUNTER — Encounter: Payer: Self-pay | Admitting: Nurse Practitioner

## 2023-01-15 ENCOUNTER — Telehealth: Payer: Self-pay | Admitting: *Deleted

## 2023-01-15 VITALS — BP 112/70 | HR 66 | Ht 64.0 in | Wt 169.6 lb

## 2023-01-15 DIAGNOSIS — I493 Ventricular premature depolarization: Secondary | ICD-10-CM | POA: Diagnosis not present

## 2023-01-15 DIAGNOSIS — R079 Chest pain, unspecified: Secondary | ICD-10-CM | POA: Diagnosis not present

## 2023-01-15 DIAGNOSIS — R2242 Localized swelling, mass and lump, left lower limb: Secondary | ICD-10-CM | POA: Diagnosis not present

## 2023-01-15 DIAGNOSIS — I10 Essential (primary) hypertension: Secondary | ICD-10-CM | POA: Diagnosis not present

## 2023-01-15 DIAGNOSIS — I5043 Acute on chronic combined systolic (congestive) and diastolic (congestive) heart failure: Secondary | ICD-10-CM

## 2023-01-15 MED ORDER — ROSUVASTATIN CALCIUM 10 MG PO TABS
10.0000 mg | ORAL_TABLET | Freq: Every day | ORAL | 2 refills | Status: DC
  Filled 2023-01-15: qty 90, 90d supply, fill #0
  Filled 2023-04-23 (×2): qty 90, 90d supply, fill #1
  Filled 2023-07-16: qty 90, 90d supply, fill #2

## 2023-01-15 NOTE — Patient Instructions (Addendum)
Medication Instructions:  Your physician recommends that you continue on your current medications as directed. Please refer to the Current Medication list given to you today. *If you need a refill on your cardiac medications before your next appointment, please call your pharmacy*   Lab Work: None Ordered If you have labs (blood work) drawn today and your tests are completely normal, you will receive your results only by: MyChart Message (if you have MyChart) OR A paper copy in the mail If you have any lab test that is abnormal or we need to change your treatment, we will call you to review the results.   Testing/Procedures: None ordered   Follow-Up: At [facility], you and your health needs are our priority.  As part of our continuing mission to provide you with exceptional heart care, we have created designated Provider Care Teams.  These Care Teams include your primary Cardiologist (physician) and Advanced Practice Providers (APPs -  Physician Assistants and Nurse Practitioners) who all work together to provide you with the care you need, when you need it.  We recommend signing up for the patient portal called "MyChart".  Sign up information is provided on this After Visit Summary.  MyChart is used to connect with patients for Virtual Visits (Telemedicine).  Patients are able to view lab/test results, encounter notes, upcoming appointments, etc.  Non-urgent messages can be sent to your provider as well.   To learn more about what you can do with MyChart, go to [email or web address].    Your next appointment:   12 month(s)  Provider:   Verne Carrow, MD   Other Instructions [employer]  Check your blood pressure daily for 2 weeks, then contact the office with your readings.

## 2023-01-15 NOTE — Telephone Encounter (Signed)
Faxed questions back for PA Vyvanse to [employer] at [phone]. Received fax confirmation. Waiting on determination.

## 2023-01-15 NOTE — Telephone Encounter (Signed)
Received fax from [facility] that PA approved 11/26/22-01/15/24.

## 2023-01-22 ENCOUNTER — Ambulatory Visit (INDEPENDENT_AMBULATORY_CARE_PROVIDER_SITE_OTHER): Payer: No Typology Code available for payment source | Admitting: Family Medicine

## 2023-01-22 ENCOUNTER — Other Ambulatory Visit (HOSPITAL_COMMUNITY): Payer: Self-pay

## 2023-01-22 ENCOUNTER — Encounter: Payer: Self-pay | Admitting: Family Medicine

## 2023-01-22 VITALS — BP 122/75 | HR 68 | Ht 64.0 in | Wt 166.8 lb

## 2023-01-22 DIAGNOSIS — R7309 Other abnormal glucose: Secondary | ICD-10-CM | POA: Diagnosis not present

## 2023-01-22 DIAGNOSIS — Z0000 Encounter for general adult medical examination without abnormal findings: Secondary | ICD-10-CM

## 2023-01-22 DIAGNOSIS — Z23 Encounter for immunization: Secondary | ICD-10-CM | POA: Diagnosis not present

## 2023-01-22 DIAGNOSIS — I5032 Chronic diastolic (congestive) heart failure: Secondary | ICD-10-CM

## 2023-01-22 MED ORDER — FUROSEMIDE 20 MG PO TABS
20.0000 mg | ORAL_TABLET | Freq: Every day | ORAL | 1 refills | Status: DC | PRN
  Filled 2023-01-22: qty 90, 90d supply, fill #0
  Filled 2023-08-02: qty 90, 90d supply, fill #1

## 2023-01-22 NOTE — Progress Notes (Signed)
Subjective:   Kathryn Hendricks is a 67 y.o. female who presents for an Initial Medicare Annual Wellness Visit.  CHF/B/L Ankle swelling:  Noticed ankle swelling two months ago. Was evaluated by Cardiologist who started her on Lasix prn. She uses Lasix daily. Need refills on her Lasix. Ankle swelling improved a little bit. No SOB or chest pain. She cut back on salt intake.  Review of Systems    As in the body of hx.       Objective:    Today's Vitals   01/22/23 0938 01/22/23 0949  BP: 122/75   Pulse: 68   SpO2: 100%   Weight: 166 lb 12.8 oz (75.7 kg)   Height: 5\' 4"  (1.626 m)   PainSc: 0-No pain 0-No pain   Body mass index is 28.63 kg/m. Physical Exam Vitals reviewed.  Cardiovascular:     Rate and Rhythm: Normal rate and regular rhythm.     Heart sounds: Normal heart sounds. No murmur heard. Pulmonary:     Effort: Pulmonary effort is normal. No respiratory distress.     Breath sounds: Normal breath sounds. No wheezing.  Abdominal:     General: Abdomen is flat. Bowel sounds are normal. There is no distension.     Palpations: There is no mass.  Musculoskeletal:     Cervical back: Normal range of motion.     Comments: Very trace B/L pedal swelling. No tenderness or erythema of her LL        01/22/2023    9:42 AM 11/13/2022    6:07 PM 08/14/2022    1:47 PM 07/17/2022    9:55 AM 04/13/2022    8:53 AM 03/16/2022    9:30 AM 01/30/2022    9:13 AM  Advanced Directives  Does Patient Have a Medical Advance Directive? No No No No No No No  Would patient like information on creating a medical advance directive?  No - Patient declined No - Patient declined No - Patient declined No - Patient declined No - Patient declined No - Patient declined    Current Medications (verified) Outpatient Encounter Medications as of 01/22/2023  Medication Sig   fluticasone (FLOVENT HFA) 110 MCG/ACT inhaler Inhale 2 puffs into the lungs 2 (two) times daily as needed.   lisdexamfetamine (VYVANSE)  70 MG capsule Take 1 capsule (70 mg total) by mouth daily.   lisinopril (ZESTRIL) 30 MG tablet Take 1 tablet (30 mg total) by mouth at bedtime.   montelukast (SINGULAIR) 10 MG tablet Take 1 tablet (10 mg total) by mouth at bedtime.   rosuvastatin (CRESTOR) 10 MG tablet Take 1 tablet (10 mg total) by mouth daily.   solifenacin (VESICARE) 5 MG tablet Take 1 tablet (5 mg total) by mouth daily.   [DISCONTINUED] furosemide (LASIX) 20 MG tablet Take 1 tablet (20 mg total) by mouth daily as needed (shortness of breath or puffy ankles).   acetaminophen (TYLENOL) 500 MG tablet Take 500 mg by mouth 2 (two) times daily as needed. (Patient not taking: Reported on 01/22/2023)   albuterol (VENTOLIN HFA) 108 (90 Base) MCG/ACT inhaler Inhale 2 puffs into the lungs every 6 (six) hours as needed for wheezing or shortness of breath. (Patient not taking: Reported on 01/22/2023)   fluticasone (FLONASE) 50 MCG/ACT nasal spray Place 1-2 sprays into both nostrils daily. (Patient not taking: Reported on 01/22/2023)   furosemide (LASIX) 20 MG tablet Take 1 tablet (20 mg total) by mouth daily as needed (shortness of breath or puffy ankles).  ipratropium (ATROVENT) 0.03 % nasal spray Place 2 sprays into both nostrils 4 (four) times daily as needed for rhinitis (Patient not taking: Reported on 01/22/2023)   No facility-administered encounter medications on file as of 01/22/2023.    Allergies (verified) Penicillins and Penicillin g   History: Past Medical History:  Diagnosis Date   Abdominal pain, left lower quadrant 11/08/2015   Abnormal Pap smear 2011   Anxiety    Arthritis    lower back and shoulders   Bronchitis    hx - used abluterol inhaler   Chronic diastolic heart failure (HCC)    Cognitive decline    FIBROIDS, UTERUS 07/02/2009   Qualifier: Diagnosis of  By: Janalyn Harder MD, Cat     GERD (gastroesophageal reflux disease)    occasional   Hyperlipidemia    Hypersomnia, persistent 11/05/2017   Hypertension    Left  shoulder pain 02/14/2016   Memory loss 01/30/2022   Narcolepsy    Osteopenia 04/2019   T score -1.7 FRAX 3.4% / 0.2%   Premature atrial contractions    PVC's (premature ventricular contractions)    Seasonal allergies    SVD (spontaneous vaginal delivery)    x 3   Weight gain 01/19/2021   Past Surgical History:  Procedure Laterality Date   ANTERIOR AND POSTERIOR REPAIR N/A 02/19/2017   Procedure: ANTERIOR (CYSTOCELE);  Surgeon: Alfredo Martinez, MD;  Location: [facility];  Service: Urology;  Laterality: N/A;   APPENDECTOMY     COLONOSCOPY     CYSTOSCOPY N/A 02/19/2017   Procedure: CYSTOSCOPY;  Surgeon: Alfredo Martinez, MD;  Location: [facility];  Service: Urology;  Laterality: N/A;   DILATION AND CURETTAGE OF UTERUS  2010   HYSTEROSCOPY W/ ENDOMETRIAL ABLATION  2010   SALPINGOOPHORECTOMY Bilateral 02/19/2017   Procedure: SALPINGO OOPHORECTOMY;  Surgeon: Dara Lords, MD;  Location: [facility];  Service: Gynecology;  Laterality: Bilateral;   TUBAL LIGATION     VAGINAL HYSTERECTOMY N/A 02/19/2017   Procedure: HYSTERECTOMY VAGINAL ,  BSO.;  Surgeon: Dara Lords, MD;  Location: [facility];  Service: Gynecology;  Laterality: N/A;  DO NOT OPEN LAP INSTRUMENTS   WISDOM TOOTH EXTRACTION     Family History  Problem Relation Age of Onset   Hypertension Mother    Diabetes Mother    Stroke Mother    CAD Mother 105   Narcolepsy Mother        not diagnosed   Hypertension Sister    Cancer Sister        Colon cancer   Hypertension Brother    Cancer Brother        throat   Diabetes Father    Heart disease Father    Social History   Socioeconomic History   Marital status: Divorced    Spouse name: Not on file   Number of children: 3   Years of education: 12   Highest education level: Not on file  Occupational History   Occupation: Equities trader: [employer]  Tobacco Use   Smoking status: Never    Passive exposure: Never   Smokeless tobacco: Never  Vaping Use   Vaping  Use: Never used  Substance and Sexual Activity   Alcohol use: No    Alcohol/week: 0.0 standard drinks of alcohol   Drug use: No   Sexual activity: Never    Birth control/protection: Post-menopausal, Surgical    Comment: 1st intercourse 36 yo-5 partners h/o STI  Other Topics Concern   Not on  file  Social History Narrative   Caffeine 1 cup coffee in am.   Social Determinants of Health   Financial Resource Strain: Not on file  Food Insecurity: No Food Insecurity (01/22/2023)   Hunger Vital Sign    Worried About Running Out of Food in the Last Year: Never true    Ran Out of Food in the Last Year: Never true  Transportation Needs: No Transportation Needs (01/22/2023)   PRAPARE - Administrator, Civil Service (Medical): No    Lack of Transportation (Non-Medical): No  Physical Activity: Not on file  Stress: Not on file  Social Connections: Moderately Integrated (01/22/2023)   Social Connection and Isolation Panel [NHANES]    Frequency of Communication with Friends and Family: More than three times a week    Frequency of Social Gatherings with Friends and Family: More than three times a week    Attends Religious Services: More than 4 times per year    Active Member of [employer] or Organizations: Yes    Attends Engineer, structural: More than 4 times per year    Marital Status: Divorced    Tobacco Counseling Counseling given: Not Answered   Clinical Intake:  Pre-visit preparation completed: Yes  Pain : No/denies pain Pain Score: 0-No pain     BMI - recorded: 28.6 Nutritional Status: BMI 25 -29 Overweight Diabetes: No  How often do you need to have someone help you when you read instructions, pamphlets, or other written materials from your doctor or pharmacy?: 1 - Never What is the last grade level you completed in school?: 12th grade  Diabetic?No  Interpreter Needed?: No      Activities of Daily Living    01/22/2023   10:04 AM  In your present  state of health, do you have any difficulty performing the following activities:  Hearing? 0  Vision? 0  Difficulty concentrating or making decisions? 1  Walking or climbing stairs? 1  Comment Difficult climbing the stairs with chronic knee pain  Doing errands, shopping? 0    Patient Care Team: Doreene Eland, MD as PCP - General (Family Medicine)  Indicate any recent Medical Services you may have received from other than [facility] providers in the past year (date may be approximate).     Assessment:   This is a routine wellness examination for [facility].  Hearing/Vision screen Hearing Screening   500Hz  2000Hz  4000Hz   Right ear Pass Pass Pass  Left ear Pass Pass Pass   Vision Screening   Right eye Left eye Both eyes  Without correction     With correction 20/20 20/20 20/20     Dietary issues and exercise activities discussed: Current Exercise Habits: Structured exercise class, Type of exercise: treadmill;walking, Time (Minutes): 50, Frequency (Times/Week): 3, Weekly Exercise (Minutes/Week): 150, Intensity: Moderate, Exercise limited by: None identified   Goals Addressed             This Visit's Progress    Weight (lb) < 200 lb (90.7 kg)   166 lb 12.8 oz (75.7 kg)    Working on exercising and healthy eating. I want to lose some weight. I go to the [employer] at least 3 times weekly.  I walk around the track for a mile, at least 10 laps. I get on treadmills 20 mins and then get on the bike 10 - 15 mins I need to lose 20 lbs in the next few months       Depression Screen  01/22/2023    9:59 AM 01/22/2023    9:57 AM 01/22/2023    9:39 AM 08/14/2022    1:51 PM 07/17/2022    9:54 AM 06/08/2022    3:45 PM 04/13/2022    8:54 AM  PHQ 2/9 Scores  PHQ - 2 Score 0 0  0  0 1  PHQ- 9 Score 6     5 11   Exception Documentation   Patient refusal  Patient refusal      Fall Risk    01/22/2023   10:04 AM 01/22/2023    9:39 AM 07/07/2020   11:03 AM 11/17/2019   10:10 AM 03/10/2019     9:14 AM  Fall Risk   Falls in the past year? 0 0 0 0 0  Number falls in past yr: 0 0 0 0   Injury with Fall? 0 0 0    Risk for fall due to : No Fall Risks      Follow up Falls evaluation completed   Falls evaluation completed     FALL RISK PREVENTION PERTAINING TO THE HOME:  Any stairs in or around the home? No  If so, are there any without handrails? No  Home free of loose throw rugs in walkways, pet beds, electrical cords, etc? Yes  Adequate lighting in your home to reduce risk of falls? Yes   ASSISTIVE DEVICES UTILIZED TO PREVENT FALLS:  Life alert? No  Use of a cane, walker or w/c? No  Grab bars in the bathroom? No  Shower chair or bench in shower? No  Elevated toilet seat or a handicapped toilet? No   TIMED UP AND GO:  Was the test performed? Yes .  Length of time to ambulate 10 feet: 7 sec.   Gait steady and fast without use of assistive device  Cognitive Function:    10/30/2022    2:40 PM 09/11/2021    1:14 PM 03/05/2017    8:36 AM 06/19/2016    8:28 AM 11/03/2015    8:18 AM  MMSE - Mini Mental State Exam  Orientation to time 5 5 4 4 5   Orientation to Place 5 5 5 5 5   Registration 3 3 3 3 3   Attention/ Calculation 0 0 0 2 4  Recall 2 2 1 2 2   Language- name 2 objects 2 2 2 2 2   Language- repeat 0 0 0 0 0  Language- follow 3 step command 3 2 3 3 2   Language- read & follow direction 1 1 1 1 1   Write a sentence 0 1 0 0 1  Copy design 0 0 1 0 0  Total score 21 21 20 22 25         Immunizations Immunization History  Administered Date(s) Administered   COVID-19, mRNA, vaccine(Comirnaty)12 years and older 06/25/2022   Influenza Whole 06/28/2007, 07/27/2008, 04/28/2016   Influenza,inj,Quad PF,6+ Mos 05/16/2015, 05/31/2020, 06/06/2021, 06/25/2022   Influenza,inj,quad, With Preservative 07/07/2019, 05/27/2020   Influenza-Unspecified 05/27/2013, 05/17/2014, 04/27/2016, 06/11/2019   Moderna Sars-Covid-2 Vaccination 09/22/2019, 10/09/2019   PFIZER  Comirnaty(Gray Top)Covid-19 Tri-Sucrose Vaccine 06/25/2022   PFIZER(Purple Top)SARS-COV-2 Vaccination 08/26/2019, 09/16/2019, 08/18/2020   PNEUMOCOCCAL CONJUGATE-20 01/22/2023   Pfizer Covid-19 Vaccine Bivalent Booster 52yrs & up 06/06/2021   Td 06/28/2007   Tdap 06/14/2017   Zoster Recombinat (Shingrix) 07/14/2019, 10/14/2019    TDAP status: Due, Education has been provided regarding the importance of this vaccine. Advised may receive this vaccine at local pharmacy or Health Dept. Aware to provide a copy  of the vaccination record if obtained from local pharmacy or Health Dept. Verbalized acceptance and understanding.  Flu Vaccine status: Up to date  Pneumococcal vaccine status: Completed during today's visit.  Covid-19 vaccine status: Completed vaccines  Qualifies for Shingles Vaccine? Yes   Zostavax completed No   Shingrix Completed?: Yes  Screening Tests Health Maintenance  Topic Date Due   Medicare Annual Wellness (AWV)  Never done   COVID-19 Vaccine (8 - 2023-24 season) 01/28/2024 (Originally 08/20/2022)   INFLUENZA VACCINE  03/28/2023   MAMMOGRAM  04/04/2024   PAP SMEAR-Modifier  11/11/2024   Colonoscopy  06/10/2025   DTaP/Tdap/Td (3 - Td or Tdap) 06/15/2027   Pneumonia Vaccine 72+ Years old  Completed   DEXA SCAN  Completed   Hepatitis C Screening  Completed   HIV Screening  Completed   Zoster Vaccines- Shingrix  Completed   HPV VACCINES  Aged Out    Health Maintenance  Health Maintenance Due  Topic Date Due   Medicare Annual Wellness (AWV)  Never done    Colorectal cancer screening: Type of screening: Colonoscopy. Completed 2021. Repeat every 10 years  Mammogram status: Completed 2023. Repeat every year  Bone Density status: Completed 2023. Results reflect: Bone density results: NORMAL. Repeat every 2 years.  Lung Cancer Screening: (Low Dose CT Chest recommended if Age 71-80 years, 30 pack-year currently smoking OR have quit w/in 15years.) does not qualify.    Lung Cancer Screening Referral: N/A  Additional Screening:  Hepatitis C Screening: does qualify; Completed 2016  Vision Screening: Recommended annual ophthalmology exams for early detection of glaucoma and other disorders of the eye. Is the patient up to date with their annual eye exam?  Yes  Who is the provider or what is the name of the office in which the patient attends annual eye exams? [facility] [facility] If pt is not established with a provider, would they like to be referred to a provider to establish care? No .   Dental Screening: Recommended annual dental exams for proper oral hygiene  Community Resource Referral / Chronic Care Management: CRR required this visit?  No   CCM required this visit?  No      Plan:     I have personally reviewed and noted the following in the patient's chart:   Medical and social history Use of alcohol, tobacco or illicit drugs  Current medications and supplements including opioid prescriptions. Patient is not currently taking opioid prescriptions. Functional ability and status Nutritional status Physical activity Advanced directives List of other physicians Hospitalizations, surgeries, and ER visits in previous 12 months Vitals Screenings to include cognitive, depression, and falls Referrals and appointments  In addition, I have reviewed and discussed with patient certain preventive protocols, quality metrics, and best practice recommendations. A written personalized care plan for preventive services as well as general preventive health recommendations were provided to patient.   Diastolic CHF/Ankle swelling: ECHO reviewed and was discussed with her. Elevated BNP reviewed. Plan to continue Lasix as ordered. Med refilled. Monitor BP and kidney functions closely. Consider beta-blocker in the future. She agreed with the plan.  Janit Pagan, MD   01/22/2023

## 2023-01-22 NOTE — Patient Instructions (Signed)

## 2023-01-23 ENCOUNTER — Telehealth: Payer: Self-pay | Admitting: Family Medicine

## 2023-01-23 LAB — HEMOGLOBIN A1C
Est. average glucose Bld gHb Est-mCnc: 137 mg/dL
Hgb A1c MFr Bld: 6.4 % — ABNORMAL HIGH (ref 4.8–5.6)

## 2023-01-23 NOTE — Telephone Encounter (Signed)
I discussed A1C report with her. She is prediabetic. Lifestyle modification discussed. She is already working on this. F/U in 3 months for repeat A1C. She agreed with the plan. Appointment made.

## 2023-02-01 ENCOUNTER — Telehealth: Payer: Self-pay | Admitting: Neurology

## 2023-02-01 NOTE — Telephone Encounter (Signed)
Huston Foley, MD  Swinson, Larkin Ina Dohmeier, Porfirio Mylar, MD; Shawnie Dapper, NP Pls put this in a phone note. Otherwise it will not be visible in the chart. I reviewed the chart and decline the transfer. I have nothing new or different to offer. I recommend, if there are any transcriptional errors in a note written by either Dr. Vickey Huger or Amy Lomax, that patient refer to that note specifically, and I am sure the note can be corrected or addended by the provider. SA       Previous Messages    ----- Message ----- From: Colen Darling Sent: 01/31/2023   2:43 PM EDT To: Melvyn Novas, MD; Huston Foley, MD Subject: Switiching providers                          Hello Dr. Vickey Huger,  Pt is wanting to transfer her care to Dr. Frances Furbish. 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 Pt stated "I'm not comfortable to see her anymore because that's all a lie that she told them". Pt stated "I'm 67 years old and have been retired for two years".  Please advise

## 2023-02-04 NOTE — Telephone Encounter (Addendum)
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 Not sure how or who told her insurance she was working.

## 2023-02-10 ENCOUNTER — Telehealth: Payer: Self-pay | Admitting: Neurology

## 2023-02-10 NOTE — Telephone Encounter (Signed)
I found a phone request by [name] to switch providers due to a false documentation in her medical record regarding her employment status.  This patient's clinical notes through [facility] clearly state that she has not been gainfully employed since 2022, there is no report that she is currently working full-time or part-time, in an office or elsewhere.  A progress note from 2022 by Amy Lomax stated the same, the patient had been 6 months out of work already by the time she was seen.  I do not understand how this misperception developed the patient can read through her own notes on my chart.  Melvyn Novas, MD

## 2023-02-20 ENCOUNTER — Other Ambulatory Visit (HOSPITAL_COMMUNITY): Payer: Self-pay

## 2023-02-20 ENCOUNTER — Other Ambulatory Visit: Payer: Self-pay

## 2023-02-20 ENCOUNTER — Other Ambulatory Visit: Payer: Self-pay | Admitting: Family Medicine

## 2023-02-20 ENCOUNTER — Telehealth: Payer: Self-pay | Admitting: Family Medicine

## 2023-02-20 MED ORDER — LISINOPRIL 30 MG PO TABS
30.0000 mg | ORAL_TABLET | Freq: Every day | ORAL | 1 refills | Status: DC
  Filled 2023-02-20: qty 90, 90d supply, fill #0
  Filled 2023-05-27: qty 90, 90d supply, fill #1

## 2023-02-20 MED ORDER — LISDEXAMFETAMINE DIMESYLATE 70 MG PO CAPS
70.0000 mg | ORAL_CAPSULE | Freq: Every day | ORAL | 0 refills | Status: DC
  Filled 2023-02-20: qty 30, 30d supply, fill #0

## 2023-02-20 NOTE — Telephone Encounter (Signed)
Pt last seen on 10/30/22 per note "She will continue Vyvanse 70mg  daily. " Follow up scheduled on 05/07/23 Last filled on 01/08/23 #30 tablets (30 day supply) Rx pending to be signed

## 2023-02-20 NOTE — Telephone Encounter (Signed)
Patient walked in and is requesting her blood pressure medication. She states that she is completely out and has not been feeling well and losing her balance. She does not want an appointment but is requesting to have the medication filled as soon as possible.Kathryn Hendricks   She would like for someone to call her when the refill has been sent.

## 2023-02-21 ENCOUNTER — Other Ambulatory Visit: Payer: Self-pay

## 2023-02-21 NOTE — Telephone Encounter (Signed)
Patient returns call to nurse line.   Patient advised medication to pharmacy.   She reports she will pick this up as soon as possible.

## 2023-03-04 ENCOUNTER — Ambulatory Visit (HOSPITAL_COMMUNITY)
Admission: EM | Admit: 2023-03-04 | Discharge: 2023-03-04 | Disposition: A | Discharge status: Home / Self Care | Payer: No Typology Code available for payment source | Attending: Internal Medicine | Admitting: Internal Medicine

## 2023-03-04 ENCOUNTER — Encounter (HOSPITAL_COMMUNITY): Payer: Self-pay

## 2023-03-04 DIAGNOSIS — N3001 Acute cystitis with hematuria: Secondary | ICD-10-CM | POA: Insufficient documentation

## 2023-03-04 LAB — POCT URINALYSIS DIP (MANUAL ENTRY)
Bilirubin, UA: NEGATIVE
Glucose, UA: NEGATIVE mg/dL
Ketones, POC UA: NEGATIVE mg/dL
Nitrite, UA: NEGATIVE
Protein Ur, POC: NEGATIVE mg/dL
Spec Grav, UA: 1.015 (ref 1.010–1.025)
Urobilinogen, UA: 1 E.U./dL
pH, UA: 6 (ref 5.0–8.0)

## 2023-03-04 MED ORDER — SULFAMETHOXAZOLE-TRIMETHOPRIM 800-160 MG PO TABS
1.0000 | ORAL_TABLET | Freq: Two times a day (BID) | ORAL | 0 refills | Status: AC
  Filled 2023-03-04: qty 6, 3d supply, fill #0

## 2023-03-04 NOTE — Discharge Instructions (Signed)
Please increase oral fluid intake Please take medications as prescribed We have sent your urine for cultures.  Will call you with recommendations if labs are abnormal Please return to urgent care if you have worsening symptoms.

## 2023-03-04 NOTE — ED Triage Notes (Signed)
Patient here today with c/o urinary frequency and urge to urinate, burning in urination, and odor X 3 days.

## 2023-03-05 ENCOUNTER — Other Ambulatory Visit: Payer: Self-pay

## 2023-03-05 ENCOUNTER — Other Ambulatory Visit (HOSPITAL_COMMUNITY): Payer: Self-pay

## 2023-03-06 ENCOUNTER — Encounter: Payer: Self-pay | Admitting: Family Medicine

## 2023-03-06 DIAGNOSIS — Z1231 Encounter for screening mammogram for malignant neoplasm of breast: Secondary | ICD-10-CM

## 2023-03-06 LAB — URINE CULTURE: Culture: 80000 — AB

## 2023-03-07 ENCOUNTER — Telehealth: Payer: Self-pay | Admitting: Family Medicine

## 2023-03-07 LAB — URINE CULTURE

## 2023-03-07 NOTE — ED Provider Notes (Signed)
[facility]    CSN: [id number] Arrival date & time: 03/04/23  1608      History   Chief Complaint Chief Complaint  Patient presents with   Urinary Frequency    HPI MARZETTA LANZA is a 67 y.o. female comes to the urgent care with a 3-day history of painful urination, frequency of urination and urinary urgency.  Patient denies any fever or chills.  No flank pain.  Patient's urine has a strong smell.  No nausea or vomiting.  No dizziness, near syncope or syncopal episodes. HPI  Past Medical History:  Diagnosis Date   Abdominal pain, left lower quadrant 11/08/2015   Abnormal Pap smear 2011   Anxiety    Arthritis    lower back and shoulders   Bronchitis    hx - used abluterol inhaler   Chronic diastolic heart failure (HCC)    Cognitive decline    FIBROIDS, UTERUS 07/02/2009   Qualifier: Diagnosis of  By: Janalyn Harder MD, Cat     GERD (gastroesophageal reflux disease)    occasional   Hyperlipidemia    Hypersomnia, persistent 11/05/2017   Hypertension    Left shoulder pain 02/14/2016   Memory loss 01/30/2022   Narcolepsy    Osteopenia 04/2019   T score -1.7 FRAX 3.4% / 0.2%   Premature atrial contractions    PVC's (premature ventricular contractions)    Seasonal allergies    SVD (spontaneous vaginal delivery)    x 3   Weight gain 01/19/2021    Patient Active Problem List   Diagnosis Date Noted   Family history of malignant neoplasm of digestive organs 12/19/2022   Gastroesophageal reflux disease 12/19/2022   Cough 08/14/2022   Bilateral lower extremity edema 06/08/2022   Right knee pain 01/30/2022   Mood disturbance 01/30/2022   Sleep apnea 06/06/2021   Chronic left shoulder pain 03/08/2021   Excessive daytime sleepiness 03/08/2021   Attention and concentration deficit 03/08/2021   Overactive bladder 11/25/2020   Impingement syndrome of left shoulder region 02/10/2020   Osteopenia 11/12/2019   Paresthesia 06/30/2019   Narcolepsy and cataplexy 11/05/2017   S/P  total hysterectomy 02/19/2017   PVC (premature ventricular contraction) 12/04/2016   Rotator cuff tear 07/10/2016   Biceps tendinitis on left 07/11/2015   Mild cognitive impairment with memory loss 11/09/2014   ADD (attention deficit hyperactivity disorder, inattentive type) 11/09/2014   Diastolic CHF (HCC) 06/30/2013   HYPERCHOLESTEROLEMIA 07/13/2009   Overweight 07/13/2009   HYPERTENSION, BENIGN 09/25/2007    Past Surgical History:  Procedure Laterality Date   ANTERIOR AND POSTERIOR REPAIR N/A 02/19/2017   Procedure: ANTERIOR (CYSTOCELE);  Surgeon: Alfredo Martinez, MD;  Location: [facility];  Service: Urology;  Laterality: N/A;   APPENDECTOMY     COLONOSCOPY     CYSTOSCOPY N/A 02/19/2017   Procedure: CYSTOSCOPY;  Surgeon: Alfredo Martinez, MD;  Location: [facility];  Service: Urology;  Laterality: N/A;   DILATION AND CURETTAGE OF UTERUS  2010   HYSTEROSCOPY W/ ENDOMETRIAL ABLATION  2010   SALPINGOOPHORECTOMY Bilateral 02/19/2017   Procedure: SALPINGO OOPHORECTOMY;  Surgeon: Dara Lords, MD;  Location: [facility];  Service: Gynecology;  Laterality: Bilateral;   TUBAL LIGATION     VAGINAL HYSTERECTOMY N/A 02/19/2017   Procedure: HYSTERECTOMY VAGINAL ,  BSO.;  Surgeon: Dara Lords, MD;  Location: [facility];  Service: Gynecology;  Laterality: N/A;  DO NOT OPEN LAP INSTRUMENTS   WISDOM TOOTH EXTRACTION      OB History  Gravida  5   Para  3   Term  2   Preterm  1   AB  2   Living  3      SAB      IAB  2   Ectopic      Multiple      Live Births               Home Medications    Prior to Admission medications   Medication Sig Start Date End Date Taking? Authorizing Provider  acetaminophen (TYLENOL) 500 MG tablet Take 500 mg by mouth 2 (two) times daily as needed.   Yes [provider]  albuterol (VENTOLIN HFA) 108 (90 Base) MCG/ACT inhaler Inhale 2 puffs into the lungs every 6 (six) hours as needed for wheezing or shortness of breath. 08/14/22   Yes Ganta, Anupa, DO  fluticasone (FLONASE) 50 MCG/ACT nasal spray Place 1-2 sprays into both nostrils daily. 10/19/21  Yes Martina Sinner, MD  furosemide (LASIX) 20 MG tablet Take 1 tablet (20 mg total) by mouth daily as needed (shortness of breath or puffy ankles). 01/22/23 04/22/23 Yes Janit Pagan T, MD  ipratropium (ATROVENT) 0.03 % nasal spray Place 2 sprays into both nostrils 4 (four) times daily as needed for rhinitis 01/24/22  Yes Dewald, Bettina Gavia, MD  lisdexamfetamine (VYVANSE) 70 MG capsule Take 1 capsule (70 mg total) by mouth daily. 02/20/23  Yes Lomax, Amy, NP  lisinopril (ZESTRIL) 30 MG tablet Take 1 tablet (30 mg total) by mouth at bedtime. 02/20/23  Yes Doreene Eland, MD  montelukast (SINGULAIR) 10 MG tablet Take 1 tablet (10 mg total) by mouth at bedtime. 10/19/21  Yes Martina Sinner, MD  rosuvastatin (CRESTOR) 10 MG tablet Take 1 tablet (10 mg total) by mouth daily. 01/15/23  Yes Gaston Islam., NP  solifenacin (VESICARE) 5 MG tablet Take 1 tablet (5 mg total) by mouth daily. 12/19/22  Yes Romualdo Bolk, MD  sulfamethoxazole-trimethoprim (BACTRIM DS) 800-160 MG tablet Take 1 tablet by mouth 2 (two) times daily for 3 days. 03/04/23 03/08/23 Yes Morena Mckissack, Britta Mccreedy, MD  fluticasone (FLOVENT HFA) 110 MCG/ACT inhaler Inhale 2 puffs into the lungs 2 (two) times daily as needed. 08/14/22   Reece Leader, DO    Family History Family History  Problem Relation Age of Onset   Hypertension Mother    Diabetes Mother    Stroke Mother    CAD Mother 71   Narcolepsy Mother        not diagnosed   Hypertension Sister    Cancer Sister        Colon cancer   Hypertension Brother    Cancer Brother        throat   Diabetes Father    Heart disease Father     Social History Social History   Tobacco Use   Smoking status: Never    Passive exposure: Never   Smokeless tobacco: Never  Vaping Use   Vaping status: Never Used  Substance Use Topics   Alcohol use: No     Alcohol/week: 0.0 standard drinks of alcohol   Drug use: No     Allergies   Penicillins and Penicillin g   Review of Systems Review of Systems As per HPI  Physical Exam Triage Vital Signs ED Triage Vitals [03/04/23 1724]  Encounter Vitals Group     BP (!) 151/78     Systolic BP Percentile      Diastolic  BP Percentile      Pulse Rate 64     Resp 16     Temp 99.4 F (37.4 C)     Temp Source Oral     SpO2 97 %     Weight 165 lb (74.8 kg)     Height 5\' 4"  (1.626 m)     Head Circumference      Peak Flow      Pain Score 6     Pain Loc      Pain Education      Exclude from Growth Chart    No data found.  Updated Vital Signs BP (!) 151/78 (BP Location: Left Arm)   Pulse 64   Temp 99.4 F (37.4 C) (Oral)   Resp 16   Ht 5\' 4"  (1.626 m)   Wt 74.8 kg   SpO2 97%   BMI 28.32 kg/m   Visual Acuity Right Eye Distance:   Left Eye Distance:   Bilateral Distance:    Right Eye Near:   Left Eye Near:    Bilateral Near:     Physical Exam Vitals and nursing note reviewed.  Constitutional:      General: She is not in acute distress.    Appearance: She is not ill-appearing.  Cardiovascular:     Rate and Rhythm: Normal rate and regular rhythm.  Pulmonary:     Effort: Pulmonary effort is normal.     Breath sounds: Normal breath sounds.  Abdominal:     General: Bowel sounds are normal.     Palpations: Abdomen is soft.  Neurological:     Mental Status: She is alert.      UC Treatments / Results  Labs (all labs ordered are listed, but only abnormal results are displayed) Labs Reviewed  URINE CULTURE - Abnormal; Notable for the following components:      Result Value   Culture 80,000 COLONIES/mL CITROBACTER KOSERI (*)    Organism ID, Bacteria CITROBACTER KOSERI (*)    All other components within normal limits  POCT URINALYSIS DIP (MANUAL ENTRY) - Abnormal; Notable for the following components:   Blood, UA small (*)    Leukocytes, UA Small (1+) (*)    All other  components within normal limits    EKG   Radiology No results found.  Procedures Procedures (including critical care time)  Medications Ordered in UC Medications - No data to display  Initial Impression / Assessment and Plan / UC Course  I have reviewed the triage vital signs and the nursing notes.  Pertinent labs & imaging results that were available during my care of the patient were reviewed by me and considered in my medical decision making (see chart for details).     1.  Acute cystitis with hematuria: Point-of-care urinalysis is positive for blood and leukocyte esterase Urine cultures have been sent Bactrim double strength 1 tablet twice daily for 3 days Patient is advised to increase oral fluid intake Return precautions given Will call patient with recommendations if labs are abnormal. Final Clinical Impressions(s) / UC Diagnoses   Final diagnoses:  Acute cystitis with hematuria     Discharge Instructions      Please increase oral fluid intake Please take medications as prescribed We have sent your urine for cultures.  Will call you with recommendations if labs are abnormal Please return to urgent care if you have worsening symptoms.   ED Prescriptions     Medication Sig Dispense Auth. Provider   sulfamethoxazole-trimethoprim (  BACTRIM DS) 800-160 MG tablet Take 1 tablet by mouth 2 (two) times daily for 3 days. 6 tablet Neelah Mannings, Britta Mccreedy, MD      PDMP not reviewed this encounter.   Merrilee Jansky, MD 03/07/23 1725

## 2023-03-07 NOTE — Telephone Encounter (Signed)
Patient called stating she is needing a Diagnotic Mammogram order. Due to having pain in side of breast mentioned at last appointment.   Please advise.   Thanks!

## 2023-03-08 NOTE — Telephone Encounter (Signed)
Contacted the patient to inform her that she would have to schedule a office visit to discuss the matter. Patient did not answer so I left a vm.

## 2023-03-15 ENCOUNTER — Ambulatory Visit (INDEPENDENT_AMBULATORY_CARE_PROVIDER_SITE_OTHER): Payer: No Typology Code available for payment source | Admitting: Family Medicine

## 2023-03-15 ENCOUNTER — Ambulatory Visit: Payer: No Typology Code available for payment source | Admitting: Family Medicine

## 2023-03-15 ENCOUNTER — Encounter: Payer: Self-pay | Admitting: Family Medicine

## 2023-03-15 VITALS — BP 138/75 | HR 58 | Ht 64.0 in | Wt 160.2 lb

## 2023-03-15 DIAGNOSIS — W19XXXA Unspecified fall, initial encounter: Secondary | ICD-10-CM

## 2023-03-15 DIAGNOSIS — R399 Unspecified symptoms and signs involving the genitourinary system: Secondary | ICD-10-CM

## 2023-03-15 DIAGNOSIS — N644 Mastodynia: Secondary | ICD-10-CM | POA: Diagnosis not present

## 2023-03-15 HISTORY — DX: Unspecified fall, initial encounter: W19.XXXA

## 2023-03-15 NOTE — Assessment & Plan Note (Signed)
Happened about 1 month ago due to missed balance Gait looks good today Monitor closely and consider PT referral in the future

## 2023-03-15 NOTE — Assessment & Plan Note (Signed)
Currently asymptomatic I reviewed her UC report Urine culture showed 80000 CFU of Citrobacter (not true UTI) However, she already completed A/B therapy and feels better today UA showed small blood and leukocytes Microscopy not done This might be related to her bacteriuria Keep well hydrated and repeat UA in 4 weeks recommended She agreed with the plan

## 2023-03-15 NOTE — Assessment & Plan Note (Signed)
Strong family hx of cancer Diagnostic mammogram ordered. She will call the breast center to schedule her appointment.

## 2023-03-15 NOTE — Patient Instructions (Signed)
Breast Tenderness Breast tenderness is a common problem for women of all ages, but may also occur in men. Breast tenderness has many possible causes, including hormone changes, infections, taking certain medicines, and caffeine intake. In women, the pain usually comes and goes with the menstrual cycle, but it can also be constant. Breast tenderness may range from mild discomfort to severe pain. You may have tests, such as a mammogram or an ultrasound, to check for any unusual findings. Having breast tenderness usually does not mean that you have breast cancer. Follow these instructions at home: Managing pain and discomfort  If directed, put ice on the painful area. To do this: Put ice in a plastic bag. Place a towel between your skin and the bag. Leave the ice on for 20 minutes, 2-3 times a day. If your skin turns bright red, remove the ice right away to prevent skin damage. The risk of skin damage is higher if you cannot feel pain, heat, or cold. Wear a supportive bra or chest support: During exercise. While sleeping, if your breasts are very tender. Medicines Take over-the-counter and prescription medicines only as told by your health care provider. If the cause of your pain is an infection, you may be prescribed an antibiotic medicine. If you were prescribed antibiotics, take them as told by your health care provider. Do not stop using the antibiotic even if you start to feel better. Eating and drinking Decrease the amount of caffeine in your diet. Instead, drink more water and choose caffeine-free drinks. Your health care provider may recommend that you lessen the amount of fat in your diet. You can do this by: Limiting fried foods. Cooking foods using methods such as baking, boiling, grilling, and broiling. General instructions  Keep a log of the days and times when your breasts are most tender. Ask your health care provider how to do breast exams at home. This will help you notice if  you have an unusual growth or lump. Keep all follow-up visits. Contact a health care provider if: Any part of your breast is hard, red, and hot to the touch. This may be a sign of infection. You are a woman and have a new or painful lump in your breast that remains after your menstrual period ends. You are not breastfeeding and you have fluid, especially blood or pus, coming out of your nipples. You have a fever. Your pain does not improve or it gets worse. Your pain is interfering with your daily activities. Summary Breast tenderness may range from mild discomfort to severe pain. Breast tenderness has many possible causes, including hormone changes, infections, taking certain medicines, and caffeine intake. It can be treated with ice, wearing a supportive bra or chest support, and medicines. Make changes to your diet as told by your health care provider. This information is not intended to replace advice given to you by your health care provider. Make sure you discuss any questions you have with your health care provider. Document Revised: 10/25/2021 Document Reviewed: 10/25/2021 Elsevier Patient Education  2024 [employer].

## 2023-03-15 NOTE — Progress Notes (Signed)
    SUBJECTIVE:   CHIEF COMPLAINT / HPI:   Breast soreness: She c/o right breast pain x 2-3 months. She denies breast swelling, discoloration or nipple discharge. No hx of trauma to her breast.  UC Follow up: She went to the UC recently for UTI symptoms for which she was treated with Bactrim. She was told there was blood in her urine with white cells. She wants to discuss her urine results.  Fall: She lost her balance about 1-2 months ago and fell on her left side. She had bruising of her left elbow and soreness of her left hip. All symptoms resolved. No new concerns today.  PERTINENT  PMH / PSH: PMHx reviewed  OBJECTIVE:   BP 138/75   Pulse (!) 58   Ht 5\' 4"  (1.626 m)   Wt 160 lb 4 oz (72.7 kg)   SpO2 100%   BMI 27.51 kg/m   Physical Exam Vitals and nursing note reviewed. Exam conducted with a chaperone present Clabe Seal Legette).  Cardiovascular:     Rate and Rhythm: Normal rate and regular rhythm.     Heart sounds: Normal heart sounds. No murmur heard. Pulmonary:     Effort: Pulmonary effort is normal. No respiratory distress.     Breath sounds: Normal breath sounds. No wheezing.  Chest:     Chest wall: No mass.  Breasts:    Right: Tenderness present. No swelling, inverted nipple, mass or skin change.     Left: Normal. No swelling, inverted nipple, mass or skin change.     Comments: Mild tenderness of the lateral border of her right breast Musculoskeletal:     Comments: Normal gait Healed scar of her left elbow      ASSESSMENT/PLAN:   Breast tenderness Strong family hx of cancer Diagnostic mammogram ordered. She will call the breast center to schedule her appointment.  UTI symptoms Currently asymptomatic I reviewed her UC report Urine culture showed 80000 CFU of Citrobacter (not true UTI) However, she already completed A/B therapy and feels better today UA showed small blood and leukocytes Microscopy not done This might be related to her  bacteriuria Keep well hydrated and repeat UA in 4 weeks recommended She agreed with the plan  Fall Happened about 1 month ago due to missed balance Gait looks good today Monitor closely and consider PT referral in the future     Janit Pagan, MD [facility]

## 2023-03-20 ENCOUNTER — Other Ambulatory Visit: Payer: Self-pay | Admitting: Family Medicine

## 2023-03-20 ENCOUNTER — Encounter: Payer: Self-pay | Admitting: Family Medicine

## 2023-03-20 DIAGNOSIS — N644 Mastodynia: Secondary | ICD-10-CM

## 2023-03-25 ENCOUNTER — Ambulatory Visit: Payer: No Typology Code available for payment source | Admitting: Pulmonary Disease

## 2023-03-26 ENCOUNTER — Other Ambulatory Visit (HOSPITAL_COMMUNITY): Payer: Self-pay

## 2023-03-27 ENCOUNTER — Other Ambulatory Visit (HOSPITAL_COMMUNITY): Payer: Self-pay

## 2023-04-08 ENCOUNTER — Ambulatory Visit: Payer: No Typology Code available for payment source | Admitting: Orthopedic Surgery

## 2023-04-08 ENCOUNTER — Ambulatory Visit: Payer: No Typology Code available for payment source

## 2023-04-08 ENCOUNTER — Ambulatory Visit
Admission: RE | Admit: 2023-04-08 | Discharge: 2023-04-08 | Disposition: A | Discharge status: Home / Self Care | Payer: No Typology Code available for payment source | Source: Ambulatory Visit | Attending: Family Medicine | Admitting: Family Medicine

## 2023-04-08 DIAGNOSIS — N644 Mastodynia: Secondary | ICD-10-CM

## 2023-04-10 ENCOUNTER — Other Ambulatory Visit: Payer: Self-pay | Admitting: Family Medicine

## 2023-04-10 ENCOUNTER — Other Ambulatory Visit (HOSPITAL_COMMUNITY): Payer: Self-pay

## 2023-04-11 ENCOUNTER — Other Ambulatory Visit (INDEPENDENT_AMBULATORY_CARE_PROVIDER_SITE_OTHER): Payer: No Typology Code available for payment source

## 2023-04-11 ENCOUNTER — Other Ambulatory Visit (HOSPITAL_COMMUNITY): Payer: Self-pay

## 2023-04-11 ENCOUNTER — Ambulatory Visit (INDEPENDENT_AMBULATORY_CARE_PROVIDER_SITE_OTHER): Payer: No Typology Code available for payment source | Admitting: Surgical

## 2023-04-11 DIAGNOSIS — M79602 Pain in left arm: Secondary | ICD-10-CM

## 2023-04-11 MED ORDER — LISDEXAMFETAMINE DIMESYLATE 70 MG PO CAPS
70.0000 mg | ORAL_CAPSULE | Freq: Every day | ORAL | 0 refills | Status: DC
  Filled 2023-04-11: qty 30, 30d supply, fill #0

## 2023-04-11 NOTE — Telephone Encounter (Signed)
Dr.Athar you are work in provider, Amy is out of the office Last seen on 10/30/22 Follow up scheduled on 05/07/23 Last filled on 02/20/23 #30 tablets (30 day supply) Rx pending to be signed

## 2023-04-12 ENCOUNTER — Encounter: Payer: Self-pay | Admitting: Orthopedic Surgery

## 2023-04-12 ENCOUNTER — Other Ambulatory Visit (HOSPITAL_COMMUNITY): Payer: Self-pay

## 2023-04-12 NOTE — Progress Notes (Signed)
Office Visit Note   Patient: Kathryn Hendricks           Date of Birth: 1956-05-10           MRN: [id number] Visit Date: 04/11/2023 Requested by: Doreene Eland, MD [address] [place],  [place] [address] PCP: Doreene Eland, MD  Subjective: Chief Complaint  Patient presents with   Left Shoulder - Pain    HPI: Kathryn Hendricks is a 67 y.o. female who presents to the office reporting bilateral shoulder and arm pain left greater than right.  Main complaint is the left shoulder pain and difficulty functioning that she has.  She also describes neck pain and stiffness with radicular pain down the left arm in particular.  She has radicular pain that travels from the shoulder down into her fingers with numbness and tingling on the palmar aspect of the left middle finger, index finger, ring finger.  Also has burning sensation in the posterior lateral aspect of the left shoulder and the left scapular region.  She has difficulty doing tasks overhead due to shoulder weakness.  She has history of prior left shoulder surgery by Dr. Rennis Chris several years ago for rotator cuff tendinopathy.  Pain will wake her up at night.  She is currently on disability both for difficulty using the shoulder overhead and for her history of narcolepsy which limits her ability to do sitdown work.  Takes Tylenol.  She is left-hand dominant..                ROS: All systems reviewed are negative as they relate to the chief complaint within the history of present illness.  Patient denies fevers or chills.  Assessment & Plan: Visit Diagnoses:  1. Left arm pain     Plan: Patient is a 67 year old female who presents for evaluation of primarily left shoulder pain.  She has history of rotator cuff repair by Dr. 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  Also has history of narcolepsy which limits her ability to do  sitdown work as she will fall asleep if she is not moving basically.  She is concerned about the shoulder pain but she actually has fairly well-preserved rotator cuff strength on exam today.  Also has some radicular component to her pain which is likely stemming from the cervical spine.  Offered to order MRI of the cervical spine for further evaluation but she would like to hold off on this for now.  Would defer to the operating physician for her work status which seems to be no heavy lifting or overhead work as of now.  Follow-up as needed.  Follow-Up Instructions: Return if symptoms worsen or fail to improve.   Orders:  Orders Placed This Encounter  Procedures   XR Shoulder Left   XR Cervical Spine 2 or 3 views   No orders of the defined types were placed in this encounter.     Procedures: No procedures performed   Clinical Data: No additional findings.  Objective: Vital Signs: There were no vitals taken for this visit.  Physical Exam:  Constitutional: Patient appears well-developed HEENT:  Head: Normocephalic Eyes:EOM are normal Neck: Normal range of motion Cardiovascular: Normal rate Pulmonary/chest: Effort normal Neurologic: Patient is alert Skin: Skin is warm Psychiatric: Patient has normal mood and affect  Ortho Exam: Ortho exam demonstrates left shoulder with 45 degrees X  rotation, 80 degrees abduction, 125 degrees forward elevation both passively and actively.  This compared with the right shoulder with 65 degrees X rotation, 95 degrees abduction, 140 degrees forward elevation passively and actively.  She has 5 -/5 infraspinatus and supraspinatus strength with 5/5 subscapularis strength.  Negative Spurling sign.  Negative Lhermitte sign.  No significant tenderness over the [facility] joint today.  Does have some tenderness over the bicipital groove.  Intact EPL, FPL, finger abduction, pronation/supination, bicep, tricep, deltoid.  Axillary nerve intact with deltoid firing.   Incisions well-healed from prior left shoulder surgery.  Specialty Comments:  No specialty comments available.  Imaging: No results found.   PMFS History: Patient Active Problem List   Diagnosis Date Noted   Breast tenderness 03/15/2023   UTI symptoms 03/15/2023   Fall 03/15/2023   Family history of malignant neoplasm of digestive organs 12/19/2022   Gastroesophageal reflux disease 12/19/2022   Cough 08/14/2022   Bilateral lower extremity edema 06/08/2022   Right knee pain 01/30/2022   Mood disturbance 01/30/2022   Sleep apnea 06/06/2021   Chronic left shoulder pain 03/08/2021   Excessive daytime sleepiness 03/08/2021   Attention and concentration deficit 03/08/2021   Overactive bladder 11/25/2020   Impingement syndrome of left shoulder region 02/10/2020   Osteopenia 11/12/2019   Paresthesia 06/30/2019   Narcolepsy and cataplexy 11/05/2017   S/P total hysterectomy 02/19/2017   PVC (premature ventricular contraction) 12/04/2016   Rotator cuff tear 07/10/2016   Biceps tendinitis on left 07/11/2015   Mild cognitive impairment with memory loss 11/09/2014   ADD (attention deficit hyperactivity disorder, inattentive type) 11/09/2014   Diastolic CHF (HCC) 06/30/2013   HYPERCHOLESTEROLEMIA 07/13/2009   Overweight 07/13/2009   HYPERTENSION, BENIGN 09/25/2007   Past Medical History:  Diagnosis Date   Abdominal pain, left lower quadrant 11/08/2015   Abnormal Pap smear 2011   Anxiety    Arthritis    lower back and shoulders   Bronchitis    hx - used abluterol inhaler   Chronic diastolic heart failure (HCC)    Cognitive decline    FIBROIDS, UTERUS 07/02/2009   Qualifier: Diagnosis of  By: Ta MD, Cat     GERD (gastroesophageal reflux disease)    occasional   Hyperlipidemia    Hypersomnia, persistent 11/05/2017   Hypertension    Left shoulder pain 02/14/2016   Memory loss 01/30/2022   Narcolepsy    Osteopenia 04/2019   T score -1.7 FRAX 3.4% / 0.2%   Premature atrial  contractions    PVC's (premature ventricular contractions)    Seasonal allergies    SVD (spontaneous vaginal delivery)    x 3   Weight gain 01/19/2021    Family History  Problem Relation Age of Onset   Hypertension Mother    Diabetes Mother    Stroke Mother    CAD Mother 74   Narcolepsy Mother        not diagnosed   Hypertension Sister    Cancer Sister        Colon cancer   Hypertension Brother    Cancer Brother        throat   Diabetes Father    Heart disease Father     Past Surgical History:  Procedure Laterality Date   ANTERIOR AND POSTERIOR REPAIR N/A 02/19/2017   Procedure: ANTERIOR (CYSTOCELE);  Surgeon: Alfredo Martinez, MD;  Location: [facility];  Service: Urology;  Laterality: N/A;   APPENDECTOMY     COLONOSCOPY     CYSTOSCOPY N/A  02/19/2017   Procedure: CYSTOSCOPY;  Surgeon: Alfredo Martinez, MD;  Location: [facility];  Service: Urology;  Laterality: N/A;   DILATION AND CURETTAGE OF UTERUS  2010   HYSTEROSCOPY W/ ENDOMETRIAL ABLATION  2010   SALPINGOOPHORECTOMY Bilateral 02/19/2017   Procedure: SALPINGO OOPHORECTOMY;  Surgeon: Dara Lords, MD;  Location: [facility];  Service: Gynecology;  Laterality: Bilateral;   TUBAL LIGATION     VAGINAL HYSTERECTOMY N/A 02/19/2017   Procedure: HYSTERECTOMY VAGINAL ,  BSO.;  Surgeon: Dara Lords, MD;  Location: [facility];  Service: Gynecology;  Laterality: N/A;  DO NOT OPEN LAP INSTRUMENTS   WISDOM TOOTH EXTRACTION     Social History   Occupational History   Occupation: Equities trader: [employer]  Tobacco Use   Smoking status: Never    Passive exposure: Never   Smokeless tobacco: Never  Vaping Use   Vaping status: Never Used  Substance and Sexual Activity   Alcohol use: No    Alcohol/week: 0.0 standard drinks of alcohol   Drug use: No   Sexual activity: Never    Birth control/protection: Post-menopausal, Surgical    Comment: 1st intercourse 68 yo-5 partners h/o STI

## 2023-04-15 ENCOUNTER — Emergency Department (HOSPITAL_BASED_OUTPATIENT_CLINIC_OR_DEPARTMENT_OTHER)
Admission: EM | Admit: 2023-04-15 | Discharge: 2023-04-15 | Disposition: A | Discharge status: Home / Self Care | Payer: No Typology Code available for payment source | Attending: Emergency Medicine | Admitting: Emergency Medicine

## 2023-04-15 ENCOUNTER — Emergency Department (HOSPITAL_BASED_OUTPATIENT_CLINIC_OR_DEPARTMENT_OTHER): Payer: No Typology Code available for payment source

## 2023-04-15 ENCOUNTER — Other Ambulatory Visit: Payer: Self-pay

## 2023-04-15 DIAGNOSIS — Z79899 Other long term (current) drug therapy: Secondary | ICD-10-CM | POA: Diagnosis not present

## 2023-04-15 DIAGNOSIS — D72829 Elevated white blood cell count, unspecified: Secondary | ICD-10-CM | POA: Diagnosis not present

## 2023-04-15 DIAGNOSIS — D649 Anemia, unspecified: Secondary | ICD-10-CM | POA: Insufficient documentation

## 2023-04-15 DIAGNOSIS — K5900 Constipation, unspecified: Secondary | ICD-10-CM

## 2023-04-15 DIAGNOSIS — R1030 Lower abdominal pain, unspecified: Secondary | ICD-10-CM

## 2023-04-15 DIAGNOSIS — R1032 Left lower quadrant pain: Secondary | ICD-10-CM | POA: Diagnosis not present

## 2023-04-15 DIAGNOSIS — R1031 Right lower quadrant pain: Secondary | ICD-10-CM | POA: Diagnosis not present

## 2023-04-15 LAB — CBC
HCT: 37.8 % (ref 36.0–46.0)
Hemoglobin: 11.8 g/dL — ABNORMAL LOW (ref 12.0–15.0)
MCH: 27.4 pg (ref 26.0–34.0)
MCHC: 31.2 g/dL (ref 30.0–36.0)
MCV: 87.9 fL (ref 80.0–100.0)
Platelets: 348 10*3/uL (ref 150–400)
RBC: 4.3 MIL/uL (ref 3.87–5.11)
RDW: 16.3 % — ABNORMAL HIGH (ref 11.5–15.5)
WBC: 16.8 10*3/uL — ABNORMAL HIGH (ref 4.0–10.5)
nRBC: 0 % (ref 0.0–0.2)

## 2023-04-15 LAB — COMPREHENSIVE METABOLIC PANEL
ALT: 23 U/L (ref 0–44)
AST: 19 U/L (ref 15–41)
Albumin: 4.3 g/dL (ref 3.5–5.0)
Alkaline Phosphatase: 85 U/L (ref 38–126)
Anion gap: 8 (ref 5–15)
BUN: 13 mg/dL (ref 8–23)
CO2: 27 mmol/L (ref 22–32)
Calcium: 9.7 mg/dL (ref 8.9–10.3)
Chloride: 106 mmol/L (ref 98–111)
Creatinine, Ser: 0.67 mg/dL (ref 0.44–1.00)
GFR, Estimated: >60 mL/min (ref >60)
Glucose, Bld: 99 mg/dL (ref 70–99)
Potassium: 4.1 mmol/L (ref 3.5–5.1)
Sodium: 141 mmol/L (ref 135–145)
Total Bilirubin: 0.4 mg/dL (ref 0.3–1.2)
Total Protein: 7.1 g/dL (ref 6.5–8.1)

## 2023-04-15 LAB — URINALYSIS, ROUTINE W REFLEX MICROSCOPIC
Bacteria, UA: NONE SEEN
Bilirubin Urine: NEGATIVE
Glucose, UA: NEGATIVE mg/dL
Ketones, ur: NEGATIVE mg/dL
Leukocytes,Ua: NEGATIVE
Nitrite: NEGATIVE
Protein, ur: NEGATIVE mg/dL
Specific Gravity, Urine: 1.02 (ref 1.005–1.030)
pH: 6.5 (ref 5.0–8.0)

## 2023-04-15 LAB — LIPASE, BLOOD: Lipase: 16 U/L (ref 11–51)

## 2023-04-15 MED ORDER — DICYCLOMINE HCL 20 MG PO TABS: 20.0000 mg | ORAL_TABLET | Freq: Two times a day (BID) | ORAL | 0 refills | Status: DC

## 2023-04-15 MED ORDER — IOHEXOL 300 MG/ML  SOLN
100.0000 mL | Freq: Once | INTRAMUSCULAR | Status: AC | PRN
  Administered 2023-04-15: 85 mL via INTRAVENOUS

## 2023-04-15 NOTE — ED Notes (Signed)
Discharge instructions, follow up care, and prescription reviewed and explained, pt verbalized understanding and had no further questions on d/c. Pt caox4, ambulatory on d/c.

## 2023-04-15 NOTE — ED Provider Notes (Signed)
[place] EMERGENCY DEPARTMENT AT [facility] Provider Note   CSN: [id number] Arrival date & time: 04/15/23  1235     History  Chief Complaint  Patient presents with   Abdominal Pain    Kathryn Hendricks is a 67 y.o. female.  Patient with history of appendectomy, hysterectomy presents to the emergency department today for evaluation of lower abdominal pain.  Symptoms worsened yesterday.  Today she was having pain with movement prompting emergency department visit.  No fevers, chest pain or shortness of breath.  No nausea, vomiting or diarrhea.  Patient has been constipated and has not had a bowel movement in about 8 days.  No irritative UTI symptoms or hematuria.  She does report fecal and urinary urgency at baseline.  No history of diverticulitis.       Home Medications Prior to Admission medications   Medication Sig Start Date End Date Taking? Authorizing Provider  acetaminophen (TYLENOL) 500 MG tablet Take 500 mg by mouth 2 (two) times daily as needed.    [provider]  albuterol (VENTOLIN HFA) 108 (90 Base) MCG/ACT inhaler Inhale 2 puffs into the lungs every 6 (six) hours as needed for wheezing or shortness of breath. 08/14/22   Ganta, Anupa, DO  fluticasone (FLONASE) 50 MCG/ACT nasal spray Place 1-2 sprays into both nostrils daily. 10/19/21   Martina Sinner, MD  fluticasone (FLOVENT HFA) 110 MCG/ACT inhaler Inhale 2 puffs into the lungs 2 (two) times daily as needed. 08/14/22   Ganta, Anupa, DO  furosemide (LASIX) 20 MG tablet Take 1 tablet (20 mg total) by mouth daily as needed (shortness of breath or puffy ankles). 01/22/23 04/22/23  Doreene Eland, MD  ipratropium (ATROVENT) 0.03 % nasal spray Place 2 sprays into both nostrils 4 (four) times daily as needed for rhinitis 01/24/22   Martina Sinner, MD  lisdexamfetamine (VYVANSE) 70 MG capsule Take 1 capsule (70 mg total) by mouth daily. 04/11/23   Huston Foley, MD  lisinopril (ZESTRIL) 30 MG tablet Take  1 tablet (30 mg total) by mouth at bedtime. 02/20/23   Doreene Eland, MD  montelukast (SINGULAIR) 10 MG tablet Take 1 tablet (10 mg total) by mouth at bedtime. 10/19/21   Martina Sinner, MD  rosuvastatin (CRESTOR) 10 MG tablet Take 1 tablet (10 mg total) by mouth daily. 01/15/23   Gaston Islam., NP  solifenacin (VESICARE) 5 MG tablet Take 1 tablet (5 mg total) by mouth daily. 12/19/22   Romualdo Bolk, MD      Allergies    Penicillins and Penicillin g    Review of Systems   Review of Systems  Physical Exam Updated Vital Signs BP 135/81 (BP Location: Right Arm)   Pulse 66   Temp (!) 97.5 F (36.4 C)   Resp 18   Ht 5\' 4"  (1.626 m)   Wt 72.6 kg   SpO2 100%   BMI 27.46 kg/m  Physical Exam Vitals and nursing note reviewed.  Constitutional:      General: She is not in acute distress.    Appearance: She is well-developed.  HENT:     Head: Normocephalic and atraumatic.     Right Ear: External ear normal.     Left Ear: External ear normal.     Nose: Nose normal.  Eyes:     Conjunctiva/sclera: Conjunctivae normal.  Cardiovascular:     Rate and Rhythm: Normal rate and regular rhythm.     Heart sounds: No murmur heard.  Pulmonary:     Effort: No respiratory distress.     Breath sounds: No wheezing, rhonchi or rales.  Abdominal:     Palpations: Abdomen is soft.     Tenderness: There is abdominal tenderness in the right lower quadrant, suprapubic area and left lower quadrant. There is no guarding or rebound. Negative signs include Murphy's sign and McBurney's sign.  Musculoskeletal:     Cervical back: Normal range of motion and neck supple.     Right lower leg: No edema.     Left lower leg: No edema.  Skin:    General: Skin is warm and dry.     Findings: No rash.  Neurological:     General: No focal deficit present.     Mental Status: She is alert. Mental status is at baseline.     Motor: No weakness.  Psychiatric:        Mood and Affect: Mood normal.      ED Results / Procedures / Treatments   Labs (all labs ordered are listed, but only abnormal results are displayed) Labs Reviewed  CBC - Abnormal; Notable for the following components:      Result Value   WBC 16.8 (*)    Hemoglobin 11.8 (*)    RDW 16.3 (*)    All other components within normal limits  URINALYSIS, ROUTINE W REFLEX MICROSCOPIC - Abnormal; Notable for the following components:   Hgb urine dipstick SMALL (*)    All other components within normal limits  LIPASE, BLOOD  COMPREHENSIVE METABOLIC PANEL    EKG None  Radiology CT ABDOMEN PELVIS W CONTRAST  Result Date: 04/15/2023 CLINICAL DATA:  Left lower quadrant abdominal pain. EXAM: CT ABDOMEN AND PELVIS WITH CONTRAST TECHNIQUE: Multidetector CT imaging of the abdomen and pelvis was performed using the standard protocol following bolus administration of intravenous contrast. RADIATION DOSE REDUCTION: This exam was performed according to the departmental dose-optimization program which includes automated exposure control, adjustment of the mA and/or kV according to patient size and/or use of iterative reconstruction technique. CONTRAST:  85mL OMNIPAQUE IOHEXOL 300 MG/ML  SOLN COMPARISON:  01/18/2021 FINDINGS: Lower chest: Minimal right base atelectasis.  No effusions. Hepatobiliary: No focal hepatic abnormality. Gallbladder unremarkable. Pancreas: No focal abnormality or ductal dilatation. Spleen: No focal abnormality.  Normal size. Adrenals/Urinary Tract: No adrenal abnormality. No focal renal abnormality. No stones or hydronephrosis. Urinary bladder is unremarkable. Stomach/Bowel: Stomach, large and small bowel grossly unremarkable. Vascular/Lymphatic: No evidence of aneurysm or adenopathy. Reproductive: Prior hysterectomy.  No adnexal masses. Other: No free fluid or free air. Musculoskeletal: No acute bony abnormality. IMPRESSION: No acute findings in the abdomen or pelvis. Electronically Signed   By: Charlett Nose M.D.   On:  04/15/2023 18:09    Procedures Procedures    Medications Ordered in ED Medications - No data to display  ED Course/ Medical Decision Making/ A&P    Patient seen and examined. History obtained directly from patient. Work-up including labs, imaging, EKG ordered in triage, if performed, were reviewed.    Labs/EKG: Independently reviewed and interpreted.  This included: CBC with elevated white blood cell count at 16.8, mild anemia with hemoglobin of 11.8 normocytic; CMP unremarkable; lipase normal; UA without compelling signs of UTI.  Imaging: CT abdomen pelvis ordered  Medications/Fluids: Offered pain and nausea medication, patient declines  Most recent vital signs reviewed and are as follows: BP 135/81 (BP Location: Right Arm)   Pulse 66   Temp (!) 97.5 F (36.4 C)  Resp 18   Ht 5\' 4"  (1.626 m)   Wt 72.6 kg   SpO2 100%   BMI 27.46 kg/m   Initial impression: Lower abdominal pain  7:01 PM Reassessment performed. Patient appears very comfortable.  Imaging personally visualized and interpreted including: CT abdomen pelvis, agree no acute findings.  Reviewed pertinent lab work and imaging with patient at bedside. Questions answered.   Most current vital signs reviewed and are as follows: BP (!) 153/90   Pulse (!) 55   Temp 97.7 F (36.5 C) (Oral)   Resp 18   Ht 5\' 4"  (1.626 m)   Wt 72.6 kg   SpO2 96%   BMI 27.46 kg/m   Plan: Discharge to home.   Prescriptions written for: Bentyl, encouraged MiraLAX/Colace at home  Other home care instructions discussed: Parke Simmers diet, close monitoring of symptoms  ED return instructions discussed: The patient was urged to return to the Emergency Department immediately with worsening of current symptoms, worsening abdominal pain, persistent vomiting, blood noted in stools, fever, or any other concerns. The patient verbalized understanding.   Follow-up instructions discussed: Patient encouraged to follow-up with their PCP in 2 days.                                  Medical Decision Making Amount and/or Complexity of Data Reviewed Labs: ordered. Radiology: ordered.  Risk Prescription drug management.   For this patient's complaint of abdominal pain, the following conditions were considered on the differential diagnosis: gastritis/PUD, enteritis/duodenitis, appendicitis, cholelithiasis/cholecystitis, cholangitis, pancreatitis, ruptured viscus, colitis, diverticulitis, small/large bowel obstruction, proctitis, cystitis, pyelonephritis, ureteral colic, aortic dissection, aortic aneurysm. In women, ectopic pregnancy, pelvic inflammatory disease, ovarian cysts, and tubo-ovarian abscess were also considered. Atypical chest etiologies were also considered including ACS, PE, and pneumonia.  The patient's vital signs, pertinent lab work and imaging were reviewed and interpreted as discussed in the ED course. Hospitalization was considered for further testing, treatments, or serial exams/observation. However as patient is well-appearing, has a stable exam, and reassuring studies today, I do not feel that they warrant admission at this time. This plan was discussed with the patient who verbalizes agreement and comfort with this plan and seems reliable and able to return to the Emergency Department with worsening or changing symptoms.         Final Clinical Impression(s) / ED Diagnoses Final diagnoses:  Lower abdominal pain  Constipation, unspecified constipation type    Rx / DC Orders ED Discharge Orders          Ordered    dicyclomine (BENTYL) 20 MG tablet  2 times daily        04/15/23 1857              Renne Crigler, Cordelia Poche 04/15/23 1910    Ernie Avena, MD 04/15/23 2200

## 2023-04-15 NOTE — Discharge Instructions (Addendum)
Please read and follow all provided instructions.  Your diagnoses today include:  1. Lower abdominal pain   2. Constipation, unspecified constipation type     Tests performed today include: Blood cell counts and platelets: White blood cell count was high Kidney and liver function tests Pancreas function test (called lipase) Urine test to look for infection A blood or urine test for pregnancy (women only) CT scan of the abdomen pelvis: Did not show any acute findings, significant infections.  You have stool noted throughout the colon. Vital signs. See below for your results today.   Medications prescribed:  Bentyl - medication for intestinal cramps and spasms  Take any prescribed medications only as directed.  Home care instructions:  Follow any educational materials contained in this packet.  Follow-up instructions: Please follow-up with your primary care provider in the next 2 days for further evaluation of your symptoms.    Return instructions:  SEEK IMMEDIATE MEDICAL ATTENTION IF: The pain does not go away or becomes severe  A temperature above 101F develops  Repeated vomiting occurs (multiple episodes)  The pain becomes localized to portions of the abdomen. The right side could possibly be appendicitis. In an adult, the left lower portion of the abdomen could be colitis or diverticulitis.  Blood is being passed in stools or vomit (bright red or black tarry stools)  You develop chest pain, difficulty breathing, dizziness or fainting, or become confused, poorly responsive, or inconsolable (young children) If you have any other emergent concerns regarding your health  Additional Information: Abdominal (belly) pain can be caused by many things. Your caregiver performed an examination and possibly ordered blood/urine tests and imaging (CT scan, x-rays, ultrasound). Many cases can be observed and treated at home after initial evaluation in the emergency department. Even though you  are being discharged home, abdominal pain can be unpredictable. Therefore, you need a repeated exam if your pain does not resolve, returns, or worsens. Most patients with abdominal pain don't have to be admitted to the hospital or have surgery, but serious problems like appendicitis and gallbladder attacks can start out as nonspecific pain. Many abdominal conditions cannot be diagnosed in one visit, so follow-up evaluations are very important.  Your vital signs today were: BP (!) 153/90   Pulse (!) 55   Temp 97.7 F (36.5 C) (Oral)   Resp 18   Ht 5\' 4"  (1.626 m)   Wt 72.6 kg   SpO2 96%   BMI 27.46 kg/m  If your blood pressure (bp) was elevated above 135/85 this visit, please have this repeated by your doctor within one month. --------------

## 2023-04-15 NOTE — ED Triage Notes (Signed)
Pt to ED c/o lower abdominal pain x 1 day. Reports last BM 1 week ago, reports hx constipation. No urinary symptoms.

## 2023-04-23 ENCOUNTER — Encounter: Payer: Self-pay | Admitting: Family Medicine

## 2023-04-23 ENCOUNTER — Other Ambulatory Visit: Payer: Self-pay

## 2023-04-23 ENCOUNTER — Ambulatory Visit: Payer: No Typology Code available for payment source | Admitting: Family Medicine

## 2023-04-23 ENCOUNTER — Other Ambulatory Visit (HOSPITAL_COMMUNITY): Payer: Self-pay

## 2023-04-23 VITALS — BP 141/80 | HR 67 | Ht 64.0 in | Wt 160.4 lb

## 2023-04-23 DIAGNOSIS — R319 Hematuria, unspecified: Secondary | ICD-10-CM | POA: Diagnosis not present

## 2023-04-23 DIAGNOSIS — R7309 Other abnormal glucose: Secondary | ICD-10-CM

## 2023-04-23 DIAGNOSIS — J45909 Unspecified asthma, uncomplicated: Secondary | ICD-10-CM

## 2023-04-23 DIAGNOSIS — R7303 Prediabetes: Secondary | ICD-10-CM | POA: Insufficient documentation

## 2023-04-23 DIAGNOSIS — R4586 Emotional lability: Secondary | ICD-10-CM | POA: Diagnosis not present

## 2023-04-23 DIAGNOSIS — R109 Unspecified abdominal pain: Secondary | ICD-10-CM

## 2023-04-23 DIAGNOSIS — I10 Essential (primary) hypertension: Secondary | ICD-10-CM | POA: Diagnosis not present

## 2023-04-23 LAB — POCT UA - MICROSCOPIC ONLY
Bacteria, U Microscopic: NONE SEEN
WBC, Ur, HPF, POC: NONE SEEN (ref 0–5)

## 2023-04-23 LAB — POCT GLYCOSYLATED HEMOGLOBIN (HGB A1C): HbA1c, POC (prediabetic range): 5.9 % (ref 5.7–6.4)

## 2023-04-23 LAB — POCT URINALYSIS DIP (MANUAL ENTRY)
Bilirubin, UA: NEGATIVE
Glucose, UA: NEGATIVE mg/dL
Ketones, POC UA: NEGATIVE mg/dL
Leukocytes, UA: NEGATIVE
Nitrite, UA: NEGATIVE
Protein Ur, POC: NEGATIVE mg/dL
Spec Grav, UA: >=1.03 — AB (ref 1.010–1.025)
Urobilinogen, UA: 0.2 E.U./dL
pH, UA: 5.5 (ref 5.0–8.0)

## 2023-04-23 NOTE — Progress Notes (Signed)
    SUBJECTIVE:   CHIEF COMPLAINT / HPI:   Abdominal Pain This is a recurrent (Recurrentr stomach pain with a new episode 1 week ago. She went to the ED for this) problem. The problem has been rapidly improving (No pain today.). The pain is at a severity of 0/10. The abdominal pain does not radiate. Associated symptoms include constipation. Pertinent negatives include no anorexia, diarrhea, nausea or vomiting. Associated symptoms comments: She was given Miralax in the hospital with minimal improvement. Last BM was 7 days ago. No blood in her stool. Stool with soft consistency. Treatments tried: Miralax.   HTN:  She is complaint with her Lisinopril 30 mg QD. She currently have her grandkids living with her which raised her stress level. BP otherwise good at home. She denies cardiopulm symptoms.   PreDM2:  She is here for f/u. She cut back on sweets. She is concern due to strong FHx of DM in her mom and dad.  Asthma: Started on Flovent many years ago by pulm. She is compliant with her inhalers  Depression: A lot of stress lately with low mood. She denies SI or HI.  PERTINENT  PMH / PSH: PMHx reviewed  OBJECTIVE:   BP (!) 141/80   Pulse 67   Ht 5\' 4"  (1.626 m)   Wt 160 lb 6.4 oz (72.8 kg)   SpO2 99%   BMI 27.53 kg/m   Physical Exam Vitals and nursing note reviewed.  Cardiovascular:     Rate and Rhythm: Normal rate and regular rhythm.     Heart sounds: Normal heart sounds. No murmur heard. Pulmonary:     Effort: Pulmonary effort is normal. No respiratory distress.     Breath sounds: Normal breath sounds. No wheezing.  Abdominal:     General: Abdomen is flat. Bowel sounds are normal. There is no distension.     Palpations: Abdomen is soft. There is no mass.     Tenderness: There is no abdominal tenderness. There is no guarding.  Musculoskeletal:     Left lower leg: No edema.      ASSESSMENT/PLAN:   Abdominal pain Etiology unclear ?? Constipation Up to date with her  colon cancer screen CT abdomen done in the ED reviewed with no acute findings UA neg for infection but did have blood in the urine with > 3 RBC in the ED Unclear if this is related Continue Miralax as recommended F/U soon if she becomes symptomatic again  Hematuria RBC > 3 on microscopy in the ED Repeat UA showed small blood with RBC 0-3 No gross hematuria Moderate risk for bladder cancer given age and FHx of cancer We'll plan to repeat in about 4-8 weeks Urology referral for second opinion in the future if this persists Will send a Mychart result note to the patient   HYPERTENSION, BENIGN BP stable  Prediabetes A1C improved from 6.4 to 5.8 Dietary counseling provided Repeat in 6-12 months  Asthma Normal PFT in 2021 She is compliant with Flovent and Albuterol prn She is scheduled to see Pulm tomorrow at 3 pm She discuss possibility of slowly weaning of Flovent - (Flovent holiday) F/U with me as needed  Mood disturbance NO SI or HI Stress related She is interested in therapy Referral information provided F/U with me as needed      Janit Pagan, MD [facility]

## 2023-04-23 NOTE — Assessment & Plan Note (Signed)
BP stable.

## 2023-04-23 NOTE — Progress Notes (Signed)
I did call and discussed UA result and plan with her. F/U in 4-8 weeks as discussed for repeat UA

## 2023-04-23 NOTE — Assessment & Plan Note (Signed)
A1C improved from 6.4 to 5.8 Dietary counseling provided Repeat in 6-12 months

## 2023-04-23 NOTE — Assessment & Plan Note (Signed)
NO SI or HI Stress related She is interested in therapy Referral information provided F/U with me as needed

## 2023-04-23 NOTE — Assessment & Plan Note (Signed)
Normal PFT in 2021 She is compliant with Flovent and Albuterol prn She is scheduled to see Pulm tomorrow at 3 pm She discuss possibility of slowly weaning of Flovent - (Flovent holiday) F/U with me as needed

## 2023-04-23 NOTE — Patient Instructions (Signed)

## 2023-04-23 NOTE — Assessment & Plan Note (Signed)
RBC > 3 on microscopy in the ED Repeat UA showed small blood with RBC 0-3 No gross hematuria Moderate risk for bladder cancer given age and FHx of cancer We'll plan to repeat in about 4-8 weeks Urology referral for second opinion in the future if this persists Will send a Mychart result note to the patient

## 2023-04-23 NOTE — Assessment & Plan Note (Signed)
Etiology unclear ?? Constipation Up to date with her colon cancer screen CT abdomen done in the ED reviewed with no acute findings UA neg for infection but did have blood in the urine with > 3 RBC in the ED Unclear if this is related Continue Miralax as recommended F/U soon if she becomes symptomatic again

## 2023-04-24 ENCOUNTER — Encounter: Payer: Self-pay | Admitting: Primary Care

## 2023-04-24 ENCOUNTER — Ambulatory Visit (INDEPENDENT_AMBULATORY_CARE_PROVIDER_SITE_OTHER): Payer: No Typology Code available for payment source | Admitting: Primary Care

## 2023-04-24 VITALS — BP 132/84 | HR 70 | Temp 98.8°F | Ht 64.0 in | Wt 160.0 lb

## 2023-04-24 DIAGNOSIS — J4520 Mild intermittent asthma, uncomplicated: Secondary | ICD-10-CM

## 2023-04-24 NOTE — Patient Instructions (Addendum)
Asthma symptoms are well controlled with as needed steroid inhaler Continue medications as directed Could consider stopping Flovent and changing to an inhaler called Airsupra in the future (this is a newer rescue inhaler but not covered by a lot of insurances)   Recommendations: Use Fluticasone (flovent) inhaler 1-2 puffs every 12 hours as needed only for asthma symptoms Use Albuterol 2 puffs every 4-6 hours for rescue treatment for shortness of breath/wheezing  Continue Singulair at bedtime   Follow-up 1 year with Dr. Francine Graven or sooner if needed   Asthma, Adult  Asthma is a condition that causes swelling and narrowing of the airways. These are the passages that lead from the nose and mouth down into the lungs. When asthma symptoms get worse it is called an asthma attack or flare. This can make it hard to breathe. Asthma flares can range from minor to life-threatening. There is no cure for asthma, but medicines and lifestyle changes can help to control it. What are the causes? It is not known exactly what causes asthma, but certain things can cause asthma symptoms to get worse (triggers). What can trigger an asthma attack? Cigarette smoke. Mold. Dust. Your pet's skin flakes (dander). Cockroaches. Pollen. Air pollution (like household cleaners, wood smoke, smog, or Therapist, occupational). What are the signs or symptoms? Trouble breathing (shortness of breath). Coughing. Making high-pitched whistling sounds when you breathe, most often when you breathe out (wheezing). Chest tightness. Tiredness with little activity. Poor exercise tolerance. How is this treated? Controller medicines that help prevent asthma symptoms. Fast-acting reliever or rescue medicines. These give short-term relief of asthma symptoms. Allergy medicines if your attacks are brought on by allergens. Medicines to help control the body's defense (immune) system. Staying away from the things that cause asthma  attacks. Follow these instructions at home: Avoiding triggers in your home Do not allow anyone to smoke in your home. Limit use of fireplaces and wood stoves. Get rid of pests (such as roaches and mice) and their droppings. Keep your home clean. Clean your floors. Dust regularly. Use cleaning products that do not smell. Wash bed sheets and blankets every week in hot water. Dry them in a dryer. Have someone vacuum when you are not home. Change your heating and air conditioning filters often. Use blankets that are made of polyester or cotton. General instructions Take over-the-counter and prescription medicines only as told by your doctor. Do not smoke or use any products that contain nicotine or tobacco. If you need help quitting, ask your doctor. Stay away from secondhand smoke. Avoid doing things outdoors when allergen counts are high and when air quality is low. Warm up before you exercise. Take time to cool down after exercise. Use a peak flow meter as told by your doctor. A peak flow meter is a tool that measures how well your lungs are working. Keep track of the peak flow meter's readings. Write them down. Follow your asthma action plan. This is a written plan for taking care of your asthma and treating your attacks. Make sure you get all the shots (vaccines) that your doctor recommends. Ask your doctor about a flu shot and a pneumonia shot. Keep all follow-up visits. Contact a doctor if: You have wheezing, shortness of breath, or a cough even while taking medicine to prevent attacks. The mucus you cough up (sputum) is thicker than usual. The mucus you cough up changes from clear or white to yellow, green, gray, or is bloody. You have problems from the medicine  you are taking, such as: A rash. Itching. Swelling. Trouble breathing. You need reliever medicines more than 2-3 times a week. Your peak flow reading is still at 50-79% of your personal best after following the action  plan for 1 hour. You have a fever. Get help right away if: You seem to be worse and are not responding to medicine during an asthma attack. You are short of breath even at rest. You get short of breath when doing very little activity. You have trouble eating, drinking, or talking. You have chest pain or tightness. You have a fast heartbeat. Your lips or fingernails start to turn blue. You are light-headed or dizzy, or you faint. Your peak flow is less than 50% of your personal best. You feel too tired to breathe normally. These symptoms may be an emergency. Get help right away. Call 911. Do not wait to see if the symptoms will go away. Do not drive yourself to the hospital. Summary Asthma is a long-term (chronic) condition in which the airways get tight and narrow. An asthma attack can make it hard to breathe. Asthma cannot be cured, but medicines and lifestyle changes can help control it. Make sure you understand how to avoid triggers and how and when to use your medicines. Avoid things that can cause allergy symptoms (allergens). These include animal skin flakes (dander) and pollen from trees or grass. Avoid things that pollute the air. These may include household cleaners, wood smoke, smog, or chemical odors. This information is not intended to replace advice given to you by your health care provider. Make sure you discuss any questions you have with your health care provider. Document Revised: 05/22/2021 Document Reviewed: 05/22/2021 Elsevier Patient Education  2024 [employer].

## 2023-04-24 NOTE — Progress Notes (Signed)
@Patient  ID: Kathryn Hendricks, female    DOB: [birth year], 67 y.o.   MRN: [id number]  Chief Complaint  Patient presents with   Follow-up    Doing well.  Hot weather causing dyspnea.    Referring provider: Doreene Eland, MD  HPI: 67 year old female, never smoked.  Past medical history significant for hypertension, diastolic heart failure, asthma, sleep apnea, GERD, ADD.  Patient of Dr. Francine Graven.  04/24/2023- Interim hx  Patient presents today for 1 year follow-up. She follows with Dr. Francine Graven for asthma.  She is doing well. She is maintained on Flovent 110 mcg 2 puffs twice daily and Singulair 10 mg at bedtime.  She gets a little short of breath in the heat/humidity and during allergy season. She uses Flovent inhaler as needed, typically once a month. She has occasional wheezing symptoms when coughing. She is limited at times with ADLS. She used to mow the lawn, now needs to take breaks.  ACT score 15.  Allergies  Allergen Reactions   Penicillins Itching, Shortness Of Breath, Swelling and Other (See Comments)    Has patient had a PCN reaction causing immediate rash, facial/tongue/throat swelling, SOB or lightheadedness with hypotension: Yes  Has patient had a PCN reaction causing severe rash involving mucus membranes or skin necrosis: No  Has patient had a PCN reaction that required hospitalization: No  Has patient had a PCN reaction occurring within the last 10 years: No  If all of the above answers are "NO", then may proceed with Cephalosporin use.   Penicillin G Other (See Comments)    Immunization History  Administered Date(s) Administered   COVID-19, mRNA, vaccine(Comirnaty)12 years and older 06/25/2022   Influenza Whole 06/28/2007, 07/27/2008, 04/28/2016   Influenza,inj,Quad PF,6+ Mos 05/16/2015, 05/31/2020, 06/06/2021, 06/25/2022   Influenza,inj,quad, With Preservative 07/07/2019, 05/27/2020   Influenza-Unspecified 05/27/2013, 05/17/2014, 04/27/2016, 06/11/2019    Moderna Sars-Covid-2 Vaccination 09/22/2019, 10/09/2019   PFIZER Comirnaty(Gray Top)Covid-19 Tri-Sucrose Vaccine 06/25/2022   PFIZER(Purple Top)SARS-COV-2 Vaccination 08/26/2019, 09/16/2019, 08/18/2020   PNEUMOCOCCAL CONJUGATE-20 01/22/2023   Pfizer Covid-19 Vaccine Bivalent Booster 52yrs & up 06/06/2021   Td 06/28/2007   Tdap 06/14/2017   Zoster Recombinant(Shingrix) 07/14/2019, 10/14/2019    Past Medical History:  Diagnosis Date   Abdominal pain, left lower quadrant 11/08/2015   Abnormal Pap smear 2011   Anxiety    Arthritis    lower back and shoulders   Bronchitis    hx - used abluterol inhaler   Chronic diastolic heart failure (HCC)    Cognitive decline    FIBROIDS, UTERUS 07/02/2009   Qualifier: Diagnosis of  By: Ta MD, Cat     GERD (gastroesophageal reflux disease)    occasional   Hyperlipidemia    Hypersomnia, persistent 11/05/2017   Hypertension    Left shoulder pain 02/14/2016   Memory loss 01/30/2022   Narcolepsy    Osteopenia 04/2019   T score -1.7 FRAX 3.4% / 0.2%   Premature atrial contractions    PVC's (premature ventricular contractions)    Seasonal allergies    SVD (spontaneous vaginal delivery)    x 3   Weight gain 01/19/2021    Tobacco History: Social History   Tobacco Use  Smoking Status Never   Passive exposure: Never  Smokeless Tobacco Never   Counseling given: Not Answered   Outpatient Medications Prior to Visit  Medication Sig Dispense Refill   acetaminophen (TYLENOL) 500 MG tablet Take 500 mg by mouth 2 (two) times daily as needed.     albuterol (VENTOLIN  HFA) 108 (90 Base) MCG/ACT inhaler Inhale 2 puffs into the lungs every 6 (six) hours as needed for wheezing or shortness of breath. 18 g 1   Blood Pressure Monitoring (OMRON 3 SERIES BP MONITOR) DEVI See admin instructions.     fluticasone (FLONASE) 50 MCG/ACT nasal spray Place 1-2 sprays into both nostrils daily. 16 g 11   fluticasone (FLOVENT HFA) 110 MCG/ACT inhaler Inhale 2 puffs into  the lungs 2 (two) times daily as needed. 12 g 12   furosemide (LASIX) 20 MG tablet Take 1 tablet (20 mg total) by mouth daily as needed (shortness of breath or puffy ankles). 90 tablet 1   ipratropium (ATROVENT) 0.03 % nasal spray Place 2 sprays into both nostrils 4 (four) times daily as needed for rhinitis 30 mL 3   lisdexamfetamine (VYVANSE) 70 MG capsule Take 1 capsule (70 mg total) by mouth daily. (Patient taking differently: Take 70 mg by mouth daily. Patient states this is for narcolepsy.) 30 capsule 0   lisinopril (ZESTRIL) 30 MG tablet Take 1 tablet (30 mg total) by mouth at bedtime. 90 tablet 1   montelukast (SINGULAIR) 10 MG tablet Take 1 tablet (10 mg total) by mouth at bedtime. 30 tablet 11   rosuvastatin (CRESTOR) 10 MG tablet Take 1 tablet (10 mg total) by mouth daily. 90 tablet 2   solifenacin (VESICARE) 5 MG tablet Take 1 tablet (5 mg total) by mouth daily. 90 tablet 3   dicyclomine (BENTYL) 20 MG tablet Take 1 tablet (20 mg total) by mouth 2 (two) times daily. (Patient not taking: Reported on 04/23/2023) 20 tablet 0   No facility-administered medications prior to visit.      Review of Systems  Review of Systems  Constitutional: Negative.   HENT: Negative.    Respiratory:  Positive for shortness of breath. Negative for chest tightness and wheezing.   Cardiovascular: Negative.      Physical Exam  BP 132/84 (BP Location: Right Arm, Patient Position: Sitting, Cuff Size: Normal)   Pulse 70   Temp 98.8 F (37.1 C) (Oral)   Ht 5\' 4"  (1.626 m)   Wt 160 lb (72.6 kg)   SpO2 98%   BMI 27.46 kg/m  Physical Exam Constitutional:      Appearance: Normal appearance.  HENT:     Head: Normocephalic and atraumatic.  Cardiovascular:     Rate and Rhythm: Normal rate and regular rhythm.  Pulmonary:     Effort: Pulmonary effort is normal.     Breath sounds: Normal breath sounds.  Musculoskeletal:        General: Normal range of motion.  Skin:    General: Skin is warm and  dry.  Neurological:     General: No focal deficit present.     Mental Status: She is alert and oriented to person, place, and time. Mental status is at baseline.  Psychiatric:        Mood and Affect: Mood normal.        Behavior: Behavior normal.        Thought Content: Thought content normal.        Judgment: Judgment normal.      Lab Results:  CBC    Component Value Date/Time   WBC 16.8 (H) 04/15/2023 1311   RBC 4.30 04/15/2023 1311   HGB 11.8 (L) 04/15/2023 1311   HGB 12.2 06/08/2022 1652   HCT 37.8 04/15/2023 1311   HCT 39.1 06/08/2022 1652   PLT 348 04/15/2023 1311  PLT 253 06/08/2022 1652   MCV 87.9 04/15/2023 1311   MCV 88 06/08/2022 1652   MCH 27.4 04/15/2023 1311   MCHC 31.2 04/15/2023 1311   RDW 16.3 (H) 04/15/2023 1311   RDW 14.1 06/08/2022 1652   LYMPHSABS 2.4 04/05/2020 1155   MONOABS 0.4 04/05/2020 1155   EOSABS 0.2 04/05/2020 1155   BASOSABS 0.1 04/05/2020 1155    BMET    Component Value Date/Time   NA 141 04/15/2023 1311   NA 144 01/30/2022 1044   K 4.1 04/15/2023 1311   CL 106 04/15/2023 1311   CO2 27 04/15/2023 1311   GLUCOSE 99 04/15/2023 1311   BUN 13 04/15/2023 1311   BUN 10 01/30/2022 1044   CREATININE 0.67 04/15/2023 1311   CREATININE 0.68 04/27/2016 1558   CALCIUM 9.7 04/15/2023 1311   GFRNONAA >60 04/15/2023 1311   GFRNONAA >89 09/10/2013 1136   GFRAA 108 02/23/2020 1123   GFRAA >89 09/10/2013 1136    BNP    Component Value Date/Time   BNP 144.6 (H) 11/13/2022 1809    ProBNP    Component Value Date/Time   PROBNP 245.80 (H) 05/07/2013 1413    Imaging: CT ABDOMEN PELVIS W CONTRAST  Result Date: 04/15/2023 CLINICAL DATA:  Left lower quadrant abdominal pain. EXAM: CT ABDOMEN AND PELVIS WITH CONTRAST TECHNIQUE: Multidetector CT imaging of the abdomen and pelvis was performed using the standard protocol following bolus administration of intravenous contrast. RADIATION DOSE REDUCTION: This exam was performed according to the  departmental dose-optimization program which includes automated exposure control, adjustment of the mA and/or kV according to patient size and/or use of iterative reconstruction technique. CONTRAST:  85mL OMNIPAQUE IOHEXOL 300 MG/ML  SOLN COMPARISON:  01/18/2021 FINDINGS: Lower chest: Minimal right base atelectasis.  No effusions. Hepatobiliary: No focal hepatic abnormality. Gallbladder unremarkable. Pancreas: No focal abnormality or ductal dilatation. Spleen: No focal abnormality.  Normal size. Adrenals/Urinary Tract: No adrenal abnormality. No focal renal abnormality. No stones or hydronephrosis. Urinary bladder is unremarkable. Stomach/Bowel: Stomach, large and small bowel grossly unremarkable. Vascular/Lymphatic: No evidence of aneurysm or adenopathy. Reproductive: Prior hysterectomy.  No adnexal masses. Other: No free fluid or free air. Musculoskeletal: No acute bony abnormality. IMPRESSION: No acute findings in the abdomen or pelvis. Electronically Signed   By: Charlett Nose M.D.   On: 04/15/2023 18:09   XR Cervical Spine 2 or 3 views  Result Date: 04/12/2023 AP and lateral views of cervical spine reviewed.  Loss of lordosis is present.  Mild to moderate degenerative changes at multiple levels in the cervical spine with worst changes at C4-C5, C5-C6, C6-C7.  No fracture.  No spondylolisthesis.  XR Shoulder Left  Result Date: 04/12/2023 AP, scapular Y, axillary views of shoulder reviewed.  No significant degenerative changes noted of the acromioclavicular or glenohumeral joints.  No loss of acromiohumeral interval.  No fracture or dislocation noted.  Lung fields are clear.   MM 3D DIAGNOSTIC MAMMOGRAM BILATERAL BREAST  Result Date: 04/08/2023 CLINICAL DATA:  67 year old female with diffuse bilateral breast pain. EXAM: DIGITAL DIAGNOSTIC BILATERAL MAMMOGRAM WITH TOMOSYNTHESIS AND CAD TECHNIQUE: Bilateral digital diagnostic mammography and breast tomosynthesis was performed. The images were evaluated  with computer-aided detection. COMPARISON:  Previous exam(s). ACR Breast Density Category b: There are scattered areas of fibroglandular density. FINDINGS: Full field views of both breasts demonstrate no suspicious mass, distortion or worrisome calcifications. IMPRESSION: No evidence of breast malignancy. RECOMMENDATION: Bilateral screening mammogram in 1 year. Recommend clinical follow-up as indicated. Any further workup should  be based on clinical grounds. I have discussed the findings, causes of breast pain and recommendations with the patient. If applicable, a reminder letter will be sent to the patient regarding the next appointment. BI-RADS CATEGORY  1: Negative. Electronically Signed   By: Harmon Pier M.D.   On: 04/08/2023 14:47     Assessment & Plan:   Asthma - Stable; No recent exacerbations. Asthma symptoms are well controlled with prn ICS inhaler. Heat/humidity worsen asthma symptoms. ACT score 15.  - Continue Flovent two puffs twice daily prn and Singulair 10mg  at bedtime  - Can consider trial off ICS and change Albuterol rescue inhaler to Airsupra hfa 2 puffs every 6 hours if covered by insurance  - FU annually or as needed  Sleep apnea - Continue CPAP nightly, managed by neurology    Glenford Bayley, NP 04/27/2023

## 2023-04-27 NOTE — Assessment & Plan Note (Signed)
-   Continue CPAP nightly, managed by neurology

## 2023-04-27 NOTE — Assessment & Plan Note (Addendum)
-   Stable; No recent exacerbations. Asthma symptoms are well controlled with prn ICS inhaler. Heat/humidity worsen asthma symptoms. ACT score 15.  - Continue Flovent two puffs twice daily prn and Singulair 10mg  at bedtime  - Can consider trial off ICS and change Albuterol rescue inhaler to Airsupra hfa 2 puffs every 6 hours if covered by insurance  - FU annually or as needed

## 2023-05-03 ENCOUNTER — Telehealth: Payer: Self-pay | Admitting: Pulmonary Disease

## 2023-05-03 NOTE — Telephone Encounter (Signed)
Patient needs office visit notes stating that she has asthma and uses an inhaler. Last office visit notes were mailed to her.

## 2023-05-06 NOTE — Patient Instructions (Signed)
Below is our plan:  We will continue Vyvanse 70mg  daily.   Please continue using your CPAP regularly. While your insurance requires that you use CPAP at least 4 hours each night on 70% of the nights, I recommend, that you not skip any nights and use it throughout the night if you can. Getting used to CPAP and staying with the treatment long term does take time and patience and discipline. Untreated obstructive sleep apnea when it is moderate to severe can have an adverse impact on cardiovascular health and raise her risk for heart disease, arrhythmias, hypertension, congestive heart failure, stroke and diabetes. Untreated obstructive sleep apnea causes sleep disruption, nonrestorative sleep, and sleep deprivation. This can have an impact on your day to day functioning and cause daytime sleepiness and impairment of cognitive function, memory loss, mood disturbance, and problems focussing. Using CPAP regularly can improve these symptoms.  We will update supply orders, today.   Please make sure you are staying well hydrated. I recommend 50-60 ounces daily. Well balanced diet and regular exercise encouraged. Consistent sleep schedule with 6-8 hours recommended.   Please continue follow up with care team as directed.   Follow up with me in 6 months  You may receive a survey regarding today's visit. I encourage you to leave honest feed back as I do use this information to improve patient care. Thank you for seeing me today!

## 2023-05-06 NOTE — Progress Notes (Unsigned)
PATIENT: Kathryn Hendricks DOB: [birth year]  REASON FOR VISIT: follow up HISTORY FROM: patient  No chief complaint on file.    HISTORY OF PRESENT ILLNESS:  05/06/23 ALL:  [name] returns for follow up for OSA on CPAP, hypersomnia and memory loss.   She continues Vyvanse 70mg  daily.   10/30/2022 ALL: [name] returns for follow up for OSA non compliant with CPAP use, hypersomnia versus narcolepsy (negative HLA but positive MSLT in 2014) and memory loss.   She was continued on Vyvanse 70mg  daily but reports not being able to get it due to cost for the past 7 months. She has been more tired and sleepy. She is now covered with [place] [employer] Medicare and Medicaid.   She was referred to Dr Kieth Brightly for formal neurocognitive testing. She reports not hearing back regarding an appointment. She was approved for social security disability. She reports having three different memory test but unsure of the results. She is trying read more and doing crossword puzzles.   She has resumed CPAP therapy. She has tried using it every night for the past week.     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 She reports still being kept awake by CPAP and is therefor not in compliance.  She also may need attention and neurocognitive testing: CPAP compliance on average is 2 hours and 6 minutes she has used the machine 35 out of 90 days and over 4 hours 28 out of 90 days.  Minimum pressure was 5 maximum pressure of 10 cm of water without EPR if she has the ability to change humidification herself, residual AHI was 7.2 more central than obstructive apneas remaining 95th percentile pressure was 9.8 cm water air leak at the 95th percentile were 14.6 L/min. Nasal pillows were used. She has claustrophobia.   09/11/2021 ALL: Kathryn Hendricks is a 67 y.o. female here today for follow up for OSA on CPAP and narcolepsy with cataplexy.  HLA  testing negative, MSLT positive 2014.   She continues to have difficulty with CPAP compliance. Dr Dohmeier adjusted tubing temperature, reduced humidity and fitted her for new mask at last appt. She reports that she can not tolerate pressure settings. She feels that her nose burns with use. She does not feeling better and less tired when she uses CPAP. She feels less forgetful when using CPAP all night.   At last visit with Dr Vickey Huger, Adderall was discontinued and modafinil started. It is unclear from notes if she took modafinil or when medication was switched to Vyvanse. She thinks she "had problems with modafinil". She was switched to Vyvanse in 03/2021. Niyah called the office 04/24/2021 reporting Vyvanse 20mg  not effective. She had increased to 40mg  and had not noted any improvement. Dose was increased to 70mg . She feels that it may be working better than Adderall. She reports that some days are better than others. She gets very tired during the day. She is no longer working due to shoulder injury and not as active. She falls asleep if she sits for prolonged periods of time.   She was advised to follow up with PCP for request to have formal attention deficit testing. She has not yet reached out. She feels that she is more withdrawn and not wanting to be around people. She has lost some family members around Christmas and feels more depressed. No SI. She reports symptoms seem to be  improving with time. She continues to worry about her memory. She has more trouble remembering how to spell words that she feels should be easy to spell, ie. girl or succeed.   She lives alone. She manages her home and medications independently. She is able to perform ADLs independently. She drives without difficulty. No concerns of falling asleep while driving. She reports having a difficult time in school. She was in special ed classes. Learning has always been difficult for her. Her mother was diagnosed with dementia.      HISTORY: (copied from Dr Dohmeier's previous note)  03-08-2021:  Kathryn Hendricks reports that she has difficulties with her rotator cuff and biceps tendon she had a surgical repair but has not healed as she would like.  So she has been out of work for the last 6 months.  Her Epworth Sleepiness Scale has remained highly elevated at 17 out of 24 points and her explanation was that she cannot use her CPAP reliably.  Apparently it feels to her as if there is much too much pressure suddenly imposed on her.  So she has not been using it recently and it is waking her up.  The DME most telling her to bring in her CPAP for sleep for adjustments.  The patient reports a degree of sleepiness that may not all be up to obstructive sleep apnea but to her underlying condition of persistent hypersomnia.  The patient was diagnosed in November 2020 with sleep apnea it was a very mild sleep apnea with an AHI of 7.3/h the REM AHI was 19.4 and her sleep appeared fragmented snoring was present but oxygen saturation was stable throughout the night.  I have ordered an auto titration CPAP between 5 and 12 cmH2O with 1 cm EPR and we had discussed alternatives I wonder if she may want to try a dental device given that her apnea is REM dependent.     Given her very high Epworth sleepiness scores and the difficulties with CPAP use it may be worse while reducing her AHI is somewhat rather than not treating it at all.   REVIEW OF SYSTEMS: Out of a complete 14 system review of symptoms, the patient complains only of the following symptoms, memory loss, daytime sleepiness, inattention, and all other reviewed systems are negative.  ESS: 10/24  ALLERGIES: Allergies  Allergen Reactions   Penicillins Itching, Shortness Of Breath, Swelling and Other (See Comments)    Has patient had a PCN reaction causing immediate rash, facial/tongue/throat swelling, SOB or lightheadedness with hypotension: Yes  Has patient had a PCN reaction causing  severe rash involving mucus membranes or skin necrosis: No  Has patient had a PCN reaction that required hospitalization: No  Has patient had a PCN reaction occurring within the last 10 years: No  If all of the above answers are "NO", then may proceed with Cephalosporin use.   Penicillin G Other (See Comments)    HOME MEDICATIONS: Outpatient Medications Prior to Visit  Medication Sig Dispense Refill   acetaminophen (TYLENOL) 500 MG tablet Take 500 mg by mouth 2 (two) times daily as needed.     albuterol (VENTOLIN HFA) 108 (90 Base) MCG/ACT inhaler Inhale 2 puffs into the lungs every 6 (six) hours as needed for wheezing or shortness of breath. 18 g 1   Blood Pressure Monitoring (OMRON 3 SERIES BP MONITOR) DEVI See admin instructions.     fluticasone (FLONASE) 50 MCG/ACT nasal spray Place 1-2 sprays into both nostrils daily. 16 g  11   fluticasone (FLOVENT HFA) 110 MCG/ACT inhaler Inhale 2 puffs into the lungs 2 (two) times daily as needed. 12 g 12   furosemide (LASIX) 20 MG tablet Take 1 tablet (20 mg total) by mouth daily as needed (shortness of breath or puffy ankles). 90 tablet 1   ipratropium (ATROVENT) 0.03 % nasal spray Place 2 sprays into both nostrils 4 (four) times daily as needed for rhinitis 30 mL 3   lisdexamfetamine (VYVANSE) 70 MG capsule Take 1 capsule (70 mg total) by mouth daily. (Patient taking differently: Take 70 mg by mouth daily. Patient states this is for narcolepsy.) 30 capsule 0   lisinopril (ZESTRIL) 30 MG tablet Take 1 tablet (30 mg total) by mouth at bedtime. 90 tablet 1   montelukast (SINGULAIR) 10 MG tablet Take 1 tablet (10 mg total) by mouth at bedtime. 30 tablet 11   rosuvastatin (CRESTOR) 10 MG tablet Take 1 tablet (10 mg total) by mouth daily. 90 tablet 2   solifenacin (VESICARE) 5 MG tablet Take 1 tablet (5 mg total) by mouth daily. 90 tablet 3   No facility-administered medications prior to visit.    PAST MEDICAL HISTORY: Past Medical History:   Diagnosis Date   Abdominal pain, left lower quadrant 11/08/2015   Abnormal Pap smear 2011   Anxiety    Arthritis    lower back and shoulders   Bronchitis    hx - used abluterol inhaler   Chronic diastolic heart failure (HCC)    Cognitive decline    FIBROIDS, UTERUS 07/02/2009   Qualifier: Diagnosis of  By: Ta MD, Cat     GERD (gastroesophageal reflux disease)    occasional   Hyperlipidemia    Hypersomnia, persistent 11/05/2017   Hypertension    Left shoulder pain 02/14/2016   Memory loss 01/30/2022   Narcolepsy    Osteopenia 04/2019   T score -1.7 FRAX 3.4% / 0.2%   Premature atrial contractions    PVC's (premature ventricular contractions)    Seasonal allergies    SVD (spontaneous vaginal delivery)    x 3   Weight gain 01/19/2021    PAST SURGICAL HISTORY: Past Surgical History:  Procedure Laterality Date   ANTERIOR AND POSTERIOR REPAIR N/A 02/19/2017   Procedure: ANTERIOR (CYSTOCELE);  Surgeon: Alfredo Martinez, MD;  Location: [facility];  Service: Urology;  Laterality: N/A;   APPENDECTOMY     COLONOSCOPY     CYSTOSCOPY N/A 02/19/2017   Procedure: CYSTOSCOPY;  Surgeon: Alfredo Martinez, MD;  Location: [facility];  Service: Urology;  Laterality: N/A;   DILATION AND CURETTAGE OF UTERUS  2010   HYSTEROSCOPY W/ ENDOMETRIAL ABLATION  2010   SALPINGOOPHORECTOMY Bilateral 02/19/2017   Procedure: SALPINGO OOPHORECTOMY;  Surgeon: Dara Lords, MD;  Location: [facility];  Service: Gynecology;  Laterality: Bilateral;   TUBAL LIGATION     VAGINAL HYSTERECTOMY N/A 02/19/2017   Procedure: HYSTERECTOMY VAGINAL ,  BSO.;  Surgeon: Dara Lords, MD;  Location: [facility];  Service: Gynecology;  Laterality: N/A;  DO NOT OPEN LAP INSTRUMENTS   WISDOM TOOTH EXTRACTION      FAMILY HISTORY: Family History  Problem Relation Age of Onset   Hypertension Mother    Diabetes Mother    Stroke Mother    CAD Mother 43   Narcolepsy Mother        not diagnosed   Hypertension Sister    Cancer Sister         Colon cancer   Hypertension Brother  Cancer Brother        throat   Diabetes Father    Heart disease Father     SOCIAL HISTORY: Social History   Socioeconomic History   Marital status: Divorced    Spouse name: Not on file   Number of children: 3   Years of education: 12   Highest education level: Not on file  Occupational History   Occupation: Equities trader: [employer]  Tobacco Use   Smoking status: Never    Passive exposure: Never   Smokeless tobacco: Never  Vaping Use   Vaping status: Never Used  Substance and Sexual Activity   Alcohol use: No    Alcohol/week: 0.0 standard drinks of alcohol   Drug use: No   Sexual activity: Never    Birth control/protection: Post-menopausal, Surgical    Comment: 1st intercourse 20 yo-5 partners h/o STI  Other Topics Concern   Not on file  Social History Narrative   Caffeine 1 cup coffee in am.   Social Determinants of Health   Financial Resource Strain: Not on file  Food Insecurity: No Food Insecurity (01/22/2023)   Hunger Vital Sign    Worried About Running Out of Food in the Last Year: Never true    Ran Out of Food in the Last Year: Never true  Transportation Needs: No Transportation Needs (01/22/2023)   PRAPARE - Administrator, Civil Service (Medical): No    Lack of Transportation (Non-Medical): No  Physical Activity: Not on file  Stress: Not on file  Social Connections: Moderately Integrated (01/22/2023)   Social Connection and Isolation Panel [NHANES]    Frequency of Communication with Friends and Family: More than three times a week    Frequency of Social Gatherings with Friends and Family: More than three times a week    Attends Religious Services: More than 4 times per year    Active Member of [employer] or Organizations: Yes    Attends Engineer, structural: More than 4 times per year    Marital Status: Divorced  Intimate Partner Violence: Not At Risk (01/22/2023)   Humiliation,  Afraid, Rape, and Kick questionnaire    Fear of Current or Ex-Partner: No    Emotionally Abused: No    Physically Abused: No    Sexually Abused: No     PHYSICAL EXAM  There were no vitals filed for this visit.   There is no height or weight on file to calculate BMI.  Generalized: Well developed, in no acute distress  Cardiology: normal rate and rhythm, no murmur noted Respiratory: clear to auscultation bilaterally  Neurological examination  Mentation: Alert oriented to time, place, history taking. Follows all commands speech and language fluent Cranial nerve II-XII: Pupils were equal round reactive to light. Extraocular movements were full, visual field were full  Motor: The motor testing reveals 5 over 5 strength of all 4 extremities. Good symmetric motor tone is noted throughout.  Gait and station: Gait is normal.    DIAGNOSTIC DATA (LABS, IMAGING, TESTING) - I reviewed patient records, labs, notes, testing and imaging myself where available.     10/30/2022    2:40 PM 09/11/2021    1:14 PM 03/05/2017    8:36 AM  MMSE - Mini Mental State Exam  Orientation to time 5 5 4   Orientation to Place 5 5 5   Registration 3 3 3   Attention/ Calculation 0 0 0  Recall 2 2 1   Language- name 2 objects  2 2 2   Language- repeat 0 0 0  Language- follow 3 step command 3 2 3   Language- read & follow direction 1 1 1   Write a sentence 0 1 0  Copy design 0 0 1  Total score 21 21 20      Lab Results  Component Value Date   WBC 16.8 (H) 04/15/2023   HGB 11.8 (L) 04/15/2023   HCT 37.8 04/15/2023   MCV 87.9 04/15/2023   PLT 348 04/15/2023      Component Value Date/Time   NA 141 04/15/2023 1311   NA 144 01/30/2022 1044   K 4.1 04/15/2023 1311   CL 106 04/15/2023 1311   CO2 27 04/15/2023 1311   GLUCOSE 99 04/15/2023 1311   BUN 13 04/15/2023 1311   BUN 10 01/30/2022 1044   CREATININE 0.67 04/15/2023 1311   CREATININE 0.68 04/27/2016 1558   CALCIUM 9.7 04/15/2023 1311   PROT 7.1  04/15/2023 1311   PROT 6.6 02/23/2020 1123   ALBUMIN 4.3 04/15/2023 1311   ALBUMIN 4.3 02/23/2020 1123   AST 19 04/15/2023 1311   ALT 23 04/15/2023 1311   ALKPHOS 85 04/15/2023 1311   BILITOT 0.4 04/15/2023 1311   BILITOT 0.3 02/23/2020 1123   GFRNONAA >60 04/15/2023 1311   GFRNONAA >89 09/10/2013 1136   GFRAA 108 02/23/2020 1123   GFRAA >89 09/10/2013 1136   Lab Results  Component Value Date   CHOL 132 01/30/2022   HDL 44 01/30/2022   LDLCALC 71 01/30/2022   LDLDIRECT 136 (H) 08/09/2008   TRIG 87 01/30/2022   CHOLHDL 3.0 01/30/2022   Lab Results  Component Value Date   HGBA1C 5.9 04/23/2023   Lab Results  Component Value Date   VITAMINB12 650 01/30/2022   Lab Results  Component Value Date   TSH 1.260 01/30/2022     ASSESSMENT AND PLAN 67 y.o. year old female  has a past medical history of Abdominal pain, left lower quadrant (11/08/2015), Abnormal Pap smear (2011), Anxiety, Arthritis, Bronchitis, Chronic diastolic heart failure (HCC), Cognitive decline, FIBROIDS, UTERUS (07/02/2009), GERD (gastroesophageal reflux disease), Hyperlipidemia, Hypersomnia, persistent (11/05/2017), Hypertension, Left shoulder pain (02/14/2016), Memory loss (01/30/2022), Narcolepsy, Osteopenia (04/2019), Premature atrial contractions, PVC's (premature ventricular contractions), Seasonal allergies, SVD (spontaneous vaginal delivery), and Weight gain (01/19/2021). here with     ICD-10-CM   1. OSA on CPAP  G47.33     2. Mild cognitive impairment with memory loss  G31.84     3. Excessive daytime sleepiness  G47.19          Kanon P Petersheim continues to have difficulty with CPAP compliance but is improving. AHI is 3 at current autopap setting. Compliance report reveals sub optimal compliance now at 67% daily and 60% 4 hour usage. She was encouraged to continue using CPAP nightly and for greater than 4 hours each night. We will update supply orders as indicated. Risks of untreated sleep apnea review and  education materials provided. She will continue Vyvanse 70mg  daily. May consider talking with PCP if she wishes to have additional testing for ADD. We have discussed MMSE. Stable from 2023. She is completely oriented and able to perform recall tasks. Serial subtractions are difficult as well as following instructions. Formal neurocognitive testing discussed but she is not interested at this time as she recently had memory testing with DSS showing anxiety as most likely contributor to deficits. She has been approved for disability. She will monitor mood closely and follow up with PCP for any  concerns of anxiety or depression. Healthy lifestyle habits encouraged. She will follow up with me in 6 months. She verbalizes understanding and agreement with this plan.    No orders of the defined types were placed in this encounter.    No orders of the defined types were placed in this encounter.    I spent 30 minutes of face-to-face and non-face-to-face time with patient.  This included previsit chart review, lab review, study review, order entry, electronic health record documentation, patient education.   Shawnie Dapper, FNP-C 05/06/2023, 4:46 PM [facility] [address], [address] [place], [place] [address] [phone]

## 2023-05-07 ENCOUNTER — Ambulatory Visit (INDEPENDENT_AMBULATORY_CARE_PROVIDER_SITE_OTHER): Payer: No Typology Code available for payment source | Admitting: Family Medicine

## 2023-05-07 ENCOUNTER — Other Ambulatory Visit (HOSPITAL_COMMUNITY): Payer: Self-pay

## 2023-05-07 ENCOUNTER — Encounter: Payer: Self-pay | Admitting: Family Medicine

## 2023-05-07 VITALS — BP 131/80 | HR 72 | Ht 64.0 in | Wt 155.0 lb

## 2023-05-07 DIAGNOSIS — G4719 Other hypersomnia: Secondary | ICD-10-CM

## 2023-05-07 DIAGNOSIS — G4733 Obstructive sleep apnea (adult) (pediatric): Secondary | ICD-10-CM

## 2023-05-07 DIAGNOSIS — G3184 Mild cognitive impairment, so stated: Secondary | ICD-10-CM | POA: Diagnosis not present

## 2023-05-07 MED ORDER — LISDEXAMFETAMINE DIMESYLATE 70 MG PO CAPS
70.0000 mg | ORAL_CAPSULE | Freq: Every day | ORAL | 0 refills | Status: DC
Start: 2023-05-07 — End: 2023-07-11
  Filled 2023-05-07 – 2023-05-27 (×2): qty 30, 30d supply, fill #0

## 2023-05-08 DIAGNOSIS — J4520 Mild intermittent asthma, uncomplicated: Secondary | ICD-10-CM | POA: Diagnosis not present

## 2023-05-08 DIAGNOSIS — Z6826 Body mass index (BMI) 26.0-26.9, adult: Secondary | ICD-10-CM | POA: Diagnosis not present

## 2023-05-08 DIAGNOSIS — E261 Secondary hyperaldosteronism: Secondary | ICD-10-CM | POA: Diagnosis not present

## 2023-05-08 DIAGNOSIS — R7303 Prediabetes: Secondary | ICD-10-CM | POA: Diagnosis not present

## 2023-05-08 DIAGNOSIS — E785 Hyperlipidemia, unspecified: Secondary | ICD-10-CM | POA: Diagnosis not present

## 2023-05-08 DIAGNOSIS — G47411 Narcolepsy with cataplexy: Secondary | ICD-10-CM | POA: Diagnosis not present

## 2023-05-08 DIAGNOSIS — E663 Overweight: Secondary | ICD-10-CM | POA: Diagnosis not present

## 2023-05-08 DIAGNOSIS — I509 Heart failure, unspecified: Secondary | ICD-10-CM | POA: Diagnosis not present

## 2023-05-08 DIAGNOSIS — Z008 Encounter for other general examination: Secondary | ICD-10-CM | POA: Diagnosis not present

## 2023-05-08 DIAGNOSIS — G4733 Obstructive sleep apnea (adult) (pediatric): Secondary | ICD-10-CM | POA: Diagnosis not present

## 2023-05-17 ENCOUNTER — Telehealth: Payer: Self-pay | Admitting: Family Medicine

## 2023-05-17 NOTE — Telephone Encounter (Signed)
[email or web address] has called asking that the order for the CPAP an supplies be sent to :[facility] ph#[phone] fax#[phone]  Marcelino Duster is asking pt be called once this has been resolved.

## 2023-05-20 NOTE — Telephone Encounter (Signed)
Faxed to [facility] for DME supplies and orders per pts insurance request [facility]).  28pgs as pt a new patient for them.  [phone]

## 2023-05-20 NOTE — Telephone Encounter (Signed)
Called patient however she was current at a doctor appointment. She asked I call her back later.

## 2023-05-22 NOTE — Telephone Encounter (Signed)
I called pt to let her know that the DME orders and info sent to [facility] per her request.  She appreciated the call.  She has a p/w will drop off to see if can fill out, I mentioned fee but unable to see if we would do form.  She verbalized understanding.

## 2023-05-23 DIAGNOSIS — Z0289 Encounter for other administrative examinations: Secondary | ICD-10-CM

## 2023-05-27 ENCOUNTER — Ambulatory Visit (INDEPENDENT_AMBULATORY_CARE_PROVIDER_SITE_OTHER): Payer: No Typology Code available for payment source

## 2023-05-27 ENCOUNTER — Other Ambulatory Visit (HOSPITAL_COMMUNITY): Payer: Self-pay

## 2023-05-27 DIAGNOSIS — Z23 Encounter for immunization: Secondary | ICD-10-CM | POA: Diagnosis not present

## 2023-05-27 NOTE — Progress Notes (Signed)
Patient presents to nurse clinic for Flu vaccine.  Vaccine administered without complication.  See admin for details.

## 2023-05-28 NOTE — Telephone Encounter (Signed)
I checked on [facility] airview portal and appears her max pressure was adjusted to 12 on 05/21/23:

## 2023-05-28 NOTE — Telephone Encounter (Signed)
[facility] Kathryn Hendricks) received an order to increase 10 to 12 from your facility. We cannot increase setting because machine is not from our company. But if she gets a new machine from [place] then we set at new setting. Would like a call from the nurse.

## 2023-06-03 ENCOUNTER — Telehealth: Payer: Self-pay

## 2023-06-03 NOTE — Telephone Encounter (Addendum)
Pt returned call, she stated [facility] needed Most recent OV notes from Neuro/Sleep doctor for narcolepsy and memory loss pt has. Most of this form is not pertaining to [place] and cannot be completed. Pt is aware. I will defer to Stanton Kidney to see if they just need office notes sent to them and not the form completed. Form given back to [place], MR

## 2023-06-03 NOTE — Telephone Encounter (Addendum)
Received fax from [facility] that insurance won't cover supplies/services. Pt machine is not with [facility], but with [facility]. I called patient and informed that she can reach out to [facility] to get supplies. Pt verbalized she understood, number given for pt to call.

## 2023-06-03 NOTE — Telephone Encounter (Signed)
Received attending physician statement -progress report form from [facility] for patient. Per chart review, Dorene Grebe MD Orthopedic, has been doing patient disability and recently competed this for in June. I attempted to contact pt to get clarity, LVM rq call back.

## 2023-06-26 ENCOUNTER — Telehealth: Payer: Self-pay | Admitting: Orthopedic Surgery

## 2023-06-26 DIAGNOSIS — M7542 Impingement syndrome of left shoulder: Secondary | ICD-10-CM | POA: Diagnosis not present

## 2023-06-26 DIAGNOSIS — M25511 Pain in right shoulder: Secondary | ICD-10-CM | POA: Diagnosis not present

## 2023-06-26 NOTE — Telephone Encounter (Signed)
Kathryn Hendricks saw her last.  Radiographs at that time showed not much arthritis and no loss of acromiohumeral interval.  Rotator cuff strength was good.  He thought that it could be coming from her neck which I think is possible.  Right shoulder not radiographs.  There was no indication that surgery was indicated based on the available information.  However we did talk about or Kathryn Hendricks talked about getting imaging of her neck and we could have gotten more imaging on her shoulder but it is up to the her really where she wants to go and how she wants to proceed.  I do not really think at that time there was a clear indication that further reconstructive surgery was indicated based on plain radiographs which were essentially normal.

## 2023-06-26 NOTE — Telephone Encounter (Signed)
IC patient. She states she was a little confused as to what is going on. She stated she previously worked at [employer] and has history of left shoulder RCTR and BT done by Dr Rennis Chris but stated it did not heal correctly.    She said she went back to Dr Dub Mikes office today, new xrays were obtained and was told that she needed reconstruction on both of her shoulders. She would like to know why she was not told she needed this surgery when she was here back in August?  She said that she has decreased strength and pain/limitations now impacting her ADL's. She is left hand dom.

## 2023-06-26 NOTE — Telephone Encounter (Signed)
Pt came in stating she would like to speak with Kathryn Hendricks about her shoulder issues she has concerns. Please advise

## 2023-06-26 NOTE — Telephone Encounter (Signed)
Agree with what Dr August Saucer said.  Offered further workup at her last visit but she was not interested at that time

## 2023-06-28 ENCOUNTER — Other Ambulatory Visit (HOSPITAL_COMMUNITY): Payer: Self-pay

## 2023-06-28 ENCOUNTER — Telehealth: Payer: Self-pay | Admitting: Orthopedic Surgery

## 2023-06-28 NOTE — Telephone Encounter (Signed)
IC to discuss, no answer. LMVM for patient letting her know was returning her call to discuss.

## 2023-06-28 NOTE — Telephone Encounter (Signed)
Patient returned call advised she could not pick up the phone when it rang. Patient asked if sh could get a call back. Please see previous message.  The number to contact patient is [phone]

## 2023-06-30 NOTE — Telephone Encounter (Signed)
thx

## 2023-07-01 NOTE — Telephone Encounter (Signed)
IC discussed with patient. Scheduled OV for her to further discuss with Dr August Saucer. She will also try to obtain images on disc prior to her appointment so that Dr August Saucer may review at that time.

## 2023-07-02 ENCOUNTER — Telehealth: Payer: Self-pay | Admitting: Orthopedic Surgery

## 2023-07-02 NOTE — Telephone Encounter (Signed)
Patient called and said she needed you to call her she has some questions to ask you. She wouldn't tell me. CB#[phone]

## 2023-07-05 NOTE — Telephone Encounter (Signed)
Spoke with patient and she has a lot of questions about the difference in Supples diagnosis and ours, I told her lets just wait until we see her on the 22nd and go from there?

## 2023-07-11 ENCOUNTER — Other Ambulatory Visit: Payer: Self-pay | Admitting: Family Medicine

## 2023-07-11 ENCOUNTER — Other Ambulatory Visit (HOSPITAL_COMMUNITY): Payer: Self-pay

## 2023-07-11 NOTE — Telephone Encounter (Signed)
Last seen on 05/07/23 Follow up scheduled on 11/28/23 Last filled on 05/27/23 #30 tablets (30 day supply) Rx pending to be signed

## 2023-07-15 ENCOUNTER — Other Ambulatory Visit (HOSPITAL_COMMUNITY): Payer: Self-pay

## 2023-07-15 MED ORDER — LISDEXAMFETAMINE DIMESYLATE 70 MG PO CAPS
70.0000 mg | ORAL_CAPSULE | Freq: Every day | ORAL | 0 refills | Status: DC
Start: 2023-07-15 — End: 2023-09-09
  Filled 2023-07-15: qty 30, 30d supply, fill #0

## 2023-07-16 ENCOUNTER — Other Ambulatory Visit (HOSPITAL_COMMUNITY): Payer: Self-pay

## 2023-07-19 ENCOUNTER — Ambulatory Visit: Payer: No Typology Code available for payment source | Admitting: Orthopedic Surgery

## 2023-07-31 ENCOUNTER — Telehealth: Payer: Self-pay

## 2023-07-31 NOTE — Telephone Encounter (Signed)
Dr. Verlin Dike would like a peer to peer.  CB# [phone].  Please advise.  Thank you

## 2023-08-02 ENCOUNTER — Telehealth: Payer: Self-pay | Admitting: Orthopedic Surgery

## 2023-08-02 ENCOUNTER — Other Ambulatory Visit (HOSPITAL_COMMUNITY): Payer: Self-pay

## 2023-08-02 ENCOUNTER — Other Ambulatory Visit: Payer: Self-pay

## 2023-08-02 MED ORDER — RSVPREF3 VAC RECOMB ADJUVANTED 120 MCG/0.5ML IM SUSR
0.5000 mL | Freq: Once | INTRAMUSCULAR | 0 refills | Status: AC
  Filled 2023-08-02: qty 0.5, 1d supply, fill #0

## 2023-08-02 NOTE — Telephone Encounter (Signed)
Emma from [facility] called, requesting a Peer to Peer with Dr. Aura Dials. Number [phone]

## 2023-08-05 ENCOUNTER — Ambulatory Visit: Payer: No Typology Code available for payment source | Admitting: Orthopedic Surgery

## 2023-08-05 DIAGNOSIS — M25512 Pain in left shoulder: Secondary | ICD-10-CM | POA: Diagnosis not present

## 2023-08-05 DIAGNOSIS — G8929 Other chronic pain: Secondary | ICD-10-CM | POA: Diagnosis not present

## 2023-08-05 NOTE — Telephone Encounter (Signed)
duplicate

## 2023-08-05 NOTE — Telephone Encounter (Signed)
We have not placed any restrictions on patient. Not seen since August 2024. Patient has appt with Dr August Saucer today and he can discuss with patient at that time.

## 2023-08-06 ENCOUNTER — Encounter: Payer: Self-pay | Admitting: Orthopedic Surgery

## 2023-08-06 ENCOUNTER — Ambulatory Visit: Payer: No Typology Code available for payment source

## 2023-08-06 DIAGNOSIS — Z23 Encounter for immunization: Secondary | ICD-10-CM

## 2023-08-06 NOTE — Progress Notes (Unsigned)
Office Visit Note   Patient: Kathryn Hendricks           Date of Birth: 04-05-1956           MRN: [id number] Visit Date: 08/05/2023 Requested by: Doreene Eland, MD [address] [place],  [place] [address] PCP: Doreene Eland, MD  Subjective: Chief Complaint  Patient presents with   Right Shoulder - Pain   Left Shoulder - Pain    HPI: Kathryn Hendricks is a 67 y.o. female who presents to the office reporting bilateral shoulder pain.  Last seen by [facility] 04/16/2023.  Having some shoulder pain in that left shoulder.  Patient states that she went for second opinion and was told both shoulders had "bad arthritis".  She did bring her disc of shoulder radiographs from that evaluation.  She has had prior left shoulder rotator cuff repair done several years ago..                ROS: All systems reviewed are negative as they relate to the chief complaint within the history of present illness.  Patient denies fevers or chills.  Assessment & Plan: Visit Diagnoses:  1. Chronic left shoulder pain     Plan: Impression is probable right shoulder rotator cuff tear based on coarseness with passive range of motion.  Has a little weakness with external rotation but is not too significant.  The left shoulder actually looks pretty good in terms of rotator cuff strength and functional range of motion.  Granted it is not as strong or as pain-free as she wants but overall the left shoulder looks very good in terms of absence of coarseness and well-maintained passive range of motion with pretty good strength all things considered.  Plan at this time would be bilateral imaging studies consisting of MRI arthrograms of both shoulders if her symptoms have reached a threshold for intervention.  I am not sure that they have.  The right shoulder rotator cuff has been torn but the extent of that tear and the repair ability of the tear are in question particularly in light of her left shoulder.  She will follow-up  with [place] as needed.  Follow-Up Instructions: No follow-ups on file.   Orders:  No orders of the defined types were placed in this encounter.  No orders of the defined types were placed in this encounter.     Procedures: No procedures performed   Clinical Data: No additional findings.  Objective: Vital Signs: There were no vitals taken for this visit.  Physical Exam:  Constitutional: Patient appears well-developed HEENT:  Head: Normocephalic Eyes:EOM are normal Neck: Normal range of motion Cardiovascular: Normal rate Pulmonary/chest: Effort normal Neurologic: Patient is alert Skin: Skin is warm Psychiatric: Patient has normal mood and affect  Ortho Exam: Ortho exam on the left demonstrates no coarse grinding or crepitus with internal/external rotation of that left arm.  External rotation strength is about 5- out of 5 on the left compared to 5- out of 5 on the right.  She does have forward flexion abduction of both shoulders above 90 degrees.  Subscap strength 5+ out of 5 bilaterally.  No discrete AC joint tenderness is present.  The coarseness on the right side feels a little bit worse than the coarseness she had on the left side about a year prior to when she eventually underwent left shoulder surgery.  Specialty Comments:  No specialty comments available.  Imaging: No results found.   PMFS  History: Patient Active Problem List   Diagnosis Date Noted   Abdominal pain 04/23/2023   Hematuria 04/23/2023   Prediabetes 04/23/2023   Asthma 04/23/2023   Breast tenderness 03/15/2023   Fall 03/15/2023   Family history of malignant neoplasm of digestive organs 12/19/2022   Gastroesophageal reflux disease 12/19/2022   Bilateral lower extremity edema 06/08/2022   Right knee pain 01/30/2022   Mood disturbance 01/30/2022   Sleep apnea 06/06/2021   Chronic left shoulder pain 03/08/2021   Excessive daytime sleepiness 03/08/2021   Attention and concentration deficit 03/08/2021    Overactive bladder 11/25/2020   Impingement syndrome of left shoulder region 02/10/2020   Osteopenia 11/12/2019   Paresthesia 06/30/2019   Narcolepsy and cataplexy 11/05/2017   S/P total hysterectomy 02/19/2017   PVC (premature ventricular contraction) 12/04/2016   Rotator cuff tear 07/10/2016   Biceps tendinitis on left 07/11/2015   Mild cognitive impairment with memory loss 11/09/2014   Attention deficit hyperactivity disorder (ADHD), predominantly inattentive type 11/09/2014   Diastolic CHF (HCC) 06/30/2013   HYPERCHOLESTEROLEMIA 07/13/2009   Overweight 07/13/2009   HYPERTENSION, BENIGN 09/25/2007   Past Medical History:  Diagnosis Date   Abdominal pain, left lower quadrant 11/08/2015   Abnormal Pap smear 2011   Anxiety    Arthritis    lower back and shoulders   Bronchitis    hx - used abluterol inhaler   Chronic diastolic heart failure (HCC)    Cognitive decline    FIBROIDS, UTERUS 07/02/2009   Qualifier: Diagnosis of  By: Ta MD, Cat     GERD (gastroesophageal reflux disease)    occasional   Hyperlipidemia    Hypersomnia, persistent 11/05/2017   Hypertension    Left shoulder pain 02/14/2016   Memory loss 01/30/2022   Narcolepsy    Osteopenia 04/2019   T score -1.7 FRAX 3.4% / 0.2%   Premature atrial contractions    PVC's (premature ventricular contractions)    Seasonal allergies    SVD (spontaneous vaginal delivery)    x 3   Weight gain 01/19/2021    Family History  Problem Relation Age of Onset   Hypertension Mother    Diabetes Mother    Stroke Mother    CAD Mother 66   Narcolepsy Mother        not diagnosed   Hypertension Sister    Cancer Sister        Colon cancer   Hypertension Brother    Cancer Brother        throat   Diabetes Father    Heart disease Father     Past Surgical History:  Procedure Laterality Date   ANTERIOR AND POSTERIOR REPAIR N/A 02/19/2017   Procedure: ANTERIOR (CYSTOCELE);  Surgeon: Alfredo Martinez, MD;  Location: [facility];   Service: Urology;  Laterality: N/A;   APPENDECTOMY     COLONOSCOPY     CYSTOSCOPY N/A 02/19/2017   Procedure: CYSTOSCOPY;  Surgeon: Alfredo Martinez, MD;  Location: [facility];  Service: Urology;  Laterality: N/A;   DILATION AND CURETTAGE OF UTERUS  2010   HYSTEROSCOPY W/ ENDOMETRIAL ABLATION  2010   SALPINGOOPHORECTOMY Bilateral 02/19/2017   Procedure: SALPINGO OOPHORECTOMY;  Surgeon: Dara Lords, MD;  Location: [facility];  Service: Gynecology;  Laterality: Bilateral;   TUBAL LIGATION     VAGINAL HYSTERECTOMY N/A 02/19/2017   Procedure: HYSTERECTOMY VAGINAL ,  BSO.;  Surgeon: Dara Lords, MD;  Location: [facility];  Service: Gynecology;  Laterality: N/A;  DO NOT OPEN LAP INSTRUMENTS  WISDOM TOOTH EXTRACTION     Social History   Occupational History   Occupation: Equities trader: [employer]  Tobacco Use   Smoking status: Never    Passive exposure: Never   Smokeless tobacco: Never  Vaping Use   Vaping status: Never Used  Substance and Sexual Activity   Alcohol use: No    Alcohol/week: 0.0 standard drinks of alcohol   Drug use: No   Sexual activity: Never    Birth control/protection: Post-menopausal, Surgical    Comment: 1st intercourse 70 yo-5 partners h/o STI

## 2023-08-06 NOTE — Progress Notes (Signed)
Patient presents to nurse clinic for Covid Vaccine.  Vaccine administered without complication. See admin for details.

## 2023-08-27 ENCOUNTER — Other Ambulatory Visit: Payer: Self-pay

## 2023-08-27 ENCOUNTER — Other Ambulatory Visit: Payer: Self-pay | Admitting: Family Medicine

## 2023-08-29 ENCOUNTER — Other Ambulatory Visit (HOSPITAL_COMMUNITY): Payer: Self-pay

## 2023-08-29 MED ORDER — LISINOPRIL 30 MG PO TABS
30.0000 mg | ORAL_TABLET | Freq: Every day | ORAL | 1 refills | Status: DC
  Filled 2023-08-29: qty 90, 90d supply, fill #0
  Filled 2023-12-02: qty 90, 90d supply, fill #1

## 2023-09-09 ENCOUNTER — Other Ambulatory Visit: Payer: Self-pay | Admitting: Family Medicine

## 2023-09-09 ENCOUNTER — Other Ambulatory Visit (HOSPITAL_COMMUNITY): Payer: Self-pay

## 2023-09-13 ENCOUNTER — Other Ambulatory Visit (HOSPITAL_COMMUNITY): Payer: Self-pay

## 2023-09-13 ENCOUNTER — Other Ambulatory Visit: Payer: Self-pay

## 2023-09-13 ENCOUNTER — Other Ambulatory Visit: Payer: Self-pay | Admitting: Family Medicine

## 2023-09-13 ENCOUNTER — Other Ambulatory Visit: Payer: Self-pay | Admitting: Neurology

## 2023-09-13 DIAGNOSIS — R051 Acute cough: Secondary | ICD-10-CM

## 2023-09-13 DIAGNOSIS — J4520 Mild intermittent asthma, uncomplicated: Secondary | ICD-10-CM

## 2023-09-13 MED ORDER — LISDEXAMFETAMINE DIMESYLATE 70 MG PO CAPS
70.0000 mg | ORAL_CAPSULE | Freq: Every day | ORAL | 0 refills | Status: DC
Start: 2023-09-13 — End: 2023-11-28
  Filled 2023-09-13: qty 30, 30d supply, fill #0

## 2023-09-13 MED ORDER — FLUTICASONE PROPIONATE HFA 110 MCG/ACT IN AERO
2.0000 | INHALATION_SPRAY | Freq: Two times a day (BID) | RESPIRATORY_TRACT | 2 refills | Status: AC | PRN
  Filled 2023-09-13: qty 12, 30d supply, fill #0

## 2023-09-13 NOTE — Telephone Encounter (Signed)
Pt called wanting to know when this will be called in for her. Please advise.  

## 2023-09-13 NOTE — Progress Notes (Signed)
erroe

## 2023-09-16 ENCOUNTER — Other Ambulatory Visit (HOSPITAL_COMMUNITY): Payer: Self-pay

## 2023-09-17 ENCOUNTER — Other Ambulatory Visit (HOSPITAL_COMMUNITY): Payer: Self-pay

## 2023-10-02 ENCOUNTER — Other Ambulatory Visit (HOSPITAL_COMMUNITY): Payer: Self-pay

## 2023-10-24 ENCOUNTER — Other Ambulatory Visit: Payer: Self-pay | Admitting: Pulmonary Disease

## 2023-10-24 ENCOUNTER — Other Ambulatory Visit (HOSPITAL_COMMUNITY): Payer: Self-pay

## 2023-10-24 ENCOUNTER — Other Ambulatory Visit: Payer: Self-pay | Admitting: Nurse Practitioner

## 2023-10-25 ENCOUNTER — Other Ambulatory Visit (HOSPITAL_COMMUNITY): Payer: Self-pay

## 2023-10-25 MED ORDER — IPRATROPIUM BROMIDE 0.03 % NA SOLN
2.0000 | Freq: Four times a day (QID) | NASAL | 3 refills | Status: AC
  Filled 2023-10-25: qty 30, 22d supply, fill #0
  Filled 2024-01-06: qty 30, 22d supply, fill #1

## 2023-10-25 MED ORDER — ROSUVASTATIN CALCIUM 10 MG PO TABS
10.0000 mg | ORAL_TABLET | Freq: Every day | ORAL | 0 refills | Status: DC
  Filled 2023-10-25: qty 90, 90d supply, fill #0

## 2023-11-28 ENCOUNTER — Other Ambulatory Visit (HOSPITAL_COMMUNITY): Payer: Self-pay

## 2023-11-28 ENCOUNTER — Encounter: Payer: Self-pay | Admitting: Family Medicine

## 2023-11-28 ENCOUNTER — Ambulatory Visit: Payer: No Typology Code available for payment source | Admitting: Family Medicine

## 2023-11-28 VITALS — BP 135/74 | HR 66 | Ht 64.0 in | Wt 150.0 lb

## 2023-11-28 DIAGNOSIS — G3184 Mild cognitive impairment, so stated: Secondary | ICD-10-CM

## 2023-11-28 DIAGNOSIS — G4733 Obstructive sleep apnea (adult) (pediatric): Secondary | ICD-10-CM

## 2023-11-28 MED ORDER — LISDEXAMFETAMINE DIMESYLATE 70 MG PO CAPS
70.0000 mg | ORAL_CAPSULE | Freq: Every day | ORAL | 0 refills | Status: DC
Start: 2023-11-28 — End: 2024-01-06
  Filled 2023-11-28: qty 30, 30d supply, fill #0

## 2023-11-28 NOTE — Progress Notes (Signed)
 PATIENT: Kathryn Hendricks DOB: Apr 03, 1956  REASON FOR VISIT: follow up HISTORY FROM: patient  Chief Complaint  Patient presents with   Follow-up    Pt in 1 alone Pt here for cpap amd memory f/u Pt states had a lot of congestion in the last 2 months Pt states couldn't use pap machine because coughing up a lot of mucous . Pt states getting appointments mixed up Pt states left stove on, pt states misplaced items. Pt states have gotten lost while driving       HISTORY OF PRESENT ILLNESS:  11/28/23 ALL:  Kathryn Hendricks returns for follow up for OSA on CPAP, hypersomnia and memory loss.   She reports doing better on CPAP therapy. She is trying to use therapy every night. She admits that she continues to fall asleep prior to starting therapy. There have been nights where she stayed up all night watching after her son. He recently overdosed and passed away. Erasmo Score was last week. She denies concerns with machine or supplies.   She continues Vyvanse 70mg  daily. Last filled 04/11/2023. She does feel it works most days. Memory seems fairly stable. She does have times when she forgets what she is doing or where she is going but she is able to figure it out fairly quickly. She now lives with a roommate. She does exercise daily at the [facility]     05/07/2023 ALL:  Kathryn Hendricks returns for follow up for OSA on CPAP, hypersomnia and memory loss.   She reports doing better on CPAP therapy. She is trying to use therapy every night. She admits that she continues to fall asleep prior to starting therapy. She denies concerns with machine or supplies.   She continues Vyvanse 70mg  daily. Last filled 04/11/2023. She does feel it works most days. Memory seems fairly stable. She has had more days, recently, where she feels exhausted. She is under more stress as her son was recently asked to leave her home d/t drug abuse. Her daughter and three grandchildren are living with her now. She does exercise daily at the [facility].      10/30/2022 ALL: Kathryn Hendricks returns for follow up for OSA non compliant with CPAP use, hypersomnia versus narcolepsy (negative HLA but positive MSLT in 2014) and memory loss.   She was continued on Vyvanse 70mg  daily but reports not being able to get it due to cost for the past 7 months. She has been more tired and sleepy. She is now covered with [place] health Medicare and Medicaid.   She was referred to Dr Kieth Brightly for formal neurocognitive testing. She reports not hearing back regarding an appointment. She was approved for social security disability. She reports having three different memory test but unsure of the results. She is trying read more and doing crossword puzzles.   She has resumed CPAP therapy. She has tried using it every night for the past week.     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 She reports still being kept awake by CPAP and is therefor not in compliance.  She also may need attention and neurocognitive testing: CPAP compliance on average is 2 hours and 6 minutes she has used the machine 35 out of 90 days and over 4 hours 28 out of 90 days.  Minimum pressure was 5 maximum pressure of 10 cm of water without EPR if she has the ability to change humidification herself, residual  AHI was 7.2 more central than obstructive apneas remaining 95th percentile pressure was 9.8 cm water air leak at the 95th percentile were 14.6 L/min. Nasal pillows were used. She has claustrophobia.   09/11/2021 ALL: EMRYS Hendricks is a 68 y.o. female here today for follow up for OSA on CPAP and narcolepsy with cataplexy.  HLA testing negative, MSLT positive 2014.   She continues to have difficulty with CPAP compliance. Dr Dohmeier adjusted tubing temperature, reduced humidity and fitted her for new mask at last appt. She reports that she can not tolerate pressure settings. She feels that her nose burns with use.  She does not feeling better and less tired when she uses CPAP. She feels less forgetful when using CPAP all night.   At last visit with Dr Vickey Huger, Adderall was discontinued and modafinil started. It is unclear from notes if she took modafinil or when medication was switched to Vyvanse. She thinks she "had problems with modafinil". She was switched to Vyvanse in 03/2021. Latamara called the office 04/24/2021 reporting Vyvanse 20mg  not effective. She had increased to 40mg  and had not noted any improvement. Dose was increased to 70mg . She feels that it may be working better than Adderall. She reports that some days are better than others. She gets very tired during the day. She is no longer working due to shoulder injury and not as active. She falls asleep if she sits for prolonged periods of time.   She was advised to follow up with PCP for request to have formal attention deficit testing. She has not yet reached out. She feels that she is more withdrawn and not wanting to be around people. She has lost some family members around Christmas and feels more depressed. No SI. She reports symptoms seem to be improving with time. She continues to worry about her memory. She has more trouble remembering how to spell words that she feels should be easy to spell, ie. girl or succeed.   She lives alone. She manages her home and medications independently. She is able to perform ADLs independently. She drives without difficulty. No concerns of falling asleep while driving. She reports having a difficult time in school. She was in special ed classes. Learning has always been difficult for her. Her mother was diagnosed with dementia.     HISTORY: (copied from Dr Dohmeier's previous note)  03-08-2021:  Kathryn Hendricks reports that she has difficulties with her rotator cuff and biceps tendon she had a surgical repair but has not healed as she would like.  So she has been out of work for the last 6 months.  Her Epworth  Sleepiness Scale has remained highly elevated at 17 out of 24 points and her explanation was that she cannot use her CPAP reliably.  Apparently it feels to her as if there is much too much pressure suddenly imposed on her.  So she has not been using it recently and it is waking her up.  The DME most telling her to bring in her CPAP for sleep for adjustments.  The patient reports a degree of sleepiness that may not all be up to obstructive sleep apnea but to her underlying condition of persistent hypersomnia.  The patient was diagnosed in November 2020 with sleep apnea it was a very mild sleep apnea with an AHI of 7.3/h the REM AHI was 19.4 and her sleep appeared fragmented snoring was present but oxygen saturation was stable throughout the night.  I have ordered an auto  titration CPAP between 5 and 12 cmH2O with 1 cm EPR and we had discussed alternatives I wonder if she may want to try a dental device given that her apnea is REM dependent.     Given her very high Epworth sleepiness scores and the difficulties with CPAP use it may be worse while reducing her AHI is somewhat rather than not treating it at all.   REVIEW OF SYSTEMS: Out of a complete 14 system review of symptoms, the patient complains only of the following symptoms, memory loss, daytime sleepiness, inattention, and all other reviewed systems are negative.  ESS: 19/24, previously 10/24 FSS: 60/63  ALLERGIES: Allergies  Allergen Reactions   Penicillins Itching, Shortness Of Breath, Swelling and Other (See Comments)    Has patient had a PCN reaction causing immediate rash, facial/tongue/throat swelling, SOB or lightheadedness with hypotension: Yes  Has patient had a PCN reaction causing severe rash involving mucus membranes or skin necrosis: No  Has patient had a PCN reaction that required hospitalization: No  Has patient had a PCN reaction occurring within the last 10 years: No  If all of the above answers are "NO", then may proceed  with Cephalosporin use.   Penicillin G Other (See Comments)    HOME MEDICATIONS: Outpatient Medications Prior to Visit  Medication Sig Dispense Refill   acetaminophen (TYLENOL) 500 MG tablet Take 500 mg by mouth 2 (two) times daily as needed.     albuterol (VENTOLIN HFA) 108 (90 Base) MCG/ACT inhaler Inhale 2 puffs into the lungs every 6 (six) hours as needed for wheezing or shortness of breath. 18 g 1   Blood Pressure Monitoring (OMRON 3 SERIES BP MONITOR) DEVI See admin instructions.     fluticasone (FLONASE) 50 MCG/ACT nasal spray Place 1-2 sprays into both nostrils daily. 16 g 11   fluticasone (FLOVENT HFA) 110 MCG/ACT inhaler Inhale 2 puffs into the lungs 2 (two) times daily as needed. 12 g 2   ipratropium (ATROVENT) 0.03 % nasal spray Place 2 sprays into both nostrils 4 (four) times daily as needed for rhinitis 30 mL 3   lisinopril (ZESTRIL) 30 MG tablet Take 1 tablet (30 mg total) by mouth at bedtime. 90 tablet 1   montelukast (SINGULAIR) 10 MG tablet Take 1 tablet (10 mg total) by mouth at bedtime. 30 tablet 11   rosuvastatin (CRESTOR) 10 MG tablet Take 1 tablet (10 mg total) by mouth daily. 90 tablet 0   solifenacin (VESICARE) 5 MG tablet Take 1 tablet (5 mg total) by mouth daily. 90 tablet 3   lisdexamfetamine (VYVANSE) 70 MG capsule Take 1 capsule (70 mg total) by mouth daily. 30 capsule 0   furosemide (LASIX) 20 MG tablet Take 1 tablet (20 mg total) by mouth daily as needed (shortness of breath or puffy ankles). 90 tablet 1   No facility-administered medications prior to visit.    PAST MEDICAL HISTORY: Past Medical History:  Diagnosis Date   Abdominal pain, left lower quadrant 11/08/2015   Abnormal Pap smear 2011   Anxiety    Arthritis    lower back and shoulders   Bronchitis    hx - used abluterol inhaler   Chronic diastolic heart failure (HCC)    Cognitive decline    FIBROIDS, UTERUS 07/02/2009   Qualifier: Diagnosis of  By: Ta MD, Cat     GERD (gastroesophageal  reflux disease)    occasional   Hyperlipidemia    Hypersomnia, persistent 11/05/2017   Hypertension    Left  shoulder pain 02/14/2016   Memory loss 01/30/2022   Narcolepsy    Osteopenia 04/2019   T score -1.7 FRAX 3.4% / 0.2%   Premature atrial contractions    PVC's (premature ventricular contractions)    Seasonal allergies    SVD (spontaneous vaginal delivery)    x 3   Weight gain 01/19/2021    PAST SURGICAL HISTORY: Past Surgical History:  Procedure Laterality Date   ANTERIOR AND POSTERIOR REPAIR N/A 02/19/2017   Procedure: ANTERIOR (CYSTOCELE);  Surgeon: Alfredo Martinez, MD;  Location: [facility];  Service: Urology;  Laterality: N/A;   APPENDECTOMY     COLONOSCOPY     CYSTOSCOPY N/A 02/19/2017   Procedure: CYSTOSCOPY;  Surgeon: Alfredo Martinez, MD;  Location: [facility];  Service: Urology;  Laterality: N/A;   DILATION AND CURETTAGE OF UTERUS  2010   HYSTEROSCOPY W/ ENDOMETRIAL ABLATION  2010   SALPINGOOPHORECTOMY Bilateral 02/19/2017   Procedure: SALPINGO OOPHORECTOMY;  Surgeon: Dara Lords, MD;  Location: [facility];  Service: Gynecology;  Laterality: Bilateral;   TUBAL LIGATION     VAGINAL HYSTERECTOMY N/A 02/19/2017   Procedure: HYSTERECTOMY VAGINAL ,  BSO.;  Surgeon: Dara Lords, MD;  Location: [facility];  Service: Gynecology;  Laterality: N/A;  DO NOT OPEN LAP INSTRUMENTS   WISDOM TOOTH EXTRACTION      FAMILY HISTORY: Family History  Problem Relation Age of Onset   Hypertension Mother    Diabetes Mother    Stroke Mother    CAD Mother 74   Narcolepsy Mother        not diagnosed   Diabetes Father    Heart disease Father    Hypertension Sister    Cancer Sister        Colon cancer   Hypertension Brother    Cancer Brother        throat   Dementia Maternal Grandfather     SOCIAL HISTORY: Social History   Socioeconomic History   Marital status: Divorced    Spouse name: Not on file   Number of children: 3   Years of education: 12   Highest education level:  Not on file  Occupational History   Occupation: Equities trader: [employer]  Tobacco Use   Smoking status: Never    Passive exposure: Never   Smokeless tobacco: Never  Vaping Use   Vaping status: Never Used  Substance and Sexual Activity   Alcohol use: No    Alcohol/week: 0.0 standard drinks of alcohol   Drug use: No   Sexual activity: Never    Birth control/protection: Post-menopausal, Surgical    Comment: 1st intercourse 45 yo-5 partners h/o STI  Other Topics Concern   Not on file  Social History Narrative   Caffeine 1 cup coffee in am.   Pt lives alone    Retired    Social Drivers of Corporate investment banker Strain: Not on file  Food Insecurity: No Food Insecurity (01/22/2023)   Hunger Vital Sign    Worried About Running Out of Food in the Last Year: Never true    Ran Out of Food in the Last Year: Never true  Transportation Needs: No Transportation Needs (01/22/2023)   PRAPARE - Administrator, Civil Service (Medical): No    Lack of Transportation (Non-Medical): No  Physical Activity: Not on file  Stress: Not on file  Social Connections: Moderately Integrated (01/22/2023)   Social Connection and Isolation Panel [NHANES]    Frequency of  Communication with Friends and Family: More than three times a week    Frequency of Social Gatherings with Friends and Family: More than three times a week    Attends Religious Services: More than 4 times per year    Active Member of [employer] or Organizations: Yes    Attends Banker Meetings: More than 4 times per year    Marital Status: Divorced  Intimate Partner Violence: Not At Risk (01/22/2023)   Humiliation, Afraid, Rape, and Kick questionnaire    Fear of Current or Ex-Partner: No    Emotionally Abused: No    Physically Abused: No    Sexually Abused: No     PHYSICAL EXAM  Vitals:   11/28/23 1415  BP: 135/74  Pulse: 66  Weight: 150 lb (68 kg)  Height: 5\' 4"  (1.626 m)      Body mass  index is 25.75 kg/m.  Generalized: Well developed, in no acute distress  Cardiology: normal rate and rhythm, no murmur noted Respiratory: clear to auscultation bilaterally  Neurological examination  Mentation: Alert oriented to time, place, history taking. Follows all commands speech and language fluent Cranial nerve II-XII: Pupils were equal round reactive to light. Extraocular movements were full, visual field were full  Motor: The motor testing reveals 5 over 5 strength of all 4 extremities. Good symmetric motor tone is noted throughout.  Gait and station: Gait is normal.    DIAGNOSTIC DATA (LABS, IMAGING, TESTING) - I reviewed patient records, labs, notes, testing and imaging myself where available.     11/28/2023    2:16 PM 10/30/2022    2:40 PM 09/11/2021    1:14 PM  MMSE - Mini Mental State Exam  Orientation to time 5 5 5   Orientation to Place 5 5 5   Registration 3 3 3   Attention/ Calculation 0 0 0  Recall 3 2 2   Language- name 2 objects 2 2 2   Language- repeat 0 0 0  Language- follow 3 step command 3 3 2   Language- read & follow direction 1 1 1   Write a sentence 0 0 1  Copy design 0 0 0  Total score 22 21 21      Lab Results  Component Value Date   WBC 16.8 (H) 04/15/2023   HGB 11.8 (L) 04/15/2023   HCT 37.8 04/15/2023   MCV 87.9 04/15/2023   PLT 348 04/15/2023      Component Value Date/Time   NA 141 04/15/2023 1311   NA 144 01/30/2022 1044   K 4.1 04/15/2023 1311   CL 106 04/15/2023 1311   CO2 27 04/15/2023 1311   GLUCOSE 99 04/15/2023 1311   BUN 13 04/15/2023 1311   BUN 10 01/30/2022 1044   CREATININE 0.67 04/15/2023 1311   CREATININE 0.68 04/27/2016 1558   CALCIUM 9.7 04/15/2023 1311   PROT 7.1 04/15/2023 1311   PROT 6.6 02/23/2020 1123   ALBUMIN 4.3 04/15/2023 1311   ALBUMIN 4.3 02/23/2020 1123   AST 19 04/15/2023 1311   ALT 23 04/15/2023 1311   ALKPHOS 85 04/15/2023 1311   BILITOT 0.4 04/15/2023 1311   BILITOT 0.3 02/23/2020 1123   GFRNONAA >60  04/15/2023 1311   GFRNONAA >89 09/10/2013 1136   GFRAA 108 02/23/2020 1123   GFRAA >89 09/10/2013 1136   Lab Results  Component Value Date   CHOL 132 01/30/2022   HDL 44 01/30/2022   LDLCALC 71 01/30/2022   LDLDIRECT 136 (H) 08/09/2008   TRIG 87 01/30/2022   CHOLHDL  3.0 01/30/2022   Lab Results  Component Value Date   HGBA1C 5.9 04/23/2023   Lab Results  Component Value Date   VITAMINB12 650 01/30/2022   Lab Results  Component Value Date   TSH 1.260 01/30/2022     ASSESSMENT AND PLAN 68 y.o. year old female  has a past medical history of Abdominal pain, left lower quadrant (11/08/2015), Abnormal Pap smear (2011), Anxiety, Arthritis, Bronchitis, Chronic diastolic heart failure (HCC), Cognitive decline, FIBROIDS, UTERUS (07/02/2009), GERD (gastroesophageal reflux disease), Hyperlipidemia, Hypersomnia, persistent (11/05/2017), Hypertension, Left shoulder pain (02/14/2016), Memory loss (01/30/2022), Narcolepsy, Osteopenia (04/2019), Premature atrial contractions, PVC's (premature ventricular contractions), Seasonal allergies, SVD (spontaneous vaginal delivery), and Weight gain (01/19/2021). here with     ICD-10-CM   1. Mild cognitive impairment with memory loss  G31.84     2. OSA on CPAP  G47.33 For home use only DME continuous positive airway pressure (CPAP)      Raynesha P Clendenen continues to improve with CPAP compliance. Compliance report reveals sub optimal compliance, however, I am hopeful that she will be able to rest better. AHI is 9 at current autopap settings. I will increase max pressure to 13cmH20. She was encouraged to continue using CPAP nightly and for greater than 4 hours each night. We will update supply orders as indicated. Risks of untreated sleep apnea review and education materials provided. She will continue Vyvanse 70mg  daily. Memory stable. May consider additional testing at next visit as I am hopeful stress levels will improve. She will monitor mood closely and follow  up with PCP for any concerns of anxiety or depression. Healthy lifestyle habits encouraged. She will follow up with me in 6 months. She verbalizes understanding and agreement with this plan.    Orders Placed This Encounter  Procedures   For home use only DME continuous positive airway pressure (CPAP)    Please increase max pressure to 13cmH20. Heated Humidity with all supplies as needed    Length of Need:   Lifetime    Patient has OSA or probable OSA:   Yes    Is the patient currently using CPAP in the home:   Yes    Settings:   Other see comments    CPAP supplies needed:   Mask, headgear, cushions, filters, heated tubing and water chamber     Meds ordered this encounter  Medications   lisdexamfetamine (VYVANSE) 70 MG capsule    Sig: Take 1 capsule (70 mg total) by mouth daily.    Dispense:  30 capsule    Refill:  0    Supervising Provider:   Anson Fret [id number]     I spent 30 minutes of face-to-face and non-face-to-face time with patient.  This included previsit chart review, lab review, study review, order entry, electronic health record documentation, patient education.   Shawnie Dapper, FNP-C 11/28/2023, 2:58 PM [facility] [address] [place], [place] [address] [phone]

## 2023-11-28 NOTE — Progress Notes (Signed)
 Marland Kitchen

## 2023-11-28 NOTE — Patient Instructions (Signed)
 Below is our plan:  We will continue Vyvanse 70mg  daily. Please try to use CPAP every night for at least 4 hours. I will adjust pressure settings and check report in 45 days.   Please make sure you are staying well hydrated. I recommend 50-60 ounces daily. Well balanced diet and regular exercise encouraged. Consistent sleep schedule with 6-8 hours recommended.   Please continue follow up with care team as directed.   Follow up with me in 6 months  You may receive a survey regarding today's visit. I encourage you to leave honest feed back as I do use this information to improve patient care. Thank you for seeing me today!   Management of Memory Problems   There are some general things you can do to help manage your memory problems.  Your memory may not in fact recover, but by using techniques and strategies you will be able to manage your memory difficulties better.   1)  Establish a routine. Try to establish and then stick to a regular routine.  By doing this, you will get used to what to expect and you will reduce the need to rely on your memory.  Also, try to do things at the same time of day, such as taking your medication or checking your calendar first thing in the morning. Think about think that you can do as a part of a regular routine and make a list.  Then enter them into a daily planner to remind you.  This will help you establish a routine.   2)  Organize your environment. Organize your environment so that it is uncluttered.  Decrease visual stimulation.  Place everyday items such as keys or cell phone in the same place every day (ie.  Basket next to front door) Use post it notes with a brief message to yourself (ie. Turn off light, lock the door) Use labels to indicate where things go (ie. Which cupboards are for food, dishes, etc.) Keep a notepad and pen by the telephone to take messages   3)  Memory Aids A diary or journal/notebook/daily planner Making a list (shopping list,  chore list, to do list that needs to be done) Using an alarm as a reminder (kitchen timer or cell phone alarm) Using cell phone to store information (Notes, Calendar, Reminders) Calendar/White board placed in a prominent position Post-it notes   In order for memory aids to be useful, you need to have good habits.  It's no good remembering to make a note in your journal if you don't remember to look in it.  Try setting aside a certain time of day to look in journal.   4)  Improving mood and managing fatigue. There may be other factors that contribute to memory difficulties.  Factors, such as anxiety, depression and tiredness can affect memory. Regular gentle exercise can help improve your mood and give you more energy. Exercise: there are short videos created by the [employer] on Health specially for older adults: https://bit.ly/2I30q97.  Mediterranean diet: which emphasizes fruits, vegetables, whole grains, legumes, fish, and other seafood; unsaturated fats such as olive oils; and low amounts of red meat, eggs, and sweets. A variation of this, called MIND (Mediterranean-DASH Intervention for Neurodegenerative Delay) incorporates the DASH (Dietary Approaches to Stop Hypertension) diet, which has been shown to lower high blood pressure, a risk factor for Alzheimer's disease. More information at: ExitMarketing.de.  Aerobic exercise that improve heart health is also good for the mind.  [employer]  on Aging have short videos for exercises that you can do at home: BlindWorkshop.com.pt Simple relaxation techniques may help relieve symptoms of anxiety Try to get back to completing activities or hobbies you enjoyed doing in the past. Learn to pace yourself through activities to decrease fatigue. Find out about some local support groups where you can share experiences with others. Try and achieve 7-8 hours of sleep  at night.   Tasks to improve attention/working memory 1. Good sleep hygiene (7-8 hrs of sleep) 2. Learning a new skill (Painting, Carpentry, Pottery, new language, Knitting). 3.Cognitive exercises (keep a daily journal, Puzzles) 4. Physical exercise and training  (30 min/day X 4 days week) 5. Being on Antidepressant if needed 6.Yoga, Meditation, Tai Chi 7. Decrease alcohol intake 8.Have a clear schedule and structure in daily routine   MIND Diet: The Mediterranean-DASH Diet Intervention for Neurodegenerative Delay, or MIND diet, targets the health of the aging brain. Research participants with the highest MIND diet scores had a significantly slower rate of cognitive decline compared with those with the lowest scores. The effects of the MIND diet on cognition showed greater effects than either the Mediterranean or the DASH diet alone.   The healthy items the MIND diet guidelines suggest include:   3+ servings a day of whole grains 1+ servings a day of vegetables (other than green leafy) 6+ servings a week of green leafy vegetables 5+ servings a week of nuts 4+ meals a week of beans 2+ servings a week of berries 2+ meals a week of poultry 1+ meals a week of fish Mainly olive oil if added fat is used   The unhealthy items, which are higher in saturated and trans fat, include: Less than 5 servings a week of pastries and sweets Less than 4 servings a week of red meat (including beef, pork, lamb, and products made from these meats) Less than one serving a week of cheese and fried foods Less than 1 tablespoon a day of butter/stick margarine

## 2023-12-02 ENCOUNTER — Other Ambulatory Visit (HOSPITAL_COMMUNITY): Payer: Self-pay

## 2024-01-06 ENCOUNTER — Other Ambulatory Visit (HOSPITAL_COMMUNITY): Payer: Self-pay

## 2024-01-06 ENCOUNTER — Other Ambulatory Visit: Payer: Self-pay | Admitting: Family Medicine

## 2024-01-06 MED ORDER — LISDEXAMFETAMINE DIMESYLATE 70 MG PO CAPS
70.0000 mg | ORAL_CAPSULE | Freq: Every day | ORAL | 0 refills | Status: DC
Start: 2024-01-06 — End: 2024-01-24
  Filled 2024-01-06: qty 30, 30d supply, fill #0

## 2024-01-06 NOTE — Telephone Encounter (Signed)
 Last seen 11/28/23, next appt scheduled 06/25/24 Dispenses   Dispensed Days Supply Quantity Provider Pharmacy  lisdexamfetamine (VYVANSE ) 70 MG capsule 12/02/2023 30 30 capsule Lomax, Amy, NP [place] [facility]  lisdexamfetamine (VYVANSE ) 70 MG capsule 09/17/2023 30 30 capsule Glory Larsen, MD [place] [facility]  lisdexamfetamine (VYVANSE ) 70 MG capsule 07/17/2023 30 30 capsule Lomax, Amy, NP [place] [facility]  lisdexamfetamine (VYVANSE ) 70 MG capsule 05/27/2023 30 30 capsule Lomax, Amy, NP [place] [facility]  lisdexamfetamine (VYVANSE ) 70 MG capsule 04/12/2023 30 30 capsule Athar, Saima, MD [place] [facility]  lisdexamfetamine (VYVANSE ) 70 MG capsule 02/20/2023 30 30 capsule Lomax, Amy, NP  - [facility]Aaron Aas

## 2024-01-07 ENCOUNTER — Ambulatory Visit: Admitting: Family Medicine

## 2024-01-07 ENCOUNTER — Other Ambulatory Visit (HOSPITAL_COMMUNITY): Payer: Self-pay

## 2024-01-07 ENCOUNTER — Encounter: Payer: Self-pay | Admitting: Family Medicine

## 2024-01-07 ENCOUNTER — Ambulatory Visit: Payer: Self-pay | Admitting: Family Medicine

## 2024-01-07 VITALS — BP 125/73 | HR 66 | Ht 64.0 in | Wt 154.2 lb

## 2024-01-07 DIAGNOSIS — R319 Hematuria, unspecified: Secondary | ICD-10-CM

## 2024-01-07 DIAGNOSIS — E7800 Pure hypercholesterolemia, unspecified: Secondary | ICD-10-CM | POA: Diagnosis not present

## 2024-01-07 DIAGNOSIS — I5032 Chronic diastolic (congestive) heart failure: Secondary | ICD-10-CM

## 2024-01-07 DIAGNOSIS — E785 Hyperlipidemia, unspecified: Secondary | ICD-10-CM | POA: Diagnosis not present

## 2024-01-07 DIAGNOSIS — R7309 Other abnormal glucose: Secondary | ICD-10-CM | POA: Diagnosis not present

## 2024-01-07 DIAGNOSIS — R7303 Prediabetes: Secondary | ICD-10-CM | POA: Diagnosis not present

## 2024-01-07 DIAGNOSIS — I10 Essential (primary) hypertension: Secondary | ICD-10-CM

## 2024-01-07 DIAGNOSIS — F4321 Adjustment disorder with depressed mood: Secondary | ICD-10-CM

## 2024-01-07 DIAGNOSIS — G4733 Obstructive sleep apnea (adult) (pediatric): Secondary | ICD-10-CM | POA: Diagnosis not present

## 2024-01-07 LAB — POCT GLYCOSYLATED HEMOGLOBIN (HGB A1C): HbA1c, POC (prediabetic range): 6.1 % (ref 5.7–6.4)

## 2024-01-07 LAB — POCT URINALYSIS DIP (MANUAL ENTRY)
Bilirubin, UA: NEGATIVE
Glucose, UA: NEGATIVE mg/dL
Ketones, POC UA: NEGATIVE mg/dL
Leukocytes, UA: NEGATIVE
Nitrite, UA: NEGATIVE
Protein Ur, POC: NEGATIVE mg/dL
Spec Grav, UA: 1.01 (ref 1.010–1.025)
Urobilinogen, UA: 0.2 U/dL
pH, UA: 6.5 (ref 5.0–8.0)

## 2024-01-07 LAB — POCT UA - MICROSCOPIC ONLY
Epithelial cells, urine per micros: NONE SEEN
WBC, Ur, HPF, POC: NONE SEEN (ref 0–5)

## 2024-01-07 NOTE — Assessment & Plan Note (Addendum)
 Repeat UA Will contact soon with result

## 2024-01-07 NOTE — Assessment & Plan Note (Addendum)
·   Stable   Monitor for now

## 2024-01-07 NOTE — Assessment & Plan Note (Addendum)
 Bmet and A1C checked I will contact her soon with her results

## 2024-01-07 NOTE — Telephone Encounter (Signed)
 Result discussed A1C 6.1 - continue diet control and exercise UA shows trace blood with 0 - 2 RBCs. As discussed, the findings are generally considered to be within a normal range for microscopic hematuria eval. Return precautions discussed. She agreed with the plan

## 2024-01-07 NOTE — Assessment & Plan Note (Addendum)
 FLP checked today No medication adjustment needed for now

## 2024-01-07 NOTE — Assessment & Plan Note (Addendum)
 Bmet checked today No medication adjustment needed for now

## 2024-01-07 NOTE — Assessment & Plan Note (Addendum)
 I advised her to call the number on the back of her Card for [facility] referral for grief counseling She gave me er [employer] card and I was able to help her locate the number to call I also placed referral to [employer]  - which ever comes first She was appreciative of this

## 2024-01-07 NOTE — Progress Notes (Signed)
    SUBJECTIVE:   CHIEF COMPLAINT / HPI:   HTN/HLD: She is compliant with her Lisinopril  30 mg QD and Crestor  10 mg QD. No concerns. Here for f/u.  CHF: Had a spell on SOB on exertion a few weeks ago. She has been fine since then. No chest pain or leg swelling.   Hematuria: No change in her urine color. Here for follow up from last visit with me.    PreDM: She stated that someone from [facility] advised her that her glucose is abnormal. She cut back on high sugar diet.   Grief: Her son Armando Lance on November 08, 2023. He was her only son. She has two daughters alive. It's been tough for her to handle. She prays to God and receives support from [employer].   PERTINENT  PMH / PSH: PMHx reviewed  OBJECTIVE:   BP 125/73   Pulse 66   Ht 5\' 4"  (1.626 m)   Wt 154 lb 3.2 oz (69.9 kg)   SpO2 97%   BMI 26.47 kg/m   Physical Exam Vitals and nursing note reviewed.  Cardiovascular:     Rate and Rhythm: Normal rate and regular rhythm.     Heart sounds: Normal heart sounds.  Pulmonary:     Effort: Pulmonary effort is normal. No respiratory distress.     Breath sounds: Normal breath sounds. No wheezing.  Abdominal:     General: Abdomen is flat. Bowel sounds are normal. There is no distension.     Palpations: Abdomen is soft. There is no mass.  Musculoskeletal:     Right lower leg: No edema.     Left lower leg: No edema.  Psychiatric:        Mood and Affect: Affect is tearful.        Speech: Speech normal.        Behavior: Behavior normal.        Thought Content: Thought content normal. Thought content does not include homicidal or suicidal ideation.        Cognition and Memory: Cognition normal.      ASSESSMENT/PLAN:   Assessment & Plan Hematuria, unspecified type Repeat UA Will contact soon with result Prediabetes Bmet and A1C checked I will contact her soon with her results HYPERCHOLESTEROLEMIA FLP checked today No medication adjustment needed for now HYPERTENSION,  BENIGN Bmet checked today No medication adjustment needed for now Chronic diastolic congestive heart failure (HCC) Stable Monitor for now Grief I advised her to call the number on the back of her Card for [facility] referral for grief counseling She gave me er [employer] card and I was able to help her locate the number to call I also placed referral to [employer]  - which ever comes first She was appreciative of this     Penni Bowman, MD [facility]

## 2024-01-08 ENCOUNTER — Ambulatory Visit: Admitting: Obstetrics and Gynecology

## 2024-01-08 ENCOUNTER — Encounter: Payer: Self-pay | Admitting: Obstetrics and Gynecology

## 2024-01-08 ENCOUNTER — Other Ambulatory Visit (HOSPITAL_COMMUNITY): Payer: Self-pay

## 2024-01-08 VITALS — BP 124/68 | HR 71 | Ht 64.0 in | Wt 152.6 lb

## 2024-01-08 DIAGNOSIS — M8580 Other specified disorders of bone density and structure, unspecified site: Secondary | ICD-10-CM | POA: Diagnosis not present

## 2024-01-08 DIAGNOSIS — N3281 Overactive bladder: Secondary | ICD-10-CM

## 2024-01-08 DIAGNOSIS — Z9189 Other specified personal risk factors, not elsewhere classified: Secondary | ICD-10-CM | POA: Diagnosis not present

## 2024-01-08 DIAGNOSIS — Z90710 Acquired absence of both cervix and uterus: Secondary | ICD-10-CM | POA: Diagnosis not present

## 2024-01-08 DIAGNOSIS — Z8742 Personal history of other diseases of the female genital tract: Secondary | ICD-10-CM | POA: Diagnosis not present

## 2024-01-08 DIAGNOSIS — Z13820 Encounter for screening for osteoporosis: Secondary | ICD-10-CM

## 2024-01-08 DIAGNOSIS — Z1331 Encounter for screening for depression: Secondary | ICD-10-CM | POA: Diagnosis not present

## 2024-01-08 DIAGNOSIS — Z01419 Encounter for gynecological examination (general) (routine) without abnormal findings: Secondary | ICD-10-CM | POA: Insufficient documentation

## 2024-01-08 LAB — BASIC METABOLIC PANEL WITH GFR
BUN/Creatinine Ratio: 12 (ref 12–28)
BUN: 9 mg/dL (ref 8–27)
CO2: 24 mmol/L (ref 20–29)
Calcium: 9.2 mg/dL (ref 8.7–10.3)
Chloride: 102 mmol/L (ref 96–106)
Creatinine, Ser: 0.78 mg/dL (ref 0.57–1.00)
Glucose: 69 mg/dL — ABNORMAL LOW (ref 70–99)
Potassium: 4.5 mmol/L (ref 3.5–5.2)
Sodium: 142 mmol/L (ref 134–144)
eGFR: 84 mL/min/{1.73_m2} (ref >59)

## 2024-01-08 LAB — LIPID PANEL
Chol/HDL Ratio: 3.1 ratio (ref 0.0–4.4)
Cholesterol, Total: 140 mg/dL (ref 100–199)
HDL: 45 mg/dL (ref >39)
LDL Chol Calc (NIH): 74 mg/dL (ref 0–99)
Triglycerides: 114 mg/dL (ref 0–149)
VLDL Cholesterol Cal: 21 mg/dL (ref 5–40)

## 2024-01-08 MED ORDER — SOLIFENACIN SUCCINATE 10 MG PO TABS
10.0000 mg | ORAL_TABLET | Freq: Every day | ORAL | 3 refills | Status: AC
Start: 2024-01-08 — End: ?
  Filled 2024-01-08: qty 90, 90d supply, fill #0
  Filled 2024-01-24 – 2024-04-09 (×2): qty 90, 90d supply, fill #1
  Filled 2024-07-13: qty 90, 90d supply, fill #2

## 2024-01-08 NOTE — Assessment & Plan Note (Signed)
 Cervical cancer screening performed according to ASCCP guidelines. Encouraged annual mammogram screening Colonoscopy UTD DXA due Labs and immunizations with her primary Encouraged safe sexual practices as indicated Encouraged healthy lifestyle practices with diet and exercise For patients under 50-66yo, I recommend 1200mg  calcium daily and 600IU of vitamin D daily.

## 2024-01-08 NOTE — Assessment & Plan Note (Signed)
 Continue vitamin D+Calcium Encouraged weight based exercise DXA due

## 2024-01-08 NOTE — Patient Instructions (Signed)

## 2024-01-08 NOTE — Progress Notes (Signed)
 68 y.o. [id number] postmenopausal female status post TVH, BSO, anterior posterior repair in 2018, OAB, osteopenia here for annual exam- high risk medicare. Divorced.  Recently lost her son in March 2025.  She has 2 daughters.  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 Appropriately tearful during discussion PCP completed mood screening and  referred to counseling yesterday  Postmenopausal bleeding: none Pelvic discharge or pain: none Breast mass, nipple discharge or skin changes : none Last PAP: No results found for: "DIAGPAP", "HPVHIGH", "ADEQPAP" Last mammogram: 04/08/23 BI-RADS 1, density B Last DXA: 12/27/21 osteopenia, normal FRAX Last colonoscopy: 06/10/20 Sexually active: no  Exercising: no Smoker:no  Flowsheet Row Office Visit [facility]08/2024 [facility]  PHQ-2 Total Score 3       Flowsheet Row Office Visit from [facility]/07/2024 in [facility]  PHQ-9 Total Score 11       GYN HISTORY: No sig hx  OB History  Gravida Para Term Preterm AB Living  5 3 2 1 2 3   SAB IAB Ectopic Multiple Live Births   2       # Outcome Date GA Lbr Len/2nd Weight Sex Type Anes PTL Lv  5 IAB           4 IAB           3 Preterm           2 Term           1 Term             Past Medical History:  Diagnosis Date   Abdominal pain, left lower quadrant 11/08/2015   Abnormal Pap smear 2011   Anxiety    Arthritis    lower back and shoulders   Bronchitis    hx - used abluterol inhaler   Chronic diastolic heart failure (HCC)    Cognitive decline    FIBROIDS, UTERUS 07/02/2009   Qualifier: Diagnosis of  By: Merritt Ables MD, Cat     GERD (gastroesophageal reflux disease)    occasional   Hyperlipidemia    Hypersomnia, persistent 11/05/2017   Hypertension    Left shoulder pain 02/14/2016   Memory loss 01/30/2022   Narcolepsy    Osteopenia 04/2019   T  score -1.7 FRAX 3.4% / 0.2%   Premature atrial contractions    PVC's (premature ventricular contractions)    Seasonal allergies    SVD (spontaneous vaginal delivery)    x 3   Weight gain 01/19/2021    Past Surgical History:  Procedure Laterality Date   ANTERIOR AND POSTERIOR REPAIR N/A 02/19/2017   Procedure: ANTERIOR (CYSTOCELE);  Surgeon: Erman Hayward, MD;  Location: [facility];  Service: Urology;  Laterality: N/A;   APPENDECTOMY     COLONOSCOPY     CYSTOSCOPY N/A 02/19/2017   Procedure: CYSTOSCOPY;  Surgeon: Erman Hayward, MD;  Location: [facility];  Service: Urology;  Laterality: N/A;   DILATION AND CURETTAGE OF UTERUS  2010   HYSTEROSCOPY W/ ENDOMETRIAL ABLATION  2010   SALPINGOOPHORECTOMY Bilateral 02/19/2017   Procedure: SALPINGO OOPHORECTOMY;  Surgeon: Lacretia Piccolo, MD;  Location: [facility];  Service: Gynecology;  Laterality: Bilateral;   TUBAL LIGATION     VAGINAL HYSTERECTOMY N/A 02/19/2017   Procedure: HYSTERECTOMY VAGINAL ,  BSO.;  Surgeon: Lacretia Piccolo, MD;  Location: [facility];  Service:  Gynecology;  Laterality: N/A;  DO NOT OPEN LAP INSTRUMENTS   WISDOM TOOTH EXTRACTION      Current Outpatient Medications on File Prior to Visit  Medication Sig Dispense Refill   acetaminophen  (TYLENOL ) 500 MG tablet Take 500 mg by mouth 2 (two) times daily as needed.     albuterol  (VENTOLIN  HFA) 108 (90 Base) MCG/ACT inhaler Inhale 2 puffs into the lungs every 6 (six) hours as needed for wheezing or shortness of breath. 18 g 1   Blood Pressure Monitoring (OMRON 3 SERIES BP MONITOR) DEVI See admin instructions.     fluticasone  (FLONASE ) 50 MCG/ACT nasal spray Place 1-2 sprays into both nostrils daily. 16 g 11   fluticasone  (FLOVENT  HFA) 110 MCG/ACT inhaler Inhale 2 puffs into the lungs 2 (two) times daily as needed. 12 g 2   ipratropium (ATROVENT ) 0.03 % nasal spray Place 2 sprays into both nostrils 4 (four) times daily as needed for rhinitis 30 mL 3   lisdexamfetamine (VYVANSE ) 70  MG capsule Take 1 capsule (70 mg total) by mouth daily. 30 capsule 0   lisinopril  (ZESTRIL ) 30 MG tablet Take 1 tablet (30 mg total) by mouth at bedtime. 90 tablet 1   montelukast  (SINGULAIR ) 10 MG tablet Take 1 tablet (10 mg total) by mouth at bedtime. 30 tablet 11   rosuvastatin  (CRESTOR ) 10 MG tablet Take 1 tablet (10 mg total) by mouth daily. 90 tablet 0   furosemide  (LASIX ) 20 MG tablet Take 1 tablet (20 mg total) by mouth daily as needed (shortness of breath or puffy ankles). 90 tablet 1   No current facility-administered medications on file prior to visit.    Social History   Socioeconomic History   Marital status: Divorced    Spouse name: Not on file   Number of children: 3   Years of education: 12   Highest education level: Not on file  Occupational History   Occupation: Equities trader: [employer]  Tobacco Use   Smoking status: Never    Passive exposure: Never   Smokeless tobacco: Never  Vaping Use   Vaping status: Never Used  Substance and Sexual Activity   Alcohol use: No    Alcohol/week: 0.0 standard drinks of alcohol   Drug use: No   Sexual activity: Not Currently    Birth control/protection: Post-menopausal, Surgical    Comment: 1st intercourse 13 yo-5 partners h/o STI  Other Topics Concern   Not on file  Social History Narrative   Caffeine 1 cup coffee in am.   Pt lives alone    Retired    Social Drivers of Corporate investment banker Strain: Not on file  Food Insecurity: No Food Insecurity (01/22/2023)   Hunger Vital Sign    Worried About Running Out of Food in the Last Year: Never true    Ran Out of Food in the Last Year: Never true  Transportation Needs: No Transportation Needs (01/22/2023)   PRAPARE - Administrator, Civil Service (Medical): No    Lack of Transportation (Non-Medical): No  Physical Activity: Not on file  Stress: Not on file  Social Connections: Moderately Integrated (01/22/2023)   Social Connection and  Isolation Panel [NHANES]    Frequency of Communication with Friends and Family: More than three times a week    Frequency of Social Gatherings with Friends and Family: More than three times a week    Attends Religious Services: More than 4 times per year  Active Member of Clubs or Organizations: Yes    Attends Banker Meetings: More than 4 times per year    Marital Status: Divorced  Intimate Partner Violence: Not At Risk (01/22/2023)   Humiliation, Afraid, Rape, and Kick questionnaire    Fear of Current or Ex-Partner: No    Emotionally Abused: No    Physically Abused: No    Sexually Abused: No    Family History  Problem Relation Age of Onset   Hypertension Mother    Diabetes Mother    Stroke Mother    CAD Mother 72   Narcolepsy Mother        not diagnosed   Diabetes Father    Heart disease Father    Hypertension Sister    Cancer Sister        Colon cancer   Hypertension Brother    Cancer Brother        throat   Dementia Maternal Grandfather     Allergies  Allergen Reactions   Penicillins Itching, Shortness Of Breath, Swelling and Other (See Comments)    Has patient had a PCN reaction causing immediate rash, facial/tongue/throat swelling, SOB or lightheadedness with hypotension: Yes  Has patient had a PCN reaction causing severe rash involving mucus membranes or skin necrosis: No  Has patient had a PCN reaction that required hospitalization: No  Has patient had a PCN reaction occurring within the last 10 years: No  If all of the above answers are "NO", then may proceed with Cephalosporin use.   Penicillin G Other (See Comments)      PE Today's Vitals   01/08/24 1113  BP: 124/68  Pulse: 71  SpO2: 99%  Weight: 152 lb 9.6 oz (69.2 kg)  Height: 5\' 4"  (1.626 m)   Body mass index is 26.19 kg/m.  Physical Exam Vitals reviewed. Exam conducted with a chaperone present.  Constitutional:      General: She is not in acute distress.    Appearance:  Normal appearance.  HENT:     Head: Normocephalic and atraumatic.     Nose: Nose normal.  Eyes:     Extraocular Movements: Extraocular movements intact.     Conjunctiva/sclera: Conjunctivae normal.  Neck:     Thyroid : No thyroid  mass, thyromegaly or thyroid  tenderness.  Pulmonary:     Effort: Pulmonary effort is normal.  Chest:     Chest wall: No mass or tenderness.  Breasts:    Right: Normal. No swelling, mass, nipple discharge, skin change or tenderness.     Left: Normal. No swelling, mass, nipple discharge, skin change or tenderness.  Abdominal:     General: There is no distension.     Palpations: Abdomen is soft.     Tenderness: There is no abdominal tenderness.  Genitourinary:    General: Normal vulva.     Exam position: Lithotomy position.     Urethra: No prolapse.     Vagina: Normal. No vaginal discharge or bleeding.     Uterus: Absent.      Adnexa: Right adnexa normal and left adnexa normal.     Comments: Uterus and cervix absent Kegel 2/5 Musculoskeletal:        General: Normal range of motion.     Cervical back: Normal range of motion.  Lymphadenopathy:     Upper Body:     Right upper body: No axillary adenopathy.     Left upper body: No axillary adenopathy.     Lower Body: No right inguinal  adenopathy. No left inguinal adenopathy.  Skin:    General: Skin is warm and dry.  Neurological:     General: No focal deficit present.     Mental Status: She is alert.  Psychiatric:        Mood and Affect: Mood normal.        Behavior: Behavior normal.       Assessment and Plan:        Encounter for breast and pelvic examination Assessment & Plan: Cervical cancer screening performed according to ASCCP guidelines. Encouraged annual mammogram screening Colonoscopy UTD DXA due Labs and immunizations with her primary Encouraged safe sexual practices as indicated Encouraged healthy lifestyle practices with diet and exercise For patients under 50-70yo, I recommend  1200mg  calcium  daily and 600IU of vitamin D daily.    Other specified personal risk factors, not elsewhere classified  Osteopenia, unspecified location Assessment & Plan: Continue vitamin D+Calcium  Encouraged weight based exercise DXA due   Orders: -     DG Bone Density; Future  S/P total hysterectomy  Screening for osteoporosis -     DG Bone Density; Future  OAB (overactive bladder) -     Solifenacin  Succinate; Take 1 tablet (10 mg total) by mouth daily.  Dispense: 90 tablet; Refill: 3 -     Ambulatory referral to Physical Therapy Will increase vesicare  dosing. Refer to PFPT  Positive depression screening Due to recent loss of son and work client. PCP completed mood screening and  referred to counseling yesterday Patient tearful but appropriate today Planning to schedule grief counseling   Romaine Closs, MD

## 2024-01-09 ENCOUNTER — Other Ambulatory Visit (HOSPITAL_COMMUNITY): Payer: Self-pay

## 2024-01-17 ENCOUNTER — Telehealth: Payer: Self-pay

## 2024-01-17 NOTE — Telephone Encounter (Signed)
 Pt LVM in triage line stating that she is going to have to cancel appt scheduled on 5/29 @ [facility] for DXA scan due to no transportation and requesting an alternate location.   Spoke w/ the pt and she said that the [facility] on [address] is closer to her home and would not need transportation, said that she could walk there from home.   Order written up and placed on provider's desk for authorization. Advised the pt that she would have to reach out to [facility] location to cancel appt there.

## 2024-01-21 NOTE — Telephone Encounter (Signed)
Order faxed to [facility] 

## 2024-01-23 ENCOUNTER — Other Ambulatory Visit (HOSPITAL_BASED_OUTPATIENT_CLINIC_OR_DEPARTMENT_OTHER)

## 2024-01-24 ENCOUNTER — Other Ambulatory Visit: Payer: Self-pay | Admitting: Family Medicine

## 2024-01-24 ENCOUNTER — Other Ambulatory Visit (HOSPITAL_COMMUNITY): Payer: Self-pay

## 2024-01-24 ENCOUNTER — Other Ambulatory Visit: Payer: Self-pay | Admitting: Cardiovascular Disease

## 2024-01-24 MED ORDER — ROSUVASTATIN CALCIUM 10 MG PO TABS
10.0000 mg | ORAL_TABLET | Freq: Every day | ORAL | 0 refills | Status: DC
  Filled 2024-01-24: qty 90, 90d supply, fill #0

## 2024-01-24 MED ORDER — LISINOPRIL 30 MG PO TABS
30.0000 mg | ORAL_TABLET | Freq: Every day | ORAL | 1 refills | Status: DC
  Filled 2024-01-24 – 2024-03-03 (×2): qty 90, 90d supply, fill #0
  Filled 2024-05-29: qty 90, 90d supply, fill #1

## 2024-01-25 ENCOUNTER — Other Ambulatory Visit (HOSPITAL_COMMUNITY): Payer: Self-pay

## 2024-01-27 ENCOUNTER — Other Ambulatory Visit (HOSPITAL_COMMUNITY): Payer: Self-pay

## 2024-01-28 ENCOUNTER — Other Ambulatory Visit (HOSPITAL_COMMUNITY): Payer: Self-pay

## 2024-01-28 MED ORDER — LISDEXAMFETAMINE DIMESYLATE 70 MG PO CAPS
70.0000 mg | ORAL_CAPSULE | Freq: Every day | ORAL | 0 refills | Status: DC
  Filled 2024-01-28 – 2024-03-03 (×2): qty 30, 30d supply, fill #0

## 2024-01-28 NOTE — Telephone Encounter (Signed)
 Last seen on 11/28/23 Follow up scheduled on 06/25/24   Dispensed Days Supply Quantity Provider Pharmacy  lisdexamfetamine (VYVANSE ) 70 MG capsule 01/09/2024 30 30 capsule Lomax, Amy, NP [place] [facility]      Rx pending to be signed

## 2024-01-29 ENCOUNTER — Other Ambulatory Visit (HOSPITAL_COMMUNITY): Payer: Self-pay

## 2024-02-04 ENCOUNTER — Ambulatory Visit (INDEPENDENT_AMBULATORY_CARE_PROVIDER_SITE_OTHER): Admitting: Family Medicine

## 2024-02-04 ENCOUNTER — Encounter: Payer: Self-pay | Admitting: Family Medicine

## 2024-02-04 VITALS — BP 118/76 | HR 77 | Ht 64.0 in | Wt 151.2 lb

## 2024-02-04 DIAGNOSIS — Z1231 Encounter for screening mammogram for malignant neoplasm of breast: Secondary | ICD-10-CM | POA: Diagnosis not present

## 2024-02-04 DIAGNOSIS — F4321 Adjustment disorder with depressed mood: Secondary | ICD-10-CM

## 2024-02-04 DIAGNOSIS — Z0000 Encounter for general adult medical examination without abnormal findings: Secondary | ICD-10-CM | POA: Diagnosis not present

## 2024-02-04 NOTE — Progress Notes (Signed)
 Annual Wellness Visit     Patient: Kathryn Hendricks, Female    DOB: 07-09-1956, 68 y.o.   MRN: [id number]  Subjective  No chief complaint on file.   Kathryn Hendricks is a 68 y.o. female who presents today for her Annual Wellness Visit. She reports consuming a low sodium diet. The patient does not participate in regular exercise at present. She generally feels fairly well. She reports sleeping well. She does have additional problems to discuss today. Still grieving the loss of her son. No SI or HI. She has not been able to connect with a psychologist through her insurance.   HPI  Vision:Not within last year  and she has an appointment with her ophthalmologist at [facility] next week, Dental: Last dental visit: 2 weeks ago, and STD: Past hx of STI but not been sexually active in more than 3 years.   Past Medical History:  Diagnosis Date   Abdominal pain, left lower quadrant 11/08/2015   Abnormal Pap smear 2011   Anxiety    Arthritis    lower back and shoulders   Bronchitis    hx - used abluterol inhaler   Chronic diastolic heart failure (HCC)    Cognitive decline    FIBROIDS, UTERUS 07/02/2009   Qualifier: Diagnosis of  By: Merritt Ables MD, Cat     GERD (gastroesophageal reflux disease)    occasional   Hyperlipidemia    Hypersomnia, persistent 11/05/2017   Hypertension    Left shoulder pain 02/14/2016   Memory loss 01/30/2022   Narcolepsy    Osteopenia 04/2019   T score -1.7 FRAX 3.4% / 0.2%   Premature atrial contractions    PVC's (premature ventricular contractions)    Seasonal allergies    SVD (spontaneous vaginal delivery)    x 3   Weight gain 01/19/2021      Medications: Outpatient Medications Prior to Visit  Medication Sig   fluticasone  (FLOVENT  HFA) 110 MCG/ACT inhaler Inhale 2 puffs into the lungs 2 (two) times daily as needed.   lisdexamfetamine (VYVANSE ) 70 MG capsule Take 1 capsule (70 mg total) by mouth daily.   lisinopril  (ZESTRIL ) 30 MG tablet Take 1 tablet (30 mg  total) by mouth at bedtime.   montelukast  (SINGULAIR ) 10 MG tablet Take 1 tablet (10 mg total) by mouth at bedtime.   rosuvastatin  (CRESTOR ) 10 MG tablet Take 1 tablet (10 mg total) by mouth daily.   solifenacin  (VESICARE ) 10 MG tablet Take 1 tablet (10 mg total) by mouth daily.   acetaminophen  (TYLENOL ) 500 MG tablet Take 500 mg by mouth 2 (two) times daily as needed. (Patient not taking: Reported on 02/04/2024)   albuterol  (VENTOLIN  HFA) 108 (90 Base) MCG/ACT inhaler Inhale 2 puffs into the lungs every 6 (six) hours as needed for wheezing or shortness of breath. (Patient not taking: Reported on 02/04/2024)   Blood Pressure Monitoring (OMRON 3 SERIES BP MONITOR) DEVI See admin instructions.   fluticasone  (FLONASE ) 50 MCG/ACT nasal spray Place 1-2 sprays into both nostrils daily. (Patient not taking: Reported on 02/04/2024)   furosemide  (LASIX ) 20 MG tablet Take 1 tablet (20 mg total) by mouth daily as needed (shortness of breath or puffy ankles). (Patient not taking: Reported on 02/04/2024)   ipratropium (ATROVENT ) 0.03 % nasal spray Place 2 sprays into both nostrils 4 (four) times daily as needed for rhinitis (Patient not taking: Reported on 02/04/2024)   No facility-administered medications prior to visit.    Allergies  Allergen Reactions  Penicillins Itching, Shortness Of Breath, Swelling and Other (See Comments)    Has patient had a PCN reaction causing immediate rash, facial/tongue/throat swelling, SOB or lightheadedness with hypotension: Yes  Has patient had a PCN reaction causing severe rash involving mucus membranes or skin necrosis: No  Has patient had a PCN reaction that required hospitalization: No  Has patient had a PCN reaction occurring within the last 10 years: No  If all of the above answers are "NO", then may proceed with Cephalosporin use.   Penicillin G Other (See Comments)    Patient Care Team: Arn Lane, MD as PCP - General (Family Medicine)  Review of Systems   All other systems reviewed and are negative.       Objective  BP 118/76   Pulse 77   Ht 5\' 4"  (1.626 m)   Wt 151 lb 4 oz (68.6 kg)   SpO2 98%   BMI 25.96 kg/m  BP Readings from Last 3 Encounters:  02/04/24 118/76  01/08/24 124/68  01/07/24 125/73      Physical Exam Vitals and nursing note reviewed.  HENT:     Head: Normocephalic.     Right Ear: Tympanic membrane and ear canal normal.     Left Ear: Tympanic membrane and ear canal normal.  Eyes:     Conjunctiva/sclera: Conjunctivae normal.  Cardiovascular:     Rate and Rhythm: Normal rate and regular rhythm.     Heart sounds: Normal heart sounds.  Pulmonary:     Effort: Pulmonary effort is normal. No respiratory distress.     Breath sounds: Normal breath sounds. No wheezing or rhonchi.  Abdominal:     General: Abdomen is flat. There is no distension.     Palpations: Abdomen is soft. There is no mass.     Tenderness: There is no abdominal tenderness.  Musculoskeletal:     Right lower leg: No edema.     Left lower leg: No edema.  Neurological:     Mental Status: She is alert and oriented to person, place, and time.  Psychiatric:        Mood and Affect: Mood normal.        Behavior: Behavior normal.        Thought Content: Thought content normal.        Judgment: Judgment normal.       Most recent functional status assessment:    02/04/2024   11:32 AM  In your present state of health, do you have any difficulty performing the following activities:  Hearing? 0  Vision? 1  Comment Ophthalmology appointment scheduled  Difficulty concentrating or making decisions? 1  Walking or climbing stairs? 1  Comment Difficulty climbing stairs. Feels unsteady sometimes  Dressing or bathing? 0  Doing errands, shopping? 0   Most recent fall risk assessment:    02/04/2024   11:31 AM  Fall Risk   Falls in the past year? 0  Number falls in past yr: 0  Injury with Fall? 0  Risk for fall due to : No Fall Risks  Follow  up Falls prevention discussed    Most recent depression screenings:    02/04/2024   11:26 AM 01/08/2024   11:29 AM  PHQ 2/9 Scores  PHQ - 2 Score 2 3  PHQ- 9 Score 5    Most recent cognitive screening:     No data to display         Most recent Audit-C alcohol use screening  02/04/2024   11:30 AM  Alcohol Use Disorder Test (AUDIT)  1. How often do you have a drink containing alcohol? 0  2. How many drinks containing alcohol do you have on a typical day when you are drinking? 0  3. How often do you have six or more drinks on one occasion? 0  AUDIT-C Score 0   A score of 3 or more in women, and 4 or more in men indicates increased risk for alcohol abuse, EXCEPT if all of the points are from question 1   Vision/Hearing Screen: Hearing Screening   250Hz  500Hz  1000Hz  2000Hz  4000Hz   Right ear 20 20 20 20 20   Left ear 20 20 20 20 20    Vision Screening   Right eye Left eye Both eyes  Without correction     With correction 20/40 20/40 20/40     Assessment & Plan   Annual wellness visit done today including the all of the following: Reviewed patient's Family Medical History Reviewed and updated list of patient's medical providers Assessment of cognitive impairment was done Assessed patient's functional ability Established a written schedule for health screening services Health Risk Assessent Completed and Reviewed  Mini-Cog - 02/04/24 1134     Normal clock drawing test? yes    How many words correct? 2              Exercise Activities and Dietary recommendations  Goals      Activity and Exercise Increased     Want to get back into the Gym. Will try to start the following week.   Evidence-based guidance:  Review current exercise levels.  Assess patient perspective on exercise or activity level, barriers to increasing activity, motivation and readiness for change.  Recommend or set healthy exercise goal based on individual tolerance.  Encourage small steps  toward making change in amount of exercise or activity.  Urge reduction of sedentary activities or screen time.  Promote group activities within the community or with family or support person.  Consider referral to rehabiliation therapist for assessment and exercise/activity plan.   Notes:      Weight (lb) < 200 lb (90.7 kg)     Working on exercising and healthy eating. I want to lose some weight. I go to the [employer] at least 3 times weekly.  I walk around the track for a mile, at least 10 laps. I get on treadmills 20 mins and then get on the bike 10 - 15 mins I need to lose 20 lbs in the next few months        Immunization History  Administered Date(s) Administered   Fluad Trivalent(High Dose 65+) 05/27/2023   Influenza Whole 06/28/2007, 07/27/2008, 04/28/2016   Influenza,inj,Quad PF,6+ Mos 05/16/2015, 05/31/2020, 06/06/2021, 06/25/2022   Influenza,inj,quad, With Preservative 07/07/2019, 05/27/2020   Influenza-Unspecified 05/27/2013, 05/17/2014, 04/27/2016, 06/11/2019   Moderna Sars-Covid-2 Vaccination 09/22/2019, 10/09/2019   PFIZER Comirnaty(Gray Top)Covid-19 Tri-Sucrose Vaccine 06/25/2022   PFIZER(Purple Top)SARS-COV-2 Vaccination 08/26/2019, 09/16/2019, 08/18/2020   PNEUMOCOCCAL CONJUGATE-20 01/22/2023   Pfizer Covid-19 Vaccine Bivalent Booster 69yrs & up 06/06/2021   Pfizer(Comirnaty)Fall Seasonal Vaccine 12 years and older 06/25/2022, 08/06/2023   Respiratory Syncytial Virus Vaccine ,Recomb Aduvanted(Arexvy ) 08/02/2023   Td 06/28/2007   Tdap 06/14/2017   Zoster Recombinant(Shingrix) 07/14/2019, 10/14/2019    Health Maintenance  Topic Date Due   COVID-19 Vaccine (9 - 2024-25 season) 02/04/2024   INFLUENZA VACCINE  03/27/2024   Medicare Annual Wellness (AWV)  02/03/2025   MAMMOGRAM  04/07/2025   Colonoscopy  06/10/2025   DTaP/Tdap/Td (3 - Td or Tdap) 06/15/2027   Pneumonia Vaccine 57+ Years old  Completed   DEXA SCAN  Completed   Hepatitis C Screening  Completed    Zoster Vaccines- Shingrix  Completed   HPV VACCINES  Aged Out   Meningococcal B Vaccine  Aged Out     Discussed health benefits of physical activity, and encouraged her to engage in regular exercise appropriate for her age and condition.    Problem List Items Addressed This Visit   None Visit Diagnoses       Encounter for subsequent annual wellness visit in Medicare patient    -  Primary     Encounter for screening mammogram for malignant neoplasm of breast       Relevant Orders   MM 3D SCREENING MAMMOGRAM BILATERAL BREAST     Per the patient, she already has Dexa scan scheduled by her gynecologist She will schedule mammogram appointment Mammogram ordered  Other issue - gait instability No falls PT referral offered but she declined  Mild cognitive impairment Already discussed with her neurologist Advised f/u with them for further work up and management She had normal  TSH, HIV, RPR and B12 in the past   Grief - referral to psychology placed.  Penni Bowman, MD

## 2024-02-04 NOTE — Patient Instructions (Addendum)
 Fall Prevention in the Home, Adult Falls can cause injuries and can happen to people of all ages. There are many things you can do to make your home safer and to help prevent falls. What actions can I take to prevent falls? General information Use good lighting in all rooms. Make sure to: Replace any light bulbs that burn out. Turn on the lights in dark areas and use night-lights. Keep items that you use often in easy-to-reach places. Lower the shelves around your home if needed. Move furniture so that there are clear paths around it. Do not use throw rugs or other things on the floor that can make you trip. If any of your floors are uneven, fix them. Add color or contrast paint or tape to clearly mark and help you see: Grab bars or handrails. First and last steps of staircases. Where the edge of each step is. If you use a ladder or stepladder: Make sure that it is fully opened. Do not climb a closed ladder. Make sure the sides of the ladder are locked in place. Have someone hold the ladder while you use it. Know where your pets are as you move through your home. What can I do in the bathroom?     Keep the floor dry. Clean up any water on the floor right away. Remove soap buildup in the bathtub or shower. Buildup makes bathtubs and showers slippery. Use non-skid mats or decals on the floor of the bathtub or shower. Attach bath mats securely with double-sided, non-slip rug tape. If you need to sit down in the shower, use a non-slip stool. Install grab bars by the toilet and in the bathtub and shower. Do not use towel bars as grab bars. What can I do in the bedroom? Make sure that you have a light by your bed that is easy to reach. Do not use any sheets or blankets on your bed that hang to the floor. Have a firm chair or bench with side arms that you can use for support when you get dressed. What can I do in the kitchen? Clean up any spills right away. If you need to reach something  above you, use a step stool with a grab bar. Keep electrical cords out of the way. Do not use floor polish or wax that makes floors slippery. What can I do with my stairs? Do not leave anything on the stairs. Make sure that you have a light switch at the top and the bottom of the stairs. Make sure that there are handrails on both sides of the stairs. Fix handrails that are broken or loose. Install non-slip stair treads on all your stairs if they do not have carpet. Avoid having throw rugs at the top or bottom of the stairs. Choose a carpet that does not hide the edge of the steps on the stairs. Make sure that the carpet is firmly attached to the stairs. Fix carpet that is loose or worn. What can I do on the outside of my home? Use bright outdoor lighting. Fix the edges of walkways and driveways and fix any cracks. Clear paths of anything that can make you trip, such as tools or rocks. Add color or contrast paint or tape to clearly mark and help you see anything that might make you trip as you walk through a door, such as a raised step or threshold. Trim any bushes or trees on paths to your home. Check to see if handrails are loose  or broken and that both sides of all steps have handrails. Install guardrails along the edges of any raised decks and porches. Have leaves, snow, or ice cleared regularly. Use sand, salt, or ice melter on paths if you live where there is ice and snow during the winter. Clean up any spills in your garage right away. This includes grease or oil spills. What other actions can I take? Review your medicines with your doctor. Some medicines can cause dizziness or changes in blood pressure, which increase your risk of falling. Wear shoes that: Have a low heel. Do not wear high heels. Have rubber bottoms and are closed at the toe. Feel good on your feet and fit well. Use tools that help you move around if needed. These include: Canes. Walkers. Scooters. Crutches. Ask  your doctor what else you can do to help prevent falls. This may include seeing a physical therapist to learn to do exercises to move better and get stronger. Where to find more information Centers for Disease Control and Prevention, [employer]: TonerPromos.no [employer] on Aging: BaseRingTones.pl National Institute on Aging: BaseRingTones.pl Contact a doctor if: You are afraid of falling at home. You feel weak, drowsy, or dizzy at home. You fall at home. Get help right away if you: Lose consciousness or have trouble moving after a fall. Have a fall that causes a head injury. These symptoms may be an emergency. Get help right away. Call 911. Do not wait to see if the symptoms will go away. Do not drive yourself to the hospital. This information is not intended to replace advice given to you by your health care provider. Make sure you discuss any questions you have with your health care provider. Document Revised: 04/16/2022 Document Reviewed: 04/16/2022 Elsevier Patient Education  2024 [employer].

## 2024-02-07 DIAGNOSIS — G4733 Obstructive sleep apnea (adult) (pediatric): Secondary | ICD-10-CM | POA: Diagnosis not present

## 2024-02-10 DIAGNOSIS — M8589 Other specified disorders of bone density and structure, multiple sites: Secondary | ICD-10-CM | POA: Diagnosis not present

## 2024-02-10 DIAGNOSIS — M8588 Other specified disorders of bone density and structure, other site: Secondary | ICD-10-CM | POA: Diagnosis not present

## 2024-02-10 DIAGNOSIS — M85852 Other specified disorders of bone density and structure, left thigh: Secondary | ICD-10-CM | POA: Diagnosis not present

## 2024-02-11 DIAGNOSIS — H524 Presbyopia: Secondary | ICD-10-CM | POA: Diagnosis not present

## 2024-03-03 ENCOUNTER — Other Ambulatory Visit: Payer: Self-pay

## 2024-03-03 ENCOUNTER — Other Ambulatory Visit (HOSPITAL_COMMUNITY): Payer: Self-pay

## 2024-03-08 DIAGNOSIS — G4733 Obstructive sleep apnea (adult) (pediatric): Secondary | ICD-10-CM | POA: Diagnosis not present

## 2024-03-11 ENCOUNTER — Ambulatory Visit

## 2024-04-07 ENCOUNTER — Ambulatory Visit: Admitting: Clinical

## 2024-04-07 DIAGNOSIS — Z634 Disappearance and death of family member: Secondary | ICD-10-CM | POA: Diagnosis not present

## 2024-04-07 DIAGNOSIS — F4321 Adjustment disorder with depressed mood: Secondary | ICD-10-CM | POA: Diagnosis not present
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
## 2024-04-09 ENCOUNTER — Other Ambulatory Visit (HOSPITAL_COMMUNITY): Payer: Self-pay

## 2024-04-09 ENCOUNTER — Ambulatory Visit
Admission: RE | Admit: 2024-04-09 | Discharge: 2024-04-09 | Disposition: A | Discharge status: Home / Self Care | Source: Ambulatory Visit | Attending: Family Medicine | Admitting: Family Medicine

## 2024-04-09 DIAGNOSIS — Z1231 Encounter for screening mammogram for malignant neoplasm of breast: Secondary | ICD-10-CM

## 2024-04-10 DIAGNOSIS — G4733 Obstructive sleep apnea (adult) (pediatric): Secondary | ICD-10-CM | POA: Diagnosis not present

## 2024-04-16 ENCOUNTER — Encounter: Payer: Self-pay | Admitting: Cardiovascular Disease

## 2024-04-16 ENCOUNTER — Ambulatory Visit: Payer: Self-pay | Attending: Cardiovascular Disease | Admitting: Cardiovascular Disease

## 2024-04-16 VITALS — BP 142/78 | HR 66 | Ht 64.0 in | Wt 149.4 lb

## 2024-04-16 DIAGNOSIS — I5032 Chronic diastolic (congestive) heart failure: Secondary | ICD-10-CM

## 2024-04-16 DIAGNOSIS — I10 Essential (primary) hypertension: Secondary | ICD-10-CM

## 2024-04-16 NOTE — Progress Notes (Signed)
 Chief Complaint  Patient presents with   Follow-up    PVCs     History of Present Illness: 68 yo female with history of chronic diastolic CHF, HTN, HLD, GERD, narcolepsy, PVCs, sleep apnea  who is here today for cardiac follow up. She was seen in our office in 2014 for evaluation of palpitations. Echo in 2014 with normal LV size and function, no valve disease. Cardiac monitor in 2014 with sinus with rare PACs and rare PVCs. She had chest pain in 2024. Nuclear stress test in April 2024 with no ischemia. Echo April 2024 with LVEF=65-70%. No significant valve disease.   She is here today for follow up. The patient denies any chest pain, dyspnea, palpitations, lower extremity edema, orthopnea, PND, dizziness, near syncope or syncope.   Primary Care Physician: Anders Otto DASEN, MD  Past Medical History:  Diagnosis Date   Abdominal pain, left lower quadrant 11/08/2015   Abnormal Pap smear 2011   Anxiety    Arthritis    lower back and shoulders   Bronchitis    hx - used abluterol inhaler   Chronic diastolic heart failure (HCC)    Cognitive decline    FIBROIDS, UTERUS 07/02/2009   Qualifier: Diagnosis of  By: Deanna MD, Cat     GERD (gastroesophageal reflux disease)    occasional   Hyperlipidemia    Hypersomnia, persistent 11/05/2017   Hypertension    Left shoulder pain 02/14/2016   Memory loss 01/30/2022   Narcolepsy    Osteopenia 04/2019   T score -1.7 FRAX 3.4% / 0.2%   Premature atrial contractions    PVC's (premature ventricular contractions)    Seasonal allergies    SVD (spontaneous vaginal delivery)    x 3   Weight gain 01/19/2021    Past Surgical History:  Procedure Laterality Date   ANTERIOR AND POSTERIOR REPAIR N/A 02/19/2017   Procedure: ANTERIOR (CYSTOCELE);  Surgeon: Gaston Hamilton, MD;  Location: [facility];  Service: Urology;  Laterality: N/A;   APPENDECTOMY     COLONOSCOPY     CYSTOSCOPY N/A 02/19/2017   Procedure: CYSTOSCOPY;  Surgeon: Gaston Hamilton, MD;   Location: [facility];  Service: Urology;  Laterality: N/A;   DILATION AND CURETTAGE OF UTERUS  2010   HYSTEROSCOPY W/ ENDOMETRIAL ABLATION  2010   SALPINGOOPHORECTOMY Bilateral 02/19/2017   Procedure: SALPINGO OOPHORECTOMY;  Surgeon: Rockney Evalene SQUIBB, MD;  Location: [facility];  Service: Gynecology;  Laterality: Bilateral;   TUBAL LIGATION     VAGINAL HYSTERECTOMY N/A 02/19/2017   Procedure: HYSTERECTOMY VAGINAL ,  BSO.;  Surgeon: Rockney Evalene SQUIBB, MD;  Location: [facility];  Service: Gynecology;  Laterality: N/A;  DO NOT OPEN LAP INSTRUMENTS   WISDOM TOOTH EXTRACTION      Current Outpatient Medications  Medication Sig Dispense Refill   acetaminophen  (TYLENOL ) 500 MG tablet Take 500 mg by mouth 2 (two) times daily as needed.     albuterol  (VENTOLIN  HFA) 108 (90 Base) MCG/ACT inhaler Inhale 2 puffs into the lungs every 6 (six) hours as needed for wheezing or shortness of breath. 18 g 1   Blood Pressure Monitoring (OMRON 3 SERIES BP MONITOR) DEVI See admin instructions.     fluticasone  (FLONASE ) 50 MCG/ACT nasal spray Place 1-2 sprays into both nostrils daily. 16 g 11   fluticasone  (FLOVENT  HFA) 110 MCG/ACT inhaler Inhale 2 puffs into the lungs 2 (two) times daily as needed. 12 g 2   furosemide  (LASIX ) 20 MG tablet Take 1  tablet (20 mg total) by mouth daily as needed (shortness of breath or puffy ankles). 90 tablet 1   ipratropium (ATROVENT ) 0.03 % nasal spray Place 2 sprays into both nostrils 4 (four) times daily as needed for rhinitis 30 mL 3   lisdexamfetamine (VYVANSE ) 70 MG capsule Take 1 capsule (70 mg total) by mouth daily. 30 capsule 0   lisinopril  (ZESTRIL ) 30 MG tablet Take 1 tablet (30 mg total) by mouth at bedtime. 90 tablet 1   montelukast  (SINGULAIR ) 10 MG tablet Take 1 tablet (10 mg total) by mouth at bedtime. 30 tablet 11   rosuvastatin  (CRESTOR ) 10 MG tablet Take 1 tablet (10 mg total) by mouth daily. 90 tablet 0   solifenacin  (VESICARE ) 10 MG tablet Take 1 tablet (10 mg total) by  mouth daily. 90 tablet 3   No current facility-administered medications for this visit.    Allergies  Allergen Reactions   Penicillins Itching, Shortness Of Breath, Swelling and Other (See Comments)    Has patient had a PCN reaction causing immediate rash, facial/tongue/throat swelling, SOB or lightheadedness with hypotension: Yes  Has patient had a PCN reaction causing severe rash involving mucus membranes or skin necrosis: No  Has patient had a PCN reaction that required hospitalization: No  Has patient had a PCN reaction occurring within the last 10 years: No  If all of the above answers are NO, then may proceed with Cephalosporin use.   Penicillin G Other (See Comments)    Social History   Socioeconomic History   Marital status: Divorced    Spouse name: Not on file   Number of children: 3   Years of education: 12   Highest education level: Not on file  Occupational History   Occupation: Equities trader: [employer]  Tobacco Use   Smoking status: Never    Passive exposure: Never   Smokeless tobacco: Never  Vaping Use   Vaping status: Never Used  Substance and Sexual Activity   Alcohol use: No    Alcohol/week: 0.0 standard drinks of alcohol   Drug use: No   Sexual activity: Not Currently    Birth control/protection: Post-menopausal, Surgical    Comment: 1st intercourse 20 yo-5 partners h/o STI  Other Topics Concern   Not on file  Social History Narrative   Caffeine 1 cup coffee in am.   Pt lives alone    Retired    Social Drivers of Corporate investment banker Strain: Not on file  Food Insecurity: No Food Insecurity (02/04/2024)   Hunger Vital Sign    Worried About Running Out of Food in the Last Year: Never true    Ran Out of Food in the Last Year: Never true  Transportation Needs: No Transportation Needs (02/04/2024)   PRAPARE - Administrator, Civil Service (Medical): No    Lack of Transportation (Non-Medical): No  Physical  Activity: Inactive (02/04/2024)   Exercise Vital Sign    Days of Exercise per Week: 0 days    Minutes of Exercise per Session: 0 min  Stress: Not on file  Social Connections: Moderately Integrated (01/22/2023)   Social Connection and Isolation Panel    Frequency of Communication with Friends and Family: More than three times a week    Frequency of Social Gatherings with Friends and Family: More than three times a week    Attends Religious Services: More than 4 times per year    Active Member of [employer] or Organizations:  Yes    Attends Club or Organization Meetings: More than 4 times per year    Marital Status: Divorced  Intimate Partner Violence: Not At Risk (01/22/2023)   Humiliation, Afraid, Rape, and Kick questionnaire    Fear of Current or Ex-Partner: No    Emotionally Abused: No    Physically Abused: No    Sexually Abused: No    Family History  Problem Relation Age of Onset   Hypertension Mother    Diabetes Mother    Stroke Mother    CAD Mother 20   Narcolepsy Mother        not diagnosed   Diabetes Father    Heart disease Father    Hypertension Sister    Cancer Sister        Colon cancer   Hypertension Brother    Cancer Brother        throat   Dementia Maternal Grandfather     Review of Systems:  As stated in the HPI and otherwise negative.   BP (!) 142/78   Pulse 66   Ht 5' 4 (1.626 m)   Wt 149 lb 6.4 oz (67.8 kg)   SpO2 98%   BMI 25.64 kg/m   Physical Examination: General: Well developed, well nourished, NAD  HEENT: OP clear, mucus membranes moist  SKIN: warm, dry. No rashes. Neuro: No focal deficits  Musculoskeletal: Muscle strength 5/5 all ext  Psychiatric: Mood and affect normal  Neck: No JVD, no carotid bruits, no thyromegaly, no lymphadenopathy.  Lungs:Clear bilaterally, no wheezes, rhonci, crackles Cardiovascular: Regular rate and rhythm. No murmurs, gallops or rubs. Abdomen:Soft. Bowel sounds present. Non-tender.  Extremities: No lower extremity  edema. Pulses are 2 + in the bilateral DP/PT.  EKG:  EKG is ordered today. The ekg ordered today demonstrates  EKG Interpretation Date/Time:  Thursday April 16 2024 15:26:09 EDT Ventricular Rate:  67 PR Interval:  130 QRS Duration:  80 QT Interval:  386 QTC Calculation: 407 R Axis:   60  Text Interpretation: Normal sinus rhythm Normal ECG Confirmed by Verlin Bruckner [phone]) on 04/16/2024 3:52:03 PM   Recent Labs: 01/07/2024: BUN 9; Creatinine, Ser 0.78; Potassium 4.5; Sodium 142   Lipid Panel    Component Value Date/Time   CHOL 140 01/07/2024 0955   TRIG 114 01/07/2024 0955   HDL 45 01/07/2024 0955   CHOLHDL 3.1 01/07/2024 0955   CHOLHDL 3.4 06/05/2016 0903   VLDL 19 06/05/2016 0903   LDLCALC 74 01/07/2024 0955   LDLDIRECT 136 (H) 08/09/2008 2048     Wt Readings from Last 3 Encounters:  04/16/24 149 lb 6.4 oz (67.8 kg)  02/04/24 151 lb 4 oz (68.6 kg)  01/08/24 152 lb 9.6 oz (69.2 kg)    Assessment and Plan:   1.  Chronic diastolic CHF: Volume status is ok. Weight is stable. Lasix  as needed.   2.  HTN: BP controlled. Continue current therapy  Labs/ tests ordered today include:   Orders Placed This Encounter  Procedures   EKG 12-Lead   Disposition:   F/U with me in one year   Signed, Bruckner Verlin, MD 04/16/2024 4:07 PM    [facility] [address] [place], [place], [place]  [address] Phone: [phone]; Fax: [phone]

## 2024-04-16 NOTE — Patient Instructions (Signed)
 Medication Instructions:  No changes *If you need a refill on your cardiac medications before your next appointment, please call your pharmacy*  Lab Work: none If you have labs (blood work) drawn today and your tests are completely normal, you will receive your results only by: MyChart Message (if you have MyChart) OR A paper copy in the mail If you have any lab test that is abnormal or we need to change your treatment, we will call you to review the results.  Testing/Procedures: none  Follow-Up: At [facility], you and your health needs are our priority.  As part of our continuing mission to provide you with exceptional heart care, our providers are all part of one team.  This team includes your primary Cardiologist (physician) and Advanced Practice Providers or APPs (Physician Assistants and Nurse Practitioners) who all work together to provide you with the care you need, when you need it.  Your next appointment:   12 month(s)  Provider:   Antoinette Batman, MD

## 2024-04-18 ENCOUNTER — Ambulatory Visit (HOSPITAL_COMMUNITY)
Admission: EM | Admit: 2024-04-18 | Discharge: 2024-04-18 | Disposition: A | Discharge status: Home / Self Care | Attending: Physician Assistant | Admitting: Physician Assistant

## 2024-04-18 ENCOUNTER — Ambulatory Visit (INDEPENDENT_AMBULATORY_CARE_PROVIDER_SITE_OTHER)

## 2024-04-18 ENCOUNTER — Encounter (HOSPITAL_COMMUNITY): Payer: Self-pay

## 2024-04-18 DIAGNOSIS — M19071 Primary osteoarthritis, right ankle and foot: Secondary | ICD-10-CM | POA: Diagnosis not present

## 2024-04-18 DIAGNOSIS — M79671 Pain in right foot: Secondary | ICD-10-CM | POA: Diagnosis not present

## 2024-04-18 DIAGNOSIS — M7731 Calcaneal spur, right foot: Secondary | ICD-10-CM | POA: Diagnosis not present

## 2024-04-18 MED ORDER — PREDNISONE 20 MG PO TABS
40.0000 mg | ORAL_TABLET | Freq: Every day | ORAL | 0 refills | Status: AC
  Filled 2024-04-18 – 2024-05-01 (×2): qty 10, 5d supply, fill #0

## 2024-04-18 NOTE — ED Triage Notes (Addendum)
 Patient reports that she has two Yorkies and they were pulling on her and states she put her right heel down on the step and has had right heel pain x 1 month. Pain is worse with weight bearing.  Patient states she has been taking Tylenol  for pain.

## 2024-04-18 NOTE — ED Provider Notes (Signed)
 [facility]    CSN: [id number] Arrival date & time: 04/18/24  1403      History   Chief Complaint Chief Complaint  Patient presents with   Foot Pain    HPI Kathryn Hendricks is a 68 y.o. female.   Patient presents with right heel pain that started about 2 months ago.  She reports she has 2 small dogs that she was walking when they pulled her and she almost fell and she stepped down really hard on the right heel.  She reports pain with ambulation since that time.  She reports some swelling around the heel and ankle.  She has tried ibuprofen and Tylenol  with minimal relief.  No previous injuries to this foot.  She has seen orthopedics in the past for her shoulder.    Past Medical History:  Diagnosis Date   Abdominal pain, left lower quadrant 11/08/2015   Abnormal Pap smear 2011   Anxiety    Arthritis    lower back and shoulders   Bronchitis    hx - used abluterol inhaler   Chronic diastolic heart failure (HCC)    Cognitive decline    FIBROIDS, UTERUS 07/02/2009   Qualifier: Diagnosis of  By: Ta MD, Cat     GERD (gastroesophageal reflux disease)    occasional   Hyperlipidemia    Hypersomnia, persistent 11/05/2017   Hypertension    Left shoulder pain 02/14/2016   Memory loss 01/30/2022   Narcolepsy    Osteopenia 04/2019   T score -1.7 FRAX 3.4% / 0.2%   Premature atrial contractions    PVC's (premature ventricular contractions)    Seasonal allergies    SVD (spontaneous vaginal delivery)    x 3   Weight gain 01/19/2021    Patient Active Problem List   Diagnosis Date Noted   Encounter for breast and pelvic examination 01/08/2024   Grief 01/07/2024   Abdominal pain 04/23/2023   Hematuria 04/23/2023   Prediabetes 04/23/2023   Asthma 04/23/2023   Breast tenderness 03/15/2023   Fall 03/15/2023   Family history of malignant neoplasm of digestive organs 12/19/2022   Gastroesophageal reflux disease 12/19/2022   Bilateral lower extremity edema 06/08/2022   Right  knee pain 01/30/2022   Mood disturbance 01/30/2022   Sleep apnea 06/06/2021   Chronic left shoulder pain 03/08/2021   Excessive daytime sleepiness 03/08/2021   Attention and concentration deficit 03/08/2021   Overactive bladder 11/25/2020   Impingement syndrome of left shoulder region 02/10/2020   Osteopenia 11/12/2019   Paresthesia 06/30/2019   Narcolepsy and cataplexy 11/05/2017   S/P total hysterectomy 02/19/2017   PVC (premature ventricular contraction) 12/04/2016   Rotator cuff tear 07/10/2016   Biceps tendinitis on left 07/11/2015   Mild cognitive impairment with memory loss 11/09/2014   Attention deficit hyperactivity disorder (ADHD), predominantly inattentive type 11/09/2014   Diastolic CHF (HCC) 06/30/2013   HYPERCHOLESTEROLEMIA 07/13/2009   Overweight 07/13/2009   HYPERTENSION, BENIGN 09/25/2007    Past Surgical History:  Procedure Laterality Date   ANTERIOR AND POSTERIOR REPAIR N/A 02/19/2017   Procedure: ANTERIOR (CYSTOCELE);  Surgeon: Gaston Hamilton, MD;  Location: [facility];  Service: Urology;  Laterality: N/A;   APPENDECTOMY     COLONOSCOPY     CYSTOSCOPY N/A 02/19/2017   Procedure: CYSTOSCOPY;  Surgeon: Gaston Hamilton, MD;  Location: [facility];  Service: Urology;  Laterality: N/A;   DILATION AND CURETTAGE OF UTERUS  2010   HYSTEROSCOPY W/ ENDOMETRIAL ABLATION  2010   SALPINGOOPHORECTOMY Bilateral 02/19/2017  Procedure: SALPINGO OOPHORECTOMY;  Surgeon: Rockney Evalene SQUIBB, MD;  Location: [facility];  Service: Gynecology;  Laterality: Bilateral;   TUBAL LIGATION     VAGINAL HYSTERECTOMY N/A 02/19/2017   Procedure: HYSTERECTOMY VAGINAL ,  BSO.;  Surgeon: Rockney Evalene SQUIBB, MD;  Location: [facility];  Service: Gynecology;  Laterality: N/A;  DO NOT OPEN LAP INSTRUMENTS   WISDOM TOOTH EXTRACTION      OB History     Gravida  5   Para  3   Term  2   Preterm  1   AB  2   Living  3      SAB      IAB  2   Ectopic      Multiple      Live Births                Home Medications    Prior to Admission medications   Medication Sig Start Date End Date Taking? Authorizing Provider  predniSONE  (DELTASONE ) 20 MG tablet Take 2 tablets (40 mg total) by mouth daily for 5 days. 04/18/24 04/23/24 Yes Ward, Harlene PEDLAR, PA-C  acetaminophen  (TYLENOL ) 500 MG tablet Take 500 mg by mouth 2 (two) times daily as needed.    [provider]  albuterol  (VENTOLIN  HFA) 108 (90 Base) MCG/ACT inhaler Inhale 2 puffs into the lungs every 6 (six) hours as needed for wheezing or shortness of breath. 08/14/22   Ganta, Anupa, DO  Blood Pressure Monitoring (OMRON 3 SERIES BP MONITOR) DEVI See admin instructions.    [provider]  fluticasone  (FLONASE ) 50 MCG/ACT nasal spray Place 1-2 sprays into both nostrils daily. 10/19/21   Kara Dorn NOVAK, MD  fluticasone  (FLOVENT  HFA) 110 MCG/ACT inhaler Inhale 2 puffs into the lungs 2 (two) times daily as needed. 09/13/23   Anders Otto DASEN, MD  furosemide  (LASIX ) 20 MG tablet Take 1 tablet (20 mg total) by mouth daily as needed (shortness of breath or puffy ankles). 01/22/23 04/16/24  Anders Otto DASEN, MD  ipratropium (ATROVENT ) 0.03 % nasal spray Place 2 sprays into both nostrils 4 (four) times daily as needed for rhinitis 10/25/23   Kara Dorn NOVAK, MD  lisdexamfetamine (VYVANSE ) 70 MG capsule Take 1 capsule (70 mg total) by mouth daily. 01/28/24   Lomax, Amy, NP  lisinopril  (ZESTRIL ) 30 MG tablet Take 1 tablet (30 mg total) by mouth at bedtime. 01/24/24   Anders Otto DASEN, MD  montelukast  (SINGULAIR ) 10 MG tablet Take 1 tablet (10 mg total) by mouth at bedtime. Patient not taking: Reported on 04/18/2024 10/19/21   Kara Dorn NOVAK, MD  rosuvastatin  (CRESTOR ) 10 MG tablet Take 1 tablet (10 mg total) by mouth daily. 01/24/24   Verlin Lonni BIRCH, MD  solifenacin  (VESICARE ) 10 MG tablet Take 1 tablet (10 mg total) by mouth daily. 01/08/24   Dallie Vera GAILS, MD    Family History Family History  Problem Relation  Age of Onset   Hypertension Mother    Diabetes Mother    Stroke Mother    CAD Mother 37   Narcolepsy Mother        not diagnosed   Diabetes Father    Heart disease Father    Hypertension Sister    Cancer Sister        Colon cancer   Hypertension Brother    Cancer Brother        throat   Dementia Maternal Grandfather     Social History Social History  Tobacco Use   Smoking status: Never    Passive exposure: Never   Smokeless tobacco: Never  Vaping Use   Vaping status: Never Used  Substance Use Topics   Alcohol use: No    Alcohol/week: 0.0 standard drinks of alcohol   Drug use: No     Allergies   Penicillins and Penicillin g   Review of Systems Review of Systems  Constitutional:  Negative for chills and fever.  HENT:  Negative for ear pain and sore throat.   Eyes:  Negative for pain and visual disturbance.  Respiratory:  Negative for cough and shortness of breath.   Cardiovascular:  Negative for chest pain and palpitations.  Gastrointestinal:  Negative for abdominal pain and vomiting.  Genitourinary:  Negative for dysuria and hematuria.  Musculoskeletal:  Positive for arthralgias. Negative for back pain.  Skin:  Negative for color change and rash.  Neurological:  Negative for seizures and syncope.  All other systems reviewed and are negative.    Physical Exam Triage Vital Signs ED Triage Vitals  Encounter Vitals Group     BP 04/18/24 1546 139/70     Girls Systolic BP Percentile --      Girls Diastolic BP Percentile --      Boys Systolic BP Percentile --      Boys Diastolic BP Percentile --      Pulse Rate 04/18/24 1546 63     Resp 04/18/24 1546 16     Temp 04/18/24 1546 98.4 F (36.9 C)     Temp Source 04/18/24 1546 Oral     SpO2 04/18/24 1546 100 %     Weight --      Height --      Head Circumference --      Peak Flow --      Pain Score 04/18/24 1545 6     Pain Loc --      Pain Education --      Exclude from Growth Chart --    No data  found.  Updated Vital Signs BP 139/70 (BP Location: Right Arm)   Pulse 63   Temp 98.4 F (36.9 C) (Oral)   Resp 16   SpO2 100%   Visual Acuity Right Eye Distance:   Left Eye Distance:   Bilateral Distance:    Right Eye Near:   Left Eye Near:    Bilateral Near:     Physical Exam Vitals and nursing note reviewed.  Constitutional:      General: She is not in acute distress.    Appearance: She is well-developed.  HENT:     Head: Normocephalic and atraumatic.  Eyes:     Conjunctiva/sclera: Conjunctivae normal.  Cardiovascular:     Rate and Rhythm: Normal rate and regular rhythm.     Heart sounds: No murmur heard. Pulmonary:     Effort: Pulmonary effort is normal. No respiratory distress.     Breath sounds: Normal breath sounds.  Abdominal:     Palpations: Abdomen is soft.     Tenderness: There is no abdominal tenderness.  Musculoskeletal:        General: No swelling.     Cervical back: Neck supple.       Feet:  Skin:    General: Skin is warm and dry.     Capillary Refill: Capillary refill takes less than 2 seconds.  Neurological:     Mental Status: She is alert.  Psychiatric:        Mood and  Affect: Mood normal.      UC Treatments / Results  Labs (all labs ordered are listed, but only abnormal results are displayed) Labs Reviewed - No data to display  EKG   Radiology DG Foot Complete Right Result Date: 04/18/2024 CLINICAL DATA:  Heel pain, pain with weight-bearing. EXAM: RIGHT FOOT COMPLETE - 3+ VIEW COMPARISON:  None Available. FINDINGS: There is no acute fracture or dislocation. A large calcaneal spurs present. Enthesopathic changes are noted at the insertion site of the Achilles tendon. Degenerative changes are present in the midfoot and first metatarsophalangeal joint. Radiopaque density between the distal second and third metatarsal is seen external to the patient on lateral view. Soft tissues are within normal limits. IMPRESSION: 1. No acute fracture or  dislocation. 2. Calcaneal spurring. Electronically Signed   By: Leita Birmingham M.D.   On: 04/18/2024 17:10    Procedures Procedures (including critical care time)  Medications Ordered in UC Medications - No data to display  Initial Impression / Assessment and Plan / UC Course  I have reviewed the triage vital signs and the nursing notes.  Pertinent labs & imaging results that were available during my care of the patient were reviewed by me and considered in my medical decision making (see chart for details).     Right heel pain, images with no fractures.  Calcaneal spurs noted on imaging. Prednisone  course started. Advised stretching and ice. Advised follow up with ortho if no improvement.  Final Clinical Impressions(s) / UC Diagnoses   Final diagnoses:  Right foot pain     Discharge Instructions      Take prednisone  as prescribed. Recommend gentle stretching. Can apply ice to the area. If no improvement follow-up with orthopedics.   ED Prescriptions     Medication Sig Dispense Auth. Provider   predniSONE  (DELTASONE ) 20 MG tablet Take 2 tablets (40 mg total) by mouth daily for 5 days. 10 tablet Ward, Jalayiah Bibian Z, PA-C      PDMP not reviewed this encounter.   Ward, Harlene PEDLAR, PA-C 04/18/24 [phone]

## 2024-04-18 NOTE — Discharge Instructions (Signed)
 Take prednisone  as prescribed. Recommend gentle stretching. Can apply ice to the area. If no improvement follow-up with orthopedics.

## 2024-04-19 ENCOUNTER — Other Ambulatory Visit (HOSPITAL_COMMUNITY): Payer: Self-pay

## 2024-04-20 ENCOUNTER — Other Ambulatory Visit (HOSPITAL_COMMUNITY): Payer: Self-pay

## 2024-04-21 ENCOUNTER — Ambulatory Visit (INDEPENDENT_AMBULATORY_CARE_PROVIDER_SITE_OTHER): Admitting: Clinical

## 2024-04-21 DIAGNOSIS — F4321 Adjustment disorder with depressed mood: Secondary | ICD-10-CM | POA: Diagnosis not present

## 2024-04-21 DIAGNOSIS — Z634 Disappearance and death of family member: Secondary | ICD-10-CM | POA: Diagnosis not present

## 2024-04-21 NOTE — Progress Notes (Addendum)
 [facility] Counselor/Therapist Progress Note IN PERSON VISIT   Patient ID: Kathryn Hendricks, MRN: [id number],    Date: 04/21/2024  Time Spent: 60 min    Treatment Type: Individual Therapy  Reported Symptoms: Increased stress, sadness thinking about the death of her son  Mental Status Exam: Appearance:  Neat and Well Groomed     Behavior: Appropriate  Motor: Normal  Speech/Language:  Normal Rate  Affect: Appropriate  Mood: anxious and depressed  Thought process: normal  Thought content:   WNL  Sensory/Perceptual disturbances:   WNL  Orientation: oriented to person, place, time/date, and situation  Attention: Fair  Concentration: Fair  Memory: WNL  Fund of knowledge:  Good  Insight:   Good  Judgment:  Good  Impulse Control: Good   Risk Assessment: Danger to Self:  No Self-injurious Behavior: No Danger to Others: No Duty to Warn:no Physical Aggression / Violence:No    Subjective:  [name] presented to be alert and open to talking about her thoughts & feelings.  Interventions: Grief Therapy and Identified strengths & identified coping strategies that she can continue to implement  Patient Response: [name] was able to verbalize typical grief reactions. She also shared other traumatic experiences that has been triggered with this loss.  [name] was able to identify her faith and her support systems that she can rely on.  Although she has not been able to go to the [facility], she reported she's completed pleasant activities with her church friends & oldest daughter.   Diagnosis:  Adjustment disorder with depressed mood  Bereavement   Goals, Assessment & Plan:   Continue with individual psycho therapy to process her loss and grief.  Frequency: bi-weekly  Modality: individual    Individualized Treatment Plan  Strengths: Supportive Relationships, Church, Spirituality, Hopefulness, and Able to Communicate Effectively    Supports: Church  Family & Adult Daughter   Goal/Needs for Treatment:  In order of importance to patient 1) Increase her ability to verbalize her thoughts & feelings around the loss of her son    Client Statement of Needs: To be able to talk about the things she's been keeping inside   Treatment Level:Individual outpatient psycho therapy  Symptoms:Tearful, Sad  Client Treatment Preferences:Every 2 weeks In Person visit   Healthcare consumer's goal for treatment: - Increase her ability to verbalize and process her grief  Healthcare consumer will: Actively participate in therapy, working towards healthy functioning.    *Justification for Continuation/Discontinuation of Goal: R=Revised, O=Ongoing, A=Achieved, D=Discontinued  Goal 1) Increase her ability to verbalize her thoughts & feelings around the loss of her son Target Date Goal Was reviewed Status Code Progress towards goal/Likert rating  07/27/2024                 Electronic signature sign off request for treatment plan sent via MyChart on 04/22/2024  This plan has been reviewed and created by the following participants:  Kathryn Hendricks & this Clinician. This plan will be reviewed at least every 12 months. Date Behavioral Health Clinician Date Guardian/Patient   04/21/2024 Kathryn Hendricks 04/21/2024 Kathryn Hendricks                     Kathryn Hendricks P. Trudy, MSW, LCSW [employer] Therapist Main Office: [phone]

## 2024-04-22 ENCOUNTER — Encounter: Payer: Self-pay | Admitting: Clinical

## 2024-04-28 ENCOUNTER — Other Ambulatory Visit: Payer: Self-pay

## 2024-04-28 ENCOUNTER — Other Ambulatory Visit (HOSPITAL_COMMUNITY): Payer: Self-pay

## 2024-04-28 ENCOUNTER — Other Ambulatory Visit: Payer: Self-pay | Admitting: Family Medicine

## 2024-04-28 ENCOUNTER — Other Ambulatory Visit: Payer: Self-pay | Admitting: Cardiovascular Disease

## 2024-04-28 MED ORDER — ROSUVASTATIN CALCIUM 10 MG PO TABS
10.0000 mg | ORAL_TABLET | Freq: Every day | ORAL | 1 refills | Status: AC
  Filled 2024-04-28 – 2024-05-01 (×2): qty 90, 90d supply, fill #0
  Filled 2024-07-13: qty 90, 90d supply, fill #1

## 2024-04-28 MED ORDER — FUROSEMIDE 20 MG PO TABS
20.0000 mg | ORAL_TABLET | Freq: Every day | ORAL | 0 refills | Status: DC | PRN
  Filled 2024-04-28 – 2024-05-01 (×2): qty 30, 30d supply, fill #0

## 2024-04-29 ENCOUNTER — Other Ambulatory Visit (HOSPITAL_COMMUNITY): Payer: Self-pay

## 2024-04-30 ENCOUNTER — Other Ambulatory Visit (HOSPITAL_COMMUNITY): Payer: Self-pay

## 2024-05-01 ENCOUNTER — Other Ambulatory Visit (HOSPITAL_COMMUNITY): Payer: Self-pay

## 2024-05-12 ENCOUNTER — Ambulatory Visit (INDEPENDENT_AMBULATORY_CARE_PROVIDER_SITE_OTHER): Admitting: Clinical

## 2024-05-12 DIAGNOSIS — F4321 Adjustment disorder with depressed mood: Secondary | ICD-10-CM

## 2024-05-12 DIAGNOSIS — Z634 Disappearance and death of family member: Secondary | ICD-10-CM

## 2024-05-12 NOTE — Progress Notes (Unsigned)
 [facility] Counselor/Therapist Progress Note IN PERSON VISIT   Patient ID: Kathryn Hendricks, MRN: [id number],    Date: 05/12/2024  Time Spent: 60 min    Treatment Type: Individual Therapy  Reported Symptoms: Increased stress, sadness thinking about the death of her son  Mental Status Exam: Appearance:  Neat and Well Groomed     Behavior: Appropriate  Motor: Normal  Speech/Language:  Normal Rate  Affect: Appropriate  Mood: anxious and depressed  Thought process: normal  Thought content:   WNL  Sensory/Perceptual disturbances:   WNL  Orientation: oriented to person, place, time/date, and situation  Attention: Fair  Concentration: Fair  Memory: WNL  Fund of knowledge:  Good  Insight:   Good  Judgment:  Good  Impulse Control: Good   Risk Assessment: Danger to Self:  No Self-injurious Behavior: No Danger to Others: No Duty to Warn:no Physical Aggression / Violence:No    Subjective:  Kathryn Hendricks presented to be alert and open to talking about her thoughts & feelings.  Interventions: Grief Therapy and Identified strengths & identified coping strategies that she can continue to implement  Patient Response: Kathryn Hendricks was able to verbalize typical grief reactions. She also shared other traumatic experiences that has been triggered with this loss.  Kathryn Hendricks was able to identify her faith and her support systems that she can rely on.  Although she has not been able to go to the [facility], she reported she's completed pleasant activities with her church friends & oldest daughter.   Diagnosis:  No diagnosis found.   Goals, Assessment & Plan:   Continue with individual psycho therapy to process her loss and grief.  Frequency: bi-weekly  Modality: individual    Individualized Treatment Plan  Strengths: Supportive Relationships, Church, Spirituality, Hopefulness, and Able to Communicate Effectively    Supports: Church Family & Adult Daughter   Goal/Needs  for Treatment:  In order of importance to patient 1) Increase her ability to verbalize her thoughts & feelings around the loss of her son    Client Statement of Needs: To be able to talk about the things she's been keeping inside   Treatment Level:Individual outpatient psycho therapy  Symptoms:Tearful, Sad  Client Treatment Preferences:Every 2 weeks In Person visit   Healthcare consumer's goal for treatment: - Increase her ability to verbalize and process her grief  Healthcare consumer will: Actively participate in therapy, working towards healthy functioning.    *Justification for Continuation/Discontinuation of Goal: R=Revised, O=Ongoing, A=Achieved, D=Discontinued  Goal 1) Increase her ability to verbalize her thoughts & feelings around the loss of her son Target Date Goal Was reviewed Status Code Progress towards goal/Likert rating  07/27/2024                 Electronic signature sign off request for treatment plan sent via MyChart on 04/22/2024  This plan has been reviewed and created by the following participants:  Estil Pina & this Clinician. This plan will be reviewed at least every 12 months. Date Behavioral Health Clinician Date Guardian/Patient   04/21/2024 Kathryn Hendricks P. Kathryn Hendricks 04/21/2024 Kathryn Hendricks                     Kathryn Hendricks P. Trudy, MSW, LCSW [employer] Therapist Main Office: [phone]                    Kathryn Hendricks Trudy, [place]

## 2024-05-25 ENCOUNTER — Other Ambulatory Visit: Payer: Self-pay | Admitting: Family Medicine

## 2024-05-25 ENCOUNTER — Other Ambulatory Visit (HOSPITAL_COMMUNITY): Payer: Self-pay

## 2024-05-25 MED ORDER — LISDEXAMFETAMINE DIMESYLATE 70 MG PO CAPS
70.0000 mg | ORAL_CAPSULE | Freq: Every day | ORAL | 0 refills | Status: DC
  Filled 2024-05-25: qty 30, 30d supply, fill #0

## 2024-05-25 NOTE — Telephone Encounter (Signed)
 Last filled by patient on 03/03/24 Last office visit : 11/28/23 Next office visit : 06/25/24

## 2024-05-26 ENCOUNTER — Ambulatory Visit: Admitting: Clinical

## 2024-05-29 ENCOUNTER — Other Ambulatory Visit (HOSPITAL_COMMUNITY): Payer: Self-pay

## 2024-06-15 ENCOUNTER — Encounter: Payer: Self-pay | Admitting: Family Medicine

## 2024-06-16 ENCOUNTER — Ambulatory Visit: Admitting: Family Medicine

## 2024-06-16 ENCOUNTER — Encounter: Payer: Self-pay | Admitting: Family Medicine

## 2024-06-16 VITALS — BP 136/75 | HR 63 | Ht 64.0 in | Wt 152.8 lb

## 2024-06-16 DIAGNOSIS — F4321 Adjustment disorder with depressed mood: Secondary | ICD-10-CM | POA: Diagnosis not present

## 2024-06-16 DIAGNOSIS — I5032 Chronic diastolic (congestive) heart failure: Secondary | ICD-10-CM

## 2024-06-16 DIAGNOSIS — Z23 Encounter for immunization: Secondary | ICD-10-CM

## 2024-06-16 DIAGNOSIS — R8290 Unspecified abnormal findings in urine: Secondary | ICD-10-CM

## 2024-06-16 DIAGNOSIS — R2681 Unsteadiness on feet: Secondary | ICD-10-CM | POA: Insufficient documentation

## 2024-06-16 DIAGNOSIS — R319 Hematuria, unspecified: Secondary | ICD-10-CM

## 2024-06-16 LAB — POCT URINALYSIS DIP (MANUAL ENTRY)
Glucose, UA: NEGATIVE mg/dL
Ketones, POC UA: NEGATIVE mg/dL
Leukocytes, UA: NEGATIVE
Nitrite, UA: NEGATIVE
Protein Ur, POC: 30 mg/dL — AB
Spec Grav, UA: 1.025 (ref 1.010–1.025)
Urobilinogen, UA: 0.2 U/dL
pH, UA: 5.5 (ref 5.0–8.0)

## 2024-06-16 NOTE — Assessment & Plan Note (Addendum)
 Intermittent gait instability with no recent falls or weakness. Symptoms present for months. - PT referral discussed but she declined - Monitor symptoms and consider referral to physical therapy if she worsens. - Consider CT scan of the head if no improvement with physical therapy. - Return precautions discussed

## 2024-06-16 NOTE — Assessment & Plan Note (Addendum)
 Stable Hx of heart murmur oer the patient, not present on exam today Heart murmur not detected. Previous cardiology evaluation indicated valve issue, no current concerns. F/U with cardiology as planned

## 2024-06-16 NOTE — Assessment & Plan Note (Addendum)
 Following the death of her son Symptoms improving with counseling. Reports decreased crying and emotional triggers.

## 2024-06-16 NOTE — Assessment & Plan Note (Addendum)
 Hematuria and proteinuria Intermittent hematuria and proteinuria with previous normal urology findings. Current microscopy not performed - lab tech not available. - Schedule follow-up appointment in one week for repeat urinalysis with microscopy. - Encourage increased water  intake. - Refer to urology if repeat urinalysis shows persistent abnormalities.

## 2024-06-16 NOTE — Patient Instructions (Signed)
 Blood in the Pee (Hematuria) in Adults: What to Know  Hematuria is blood in the pee. You may be able to see blood in the pee. In some cases, a health care provider may find blood with a test.  Blood in the pee can be caused by infections of the kidney, bladder, or the urethra. The urethra is the tube that drains pee from the bladder.  Other causes may include: Kidney stones. Infection of the prostate. Cancer. Too much calcium in the pee. Conditions that are passed from parent to child. Too much exercise. Infections can be treated with medicine. A kidney stone will usually leave your body when you pee. If infections or kidney stones didn't cause the blood in the urine, then more tests may be needed. It is very important to tell your provider about any blood in your pee, even if you have no pain or the blood stops with no treatment. Blood in the pee can be a sign of a very serious problem, such as cancer. Follow these instructions at home: Medicines Take your medicines only as told. If you were given antibiotics, take them as told. Do not stop taking them even if you start to feel better. Eating and drinking Drink more fluids as told. Aim to drink 3-4 quarts (2.8-3.8 L) a day. Avoid caffeine, tea, and carbonated drinks. These can bother the bladder. Avoid alcohol if a female because it may irritate the prostate. General instructions If you have been diagnosed with a kidney stone, strain your pee to catch the stone if told by your provider. Empty your bladder often. Avoid holding pee for a long time. If you're female, make sure that: You wipe from front to back after using the bathroom. You use each piece of toilet paper only once. You pee before and after sex. It's up to you to get the results of any tests. Ask when your results will be ready and how to get them. You may need to call or meet with your provider to get your results. Keep all follow-up visits. Your provider will need to know  about any changes or any new symptoms. Contact a health care provider if: Your symptoms don't get better after 3 days. Your symptoms get worse. You have back pain or belly pain. You have a fever or chills. You throw up or feel like you may throw up. You throw up every time you take medicine. Get help right away if: You pass blood clots in your pee. You pass out. These symptoms may be an emergency. Call 911 right away. Do not wait to see if the symptoms will go away. Do not drive yourself to the hospital. This information is not intended to replace advice given to you by your health care provider. Make sure you discuss any questions you have with your health care provider. Document Revised: 05/30/2023 Document Reviewed: 05/09/2023 Elsevier Patient Education  2024 [employer].

## 2024-06-16 NOTE — Progress Notes (Signed)
    SUBJECTIVE:   CHIEF COMPLAINT / HPI:   Discussed the use of AI scribe software for clinical note transcription with the patient, who gave verbal consent to proceed.  History of Present Illness   Kathryn Hendricks is a 68 year old female who presents with concerns about dark urine and possible dehydration.  She is concerned about her urine, noting that it appears dark. She has a history of hematuria. Her water  intake is about one and a half bottles per day, which she attributes to a dislike of water 's taste, though she is attempting to add lemon to improve it. No dysuria, abdominal pain, nausea, or vomiting.  She has experienced balance issues for the past couple of months, describing episodes where she feels like she might lose her balance and needs to grab onto something. These episodes occur intermittently, with the last incident happening about a week ago. No recent falls and no weakness in her arms or legs.  Her medication regimen includes Tylenol , Albuterol , Flovent , Lasix , Atrovent  nasal spray, Vyvanse  70 mg daily, Lisinopril  30 mg daily, Singulair  10 mg daily, Crestor  10 mg daily, and VESIcare  10 mg daily, with some medications taken as needed.  She has been receiving the COVID vaccine annually but reports becoming very sick for a month each time, experiencing symptoms such as cough and fever. She has decided not to receive the COVID vaccine this year.  She mentions a heart murmur diagnosed by her cardiologist, who indicated that one of her heart valves does not close completely. She was informed last year and is monitored annually. She reports occasional ankle swelling but no current swelling.       PERTINENT  PMH / PSH: PMHx reviewed  OBJECTIVE:   BP 136/75   Pulse 63   Ht 5' 4 (1.626 m)   Wt 152 lb 12.8 oz (69.3 kg)   SpO2 99%   BMI 26.23 kg/m   Physical Exam   GEN: No distress CHEST: Lungs clear to auscultation bilaterally, no wheezing. CARDIOVASCULAR: Heart regular  rate and rhythm, no murmurs. ABDOMEN: Abdomen non-distended, normal bowel sounds, non-tender. EXT: No edema       ASSESSMENT/PLAN:   Assessment & Plan Hematuria, unspecified type Hematuria and proteinuria Intermittent hematuria and proteinuria with previous normal urology findings. Current microscopy not performed - lab tech not available. - Schedule follow-up appointment in one week for repeat urinalysis with microscopy. - Encourage increased water  intake. - Refer to urology if repeat urinalysis shows persistent abnormalities. Grief Following the death of her son Symptoms improving with counseling. Reports decreased crying and emotional triggers. Gait instability Intermittent gait instability with no recent falls or weakness. Symptoms present for months. - PT referral discussed but she declined - Monitor symptoms and consider referral to physical therapy if she worsens. - Consider CT scan of the head if no improvement with physical therapy. - Return precautions discussed Chronic diastolic congestive heart failure (HCC) Stable Hx of heart murmur oer the patient, not present on exam today Heart murmur not detected. Previous cardiology evaluation indicated valve issue, no current concerns. F/U with cardiology as planned   General Health Maintenance Receiving flu vaccination today. Declines COVID-19 vaccination this year due to previous adverse reactions. - Administer flu vaccine. - Postpone COVID-19 vaccination to next year.  Otto Fairly, MD [facility]

## 2024-06-23 NOTE — Progress Notes (Unsigned)
 PATIENT: Kathryn Hendricks DOB: 07-08-1956  REASON FOR VISIT: follow up HISTORY FROM: patient  No chief complaint on file.    HISTORY OF PRESENT ILLNESS:  06/23/24 ALL:  Kathryn Hendricks returns for follow up for OSA on CPAP, hypersomnia and memory loss.   11/28/2023 ALL:  Kathryn Hendricks returns for follow up for OSA on CPAP, hypersomnia and memory loss.   She reports doing better on CPAP therapy. She is trying to use therapy every night. She admits that she continues to fall asleep prior to starting therapy. There have been nights where she stayed up all night watching after her son. He recently overdosed and passed away. Kathryn Hendricks was last week. She denies concerns with machine or supplies.   She continues Vyvanse  70mg  daily. Last filled 04/11/2023. She does feel it works most days. Memory seems fairly stable. She does have times when she forgets what she is doing or where she is going but she is able to figure it out fairly quickly. She now lives with a roommate. She does exercise daily at the [facility].     05/07/2023 ALL:  Kathryn Hendricks returns for follow up for OSA on CPAP, hypersomnia and memory loss.   She reports doing better on CPAP therapy. She is trying to use therapy every night. She admits that she continues to fall asleep prior to starting therapy. She denies concerns with machine or supplies.   She continues Vyvanse  70mg  daily. Last filled 04/11/2023. She does feel it works most days. Memory seems fairly stable. She has had more days, recently, where she feels exhausted. She is under more stress as her son was recently asked to leave her home d/t drug abuse. Her daughter and three grandchildren are living with her now. She does exercise daily at the [facility].     10/30/2022 ALL: Kathryn Hendricks returns for follow up for OSA non compliant with CPAP use, hypersomnia versus narcolepsy (negative HLA but positive MSLT in 2014) and memory loss.   She was continued on Vyvanse  70mg  daily but reports not being able to  get it due to cost for the past 7 months. She has been more tired and sleepy. She is now covered with [employer] and Medicaid.   She was referred to Dr Corina for formal neurocognitive testing. She reports not hearing back regarding an appointment. She was approved for social security disability. She reports having three different memory test but unsure of the results. She is trying read more and doing crossword puzzles.   She has resumed CPAP therapy. She has tried using it every night for the past week.     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 She reports still being kept awake by CPAP and is therefor not in compliance.  She also may need attention and neurocognitive testing: CPAP compliance on average is 2 hours and 6 minutes she has used the machine 35 out of 90 days and over 4 hours 28 out of 90 days.  Minimum pressure was 5 maximum pressure of 10 cm of water  without EPR if she has the ability to change humidification herself, residual AHI was 7.2 more central than obstructive apneas remaining 95th percentile pressure was 9.8 cm water  air leak at the 95th percentile were 14.6 L/min. Nasal pillows were used. She has claustrophobia.   09/11/2021 ALL: Kathryn Hendricks is a 68 y.o. female here today for follow up for OSA  on CPAP and narcolepsy with cataplexy.  HLA testing negative, MSLT positive 2014.   She continues to have difficulty with CPAP compliance. Dr Dohmeier adjusted tubing temperature, reduced humidity and fitted her for new mask at last appt. She reports that she can not tolerate pressure settings. She feels that her nose burns with use. She does not feeling better and less tired when she uses CPAP. She feels less forgetful when using CPAP all night.   At last visit with Dr Chalice, Adderall was discontinued and modafinil  started. It is unclear from notes if she took modafinil  or  when medication was switched to Vyvanse . She thinks she had problems with modafinil . She was switched to Vyvanse  in 03/2021. Montasia called the office 04/24/2021 reporting Vyvanse  20mg  not effective. She had increased to 40mg  and had not noted any improvement. Dose was increased to 70mg . She feels that it may be working better than Adderall. She reports that some days are better than others. She gets very tired during the day. She is no longer working due to shoulder injury and not as active. She falls asleep if she sits for prolonged periods of time.   She was advised to follow up with PCP for request to have formal attention deficit testing. She has not yet reached out. She feels that she is more withdrawn and not wanting to be around people. She has lost some family members around Christmas and feels more depressed. No SI. She reports symptoms seem to be improving with time. She continues to worry about her memory. She has more trouble remembering how to spell words that she feels should be easy to spell, ie. girl or succeed.   She lives alone. She manages her home and medications independently. She is able to perform ADLs independently. She drives without difficulty. No concerns of falling asleep while driving. She reports having a difficult time in school. She was in special ed classes. Learning has always been difficult for her. Her mother was diagnosed with dementia.     HISTORY: (copied from Dr Dohmeier's previous note)  03-08-2021:  Mrs. Marut reports that she has difficulties with her rotator cuff and biceps tendon she had a surgical repair but has not healed as she would like.  So she has been out of work for the last 6 months.  Her Epworth Sleepiness Scale has remained highly elevated at 17 out of 24 points and her explanation was that she cannot use her CPAP reliably.  Apparently it feels to her as if there is much too much pressure suddenly imposed on her.  So she has not been using it  recently and it is waking her up.  The DME most telling her to bring in her CPAP for sleep for adjustments.  The patient reports a degree of sleepiness that may not all be up to obstructive sleep apnea but to her underlying condition of persistent hypersomnia.  The patient was diagnosed in November 2020 with sleep apnea it was a very mild sleep apnea with an AHI of 7.3/h the REM AHI was 19.4 and her sleep appeared fragmented snoring was present but oxygen saturation was stable throughout the night.  I have ordered an auto titration CPAP between 5 and 12 cmH2O with 1 cm EPR and we had discussed alternatives I wonder if she may want to try a dental device given that her apnea is REM dependent.     Given her very high Epworth sleepiness scores and the difficulties with CPAP use  it may be worse while reducing her AHI is somewhat rather than not treating it at all.   REVIEW OF SYSTEMS: Out of a complete 14 system review of symptoms, the patient complains only of the following symptoms, memory loss, daytime sleepiness, inattention, and all other reviewed systems are negative.  ESS: 19/24, previously 10/24 FSS: 60/63  ALLERGIES: Allergies  Allergen Reactions   Penicillins Itching, Shortness Of Breath, Swelling and Other (See Comments)    Has patient had a PCN reaction causing immediate rash, facial/tongue/throat swelling, SOB or lightheadedness with hypotension: Yes  Has patient had a PCN reaction causing severe rash involving mucus membranes or skin necrosis: No  Has patient had a PCN reaction that required hospitalization: No  Has patient had a PCN reaction occurring within the last 10 years: No  If all of the above answers are NO, then may proceed with Cephalosporin use.   Penicillin G Other (See Comments)    HOME MEDICATIONS: Outpatient Medications Prior to Visit  Medication Sig Dispense Refill   acetaminophen  (TYLENOL ) 500 MG tablet Take 500 mg by mouth 2 (two) times daily as needed.      albuterol  (VENTOLIN  HFA) 108 (90 Base) MCG/ACT inhaler Inhale 2 puffs into the lungs every 6 (six) hours as needed for wheezing or shortness of breath. 18 g 1   Blood Pressure Monitoring (OMRON 3 SERIES BP MONITOR) DEVI See admin instructions.     fluticasone  (FLONASE ) 50 MCG/ACT nasal spray Place 1-2 sprays into both nostrils daily. 16 g 11   fluticasone  (FLOVENT  HFA) 110 MCG/ACT inhaler Inhale 2 puffs into the lungs 2 (two) times daily as needed. 12 g 2   furosemide  (LASIX ) 20 MG tablet Take 1 tablet (20 mg total) by mouth daily as needed (shortness of breath or puffy ankles). 30 tablet 0   ipratropium (ATROVENT ) 0.03 % nasal spray Place 2 sprays into both nostrils 4 (four) times daily as needed for rhinitis 30 mL 3   lisdexamfetamine (VYVANSE ) 70 MG capsule Take 1 capsule (70 mg total) by mouth daily. 30 capsule 0   lisinopril  (ZESTRIL ) 30 MG tablet Take 1 tablet (30 mg total) by mouth at bedtime. 90 tablet 1   montelukast  (SINGULAIR ) 10 MG tablet Take 1 tablet (10 mg total) by mouth at bedtime. 30 tablet 11   rosuvastatin  (CRESTOR ) 10 MG tablet Take 1 tablet (10 mg total) by mouth daily. 90 tablet 1   solifenacin  (VESICARE ) 10 MG tablet Take 1 tablet (10 mg total) by mouth daily. 90 tablet 3   No facility-administered medications prior to visit.    PAST MEDICAL HISTORY: Past Medical History:  Diagnosis Date   Abdominal pain, left lower quadrant 11/08/2015   Abnormal Pap smear 2011   Anxiety    Arthritis    lower back and shoulders   Bronchitis    hx - used abluterol inhaler   Chronic diastolic heart failure (HCC)    Cognitive decline    Fall 03/15/2023   FIBROIDS, UTERUS 07/02/2009   Qualifier: Diagnosis of  By: Deanna MD, Cat     GERD (gastroesophageal reflux disease)    occasional   Hyperlipidemia    Hypersomnia, persistent 11/05/2017   Hypertension    Left shoulder pain 02/14/2016   Memory loss 01/30/2022   Narcolepsy    Osteopenia 04/2019   T score -1.7 FRAX 3.4% / 0.2%    Premature atrial contractions    PVC's (premature ventricular contractions)    Seasonal allergies    SVD (spontaneous  vaginal delivery)    x 3   Weight gain 01/19/2021    PAST SURGICAL HISTORY: Past Surgical History:  Procedure Laterality Date   ANTERIOR AND POSTERIOR REPAIR N/A 02/19/2017   Procedure: ANTERIOR (CYSTOCELE);  Surgeon: Gaston Hamilton, MD;  Location: [facility];  Service: Urology;  Laterality: N/A;   APPENDECTOMY     COLONOSCOPY     CYSTOSCOPY N/A 02/19/2017   Procedure: CYSTOSCOPY;  Surgeon: Gaston Hamilton, MD;  Location: [facility];  Service: Urology;  Laterality: N/A;   DILATION AND CURETTAGE OF UTERUS  2010   HYSTEROSCOPY W/ ENDOMETRIAL ABLATION  2010   SALPINGOOPHORECTOMY Bilateral 02/19/2017   Procedure: SALPINGO OOPHORECTOMY;  Surgeon: Rockney Evalene SQUIBB, MD;  Location: [facility];  Service: Gynecology;  Laterality: Bilateral;   TUBAL LIGATION     VAGINAL HYSTERECTOMY N/A 02/19/2017   Procedure: HYSTERECTOMY VAGINAL ,  BSO.;  Surgeon: Rockney Evalene SQUIBB, MD;  Location: [facility];  Service: Gynecology;  Laterality: N/A;  DO NOT OPEN LAP INSTRUMENTS   WISDOM TOOTH EXTRACTION      FAMILY HISTORY: Family History  Problem Relation Age of Onset   Hypertension Mother    Diabetes Mother    Stroke Mother    CAD Mother 23   Narcolepsy Mother        not diagnosed   Diabetes Father    Heart disease Father    Hypertension Sister    Cancer Sister        Colon cancer   Hypertension Brother    Cancer Brother        throat   Dementia Maternal Grandfather     SOCIAL HISTORY: Social History   Socioeconomic History   Marital status: Divorced    Spouse name: Not on file   Number of children: 3   Years of education: 12   Highest education level: Not on file  Occupational History   Occupation: Equities Trader: [employer]  Tobacco Use   Smoking status: Never    Passive exposure: Never   Smokeless tobacco: Never  Vaping Use   Vaping status: Never Used   Substance and Sexual Activity   Alcohol use: No    Alcohol/week: 0.0 standard drinks of alcohol   Drug use: No   Sexual activity: Not Currently    Birth control/protection: Post-menopausal, Surgical    Comment: 1st intercourse 54 yo-5 partners h/o STI  Other Topics Concern   Not on file  Social History Narrative   Caffeine 1 cup coffee in am.   Pt lives alone    Retired    Social Drivers of Corporate Investment Banker Strain: Not on file  Food Insecurity: No Food Insecurity (02/04/2024)   Hunger Vital Sign    Worried About Running Out of Food in the Last Year: Never true    Ran Out of Food in the Last Year: Never true  Transportation Needs: No Transportation Needs (02/04/2024)   PRAPARE - Administrator, Civil Service (Medical): No    Lack of Transportation (Non-Medical): No  Physical Activity: Inactive (02/04/2024)   Exercise Vital Sign    Days of Exercise per Week: 0 days    Minutes of Exercise per Session: 0 min  Stress: Not on file  Social Connections: Moderately Integrated (01/22/2023)   Social Connection and Isolation Panel    Frequency of Communication with Friends and Family: More than three times a week    Frequency of Social Gatherings with Friends and Family: More  than three times a week    Attends Religious Services: More than 4 times per year    Active Member of Clubs or Organizations: Yes    Attends Banker Meetings: More than 4 times per year    Marital Status: Divorced  Intimate Partner Violence: Not At Risk (01/22/2023)   Humiliation, Afraid, Rape, and Kick questionnaire    Fear of Current or Ex-Partner: No    Emotionally Abused: No    Physically Abused: No    Sexually Abused: No     PHYSICAL EXAM  There were no vitals filed for this visit.     There is no height or weight on file to calculate BMI.  Generalized: Well developed, in no acute distress  Cardiology: normal rate and rhythm, no murmur noted Respiratory: clear to  auscultation bilaterally  Neurological examination  Mentation: Alert oriented to time, place, history taking. Follows all commands speech and language fluent Cranial nerve II-XII: Pupils were equal round reactive to light. Extraocular movements were full, visual field were full  Motor: The motor testing reveals 5 over 5 strength of all 4 extremities. Good symmetric motor tone is noted throughout.  Gait and station: Gait is normal.    DIAGNOSTIC DATA (LABS, IMAGING, TESTING) - I reviewed patient records, labs, notes, testing and imaging myself where available.     11/28/2023    2:16 PM 10/30/2022    2:40 PM 09/11/2021    1:14 PM  MMSE - Mini Mental State Exam  Orientation to time 5 5 5   Orientation to Place 5 5 5   Registration 3 3 3   Attention/ Calculation 0 0 0  Recall 3 2 2   Language- name 2 objects 2 2 2   Language- repeat 0 0 0  Language- follow 3 step command 3 3 2   Language- read & follow direction 1 1 1   Write a sentence 0 0 1  Copy design 0 0 0  Total score 22 21 21      Lab Results  Component Value Date   WBC 16.8 (H) 04/15/2023   HGB 11.8 (L) 04/15/2023   HCT 37.8 04/15/2023   MCV 87.9 04/15/2023   PLT 348 04/15/2023      Component Value Date/Time   NA 142 01/07/2024 0955   K 4.5 01/07/2024 0955   CL 102 01/07/2024 0955   CO2 24 01/07/2024 0955   GLUCOSE 69 (L) 01/07/2024 0955   GLUCOSE 99 04/15/2023 1311   BUN 9 01/07/2024 0955   CREATININE 0.78 01/07/2024 0955   CREATININE 0.68 04/27/2016 1558   CALCIUM  9.2 01/07/2024 0955   PROT 7.1 04/15/2023 1311   PROT 6.6 02/23/2020 1123   ALBUMIN  4.3 04/15/2023 1311   ALBUMIN  4.3 02/23/2020 1123   AST 19 04/15/2023 1311   ALT 23 04/15/2023 1311   ALKPHOS 85 04/15/2023 1311   BILITOT 0.4 04/15/2023 1311   BILITOT 0.3 02/23/2020 1123   GFRNONAA >60 04/15/2023 1311   GFRNONAA >89 09/10/2013 1136   GFRAA 108 02/23/2020 1123   GFRAA >89 09/10/2013 1136   Lab Results  Component Value Date   CHOL 140 01/07/2024    HDL 45 01/07/2024   LDLCALC 74 01/07/2024   LDLDIRECT 136 (H) 08/09/2008   TRIG 114 01/07/2024   CHOLHDL 3.1 01/07/2024   Lab Results  Component Value Date   HGBA1C 6.1 01/07/2024   Lab Results  Component Value Date   VITAMINB12 650 01/30/2022   Lab Results  Component Value Date   TSH  1.260 01/30/2022     ASSESSMENT AND PLAN 68 y.o. year old female  has a past medical history of Abdominal pain, left lower quadrant (11/08/2015), Abnormal Pap smear (2011), Anxiety, Arthritis, Bronchitis, Chronic diastolic heart failure (HCC), Cognitive decline, Fall (03/15/2023), FIBROIDS, UTERUS (07/02/2009), GERD (gastroesophageal reflux disease), Hyperlipidemia, Hypersomnia, persistent (11/05/2017), Hypertension, Left shoulder pain (02/14/2016), Memory loss (01/30/2022), Narcolepsy, Osteopenia (04/2019), Premature atrial contractions, PVC's (premature ventricular contractions), Seasonal allergies, SVD (spontaneous vaginal delivery), and Weight gain (01/19/2021). here with   No diagnosis found.   Kathryn Hendricks continues to improve with CPAP compliance. Compliance report reveals sub optimal compliance, however, I am hopeful that she will be able to rest better. AHI is 9 at current autopap settings. I will increase max pressure to 13cmH20. She was encouraged to continue using CPAP nightly and for greater than 4 hours each night. We will update supply orders as indicated. Risks of untreated sleep apnea review and education materials provided. She will continue Vyvanse  70mg  daily. Memory stable. May consider additional testing at next visit as I am hopeful stress levels will improve. She will monitor mood closely and follow up with PCP for any concerns of anxiety or depression. Healthy lifestyle habits encouraged. She will follow up with me in 6 months. She verbalizes understanding and agreement with this plan.    No orders of the defined types were placed in this encounter.    No orders of the defined  types were placed in this encounter.    I spent 30 minutes of face-to-face and non-face-to-face time with patient.  This included previsit chart review, lab review, study review, order entry, electronic health record documentation, patient education.   Greig Forbes, FNP-C 06/23/2024, 11:19 AM [facility] [address], [address] [place], [place] [address] [phone]

## 2024-06-23 NOTE — Patient Instructions (Incomplete)
 Please continue using your CPAP regularly. While your insurance requires that you use CPAP at least 4 hours each night on 70% of the nights, I recommend, that you not skip any nights and use it throughout the night if you can. Getting used to CPAP and staying with the treatment long term does take time and patience and discipline. Untreated obstructive sleep apnea when it is moderate to severe can have an adverse impact on cardiovascular health and raise her risk for heart disease, arrhythmias, hypertension, congestive heart failure, stroke and diabetes. Untreated obstructive sleep apnea causes sleep disruption, nonrestorative sleep, and sleep deprivation. This can have an impact on your day to day functioning and cause daytime sleepiness and impairment of cognitive function, memory loss, mood disturbance, and problems focussing. Using CPAP regularly can improve these symptoms.  We will update supply orders, today. You are eligible for a new machine. I will order a repeat sleep study that you will do at home. Please listen out for a call from our sleep lab staff to schedule. Once you complete study, our sleep doctors with evaluate the data and we will use that to order a new machine as indicated. This process could take a few weeks. Once new machine is ordered, you will hear back from your DME company to schedule set up of your new machine.   We will need to see you back within 31-90 days following set up of your new CPAP to document compliance as required by your insurance company. Please call the office to schedule follow up when you receive your new machine. Please feel free to reach out with any questions or concerns.    Continue Vyvanse  70mg  daily. I feel you have more trouble with attention than with a degenerative memory disease.

## 2024-06-25 ENCOUNTER — Encounter: Payer: Self-pay | Admitting: Family Medicine

## 2024-06-25 ENCOUNTER — Ambulatory Visit: Admitting: Family Medicine

## 2024-06-25 VITALS — BP 130/82 | HR 82 | Ht 64.0 in | Wt 153.0 lb

## 2024-06-25 DIAGNOSIS — G4733 Obstructive sleep apnea (adult) (pediatric): Secondary | ICD-10-CM | POA: Diagnosis not present

## 2024-06-25 DIAGNOSIS — G3184 Mild cognitive impairment, so stated: Secondary | ICD-10-CM

## 2024-06-25 DIAGNOSIS — G4719 Other hypersomnia: Secondary | ICD-10-CM

## 2024-06-25 NOTE — Progress Notes (Signed)
 Kathryn Hendricks

## 2024-06-26 ENCOUNTER — Encounter: Payer: Self-pay | Admitting: Family Medicine

## 2024-06-26 ENCOUNTER — Ambulatory Visit: Admitting: Family Medicine

## 2024-06-26 VITALS — BP 122/79 | HR 77 | Ht 64.0 in | Wt 153.0 lb

## 2024-06-26 DIAGNOSIS — R319 Hematuria, unspecified: Secondary | ICD-10-CM | POA: Diagnosis not present

## 2024-06-26 LAB — POCT URINALYSIS DIP (MANUAL ENTRY)
Glucose, UA: NEGATIVE mg/dL
Ketones, POC UA: NEGATIVE mg/dL
Nitrite, UA: NEGATIVE
Protein Ur, POC: 30 mg/dL — AB
Spec Grav, UA: 1.02 (ref 1.010–1.025)
Urobilinogen, UA: 1 U/dL
pH, UA: 6 (ref 5.0–8.0)

## 2024-06-26 LAB — POCT UA - MICROSCOPIC ONLY

## 2024-06-26 NOTE — Progress Notes (Signed)
    SUBJECTIVE:   CHIEF COMPLAINT / HPI:   Discussed the use of AI scribe software for clinical note transcription with the patient, who gave verbal consent to proceed.  History of Present Illness   Kathryn Hendricks is a 68 year old female who presents with blood in her urine.  She has a history of microscopic hematuria, first noted in 2018, which has been persistent over the years. In 2022, a urologist evaluated her condition with a flexible cystoscopy. Despite the presence of blood, she experiences no dysuria or abdominal pain.  Her current medications remain unchanged since her last visit. She reports no new symptoms or concerns aside from the hematuria. No visible hematuria, dysuria, or abdominal pain.  She has a family history of cancer, which is relevant to her current evaluation for hematuria. Per the patient, she was seen by a urologist a few years ago for similar concerns and was advised that no follow-up is needed.       PERTINENT  PMH / PSH: PHx reviewed  OBJECTIVE:   BP 122/79   Pulse 77   Ht 5' 4 (1.626 m)   Wt 153 lb (69.4 kg)   SpO2 100%   BMI 26.26 kg/m   Physical Exam   VITALS: BP- 122/79 CHEST: Lungs clear to auscultation, no wheezing, no crepitus. CARDIOVASCULAR: Regular rhythm, no murmurs, strong heartbeat. ABDOMEN: Normal bowel sounds, no distention, no tenderness. EXTREMITIES: No leg swelling.      Results   LABS Urinalysis: Cloudy, small bilirubin, moderate blood, protein 30, small leukocyte (06/26/2024) Urine microscopy - RBC 0-3  DIAGNOSTIC Flexible cystoscopy: Normal bladder appearance (02/10/2021)       ASSESSMENT/PLAN:   Assessment & Plan Hematuria, unspecified type   Hematuria Chronic microscopic hematuria since 2018. Previous cystoscopy in 2022 showed no significant findings. Current urinalysis shows moderate blood, small amounts of bilirubin, protein 30, and small leukocytes. Urine microscopy results show RBC 0-3.  - Review of her  urine microscopy results of the current urinalysis was discussed with the patient. - I reviewed her report from urology and cystoscopy in 2022 which recommended no follow-up up - Monitor closely for now           Otto Fairly, MD [facility]

## 2024-06-26 NOTE — Assessment & Plan Note (Signed)
 Hematuria Chronic microscopic hematuria since 2018. Previous cystoscopy in 2022 showed no significant findings. Current urinalysis shows moderate blood, small amounts of bilirubin, protein 30, and small leukocytes. Urine microscopy results show RBC 0-3.  - Review of her urine microscopy results of the current urinalysis was discussed with the patient. - I reviewed her report from urology and cystoscopy in 2022 which recommended no follow-up up - Monitor closely for now

## 2024-06-26 NOTE — Patient Instructions (Signed)
 Blood in the Pee (Hematuria) in Adults: What to Know  Hematuria is blood in the pee. You may be able to see blood in the pee. In some cases, a health care provider may find blood with a test.  Blood in the pee can be caused by infections of the kidney, bladder, or the urethra. The urethra is the tube that drains pee from the bladder.  Other causes may include: Kidney stones. Infection of the prostate. Cancer. Too much calcium in the pee. Conditions that are passed from parent to child. Too much exercise. Infections can be treated with medicine. A kidney stone will usually leave your body when you pee. If infections or kidney stones didn't cause the blood in the urine, then more tests may be needed. It is very important to tell your provider about any blood in your pee, even if you have no pain or the blood stops with no treatment. Blood in the pee can be a sign of a very serious problem, such as cancer. Follow these instructions at home: Medicines Take your medicines only as told. If you were given antibiotics, take them as told. Do not stop taking them even if you start to feel better. Eating and drinking Drink more fluids as told. Aim to drink 3-4 quarts (2.8-3.8 L) a day. Avoid caffeine, tea, and carbonated drinks. These can bother the bladder. Avoid alcohol if a female because it may irritate the prostate. General instructions If you have been diagnosed with a kidney stone, strain your pee to catch the stone if told by your provider. Empty your bladder often. Avoid holding pee for a long time. If you're female, make sure that: You wipe from front to back after using the bathroom. You use each piece of toilet paper only once. You pee before and after sex. It's up to you to get the results of any tests. Ask when your results will be ready and how to get them. You may need to call or meet with your provider to get your results. Keep all follow-up visits. Your provider will need to know  about any changes or any new symptoms. Contact a health care provider if: Your symptoms don't get better after 3 days. Your symptoms get worse. You have back pain or belly pain. You have a fever or chills. You throw up or feel like you may throw up. You throw up every time you take medicine. Get help right away if: You pass blood clots in your pee. You pass out. These symptoms may be an emergency. Call 911 right away. Do not wait to see if the symptoms will go away. Do not drive yourself to the hospital. This information is not intended to replace advice given to you by your health care provider. Make sure you discuss any questions you have with your health care provider. Document Revised: 05/30/2023 Document Reviewed: 05/09/2023 Elsevier Patient Education  2024 [employer].

## 2024-06-29 ENCOUNTER — Encounter: Payer: Self-pay | Admitting: Radiology

## 2024-07-06 ENCOUNTER — Other Ambulatory Visit (HOSPITAL_COMMUNITY): Payer: Self-pay

## 2024-07-06 ENCOUNTER — Other Ambulatory Visit: Payer: Self-pay | Admitting: Family Medicine

## 2024-07-07 ENCOUNTER — Encounter: Payer: Self-pay | Admitting: Clinical

## 2024-07-07 ENCOUNTER — Ambulatory Visit (INDEPENDENT_AMBULATORY_CARE_PROVIDER_SITE_OTHER): Admitting: Clinical

## 2024-07-07 DIAGNOSIS — Z634 Disappearance and death of family member: Secondary | ICD-10-CM

## 2024-07-07 DIAGNOSIS — F4321 Adjustment disorder with depressed mood: Secondary | ICD-10-CM

## 2024-07-07 NOTE — Progress Notes (Unsigned)
 [facility] Counselor/Therapist Progress Note - IN-PERSON  Patient ID: ALEASE FAIT, MRN: [id number]    Date: 07/07/2024  Time Spent:  4:00pm   - 4:53pm  : 53 Minutes  Types of Service: Individual psychotherapy  Presenting Concerns:  Continues to grieve the loss of her son and experiencing stressors with caring for her daughter & her grandchildren.  Mental Status Exam: Appearance:  Neat     Behavior: Appropriate  Motor: Normal  Speech/Language:  Normal Rate  Affect: Appropriate  Mood: anxious and sad  Thought process: normal  Thought content:   WNL  Sensory/Perceptual disturbances:   WNL  Orientation: oriented to person, place, time/date, situation, and day of week  Attention: Good  Concentration: Good  Memory: WNL  Fund of knowledge:  Good  Insight:   Good  Judgment:  Good  Impulse Control: Good   Risk Assessment: Danger to Self:  No Self-injurious Behavior: No Danger to Others: No Duty to Warn:no   Subjective:  Ms. Kalijah reported feeling better overall since last visit.  She reported concerns for her daughter & grandchildren due to stressors they are experiencing.   Interventions: Humanistic/Existential and Grief Therapy  Client Response: Ms. Sinia reported that since the last visit, she has started to accept the loss of her son.  She reported she had a dream that helped her accept his death and give her a sense of joy that her son is with God.  Although she was sad, she felt she could let him go to the [place].  Ms. Sharri reported she has a purpose still in this life that includes helping out her other family members and giving joy to others.  Ms. Katrisha reported no other needs at this time.  She reported that she doesn't need ongoing therapy any longer so today will be the last day for therapy.   Diagnosis:  Adjustment disorder with depressed mood  Bereavement   Goals, Assessment & Plan:   Ms. Sahira was experiencing acceptance of  her loss and healthy grieving after her son's death.  Last visit with this clinician.  Frequency: As needed  Modality: In Person    Goal:  Increase her ability to verbalize her thoughts & feelings around the loss of her son - Ms. Leilany was able to verbalize her thoughts & feelings about the death of her son.  She has utilized various support systems and coping strategies - Ms. Janasha shared her acceptance of her loss and experiencing healthy grieving. - Ms. Aijah reported that she is doing much better and does not need to follow up with this clinician.  Today will be the last day and informed Ms. Maelee, if she needs future support, then she can call to schedule an appointment.  She acknowledged understanding.  Target Date: 08/27/2024  Progress: Completed    Gedalya Jim P. Trudy, MSW, LCSW [employer] Therapist Main Office: [phone]

## 2024-07-09 ENCOUNTER — Other Ambulatory Visit (HOSPITAL_COMMUNITY): Payer: Self-pay

## 2024-07-09 MED ORDER — LISDEXAMFETAMINE DIMESYLATE 70 MG PO CAPS
70.0000 mg | ORAL_CAPSULE | Freq: Every day | ORAL | 0 refills | Status: DC
  Filled 2024-07-09: qty 30, 30d supply, fill #0

## 2024-07-09 NOTE — Telephone Encounter (Signed)
 Dr.Athar you are work in provider, Amy is out of the office  Last seen on 06/25/24 per note  She will continue Vyvanse  70mg  daily  No follow up scheduled    Dispensed Days Supply Quantity Provider Pharmacy  lisdexamfetamine (VYVANSE ) 70 MG capsule 05/29/2024 30 30 capsule Lomax, Amy, NP [place] [facility]...     Rx pending to be signed

## 2024-07-13 ENCOUNTER — Other Ambulatory Visit (HOSPITAL_COMMUNITY): Payer: Self-pay

## 2024-08-24 ENCOUNTER — Other Ambulatory Visit (HOSPITAL_COMMUNITY): Payer: Self-pay

## 2024-08-24 ENCOUNTER — Other Ambulatory Visit: Payer: Self-pay | Admitting: Family Medicine

## 2024-08-24 ENCOUNTER — Other Ambulatory Visit: Payer: Self-pay

## 2024-08-24 MED ORDER — LISDEXAMFETAMINE DIMESYLATE 70 MG PO CAPS
70.0000 mg | ORAL_CAPSULE | Freq: Every day | ORAL | 0 refills | Status: AC
  Filled 2024-08-24: qty 30, 30d supply, fill #0

## 2024-08-25 ENCOUNTER — Other Ambulatory Visit: Payer: Self-pay | Admitting: Family Medicine

## 2024-08-25 ENCOUNTER — Other Ambulatory Visit (HOSPITAL_COMMUNITY): Payer: Self-pay

## 2024-08-25 ENCOUNTER — Other Ambulatory Visit: Payer: Self-pay

## 2024-08-25 ENCOUNTER — Other Ambulatory Visit: Payer: Self-pay | Admitting: Cardiovascular Disease

## 2024-08-25 MED ORDER — LISINOPRIL 30 MG PO TABS
30.0000 mg | ORAL_TABLET | Freq: Every day | ORAL | 1 refills | Status: AC
  Filled 2024-08-25: qty 90, 90d supply, fill #0

## 2024-08-25 MED ORDER — FUROSEMIDE 20 MG PO TABS
20.0000 mg | ORAL_TABLET | Freq: Every day | ORAL | 2 refills | Status: AC | PRN
  Filled 2024-08-25 – 2024-10-01 (×2): qty 90, 90d supply, fill #0

## 2024-09-07 ENCOUNTER — Other Ambulatory Visit (HOSPITAL_COMMUNITY): Payer: Self-pay

## 2024-09-18 ENCOUNTER — Ambulatory Visit: Admitting: Family Medicine

## 2024-09-25 ENCOUNTER — Encounter: Payer: Self-pay | Admitting: Family Medicine

## 2024-09-25 ENCOUNTER — Ambulatory Visit: Admitting: Family Medicine

## 2024-09-25 VITALS — BP 136/73 | HR 80 | Ht 64.0 in | Wt 149.0 lb

## 2024-09-25 DIAGNOSIS — R634 Abnormal weight loss: Secondary | ICD-10-CM | POA: Insufficient documentation

## 2024-09-25 DIAGNOSIS — I5032 Chronic diastolic (congestive) heart failure: Secondary | ICD-10-CM

## 2024-09-25 DIAGNOSIS — R2681 Unsteadiness on feet: Secondary | ICD-10-CM | POA: Diagnosis not present

## 2024-09-25 DIAGNOSIS — R413 Other amnesia: Secondary | ICD-10-CM | POA: Diagnosis not present

## 2024-09-25 DIAGNOSIS — R269 Unspecified abnormalities of gait and mobility: Secondary | ICD-10-CM

## 2024-09-25 DIAGNOSIS — T148XXA Other injury of unspecified body region, initial encounter: Secondary | ICD-10-CM | POA: Insufficient documentation

## 2024-09-25 NOTE — Patient Instructions (Signed)

## 2024-09-25 NOTE — Assessment & Plan Note (Addendum)
 Asymptomatic BP remains well controlled on Lisinopril  Lasix  20 mg prn leg swelling Consider Beta-blocker in the future. BP is otherwise in good range.

## 2024-09-25 NOTE — Assessment & Plan Note (Addendum)
 Intentional weight loss Recent extraction due to gum disease. Difficulty eating and weight loss. Prosthetic teeth provided, may need adjustments. - Continue to monitor nutritional intake and weight. - Consider further dental adjustments if needed.

## 2024-09-25 NOTE — Assessment & Plan Note (Addendum)
 Hematoma reducing in size, no pain, only itching. Differential includes deep bruise versus hematoma. Conservative management recommended. - Continue conservative management with ice pad and Tylenol  medicine as needed. - Monitor hematoma for further reduction in size. - Consider ultrasound or biopsy if the nodule does not resolve completely.

## 2024-09-25 NOTE — Progress Notes (Signed)
" ° ° °  SUBJECTIVE:   CHIEF COMPLAINT / HPI:   Discussed the use of AI scribe software for clinical note transcription with the patient, who gave verbal consent to proceed.  History of Present Illness   Kathryn Hendricks is a 69 year old female who presents with a hematoma/bump on the right side of her back.  Skin bump: She initially noticed the hematoma on September 04, 2024, after experiencing itching and discovering a knot on the right side of her back. She does not recall a specific incident causing the bruise but suspects it may be related to balance issues. An x-ray was performed at urgent care on September 05, 2024. The hematoma was diagnosed as a deep bruise. The swelling has decreased but remains present.  Gait impairment/Memory loss: She has a history of balance issues and memory concerns. No headaches or vision changes, but there are increased forgetfulness and difficulty with spelling. An MRI of the brain in 2016 revealed reduced blood supply in some areas. She has not had a recent MRI or CT scan of the brain and has not connected with physical therapy for her balance issues.   Weight loss: She has a recent dental extraction. All her bottom teeth were removed due to gum disease, and she has been unable to eat solid foods, resulting in weight loss. She is involved in church 21 day fasting activities.       PERTINENT  PMH / PSH: PMhx reviewed  OBJECTIVE:   BP 136/73   Pulse 80   Ht 5' 4 (1.626 m)   Wt 149 lb (67.6 kg)   SpO2 97%   BMI 25.58 kg/m   Physical Exam   VITALS: BP- 136/73 GEN: No distress NEURO: Grossly intact. Normal gait. No facial asymmetry and dysarthria.  HEENT: Bottom teeth missing. Gum is healthy looking.  CHEST: Lungs clear to auscultation bilaterally. CARDIOVASCULAR: Heart regular rate and rhythm, no murmurs. MSK: 2 cm x 2 cm nodular mass on right lateral side, a few inches below her left axilla. The nodule is raised, firm, non-tender, mildly  hyperpigmented. No surrounding erythema, no fluctuance.       ASSESSMENT/PLAN:   Assessment & Plan Memory impairment Increased forgetfulness and balance issues. Previous MRI showed reduced blood supply. No recent MRI. Discussed potential dementia medications with neurology. - Ordered MRI of the brain. - Ordered blood tests for thyroid  function, HIV, syphilis, and vitamin B12. - Will discuss potential dementia medications/Aricept with neurology. Gait instability MRI brain ordered PT referral ordered Chronic diastolic congestive heart failure (HCC) Asymptomatic BP remains well controlled on Lisinopril  Lasix  20 mg prn leg swelling Consider Beta-blocker in the future. BP is otherwise in good range.   Hematoma Hematoma reducing in size, no pain, only itching. Differential includes deep bruise versus hematoma. Conservative management recommended. - Continue conservative management with ice pad and Tylenol  medicine as needed. - Monitor hematoma for further reduction in size. - Consider ultrasound or biopsy if the nodule does not resolve completely. Weight loss Intentional weight loss Recent extraction due to gum disease. Difficulty eating and weight loss. Prosthetic teeth provided, may need adjustments. - Continue to monitor nutritional intake and weight. - Consider further dental adjustments if needed.   General Health Maintenance Routine health maintenance discussed, including screenings and lifestyle modifications. - Schedule colonoscopy for October. - Schedule mammogram for August. - Encouraged balanced diet and adequate hydration.  Otto Fairly, MD [facility]  "

## 2024-09-25 NOTE — Assessment & Plan Note (Addendum)
 MRI brain ordered PT referral ordered

## 2024-09-25 NOTE — Assessment & Plan Note (Addendum)
 Increased forgetfulness and balance issues. Previous MRI showed reduced blood supply. No recent MRI. Discussed potential dementia medications with neurology. - Ordered MRI of the brain. - Ordered blood tests for thyroid  function, HIV, syphilis, and vitamin B12. - Will discuss potential dementia medications/Aricept with neurology.

## 2024-09-26 ENCOUNTER — Ambulatory Visit: Payer: Self-pay | Admitting: Family Medicine

## 2024-09-26 NOTE — Telephone Encounter (Signed)
 3rd attempt made to contact patient. I will try to reach her again on Monday. Will discuss with oncology prior to calling again.

## 2024-09-26 NOTE — Telephone Encounter (Signed)
 WBC abnormally elevated. Given weight loss and skin symptoms she had, I worry about blood malignancy.  HIPAA compliant callback message left. Will recommend ED assessment over the weekend if am a able to reach her.  Will attempt callback later.

## 2024-09-27 NOTE — Telephone Encounter (Signed)
 Case discussed with the oncall oncologist - Dr. Federico. He recommended CBC with differentials and outpatient referral to oncology as this might be CLL.  I attempted to call patient with no response.HIPAA compliant callback message left.  I called her emergency contact with no response, but I did not leave a message. Will consider welfare check on Monday if there is still no response.

## 2024-09-27 NOTE — Telephone Encounter (Signed)
 If patient calls back, please help her schedule an appointment with me in the afternoon of 08/29/24. Thanks.

## 2024-09-27 NOTE — Telephone Encounter (Signed)
 Call attempt. HIPAA compliant callback message left. Also called emergency contact and left a message to have mom call back soon.

## 2024-09-28 NOTE — Telephone Encounter (Signed)
 Police office returned my call. Patient is well. She did not pick up call due to unknown number, although I also called with office number and left messages.  She said she did not listen to her messages. Lab result discussed. Follow up plan discussed. She said she now has a new bruise on her thigh with no known injury/trauma to the area.  Appointment scheduled tomorrow for reassessment and repeat lab.

## 2024-09-28 NOTE — Telephone Encounter (Signed)
 Attempted to reach patient twice today with no luck. I worry about her as this is unusual. Called daughter multiple times yesterday with no luck.  I have reached out to the [employer] non-emergency line to request a welfare check. Spoke with office [place]. My phone number provider for update.

## 2024-09-29 ENCOUNTER — Ambulatory Visit: Admitting: Family Medicine

## 2024-09-29 ENCOUNTER — Encounter: Payer: Self-pay | Admitting: Family Medicine

## 2024-09-29 ENCOUNTER — Telehealth: Payer: Self-pay | Admitting: Family Medicine

## 2024-09-29 VITALS — BP 126/76 | HR 82 | Ht 64.0 in | Wt 148.0 lb

## 2024-09-29 DIAGNOSIS — M898X8 Other specified disorders of bone, other site: Secondary | ICD-10-CM

## 2024-09-29 DIAGNOSIS — R748 Abnormal levels of other serum enzymes: Secondary | ICD-10-CM | POA: Insufficient documentation

## 2024-09-29 DIAGNOSIS — R7989 Other specified abnormal findings of blood chemistry: Secondary | ICD-10-CM | POA: Diagnosis not present

## 2024-09-29 DIAGNOSIS — T148XXA Other injury of unspecified body region, initial encounter: Secondary | ICD-10-CM

## 2024-09-29 DIAGNOSIS — D649 Anemia, unspecified: Secondary | ICD-10-CM | POA: Diagnosis not present

## 2024-09-29 DIAGNOSIS — D72829 Elevated white blood cell count, unspecified: Secondary | ICD-10-CM | POA: Insufficient documentation

## 2024-09-29 DIAGNOSIS — M8580 Other specified disorders of bone density and structure, unspecified site: Secondary | ICD-10-CM | POA: Diagnosis not present

## 2024-09-29 LAB — CMP14+EGFR
ALT: 54 [IU]/L — AB (ref 0–32)
AST: 48 [IU]/L — AB (ref 0–40)
Albumin: 4.4 g/dL (ref 3.9–4.9)
Alkaline Phosphatase: 112 [IU]/L (ref 49–135)
BUN/Creatinine Ratio: 12 (ref 12–28)
BUN: 9 mg/dL (ref 8–27)
Bilirubin Total: 0.3 mg/dL (ref 0.0–1.2)
CO2: 24 mmol/L (ref 20–29)
Calcium: 9 mg/dL (ref 8.7–10.3)
Chloride: 103 mmol/L (ref 96–106)
Creatinine, Ser: 0.75 mg/dL (ref 0.57–1.00)
Globulin, Total: 2.1 g/dL (ref 1.5–4.5)
Glucose: 75 mg/dL (ref 70–99)
Potassium: 3.7 mmol/L (ref 3.5–5.2)
Sodium: 142 mmol/L (ref 134–144)
Total Protein: 6.5 g/dL (ref 6.0–8.5)
eGFR: 88 mL/min/{1.73_m2}

## 2024-09-29 LAB — CBC
Hematocrit: 31.7 % — ABNORMAL LOW (ref 34.0–46.6)
Hemoglobin: 10.3 g/dL — ABNORMAL LOW (ref 11.1–15.9)
MCH: 30 pg (ref 26.6–33.0)
MCHC: 32.5 g/dL (ref 31.5–35.7)
MCV: 92 fL (ref 79–97)
Platelets: 305 10*3/uL (ref 150–450)
RBC: 3.43 x10E6/uL — ABNORMAL LOW (ref 3.77–5.28)
RDW: 15.2 % (ref 11.7–15.4)
WBC: 112.1 10*3/uL (ref 3.4–10.8)

## 2024-09-29 LAB — HIV ANTIBODY (ROUTINE TESTING W REFLEX): HIV Screen 4th Generation wRfx: NONREACTIVE

## 2024-09-29 LAB — SYPHILIS: RPR W/REFLEX TO RPR TITER AND TREPONEMAL ANTIBODIES, TRADITIONAL SCREENING AND DIAGNOSIS ALGORITHM: RPR Ser Ql: NONREACTIVE

## 2024-09-29 LAB — PROTIME-INR
INR: 1 (ref 0.9–1.2)
Prothrombin Time: 10.9 s (ref 9.1–12.0)

## 2024-09-29 LAB — VITAMIN B12: Vitamin B-12: >2000 pg/mL — AB (ref 232–1245)

## 2024-09-29 LAB — TSH RFX ON ABNORMAL TO FREE T4: TSH: 2.8 u[IU]/mL (ref 0.450–4.500)

## 2024-09-29 NOTE — Telephone Encounter (Signed)
-----   Message from [facility] Reena S sent at 09/28/2024  9:49 AM EST ----- Hey!  Ok to schedule MRI at a [facility] facility.  Thank you!!  Margit

## 2024-09-29 NOTE — Assessment & Plan Note (Signed)
 WBC count elevated at 112,000. Differential includes chronic leukemia. Oncologist consulted. Further tests are needed for diagnosis and to guide treatment. - Repeated blood tests to confirm WBC elevation. - If WBC remains elevated, refer to the oncologist for further evaluation and possible bone marrow biopsy. - CBC with diff ordered. Blood smear ordered, but the lab advised that this reflects the CBC with diff order. - I will contact her soon with her test results - Lab report, reviewed in detail with her and her daughter/Sheletta, who was present for the visit.  - Note that she had WBC of 16 in 2024 when she visited the ED. This could be reactive and routine monitoring not indicated given being asymptomatic.

## 2024-09-29 NOTE — Telephone Encounter (Signed)
 Patient is scheduled for MRI on February 9th @1130 . Patient was notified.

## 2024-09-29 NOTE — Patient Instructions (Signed)
 High Number of White Blood Cells (Leukocytosis): What to Know  Leukocytosis means that a person has more white blood cells than normal. White blood cells are made in the bone marrow. Bone marrow is the spongy tissue inside bones. The main job of white blood cells is to fight infection. Having too many white blood cells is common. What are the causes? Leukocytosis may be caused by various conditions. In some cases, the bone marrow is normal but is still making too many white blood cells. This could be the result of: Infection. Injury. Physical stress. Emotional stress. Surgery. Allergic reactions. Certain medicines. Other causes may include: A genetic disease or a disease that is inherited, which means it's passed down in families. Chronic inflammatory conditions. Tumors that start in other areas of the body, but not in the blood or bone marrow. Pregnancy and labor. In other cases, a person may have a bone marrow disorder that is causing the body to make too many white blood cells. Bone marrow disorders include: Leukemia. This is a type of blood cancer. Myeloproliferative disorders. These disorders cause blood cells to grow in ways that aren't normal. What are the signs or symptoms? Often, leukocytosis doesn't cause any symptoms. Some people may have symptoms due to the medical problem that is causing their leukocytosis. These symptoms may include: Bleeding. Bruising. Fever. Night sweats. Weakness. Weight loss. Other symptoms may include: An enlarged spleen. Swollen lymph nodes. Repeated infections. How is this diagnosed? This is diagnosed with blood tests. It's often found when blood is tested as part of a physical exam. You may have other tests to help determine why you have too many white blood cells. These tests may include: A complete blood count (CBC). This test measures all the types of blood cells in your body. Chest X-rays, pee tests, or other tests to look for signs of  infection. Bone marrow aspiration. For this test, a needle is put into your bone. Cells from the bone marrow are removed through the needle for testing. Other tests on the blood or bone marrow sample. CT scans, bone scans, or other imaging tests. How is this treated? Usually, treatment is not needed for leukocytosis. However, if an infection, cancer, bone marrow disorder, or other serious problem is causing your leukocytosis, it will need to be treated. Treatment may include: Regular monitoring of your white blood cell count to look for changes. Antibiotics if you have an infection caused by a germ, also called bacteria. Bone marrow transplant. This treatment replaces your diseased bone marrow with healthy cells that will grow new bone marrow. Chemotherapy or antibody therapy. These treatments may be used to kill cancer cells or to decrease the number of white blood cells. Follow these instructions at home: Medicines Take your medicines only as told. If you were given antibiotics, take them as told. Do not stop taking them even if you start to feel better. Eating and drinking  Eat foods that are low in saturated fats and high in fiber. Eat plenty of fruits and vegetables. Drink more fluids as told. Limit how much caffeine and alcohol you have. General instructions Stay at a healthy weight. Ask your health care provider what weight is best for you. Do 30 minutes of exercise at least 5 times each week. Check with your provider before you start a new exercise routine. Follow any safety precautions as told by your provider if you are at increased risk for infection or bleeding. Do not smoke, vape, or use nicotine or  tobacco. Keep all follow-up visits. Your provider will need to monitor your amount of white blood cells. Contact a health care provider if: You feel weaker or more tired than usual. You develop chills, a cough, or a stuffy nose. You have a fever. You lose weight without  trying. You have night sweats. You bruise easily. You have new or worse symptoms. Get help right away if: You bleed more than normal, or your bleeding is hard to stop. You have chest pain or trouble breathing. You feel like you may throw up or you throw up and it doesn't stop. You feel dizzy or lightheaded, or you faint. These symptoms may be an emergency. Call 911 right away. Do not wait to see if the symptoms will go away. Do not drive yourself to the hospital. This information is not intended to replace advice given to you by your health care provider. Make sure you discuss any questions you have with your health care provider. Document Revised: 02/25/2024 Document Reviewed: 02/25/2024 Elsevier Patient Education  2025 [employer].

## 2024-09-30 ENCOUNTER — Inpatient Hospital Stay

## 2024-09-30 ENCOUNTER — Telehealth: Payer: Self-pay | Admitting: Family Medicine

## 2024-09-30 ENCOUNTER — Ambulatory Visit: Payer: Self-pay | Admitting: Family Medicine

## 2024-09-30 ENCOUNTER — Encounter: Payer: Self-pay | Admitting: Physician Assistant

## 2024-09-30 ENCOUNTER — Telehealth: Payer: Self-pay

## 2024-09-30 ENCOUNTER — Inpatient Hospital Stay: Admitting: Physician Assistant

## 2024-09-30 VITALS — BP 138/66 | HR 79 | Temp 97.5°F | Resp 20 | Wt 145.6 lb

## 2024-09-30 DIAGNOSIS — D72829 Elevated white blood cell count, unspecified: Secondary | ICD-10-CM

## 2024-09-30 DIAGNOSIS — D72828 Other elevated white blood cell count: Secondary | ICD-10-CM

## 2024-09-30 LAB — CMP (CANCER CENTER ONLY)
ALT: 37 U/L (ref 0–44)
AST: 34 U/L (ref 15–41)
Albumin: 4.5 g/dL (ref 3.5–5.0)
Alkaline Phosphatase: 113 U/L (ref 38–126)
Anion gap: 10 (ref 5–15)
BUN: 11 mg/dL (ref 8–23)
CO2: 27 mmol/L (ref 22–32)
Calcium: 9.3 mg/dL (ref 8.9–10.3)
Chloride: 106 mmol/L (ref 98–111)
Creatinine: 0.73 mg/dL (ref 0.44–1.00)
GFR, Estimated: >60 mL/min
Glucose, Bld: 86 mg/dL (ref 70–99)
Potassium: 3.9 mmol/L (ref 3.5–5.1)
Sodium: 142 mmol/L (ref 135–145)
Total Bilirubin: 0.4 mg/dL (ref 0.0–1.2)
Total Protein: 7.1 g/dL (ref 6.5–8.1)

## 2024-09-30 LAB — CBC WITH DIFFERENTIAL (CANCER CENTER ONLY)
Abs Immature Granulocytes: 31.9 10*3/uL — ABNORMAL HIGH (ref 0.00–0.07)
Band Neutrophils: 9 %
Basophils Absolute: 5.9 10*3/uL — ABNORMAL HIGH (ref 0.0–0.1)
Basophils Relative: 5 %
Eosinophils Absolute: 2.4 10*3/uL — ABNORMAL HIGH (ref 0.0–0.5)
Eosinophils Relative: 2 %
HCT: 31.9 % — ABNORMAL LOW (ref 36.0–46.0)
Hemoglobin: 10.3 g/dL — ABNORMAL LOW (ref 12.0–15.0)
Lymphocytes Relative: 6 %
Lymphs Abs: 7.1 10*3/uL — ABNORMAL HIGH (ref 0.7–4.0)
MCH: 29.3 pg (ref 26.0–34.0)
MCHC: 32.3 g/dL (ref 30.0–36.0)
MCV: 90.9 fL (ref 80.0–100.0)
Metamyelocytes Relative: 5 %
Monocytes Absolute: 3.5 10*3/uL — ABNORMAL HIGH (ref 0.1–1.0)
Monocytes Relative: 3 %
Myelocytes: 13 %
Neutro Abs: 67.3 10*3/uL — ABNORMAL HIGH (ref 1.7–7.7)
Neutrophils Relative %: 48 %
Platelet Count: 359 10*3/uL (ref 150–400)
Promyelocytes Relative: 9 %
RBC: 3.51 MIL/uL — ABNORMAL LOW (ref 3.87–5.11)
RDW: 17.1 % — ABNORMAL HIGH (ref 11.5–15.5)
WBC Count: 118 10*3/uL (ref 4.0–10.5)
nRBC: 0.9 % — ABNORMAL HIGH (ref 0.0–0.2)

## 2024-09-30 LAB — ACUTE VIRAL HEPATITIS (HAV, HBV, HCV)
HCV Ab: NONREACTIVE
Hep A IgM: NEGATIVE
Hep B C IgM: NEGATIVE
Hepatitis B Surface Ag: NEGATIVE

## 2024-09-30 LAB — C-REACTIVE PROTEIN: CRP: <0.5 mg/dL

## 2024-09-30 LAB — LACTATE DEHYDROGENASE: LDH: 967 U/L — ABNORMAL HIGH (ref 105–235)

## 2024-09-30 LAB — URIC ACID: Uric Acid, Serum: 6.2 mg/dL (ref 2.5–7.1)

## 2024-09-30 LAB — SEDIMENTATION RATE: Sed Rate: 22 mm/h (ref 0–22)

## 2024-09-30 LAB — HCV INTERPRETATION

## 2024-09-30 LAB — SAVE SMEAR(SSMR), FOR PROVIDER SLIDE REVIEW

## 2024-09-30 NOTE — Addendum Note (Signed)
 Addended by: ANDERS CUMINS T on: 09/30/2024 08:18 AM   Modules accepted: Orders

## 2024-09-30 NOTE — Telephone Encounter (Signed)
 Per the resident's documentation, WBC preliminary report + 119, an increase from 112. Will discuss with patient and refer to oncology outpatient. Final result pending.

## 2024-09-30 NOTE — Telephone Encounter (Signed)
 Received call from [facility] regarding critical lab level for WBC.   They were calling on lab from 09/25/24 of 112.1. Advised that provider was aware.   Chiquita JAYSON English, RN

## 2024-09-30 NOTE — Telephone Encounter (Signed)
 Lab discussed. WBC worsened with an increase from 112 to 116  Hemoglobin stable with normal Iron Sat. Ferrin elevation might be a reactant related to WBC--likely anemia of chronic disease.  LDH is elevated, which can be seen in the setting of high cell turnover from blood disorders, such as proliferative bone marrow disorders.  Liver enzymes improved.  Blood smear, CBC differentials part,  Vit D, and other tests are pending. I advised her that the Vit D was added this morning, as it was not ordered yesterday.  Overall concern about blood malignancy/disorder. Already connected her with oncology with a referral order in place.   During the conversation, she said she was receiving a call, which might be from the oncology office, so I hung up so she could answer. I will check in on her later.   She was appreciative of the call.

## 2024-09-30 NOTE — Telephone Encounter (Signed)
 I called back to check on her. She has an appointment at the cancer center today. All questions were answered. Follow up with me on 2/13 as planned. She was appreciative of the call.

## 2024-09-30 NOTE — Telephone Encounter (Signed)
 CRITICAL VALUE STICKER  CRITICAL VALUE:  WBC  118  RECEIVER (on-site recipient of call): Sherrilyn Sobers, LPN  DATE & TIME NOTIFIED:   09/30/24  3:39  MESSENGER (representative from lab): Amber  MD NOTIFIED: [place] [employer], [place]  TIME OF NOTIFICATION:  3:39  RESPONSE:

## 2024-09-30 NOTE — Addendum Note (Signed)
 Addended by: ANDERS CUMINS T on: 09/30/2024 04:29 AM   Modules accepted: Orders

## 2024-09-30 NOTE — Progress Notes (Unsigned)
 " [facility] [phone]   [phone]  INITIAL CONSULT NOTE  Patient Care Team: Anders Otto DASEN, MD as PCP - General (Family Medicine) Verlin Lonni BIRCH, MD as PCP - Cardiology (Cardiology)  Hematological/Oncological History Labs with PCP: 11/13/2022: WBC 12.6 (H), Hgb 11.0 (L), MCV 88.4, Plt 270 04/15/2023: WBC 16.8 (H), Hgb 10.3 (L), MCV 92, Plt 305 09/25/2024: WBC 112.1 (H), Hgb 10.3 (L), MCV 92, Plt 305 09/29/2024: WBC 119.0(H), Hgb 10.7 (L), MCV 91, Plt 330 09/30/2024: Establish care with [facility]cology  CHIEF COMPLAINTS/PURPOSE OF CONSULTATION:  Leukocytosis  HISTORY OF PRESENTING ILLNESS:  Kathryn Hendricks 69 y.o. female with medical history significant for hypertension, hyperlipidemia, GERD, osteopenia and chronic diastolic heart failure presents for initial evaluation of leukocytosis. She is accompanied by her daughter for this visit.   On exam today, Ms. Keeran reports that she denies any changes to her health. She does have periodic episodes of fatigue but is generally able to complete her ADLs on her own. She reports weight loss but contributes this to recent dental extractions with difficulty eating. She is planning to follow up with her dentist tomorrow. She denies nausea, vomiting or bowel habit changes. She denies easy bruising or overt signs of bleeding. She denies fevers, chills, sweats, shortness of breath, chest pain or cough. She has no other complaints. Rest of the ROS is below.   MEDICAL HISTORY:  Past Medical History:  Diagnosis Date   Abdominal pain, left lower quadrant 11/08/2015   Abnormal Pap smear 2011   Anxiety    Arthritis    lower back and shoulders   Bronchitis    hx - used abluterol inhaler   Chronic diastolic heart failure (HCC)    Cognitive decline    Fall 03/15/2023   FIBROIDS, UTERUS 07/02/2009   Qualifier: Diagnosis of  By: Deanna MD, Cat     GERD (gastroesophageal reflux disease)    occasional    Hyperlipidemia    Hypersomnia, persistent 11/05/2017   Hypertension    Left shoulder pain 02/14/2016   Memory loss 01/30/2022   Narcolepsy    Osteopenia 04/2019   T score -1.7 FRAX 3.4% / 0.2%   Premature atrial contractions    PVC's (premature ventricular contractions)    Seasonal allergies    SVD (spontaneous vaginal delivery)    x 3   Weight gain 01/19/2021    SURGICAL HISTORY: Past Surgical History:  Procedure Laterality Date   ANTERIOR AND POSTERIOR REPAIR N/A 02/19/2017   Procedure: ANTERIOR (CYSTOCELE);  Surgeon: Gaston Hamilton, MD;  Location: [facility];  Service: Urology;  Laterality: N/A;   APPENDECTOMY     COLONOSCOPY     CYSTOSCOPY N/A 02/19/2017   Procedure: CYSTOSCOPY;  Surgeon: Gaston Hamilton, MD;  Location: [facility];  Service: Urology;  Laterality: N/A;   DILATION AND CURETTAGE OF UTERUS  2010   HYSTEROSCOPY W/ ENDOMETRIAL ABLATION  2010   SALPINGOOPHORECTOMY Bilateral 02/19/2017   Procedure: SALPINGO OOPHORECTOMY;  Surgeon: Rockney Evalene SQUIBB, MD;  Location: [facility];  Service: Gynecology;  Laterality: Bilateral;   TUBAL LIGATION     VAGINAL HYSTERECTOMY N/A 02/19/2017   Procedure: HYSTERECTOMY VAGINAL ,  BSO.;  Surgeon: Rockney Evalene SQUIBB, MD;  Location: [facility];  Service: Gynecology;  Laterality: N/A;  DO NOT OPEN LAP INSTRUMENTS   WISDOM TOOTH EXTRACTION      SOCIAL HISTORY: Social History   Socioeconomic History   Marital status: Divorced    Spouse name: Not on file  Number of children: 3   Years of education: 12   Highest education level: Not on file  Occupational History   Occupation: Housekeeping    Employer: [employer]  Tobacco Use   Smoking status: Never    Passive exposure: Never   Smokeless tobacco: Never  Vaping Use   Vaping status: Never Used  Substance and Sexual Activity   Alcohol use: No    Alcohol/week: 0.0 standard drinks of alcohol   Drug use: No   Sexual activity: Not Currently    Birth control/protection: Post-menopausal,  Surgical    Comment: 1st intercourse 62 yo-5 partners h/o STI  Other Topics Concern   Not on file  Social History Narrative   Caffeine 1 cup coffee in am.   Pt lives alone    Retired    Social Drivers of Health   Tobacco Use: Low Risk (09/30/2024)   Patient History    Smoking Tobacco Use: Never    Smokeless Tobacco Use: Never    Passive Exposure: Never  Financial Resource Strain: Not on file  Food Insecurity: No Food Insecurity (02/04/2024)   Hunger Vital Sign    Worried About Running Out of Food in the Last Year: Never true    Ran Out of Food in the Last Year: Never true  Transportation Needs: No Transportation Needs (02/04/2024)   PRAPARE - Administrator, Civil Service (Medical): No    Lack of Transportation (Non-Medical): No  Physical Activity: Inactive (02/04/2024)   Exercise Vital Sign    Days of Exercise per Week: 0 days    Minutes of Exercise per Session: 0 min  Stress: Not on file  Social Connections: Moderately Integrated (01/22/2023)   Social Connection and Isolation Panel    Frequency of Communication with Friends and Family: More than three times a week    Frequency of Social Gatherings with Friends and Family: More than three times a week    Attends Religious Services: More than 4 times per year    Active Member of [employer] or Organizations: Yes    Attends Banker Meetings: More than 4 times per year    Marital Status: Divorced  Intimate Partner Violence: Not At Risk (01/22/2023)   Humiliation, Afraid, Rape, and Kick questionnaire    Fear of Current or Ex-Partner: No    Emotionally Abused: No    Physically Abused: No    Sexually Abused: No  Depression (PHQ2-9): Low Risk (09/25/2024)   Depression (PHQ2-9)    PHQ-2 Score: 4  Alcohol Screen: Low Risk (02/04/2024)   Alcohol Screen    Last Alcohol Screening Score (AUDIT): 0  Housing: Unknown (02/04/2024)   Housing Stability Vital Sign    Unable to Pay for Housing in the Last Year: No    Number  of Times Moved in the Last Year: Not on file    Homeless in the Last Year: No  Recent Concern: Housing - High Risk (02/04/2024)   Housing Stability Vital Sign    Unable to Pay for Housing in the Last Year: Yes    Number of Times Moved in the Last Year: Not on file    Homeless in the Last Year: Yes  Utilities: Not on file  Health Literacy: Not on file    FAMILY HISTORY: Family History  Problem Relation Age of Onset   Hypertension Mother    Diabetes Mother    Stroke Mother    CAD Mother 63   Narcolepsy Mother  not diagnosed   Diabetes Father    Heart disease Father    Hypertension Sister    Cancer Sister        Colon cancer   Hypertension Brother    Cancer Brother        throat   Dementia Maternal Grandfather     ALLERGIES:  is allergic to penicillins and penicillin g.  MEDICATIONS:  Current Outpatient Medications  Medication Sig Dispense Refill   acetaminophen  (TYLENOL ) 500 MG tablet Take 500 mg by mouth 2 (two) times daily as needed.     albuterol  (VENTOLIN  HFA) 108 (90 Base) MCG/ACT inhaler Inhale 2 puffs into the lungs every 6 (six) hours as needed for wheezing or shortness of breath. 18 g 1   Blood Pressure Monitoring (OMRON 3 SERIES BP MONITOR) DEVI See admin instructions.     fluticasone  (FLONASE ) 50 MCG/ACT nasal spray Place 1-2 sprays into both nostrils daily. 16 g 11   fluticasone  (FLOVENT  HFA) 110 MCG/ACT inhaler Inhale 2 puffs into the lungs 2 (two) times daily as needed. 12 g 2   furosemide  (LASIX ) 20 MG tablet Take 1 tablet (20 mg total) by mouth daily as needed (shortness of breath or puffy ankles). 90 tablet 2   ipratropium (ATROVENT ) 0.03 % nasal spray Place 2 sprays into both nostrils 4 (four) times daily as needed for rhinitis 30 mL 3   lisdexamfetamine  (VYVANSE ) 70 MG capsule Take 1 capsule (70 mg total) by mouth daily. 30 capsule 0   lisinopril  (ZESTRIL ) 30 MG tablet Take 1 tablet (30 mg total) by mouth at bedtime. 90 tablet 1   montelukast   (SINGULAIR ) 10 MG tablet Take 1 tablet (10 mg total) by mouth at bedtime. 30 tablet 11   rosuvastatin  (CRESTOR ) 10 MG tablet Take 1 tablet (10 mg total) by mouth daily. 90 tablet 1   solifenacin  (VESICARE ) 10 MG tablet Take 1 tablet (10 mg total) by mouth daily. 90 tablet 3   No current facility-administered medications for this visit.    REVIEW OF SYSTEMS:   Constitutional: ( - ) fevers, ( - )  chills , ( - ) night sweats Eyes: ( - ) blurriness of vision, ( - ) double vision, ( - ) watery eyes Ears, nose, mouth, throat, and face: ( - ) mucositis, ( - ) sore throat Respiratory: ( - ) cough, ( - ) dyspnea, ( - ) wheezes Cardiovascular: ( - ) palpitation, ( - ) chest discomfort, ( - ) lower extremity swelling Gastrointestinal:  ( - ) nausea, ( - ) heartburn, ( - ) change in bowel habits Skin: ( - ) abnormal skin rashes Lymphatics: ( - ) new lymphadenopathy, ( - ) easy bruising Neurological: ( - ) numbness, ( - ) tingling, ( - ) new weaknesses Behavioral/Psych: ( - ) mood change, ( - ) new changes  All other systems were reviewed with the patient and are negative.  PHYSICAL EXAMINATION: ECOG PERFORMANCE STATUS: 0 - Asymptomatic  Vitals:   09/30/24 1400  BP: 138/66  Pulse: 79  Resp: 20  Temp: (!) 97.5 F (36.4 C)  SpO2: 100%   Filed Weights   09/30/24 1400  Weight: 145 lb 9.6 oz (66 kg)    GENERAL: well appearing female in NAD  SKIN: skin color, texture, turgor are normal, no rashes or significant lesions EYES: conjunctiva are pink and non-injected, sclera clear OROPHARYNX: no exudate, no erythema; lips, buccal mucosa, and tongue normal  NECK: supple, non-tender LYMPH:  no palpable  lymphadenopathy in the cervical, axillary or supraclavicular lymph nodes.  LUNGS: clear to auscultation and percussion with normal breathing effort HEART: regular rate & rhythm and no murmurs and no lower extremity edema Musculoskeletal: no cyanosis of digits and no clubbing  PSYCH: alert &  oriented x 3, fluent speech NEURO: no focal motor/sensory deficits  LABORATORY DATA:  I have reviewed the data as listed    Latest Ref Rng & Units 09/29/2024    3:27 PM 09/25/2024   11:21 AM 04/15/2023    1:11 PM  CBC  WBC 3.4 - 10.8 x10E3/uL 119.0  112.1  16.8   Hemoglobin 11.1 - 15.9 g/dL 89.2  89.6  88.1   Hematocrit 34.0 - 46.6 % 33.5  31.7  37.8   Platelets 150 - 450 x10E3/uL 330  305  348        Latest Ref Rng & Units 09/29/2024    3:27 PM 09/25/2024   11:21 AM 01/07/2024    9:55 AM  CMP  Glucose 70 - 99 mg/dL 86  75  69   BUN 8 - 27 mg/dL 8  9  9    Creatinine 0.57 - 1.00 mg/dL 9.15  9.24  9.21   Sodium 134 - 144 mmol/L 144  142  142   Potassium 3.5 - 5.2 mmol/L 3.9  3.7  4.5   Chloride 96 - 106 mmol/L 105  103  102   CO2 20 - 29 mmol/L 25  24  24    Calcium  8.7 - 10.3 mg/dL 9.4  9.0  9.2   Total Protein 6.0 - 8.5 g/dL 6.3  6.5    Total Bilirubin 0.0 - 1.2 mg/dL 0.3  0.3    Alkaline Phos 49 - 135 IU/L 114  112    AST 0 - 40 IU/L 29  48    ALT 0 - 32 IU/L 38  54      ASSESSMENT & PLAN Yunuen P Culbertson is a 70 y.o. female who presents to the clinic for evaluation of leukocytosis.   #Leukocytosis: --Strong suspicion for malignant process. No clinical signs/symptoms suggestive of infectous or inflammatory process. No current medication that can contribute to leukocytosis --Labs today to check CBC, CMP, LDH, sed rate, CRP, flow cytometry and BCR/ABL FISH --RTC in one week with labs and follow up with Dr. Federico to finalize diagnosis and treatment recommendations.   Orders Placed This Encounter  Procedures   CBC with Differential (Cancer Center Only)    Standing Status:   Future    Expiration Date:   09/30/2025   CMP (Cancer Center only)    Standing Status:   Future    Expiration Date:   09/30/2025   Flow Cytometry, Peripheral Blood (Oncology)    Standing Status:   Future    Expiration Date:   09/30/2025   Lactate dehydrogenase (LDH)    Standing Status:   Future     Expiration Date:   09/30/2025   Sedimentation rate    Standing Status:   Future    Expiration Date:   09/30/2025   C-reactive protein    Standing Status:   Future    Expiration Date:   09/30/2025   Uric acid    Standing Status:   Future    Expiration Date:   09/30/2025   BCR-ABL1 FISH    Standing Status:   Future    Expiration Date:   09/30/2025   Save Smear for Provider Slide Review    Standing Status:  Future    Expiration Date:   09/30/2025    All questions were answered. The patient knows to call the clinic with any problems, questions or concerns.  I have spent a total of 60 minutes minutes of face-to-face and non-face-to-face time, preparing to see the patient, obtaining and/or reviewing separately obtained history, performing a medically appropriate examination, counseling and educating the patient, ordering medications/tests/procedures,  documenting clinical information in the electronic health record, independently interpreting results and communicating results to the patient, and care coordination.   Johnston Police, PA-C Department of Hematology/Oncology [facility] Phone: [phone] "

## 2024-09-30 NOTE — Telephone Encounter (Signed)
 Received critical lab value page from [facility]   Critical WBC 119.0 from lab collected 09/29/24.   Spoke with on call Oncologist Dr Cloretta regarding patient results. Dr Cloretta advised patient does not need to emergently go to ED at this time and that the oncology team will set up follow up with her outpatient in ideally the next few days. Patient information secure chatted to Dr Cloretta. Additional lab work also previously collected and results are pending.   Plan to call patient in the morning to let her know of continued elevated WBC and plan for oncology follow up soon.  Routing this encounter to patient PCP Dr Anders.

## 2024-09-30 NOTE — Telephone Encounter (Signed)
 Responding to critical lab page received by [facility] pager day team. Spoke with [facility] who shared additional critical results of: ANC 72.6, LDH 1030 from 09/29/24 labs.   Will route this to patient PCP and Hem/Onc team. Appears new labs were drawn today 09/30/24 during patient's office visit with Hem/Onc. Greatly appreciate their care of this shared patient.

## 2024-09-30 NOTE — Telephone Encounter (Signed)
 Attempted to call patient, unable to reach, left generic voicemail informing I would be sending detailed MyChart message. MyChart message sent.

## 2024-10-01 ENCOUNTER — Other Ambulatory Visit (HOSPITAL_COMMUNITY): Payer: Self-pay

## 2024-10-01 ENCOUNTER — Telehealth: Payer: Self-pay | Admitting: *Deleted

## 2024-10-01 ENCOUNTER — Telehealth: Payer: Self-pay | Admitting: Hematology and Oncology

## 2024-10-01 LAB — CMP14+EGFR
ALT: 38 [IU]/L — ABNORMAL HIGH (ref 0–32)
AST: 29 [IU]/L (ref 0–40)
Albumin: 4.2 g/dL (ref 3.9–4.9)
Alkaline Phosphatase: 114 [IU]/L (ref 49–135)
BUN/Creatinine Ratio: 10 — ABNORMAL LOW (ref 12–28)
BUN: 8 mg/dL (ref 8–27)
Bilirubin Total: 0.3 mg/dL (ref 0.0–1.2)
CO2: 25 mmol/L (ref 20–29)
Calcium: 9.4 mg/dL (ref 8.7–10.3)
Chloride: 105 mmol/L (ref 96–106)
Creatinine, Ser: 0.84 mg/dL (ref 0.57–1.00)
Globulin, Total: 2.1 g/dL (ref 1.5–4.5)
Glucose: 86 mg/dL (ref 70–99)
Potassium: 3.9 mmol/L (ref 3.5–5.2)
Sodium: 144 mmol/L (ref 134–144)
Total Protein: 6.3 g/dL (ref 6.0–8.5)
eGFR: 77 mL/min/{1.73_m2}

## 2024-10-01 LAB — CBC WITH DIFFERENTIAL/PLATELET
Basophils Absolute: 3.6 10*3/uL — ABNORMAL HIGH (ref 0.0–0.2)
Basos: 3 %
EOS (ABSOLUTE): 3.6 10*3/uL — ABNORMAL HIGH (ref 0.0–0.4)
Eos: 3 %
Hematocrit: 33.5 % — ABNORMAL LOW (ref 34.0–46.6)
Hemoglobin: 10.7 g/dL — ABNORMAL LOW (ref 11.1–15.9)
Lymphocytes Absolute: 8.3 10*3/uL — ABNORMAL HIGH (ref 0.7–3.1)
Lymphs: 7 %
MCH: 28.9 pg (ref 26.6–33.0)
MCHC: 31.9 g/dL (ref 31.5–35.7)
MCV: 91 fL (ref 79–97)
Monocytes Absolute: 7.1 10*3/uL — ABNORMAL HIGH (ref 0.1–0.9)
Monocytes: 6 %
NRBC: 1 % — ABNORMAL HIGH (ref 0–0)
Neutrophils Absolute: 72.6 10*3/uL (ref 1.4–7.0)
Neutrophils: 61 %
Platelets: 330 10*3/uL (ref 150–450)
RBC: 3.7 x10E6/uL — ABNORMAL LOW (ref 3.77–5.28)
RDW: 14.7 % (ref 11.7–15.4)
WBC: 119 10*3/uL (ref 3.4–10.8)

## 2024-10-01 LAB — TRANSFERRIN SATURATION

## 2024-10-01 LAB — IRON,TIBC AND FERRITIN PANEL
Ferritin: 294 ng/mL — ABNORMAL HIGH (ref 15–150)
Iron Saturation: 20 % (ref 15–55)
Iron: 60 ug/dL (ref 27–139)
Total Iron Binding Capacity: 300 ug/dL (ref 250–450)
UIBC: 240 ug/dL (ref 118–369)

## 2024-10-01 LAB — VITAMIN D 25 HYDROXY (VIT D DEFICIENCY, FRACTURES): Vit D, 25-Hydroxy: 12.2 ng/mL — AB (ref 30.0–100.0)

## 2024-10-01 LAB — IMMATURE CELLS
Blasts/blast like cells: 2 % — ABNORMAL HIGH (ref 0–0)
MYELOCYTES: 5 % — ABNORMAL HIGH (ref 0–0)
Metamyelocytes: 13 % — ABNORMAL HIGH (ref 0–0)

## 2024-10-01 LAB — HEPATITIS B SURFACE ANTIBODY, QUANTITATIVE: Hepatitis B Surf Ab Quant: 2179 m[IU]/mL

## 2024-10-01 LAB — PATHOLOGIST SMEAR REVIEW

## 2024-10-01 LAB — SPECIMEN STATUS REPORT

## 2024-10-01 LAB — LACTATE DEHYDROGENASE: LDH: 1030 [IU]/L (ref 119–226)

## 2024-10-01 MED ORDER — VITAMIN D (ERGOCALCIFEROL) 1.25 MG (50000 UNIT) PO CAPS
50000.0000 [IU] | ORAL_CAPSULE | ORAL | 1 refills | Status: AC
  Filled 2024-10-01: qty 4, 28d supply, fill #0

## 2024-10-01 NOTE — Telephone Encounter (Signed)
 Received call from pt asking why she had to wait until next week to speak with Dr. Federico about her recent lab results. Advised that Dr. Federico will need all the results to be available for review and some of them will not be back until next week.Pt voiced understanding.

## 2024-10-01 NOTE — Telephone Encounter (Signed)
 gave pt a call to schedule next appt

## 2024-10-01 NOTE — Telephone Encounter (Signed)
 Confirmed if pt needed labs

## 2024-10-01 NOTE — Addendum Note (Signed)
 Addended by: ANDERS CUMINS T on: 10/01/2024 08:45 AM   Modules accepted: Orders

## 2024-10-01 NOTE — Telephone Encounter (Addendum)
 I discussed low Vit D level with her. Vit D supplement sent to the hospital.   In addition, she stated that oncology wondered why her WBC was not repeated in 2025 following elevation in 2024 during ED visits.  I reviewed with the patient that her prior WBC values--12K in March 2024 and 16K in August 2024--were obtained during ED visits for acute illnesses. These elevations are most consistent with reactive leukocytosis, which is common in the setting of infection, inflammation, physiologic stress, or dehydration. In such cases, routine outpatient WBC monitoring is generally not indicated unless the patient fails to clinically improve, develops new or worsening symptoms, or exhibits red-flag features such as persistent fever, tachycardia, or concerning exam findings. I also explained that if her WBC had been markedly elevated during those encounters, that would have prompted closer monitoring. She verbalized understanding and was appreciative of the call.

## 2024-10-02 LAB — FLOW CYTOMETRY

## 2024-10-02 LAB — SURGICAL PATHOLOGY

## 2024-10-05 ENCOUNTER — Ambulatory Visit (HOSPITAL_COMMUNITY)

## 2024-10-07 ENCOUNTER — Inpatient Hospital Stay

## 2024-10-07 ENCOUNTER — Inpatient Hospital Stay: Admitting: Hematology and Oncology

## 2024-10-09 ENCOUNTER — Ambulatory Visit: Admitting: Family Medicine
